# Patient Record
Sex: Female | Born: 1935 | Race: White | Hispanic: No | Marital: Married | Smoking: Never smoker
Health system: Southern US, Community
[De-identification: ages and names within clinical notes are randomized; demographics above are authoritative.]

## PROBLEM LIST (undated history)

## (undated) DIAGNOSIS — Z8679 Personal history of other diseases of the circulatory system: Secondary | ICD-10-CM

## (undated) DIAGNOSIS — M1710 Unilateral primary osteoarthritis, unspecified knee: Secondary | ICD-10-CM

## (undated) DIAGNOSIS — T451X5A Adverse effect of antineoplastic and immunosuppressive drugs, initial encounter: Secondary | ICD-10-CM

## (undated) DIAGNOSIS — Z86718 Personal history of other venous thrombosis and embolism: Secondary | ICD-10-CM

## (undated) DIAGNOSIS — M179 Osteoarthritis of knee, unspecified: Secondary | ICD-10-CM

## (undated) DIAGNOSIS — Z86010 Personal history of colonic polyps: Secondary | ICD-10-CM

## (undated) DIAGNOSIS — I10 Essential (primary) hypertension: Secondary | ICD-10-CM

## (undated) DIAGNOSIS — E785 Hyperlipidemia, unspecified: Secondary | ICD-10-CM

## (undated) DIAGNOSIS — I427 Cardiomyopathy due to drug and external agent: Secondary | ICD-10-CM

## (undated) DIAGNOSIS — I25710 Atherosclerosis of autologous vein coronary artery bypass graft(s) with unstable angina pectoris: Secondary | ICD-10-CM

## (undated) DIAGNOSIS — E039 Hypothyroidism, unspecified: Secondary | ICD-10-CM

## (undated) DIAGNOSIS — E1165 Type 2 diabetes mellitus with hyperglycemia: Secondary | ICD-10-CM

## (undated) DIAGNOSIS — Z853 Personal history of malignant neoplasm of breast: Secondary | ICD-10-CM

## (undated) DIAGNOSIS — I5042 Chronic combined systolic (congestive) and diastolic (congestive) heart failure: Secondary | ICD-10-CM

## (undated) DIAGNOSIS — Z8673 Personal history of transient ischemic attack (TIA), and cerebral infarction without residual deficits: Secondary | ICD-10-CM

## (undated) DIAGNOSIS — Z951 Presence of aortocoronary bypass graft: Secondary | ICD-10-CM

## (undated) DIAGNOSIS — I2510 Atherosclerotic heart disease of native coronary artery without angina pectoris: Secondary | ICD-10-CM

## (undated) DIAGNOSIS — Z9861 Coronary angioplasty status: Secondary | ICD-10-CM

## (undated) DIAGNOSIS — Z860101 Personal history of adenomatous and serrated colon polyps: Secondary | ICD-10-CM

## (undated) DIAGNOSIS — E118 Type 2 diabetes mellitus with unspecified complications: Secondary | ICD-10-CM

## (undated) DIAGNOSIS — F0390 Unspecified dementia without behavioral disturbance: Secondary | ICD-10-CM

## (undated) DIAGNOSIS — N182 Chronic kidney disease, stage 2 (mild): Secondary | ICD-10-CM

## (undated) DIAGNOSIS — IMO0002 Reserved for concepts with insufficient information to code with codable children: Secondary | ICD-10-CM

## (undated) HISTORY — DX: Cardiomyopathy due to drug and external agent: I42.7

## (undated) HISTORY — DX: Hypothyroidism, unspecified: E03.9

## (undated) HISTORY — DX: Presence of aortocoronary bypass graft: Z95.1

## (undated) HISTORY — DX: Osteoarthritis of knee, unspecified: M17.9

## (undated) HISTORY — PX: TOTAL KNEE ARTHROPLASTY: SHX125

## (undated) HISTORY — PX: LUMBAR DISC SURGERY: SHX700

## (undated) HISTORY — DX: Personal history of malignant neoplasm of breast: Z85.3

## (undated) HISTORY — DX: Personal history of other diseases of the circulatory system: Z86.79

## (undated) HISTORY — PX: TOTAL ABDOMINAL HYSTERECTOMY: SHX209

## (undated) HISTORY — DX: Hyperlipidemia, unspecified: E78.5

## (undated) HISTORY — DX: Personal history of colonic polyps: Z86.010

## (undated) HISTORY — DX: Personal history of adenomatous and serrated colon polyps: Z86.0101

## (undated) HISTORY — DX: Personal history of transient ischemic attack (TIA), and cerebral infarction without residual deficits: Z86.73

## (undated) HISTORY — DX: Chronic kidney disease, stage 2 (mild): N18.2

## (undated) HISTORY — DX: Unilateral primary osteoarthritis, unspecified knee: M17.10

## (undated) HISTORY — DX: Adverse effect of antineoplastic and immunosuppressive drugs, initial encounter: T45.1X5A

## (undated) HISTORY — DX: Personal history of other venous thrombosis and embolism: Z86.718

---

## 1998-07-08 ENCOUNTER — Ambulatory Visit (HOSPITAL_COMMUNITY): Admission: RE | Admit: 1998-07-08 | Discharge: 1998-07-08 | Payer: Self-pay | Admitting: Obstetrics and Gynecology

## 1998-07-10 ENCOUNTER — Other Ambulatory Visit: Admission: RE | Admit: 1998-07-10 | Discharge: 1998-07-10 | Payer: Self-pay | Admitting: Obstetrics and Gynecology

## 1998-11-21 HISTORY — PX: SIMPLE MASTECTOMY: SHX2409

## 1999-03-02 ENCOUNTER — Other Ambulatory Visit: Admission: RE | Admit: 1999-03-02 | Discharge: 1999-03-02 | Payer: Self-pay | Admitting: Obstetrics and Gynecology

## 1999-03-09 ENCOUNTER — Ambulatory Visit (HOSPITAL_COMMUNITY): Admission: RE | Admit: 1999-03-09 | Discharge: 1999-03-09 | Payer: Self-pay | Admitting: *Deleted

## 1999-04-02 ENCOUNTER — Ambulatory Visit (HOSPITAL_COMMUNITY): Admission: RE | Admit: 1999-04-02 | Discharge: 1999-04-02 | Payer: Self-pay | Admitting: Surgery

## 1999-04-14 ENCOUNTER — Encounter: Admission: RE | Admit: 1999-04-14 | Discharge: 1999-07-13 | Payer: Self-pay | Admitting: Radiation Oncology

## 1999-04-26 ENCOUNTER — Ambulatory Visit (HOSPITAL_BASED_OUTPATIENT_CLINIC_OR_DEPARTMENT_OTHER): Admission: RE | Admit: 1999-04-26 | Discharge: 1999-04-26 | Payer: Self-pay | Admitting: Surgery

## 1999-05-03 ENCOUNTER — Encounter: Payer: Self-pay | Admitting: Surgery

## 1999-05-03 ENCOUNTER — Ambulatory Visit (HOSPITAL_COMMUNITY): Admission: RE | Admit: 1999-05-03 | Discharge: 1999-05-03 | Payer: Self-pay | Admitting: Surgery

## 1999-11-01 ENCOUNTER — Encounter: Payer: Self-pay | Admitting: Surgery

## 1999-11-01 ENCOUNTER — Encounter: Admission: RE | Admit: 1999-11-01 | Discharge: 1999-11-01 | Payer: Self-pay | Admitting: Surgery

## 1999-11-13 ENCOUNTER — Encounter: Payer: Self-pay | Admitting: Surgery

## 1999-11-13 ENCOUNTER — Ambulatory Visit (HOSPITAL_COMMUNITY): Admission: RE | Admit: 1999-11-13 | Discharge: 1999-11-13 | Payer: Self-pay | Admitting: Surgery

## 2000-11-02 ENCOUNTER — Emergency Department (HOSPITAL_COMMUNITY): Admission: EM | Admit: 2000-11-02 | Discharge: 2000-11-03 | Payer: Self-pay | Admitting: Emergency Medicine

## 2000-11-03 ENCOUNTER — Encounter: Payer: Self-pay | Admitting: Emergency Medicine

## 2002-11-21 DIAGNOSIS — I2510 Atherosclerotic heart disease of native coronary artery without angina pectoris: Secondary | ICD-10-CM

## 2002-11-21 HISTORY — DX: Atherosclerotic heart disease of native coronary artery without angina pectoris: I25.10

## 2002-12-09 ENCOUNTER — Inpatient Hospital Stay (HOSPITAL_COMMUNITY): Admission: EM | Admit: 2002-12-09 | Discharge: 2002-12-17 | Payer: Self-pay | Admitting: Emergency Medicine

## 2002-12-09 ENCOUNTER — Encounter: Payer: Self-pay | Admitting: Emergency Medicine

## 2002-12-10 ENCOUNTER — Encounter: Payer: Self-pay | Admitting: Cardiology

## 2002-12-10 HISTORY — PX: CARDIAC CATHETERIZATION: SHX172

## 2002-12-14 ENCOUNTER — Encounter: Payer: Self-pay | Admitting: Internal Medicine

## 2002-12-22 ENCOUNTER — Encounter: Payer: Self-pay | Admitting: *Deleted

## 2002-12-22 ENCOUNTER — Inpatient Hospital Stay (HOSPITAL_COMMUNITY): Admission: EM | Admit: 2002-12-22 | Discharge: 2002-12-23 | Payer: Self-pay | Admitting: *Deleted

## 2003-01-14 ENCOUNTER — Ambulatory Visit (HOSPITAL_COMMUNITY): Admission: RE | Admit: 2003-01-14 | Discharge: 2003-01-16 | Payer: Self-pay | Admitting: Cardiology

## 2003-01-14 HISTORY — PX: CARDIAC CATHETERIZATION: SHX172

## 2003-06-22 DIAGNOSIS — Z951 Presence of aortocoronary bypass graft: Secondary | ICD-10-CM

## 2003-06-22 HISTORY — DX: Presence of aortocoronary bypass graft: Z95.1

## 2003-07-22 ENCOUNTER — Encounter: Payer: Self-pay | Admitting: Cardiothoracic Surgery

## 2003-07-22 HISTORY — PX: CARDIAC CATHETERIZATION: SHX172

## 2003-07-23 ENCOUNTER — Encounter: Payer: Self-pay | Admitting: Cardiothoracic Surgery

## 2003-07-23 ENCOUNTER — Inpatient Hospital Stay (HOSPITAL_COMMUNITY): Admission: AD | Admit: 2003-07-23 | Discharge: 2003-07-28 | Payer: Self-pay | Admitting: Cardiology

## 2003-07-23 DIAGNOSIS — Z86718 Personal history of other venous thrombosis and embolism: Secondary | ICD-10-CM

## 2003-07-23 HISTORY — PX: CORONARY ARTERY BYPASS GRAFT: SHX141

## 2003-07-23 HISTORY — DX: Personal history of other venous thrombosis and embolism: Z86.718

## 2003-07-24 ENCOUNTER — Encounter: Payer: Self-pay | Admitting: Cardiothoracic Surgery

## 2003-07-25 ENCOUNTER — Encounter: Payer: Self-pay | Admitting: Cardiothoracic Surgery

## 2003-07-26 ENCOUNTER — Encounter: Payer: Self-pay | Admitting: Cardiothoracic Surgery

## 2003-08-15 ENCOUNTER — Encounter: Admission: RE | Admit: 2003-08-15 | Discharge: 2003-08-15 | Payer: Self-pay | Admitting: Cardiothoracic Surgery

## 2003-08-15 ENCOUNTER — Encounter: Payer: Self-pay | Admitting: Cardiothoracic Surgery

## 2003-08-16 ENCOUNTER — Inpatient Hospital Stay (HOSPITAL_COMMUNITY): Admission: EM | Admit: 2003-08-16 | Discharge: 2003-08-17 | Payer: Self-pay | Admitting: Emergency Medicine

## 2004-04-12 ENCOUNTER — Ambulatory Visit (HOSPITAL_COMMUNITY): Admission: RE | Admit: 2004-04-12 | Discharge: 2004-04-12 | Payer: Self-pay | Admitting: Gastroenterology

## 2004-12-06 ENCOUNTER — Encounter: Admission: RE | Admit: 2004-12-06 | Discharge: 2004-12-06 | Payer: Self-pay | Admitting: Family Medicine

## 2004-12-29 ENCOUNTER — Inpatient Hospital Stay (HOSPITAL_COMMUNITY): Admission: RE | Admit: 2004-12-29 | Discharge: 2005-01-02 | Payer: Self-pay | Admitting: Orthopedic Surgery

## 2005-07-18 ENCOUNTER — Encounter: Admission: RE | Admit: 2005-07-18 | Discharge: 2005-07-18 | Payer: Self-pay | Admitting: Orthopedic Surgery

## 2005-11-23 ENCOUNTER — Inpatient Hospital Stay (HOSPITAL_COMMUNITY): Admission: RE | Admit: 2005-11-23 | Discharge: 2005-11-25 | Payer: Self-pay | Admitting: Orthopedic Surgery

## 2006-02-02 ENCOUNTER — Encounter: Admission: RE | Admit: 2006-02-02 | Discharge: 2006-02-02 | Payer: Self-pay | Admitting: Cardiology

## 2006-02-15 ENCOUNTER — Ambulatory Visit: Payer: Self-pay | Admitting: Emergency Medicine

## 2006-03-01 ENCOUNTER — Ambulatory Visit (HOSPITAL_COMMUNITY): Admission: RE | Admit: 2006-03-01 | Discharge: 2006-03-01 | Payer: Self-pay | Admitting: Cardiology

## 2006-03-01 HISTORY — PX: CARDIAC CATHETERIZATION: SHX172

## 2008-02-15 ENCOUNTER — Observation Stay (HOSPITAL_COMMUNITY): Admission: EM | Admit: 2008-02-15 | Discharge: 2008-02-16 | Payer: Self-pay | Admitting: Emergency Medicine

## 2008-02-19 HISTORY — PX: CARDIAC CATHETERIZATION: SHX172

## 2008-11-21 DIAGNOSIS — Z8679 Personal history of other diseases of the circulatory system: Secondary | ICD-10-CM

## 2008-11-21 HISTORY — DX: Personal history of other diseases of the circulatory system: Z86.79

## 2009-04-26 ENCOUNTER — Inpatient Hospital Stay (HOSPITAL_COMMUNITY): Admission: EM | Admit: 2009-04-26 | Discharge: 2009-04-27 | Payer: Self-pay | Admitting: Emergency Medicine

## 2009-04-27 ENCOUNTER — Encounter (INDEPENDENT_AMBULATORY_CARE_PROVIDER_SITE_OTHER): Payer: Self-pay | Admitting: Internal Medicine

## 2009-07-07 ENCOUNTER — Inpatient Hospital Stay (HOSPITAL_COMMUNITY): Admission: EM | Admit: 2009-07-07 | Discharge: 2009-07-08 | Payer: Self-pay | Admitting: Emergency Medicine

## 2009-07-22 DIAGNOSIS — I5042 Chronic combined systolic (congestive) and diastolic (congestive) heart failure: Secondary | ICD-10-CM

## 2009-07-22 DIAGNOSIS — T451X5A Adverse effect of antineoplastic and immunosuppressive drugs, initial encounter: Secondary | ICD-10-CM

## 2009-07-22 DIAGNOSIS — I427 Cardiomyopathy due to drug and external agent: Secondary | ICD-10-CM

## 2009-07-22 HISTORY — DX: Cardiomyopathy due to drug and external agent: I42.7

## 2009-07-22 HISTORY — DX: Chronic combined systolic (congestive) and diastolic (congestive) heart failure: I50.42

## 2009-07-22 HISTORY — DX: Adverse effect of antineoplastic and immunosuppressive drugs, initial encounter: T45.1X5A

## 2009-08-18 ENCOUNTER — Inpatient Hospital Stay (HOSPITAL_COMMUNITY): Admission: EM | Admit: 2009-08-18 | Discharge: 2009-08-20 | Payer: Self-pay | Admitting: Emergency Medicine

## 2009-08-19 HISTORY — PX: CARDIAC CATHETERIZATION: SHX172

## 2009-09-30 HISTORY — PX: CARDIOVASCULAR STRESS TEST: SHX262

## 2009-10-05 ENCOUNTER — Inpatient Hospital Stay (HOSPITAL_COMMUNITY): Admission: RE | Admit: 2009-10-05 | Discharge: 2009-10-06 | Payer: Self-pay | Admitting: Cardiovascular Disease

## 2009-10-05 HISTORY — PX: RENAL ARTERY ANGIOPLASTY: SHX2317

## 2009-12-11 ENCOUNTER — Inpatient Hospital Stay (HOSPITAL_COMMUNITY): Admission: EM | Admit: 2009-12-11 | Discharge: 2009-12-13 | Payer: Self-pay | Admitting: Emergency Medicine

## 2009-12-12 ENCOUNTER — Encounter (INDEPENDENT_AMBULATORY_CARE_PROVIDER_SITE_OTHER): Payer: Self-pay | Admitting: Internal Medicine

## 2010-03-03 ENCOUNTER — Inpatient Hospital Stay (HOSPITAL_COMMUNITY): Admission: RE | Admit: 2010-03-03 | Discharge: 2010-03-04 | Payer: Self-pay | Admitting: General Surgery

## 2010-03-03 ENCOUNTER — Encounter (INDEPENDENT_AMBULATORY_CARE_PROVIDER_SITE_OTHER): Payer: Self-pay | Admitting: General Surgery

## 2010-03-16 ENCOUNTER — Ambulatory Visit: Payer: Self-pay | Admitting: Oncology

## 2010-03-24 LAB — CBC WITH DIFFERENTIAL/PLATELET
Basophils Absolute: 0 10*3/uL (ref 0.0–0.1)
Eosinophils Absolute: 0.1 10*3/uL (ref 0.0–0.5)
HCT: 33.8 % — ABNORMAL LOW (ref 34.8–46.6)
HGB: 11.5 g/dL — ABNORMAL LOW (ref 11.6–15.9)
MCHC: 33.9 g/dL (ref 31.5–36.0)
MCV: 91.1 fL (ref 79.5–101.0)
MONO%: 9.1 % (ref 0.0–14.0)
NEUT%: 69.6 % (ref 38.4–76.8)
RBC: 3.72 10*6/uL (ref 3.70–5.45)
lymph#: 1.2 10*3/uL (ref 0.9–3.3)

## 2010-03-24 LAB — COMPREHENSIVE METABOLIC PANEL
ALT: 8 U/L (ref 0–35)
AST: 12 U/L (ref 0–37)
Alkaline Phosphatase: 83 U/L (ref 39–117)
Creatinine, Ser: 1.41 mg/dL — ABNORMAL HIGH (ref 0.40–1.20)
Sodium: 138 mEq/L (ref 135–145)
Total Bilirubin: 0.3 mg/dL (ref 0.3–1.2)
Total Protein: 7.1 g/dL (ref 6.0–8.3)

## 2010-03-30 ENCOUNTER — Ambulatory Visit (HOSPITAL_COMMUNITY): Admission: RE | Admit: 2010-03-30 | Discharge: 2010-03-30 | Payer: Self-pay | Admitting: Oncology

## 2010-04-07 ENCOUNTER — Ambulatory Visit (HOSPITAL_BASED_OUTPATIENT_CLINIC_OR_DEPARTMENT_OTHER): Admission: RE | Admit: 2010-04-07 | Discharge: 2010-04-07 | Payer: Self-pay | Admitting: General Surgery

## 2010-04-12 LAB — CBC WITH DIFFERENTIAL/PLATELET
BASO%: 0.7 % (ref 0.0–2.0)
HCT: 36.5 % (ref 34.8–46.6)
LYMPH%: 19.1 % (ref 14.0–49.7)
MCHC: 32.9 g/dL (ref 31.5–36.0)
MCV: 90.6 fL (ref 79.5–101.0)
MONO#: 0.6 10*3/uL (ref 0.1–0.9)
MONO%: 7.3 % (ref 0.0–14.0)
NEUT%: 71.1 % (ref 38.4–76.8)
Platelets: 188 10*3/uL (ref 145–400)
RBC: 4.03 10*6/uL (ref 3.70–5.45)
nRBC: 0 % (ref 0–0)

## 2010-04-12 LAB — COMPREHENSIVE METABOLIC PANEL
ALT: 8 U/L (ref 0–35)
AST: 13 U/L (ref 0–37)
BUN: 27 mg/dL — ABNORMAL HIGH (ref 6–23)
CO2: 18 mEq/L — ABNORMAL LOW (ref 19–32)
Calcium: 8.9 mg/dL (ref 8.4–10.5)
Chloride: 106 mEq/L (ref 96–112)
Creatinine, Ser: 1.25 mg/dL — ABNORMAL HIGH (ref 0.40–1.20)
Total Bilirubin: 0.3 mg/dL (ref 0.3–1.2)

## 2010-04-29 ENCOUNTER — Ambulatory Visit: Payer: Self-pay | Admitting: Oncology

## 2010-05-03 LAB — COMPREHENSIVE METABOLIC PANEL
AST: 11 U/L (ref 0–37)
Albumin: 3.9 g/dL (ref 3.5–5.2)
Alkaline Phosphatase: 95 U/L (ref 39–117)
Chloride: 109 mEq/L (ref 96–112)
Creatinine, Ser: 1.22 mg/dL — ABNORMAL HIGH (ref 0.40–1.20)
Glucose, Bld: 126 mg/dL — ABNORMAL HIGH (ref 70–99)
Sodium: 141 mEq/L (ref 135–145)
Total Bilirubin: 0.4 mg/dL (ref 0.3–1.2)
Total Protein: 6.7 g/dL (ref 6.0–8.3)

## 2010-05-03 LAB — CBC WITH DIFFERENTIAL/PLATELET
Basophils Absolute: 0.1 10*3/uL (ref 0.0–0.1)
HCT: 35.1 % (ref 34.8–46.6)
MCH: 29.9 pg (ref 25.1–34.0)
MCHC: 33.3 g/dL (ref 31.5–36.0)
MCV: 89.8 fL (ref 79.5–101.0)
NEUT#: 7.7 10*3/uL — ABNORMAL HIGH (ref 1.5–6.5)
NEUT%: 74.3 % (ref 38.4–76.8)
RBC: 3.91 10*6/uL (ref 3.70–5.45)
RDW: 15.3 % — ABNORMAL HIGH (ref 11.2–14.5)
lymph#: 1.5 10*3/uL (ref 0.9–3.3)
nRBC: 0 % (ref 0–0)

## 2010-05-03 LAB — MAGNESIUM: Magnesium: 1.6 mg/dL (ref 1.5–2.5)

## 2010-05-26 LAB — MAGNESIUM: Magnesium: 1.8 mg/dL (ref 1.5–2.5)

## 2010-05-26 LAB — CBC WITH DIFFERENTIAL/PLATELET
BASO%: 0.8 % (ref 0.0–2.0)
Basophils Absolute: 0.1 10*3/uL (ref 0.0–0.1)
HCT: 33.1 % — ABNORMAL LOW (ref 34.8–46.6)
HGB: 10.8 g/dL — ABNORMAL LOW (ref 11.6–15.9)
MCH: 29.9 pg (ref 25.1–34.0)
MCV: 91.7 fL (ref 79.5–101.0)
MONO#: 0.9 10*3/uL (ref 0.1–0.9)
Platelets: 283 10*3/uL (ref 145–400)
RDW: 16.7 % — ABNORMAL HIGH (ref 11.2–14.5)
WBC: 10.1 10*3/uL (ref 3.9–10.3)

## 2010-05-26 LAB — COMPREHENSIVE METABOLIC PANEL
ALT: 9 U/L (ref 0–35)
AST: 12 U/L (ref 0–37)
Albumin: 4 g/dL (ref 3.5–5.2)
Alkaline Phosphatase: 98 U/L (ref 39–117)
Calcium: 9 mg/dL (ref 8.4–10.5)
Potassium: 4.4 mEq/L (ref 3.5–5.3)
Total Bilirubin: 0.4 mg/dL (ref 0.3–1.2)
Total Protein: 6.6 g/dL (ref 6.0–8.3)

## 2010-06-14 ENCOUNTER — Ambulatory Visit: Payer: Self-pay | Admitting: Oncology

## 2010-06-16 LAB — CBC WITH DIFFERENTIAL/PLATELET
BASO%: 0.4 % (ref 0.0–2.0)
EOS%: 0.4 % (ref 0.0–7.0)
HCT: 31.1 % — ABNORMAL LOW (ref 34.8–46.6)
LYMPH%: 11 % — ABNORMAL LOW (ref 14.0–49.7)
MCH: 29.3 pg (ref 25.1–34.0)
MCHC: 31.8 g/dL (ref 31.5–36.0)
MCV: 92 fL (ref 79.5–101.0)
MONO%: 8.9 % (ref 0.0–14.0)
NEUT#: 7.5 10*3/uL — ABNORMAL HIGH (ref 1.5–6.5)
Platelets: 231 10*3/uL (ref 145–400)
RBC: 3.38 10*6/uL — ABNORMAL LOW (ref 3.70–5.45)

## 2010-06-16 LAB — COMPREHENSIVE METABOLIC PANEL
Alkaline Phosphatase: 96 U/L (ref 39–117)
BUN: 19 mg/dL (ref 6–23)
CO2: 19 mEq/L (ref 19–32)
Creatinine, Ser: 1.19 mg/dL (ref 0.40–1.20)
Potassium: 4.6 mEq/L (ref 3.5–5.3)
Sodium: 142 mEq/L (ref 135–145)
Total Bilirubin: 0.4 mg/dL (ref 0.3–1.2)

## 2010-07-07 LAB — CBC WITH DIFFERENTIAL/PLATELET
Basophils Absolute: 0.1 10*3/uL (ref 0.0–0.1)
EOS%: 0.5 % (ref 0.0–7.0)
HGB: 10.2 g/dL — ABNORMAL LOW (ref 11.6–15.9)
LYMPH%: 13.3 % — ABNORMAL LOW (ref 14.0–49.7)
MCH: 29.9 pg (ref 25.1–34.0)
MCV: 95.3 fL (ref 79.5–101.0)
MONO%: 11 % (ref 0.0–14.0)
NEUT#: 6 10*3/uL (ref 1.5–6.5)
NEUT%: 74.6 % (ref 38.4–76.8)
RBC: 3.41 10*6/uL — ABNORMAL LOW (ref 3.70–5.45)
RDW: 17.1 % — ABNORMAL HIGH (ref 11.2–14.5)
lymph#: 1.1 10*3/uL (ref 0.9–3.3)

## 2010-07-23 ENCOUNTER — Ambulatory Visit: Payer: Self-pay | Admitting: Oncology

## 2010-07-28 LAB — COMPREHENSIVE METABOLIC PANEL
Albumin: 4.1 g/dL (ref 3.5–5.2)
CO2: 22 mEq/L (ref 19–32)
Potassium: 4.6 mEq/L (ref 3.5–5.3)
Sodium: 141 mEq/L (ref 135–145)

## 2010-07-28 LAB — CBC WITH DIFFERENTIAL/PLATELET
BASO%: 0.3 % (ref 0.0–2.0)
Basophils Absolute: 0 10*3/uL (ref 0.0–0.1)
EOS%: 3 % (ref 0.0–7.0)
HCT: 33.7 % — ABNORMAL LOW (ref 34.8–46.6)
MCV: 93.4 fL (ref 79.5–101.0)
NEUT#: 5.4 10*3/uL (ref 1.5–6.5)
NEUT%: 74.5 % (ref 38.4–76.8)
WBC: 7.3 10*3/uL (ref 3.9–10.3)
lymph#: 1.1 10*3/uL (ref 0.9–3.3)
nRBC: 0 % (ref 0–0)

## 2010-08-18 LAB — CBC WITH DIFFERENTIAL/PLATELET
Eosinophils Absolute: 0.1 10*3/uL (ref 0.0–0.5)
HCT: 32.5 % — ABNORMAL LOW (ref 34.8–46.6)
HGB: 10.4 g/dL — ABNORMAL LOW (ref 11.6–15.9)
MCH: 29.8 pg (ref 25.1–34.0)
MCV: 93.1 fL (ref 79.5–101.0)
MONO#: 0.9 10*3/uL (ref 0.1–0.9)
NEUT%: 77.1 % — ABNORMAL HIGH (ref 38.4–76.8)
WBC: 9.4 10*3/uL (ref 3.9–10.3)
lymph#: 1.2 10*3/uL (ref 0.9–3.3)

## 2010-08-18 LAB — COMPREHENSIVE METABOLIC PANEL
ALT: 10 U/L (ref 0–35)
Alkaline Phosphatase: 91 U/L (ref 39–117)
BUN: 17 mg/dL (ref 6–23)
CO2: 20 mEq/L (ref 19–32)
Calcium: 8.9 mg/dL (ref 8.4–10.5)
Chloride: 113 mEq/L — ABNORMAL HIGH (ref 96–112)
Glucose, Bld: 99 mg/dL (ref 70–99)
Potassium: 4.9 mEq/L (ref 3.5–5.3)
Sodium: 142 mEq/L (ref 135–145)
Total Bilirubin: 0.3 mg/dL (ref 0.3–1.2)

## 2010-08-31 ENCOUNTER — Ambulatory Visit: Admission: RE | Admit: 2010-08-31 | Discharge: 2010-08-31 | Payer: Self-pay | Admitting: Oncology

## 2010-09-06 ENCOUNTER — Ambulatory Visit: Payer: Self-pay | Admitting: Oncology

## 2010-09-08 LAB — COMPREHENSIVE METABOLIC PANEL
ALT: 8 U/L (ref 0–35)
AST: 12 U/L (ref 0–37)
Albumin: 4 g/dL (ref 3.5–5.2)
Alkaline Phosphatase: 94 U/L (ref 39–117)
Calcium: 9.5 mg/dL (ref 8.4–10.5)
Creatinine, Ser: 1.17 mg/dL (ref 0.40–1.20)
Glucose, Bld: 124 mg/dL — ABNORMAL HIGH (ref 70–99)
Sodium: 141 mEq/L (ref 135–145)
Total Bilirubin: 0.4 mg/dL (ref 0.3–1.2)
Total Protein: 6.1 g/dL (ref 6.0–8.3)

## 2010-09-08 LAB — CBC WITH DIFFERENTIAL/PLATELET
HCT: 29.5 % — ABNORMAL LOW (ref 34.8–46.6)
MCH: 31.5 pg (ref 25.1–34.0)
MCV: 91.1 fL (ref 79.5–101.0)
MONO#: 0.8 10*3/uL (ref 0.1–0.9)
NEUT#: 6 10*3/uL (ref 1.5–6.5)
Platelets: 266 10*3/uL (ref 145–400)
RBC: 3.24 10*6/uL — ABNORMAL LOW (ref 3.70–5.45)
RDW: 16.3 % — ABNORMAL HIGH (ref 11.2–14.5)

## 2010-09-29 ENCOUNTER — Encounter (INDEPENDENT_AMBULATORY_CARE_PROVIDER_SITE_OTHER): Payer: Self-pay | Admitting: General Surgery

## 2010-09-29 ENCOUNTER — Ambulatory Visit (HOSPITAL_COMMUNITY): Admission: RE | Admit: 2010-09-29 | Discharge: 2010-09-30 | Payer: Self-pay | Admitting: General Surgery

## 2010-10-21 ENCOUNTER — Ambulatory Visit: Payer: Self-pay | Admitting: Oncology

## 2010-10-22 LAB — COMPREHENSIVE METABOLIC PANEL
ALT: 13 U/L (ref 0–35)
BUN: 22 mg/dL (ref 6–23)
CO2: 24 mEq/L (ref 19–32)
Calcium: 8.6 mg/dL (ref 8.4–10.5)
Chloride: 105 mEq/L (ref 96–112)
Creatinine, Ser: 1.16 mg/dL (ref 0.40–1.20)
Glucose, Bld: 190 mg/dL — ABNORMAL HIGH (ref 70–99)
Total Bilirubin: 0.4 mg/dL (ref 0.3–1.2)

## 2010-10-22 LAB — CBC WITH DIFFERENTIAL/PLATELET
Basophils Absolute: 0 10*3/uL (ref 0.0–0.1)
Eosinophils Absolute: 0.1 10*3/uL (ref 0.0–0.5)
HCT: 34.1 % — ABNORMAL LOW (ref 34.8–46.6)
HGB: 10.9 g/dL — ABNORMAL LOW (ref 11.6–15.9)
LYMPH%: 13.9 % — ABNORMAL LOW (ref 14.0–49.7)
MONO#: 0.5 10*3/uL (ref 0.1–0.9)
NEUT%: 76.4 % (ref 38.4–76.8)
Platelets: 209 10*3/uL (ref 145–400)
WBC: 6.1 10*3/uL (ref 3.9–10.3)
lymph#: 0.8 10*3/uL — ABNORMAL LOW (ref 0.9–3.3)

## 2010-10-22 LAB — MAGNESIUM: Magnesium: 1.6 mg/dL (ref 1.5–2.5)

## 2010-11-19 ENCOUNTER — Emergency Department (HOSPITAL_COMMUNITY)
Admission: EM | Admit: 2010-11-19 | Discharge: 2010-11-19 | Payer: Self-pay | Source: Home / Self Care | Admitting: Emergency Medicine

## 2010-11-25 ENCOUNTER — Ambulatory Visit (HOSPITAL_COMMUNITY)
Admission: RE | Admit: 2010-11-25 | Discharge: 2010-11-25 | Payer: Self-pay | Source: Home / Self Care | Attending: Cardiology | Admitting: Cardiology

## 2010-11-25 HISTORY — PX: CARDIAC CATHETERIZATION: SHX172

## 2010-11-25 LAB — GLUCOSE, CAPILLARY
Glucose-Capillary: 159 mg/dL — ABNORMAL HIGH (ref 70–99)
Glucose-Capillary: 126 mg/dL — ABNORMAL HIGH (ref 70–99)
Glucose-Capillary: 102 mg/dL — ABNORMAL HIGH (ref 70–99)
Glucose-Capillary: 246 mg/dL — ABNORMAL HIGH (ref 70–99)

## 2010-12-09 NOTE — Procedures (Signed)
[name], [name]                ACCOUNT NO.:  [id number]  MEDICAL RECORD NO.:  [id number]          PATIENT TYPE:  OIB  LOCATION:  2899                         FACILITY:  [facility]  PHYSICIAN:  [name], M.D. DATE OF BIRTH:  February 28, 1935  DATE OF PROCEDURE:  11/25/2010 DATE OF DISCHARGE:  11/25/2010                           CARDIAC CATHETERIZATION   INDICATIONS FOR TEST:  This 76 year old female has had bypass surgery and she has had a recent mastectomy and treatment with Adriamycin for breast cancer.  The treatment was terminated prematurely because of drop in the ejection fraction.  She had seen me on several occasions since mid November because of profound tiredness and fatigue and some atypical chest pain that sounded more surgical related but then presented to the emergency room 5 days ago with recurrent chest pain.  Her cardiac markers were negative and she was brought in today for outpatient cardiac catheterization with graft visualization.  After obtaining informed consent, the patient was prepped and draped in the usual sterile fashion exposing the right groin.  Following local anesthetic with 1% Xylocaine, the Seldinger technique was employed.  A 5- French introducer sheath was placed in the right femoral artery.  Left and right coronary arteriography graft visualization and ventriculography in the RAO projection was performed.  Her creatinine was 1.6.  She has a history of mild renal insufficiency and because of this minimal contrast was used.  I did, however, perform a ventriculogram to try to resolve the LV function issue.  EQUIPMENT:  A 5-French Judkins configuration catheters.  TOTAL CONTRAST USED:  100 mL.  COMPLICATIONS:  None.  RESULTS: 1. Central aortic pressure 137/61.  Left ventricular pressure 139/7     with no aortic valve gradient at the time of pullback. 2. Ventriculography.  Ventriculography in the RAO projection using 20     mL of contrast at  12 mL per second revealed the left ventricle to     be slightly dilated.  The anterior wall and the distal inferior     wall were hypokinetic.  The apex was underfilled but did have good     contractility.  Her left ventricular end-diastolic pressure was 10     and her ejection fraction was calculated at 40%. 3. Coronary arteriography: 4. Left main normal and bifurcated. 5. Circumflex.  This is a non grafted vessel that was small.  It had     mild proximal and mid irregularities and gave rise to a very small     OMs. 6. LAD.  There is a stent of proximal portion of the LAD.  There is     eccentric 90% narrowing of the proximal portion of the stent with     brisk distal flow and retrograde flow up the internal mammary     artery.  The distal LAD was small in diameter.  In 2010, her cath     showed this stent to be 100% occluded.  It has recanalized on its     own. 7. Right coronary artery.  The proximal third of the right coronary     artery was diffusely  diseased with 60, 70, and 80% areas of     narrowing.  The distal right, the PDA, and posterolateral vessels     were free of disease and there was retrograde filling into the     saphenous vein graft to the RCA.  GRAFTS: 1. Saphenous vein graft to the distal RCA had proximal and ostial     irregularities with a valve in the proximal portion of the graft.     The remainder of the graft was free of disease.  The distal right,     the PDA, and posterolateral vessels were all free of disease and     there was some frank right-to-left collaterals noted. 2. Saphenous vein graft to the diagonal.  The vein graft was patent.     The diagonal was small but free of disease. 3. Internal mammary artery to the LAD.  The LAD was widely patent.  It     inserted into the mid LAD just distal to the stent.  The mid and     distal LAD was small but no high-grade stenoses.  CONCLUSION: 1. 40% ejection fraction with anterior and distal inferior wall  motion     abnormalities.  In 2010, her ejection fraction was normal and I     assume this is Adriamycin induced. 2. Patent grafts. 3. Progression of diagonal and LAD disease, predominantly small vessel     disease consistent with her diabetes.  Despite the high-grade stenosis in the proximal portion of the LAD that is within a stent.  She has excellent flow down her internal mammary artery.  The stent had been closed previously and I do not think that attempts of opening this stent up would make any change in her symptoms and may in fact damage her internal mammary artery graft.  She will be discharged to home today.  I will follow her up in the office in about 10 days.          ______________________________ [name], M.D.     ABL/MEDQ  D:  11/25/2010  T:  11/26/2010  Job:  [id number]  cc:   Crystal Herbert, MD Cath Lab  Electronically Signed by [name] M.D. on 12/09/2010 02:32:08 PM

## 2011-01-25 ENCOUNTER — Encounter (HOSPITAL_BASED_OUTPATIENT_CLINIC_OR_DEPARTMENT_OTHER): Payer: Medicare Other | Admitting: Oncology

## 2011-01-25 ENCOUNTER — Other Ambulatory Visit: Payer: Self-pay | Admitting: Medical

## 2011-01-25 DIAGNOSIS — E039 Hypothyroidism, unspecified: Secondary | ICD-10-CM

## 2011-01-25 DIAGNOSIS — E119 Type 2 diabetes mellitus without complications: Secondary | ICD-10-CM

## 2011-01-25 DIAGNOSIS — C50419 Malignant neoplasm of upper-outer quadrant of unspecified female breast: Secondary | ICD-10-CM

## 2011-01-25 LAB — CBC WITH DIFFERENTIAL/PLATELET
Basophils Absolute: 0 10*3/uL (ref 0.0–0.1)
EOS%: 2.7 % (ref 0.0–7.0)
HGB: 10.9 g/dL — ABNORMAL LOW (ref 11.6–15.9)
MCH: 30.7 pg (ref 25.1–34.0)
MONO#: 0.6 10*3/uL (ref 0.1–0.9)
NEUT#: 3.8 10*3/uL (ref 1.5–6.5)
RDW: 15.3 % — ABNORMAL HIGH (ref 11.2–14.5)
WBC: 5.8 10*3/uL (ref 3.9–10.3)
lymph#: 1.2 10*3/uL (ref 0.9–3.3)

## 2011-01-25 LAB — COMPREHENSIVE METABOLIC PANEL
ALT: 10 U/L (ref 0–35)
AST: 12 U/L (ref 0–37)
Albumin: 4.2 g/dL (ref 3.5–5.2)
BUN: 22 mg/dL (ref 6–23)
CO2: 20 mEq/L (ref 19–32)
Calcium: 9.6 mg/dL (ref 8.4–10.5)
Chloride: 110 mEq/L (ref 96–112)
Potassium: 5.6 mEq/L — ABNORMAL HIGH (ref 3.5–5.3)

## 2011-01-31 LAB — URINALYSIS, ROUTINE W REFLEX MICROSCOPIC
Bilirubin Urine: NEGATIVE
Ketones, ur: NEGATIVE mg/dL
Nitrite: NEGATIVE
Specific Gravity, Urine: 1.011 (ref 1.005–1.030)
Urobilinogen, UA: 0.2 mg/dL (ref 0.0–1.0)

## 2011-01-31 LAB — COMPREHENSIVE METABOLIC PANEL
BUN: 33 mg/dL — ABNORMAL HIGH (ref 6–23)
CO2: 24 mEq/L (ref 19–32)
Calcium: 9.9 mg/dL (ref 8.4–10.5)
Creatinine, Ser: 1.63 mg/dL — ABNORMAL HIGH (ref 0.4–1.2)
GFR calc non Af Amer: 31 mL/min — ABNORMAL LOW (ref >60)
Glucose, Bld: 112 mg/dL — ABNORMAL HIGH (ref 70–99)
Total Protein: 7.1 g/dL (ref 6.0–8.3)

## 2011-01-31 LAB — DIFFERENTIAL
Eosinophils Absolute: 0.1 10*3/uL (ref 0.0–0.7)
Lymphocytes Relative: 16 % (ref 12–46)
Lymphs Abs: 1.1 10*3/uL (ref 0.7–4.0)
Monocytes Relative: 7 % (ref 3–12)
Neutro Abs: 5.4 10*3/uL (ref 1.7–7.7)
Neutrophils Relative %: 75 % (ref 43–77)

## 2011-01-31 LAB — APTT: aPTT: 26 seconds (ref 24–37)

## 2011-01-31 LAB — CBC
HCT: 36.1 % (ref 36.0–46.0)
Hemoglobin: 11.8 g/dL — ABNORMAL LOW (ref 12.0–15.0)
MCH: 30 pg (ref 26.0–34.0)
MCHC: 32.7 g/dL (ref 30.0–36.0)
RDW: 14.9 % (ref 11.5–15.5)

## 2011-01-31 LAB — PROTIME-INR
INR: 0.97 (ref 0.00–1.49)
Prothrombin Time: 13.1 seconds (ref 11.6–15.2)

## 2011-01-31 LAB — CK TOTAL AND CKMB (NOT AT ARMC)
Relative Index: INVALID (ref 0.0–2.5)
Total CK: 61 U/L (ref 7–177)
Total CK: 66 U/L (ref 7–177)

## 2011-01-31 LAB — BRAIN NATRIURETIC PEPTIDE: Pro B Natriuretic peptide (BNP): 78 pg/mL (ref 0.0–100.0)

## 2011-02-01 LAB — BASIC METABOLIC PANEL
CO2: 26 mEq/L (ref 19–32)
Calcium: 9.8 mg/dL (ref 8.4–10.5)
GFR calc Af Amer: 42 mL/min — ABNORMAL LOW (ref >60)
Potassium: 4.9 mEq/L (ref 3.5–5.1)
Sodium: 141 mEq/L (ref 135–145)

## 2011-02-01 LAB — CBC
HCT: 34.8 % — ABNORMAL LOW (ref 36.0–46.0)
Hemoglobin: 11.6 g/dL — ABNORMAL LOW (ref 12.0–15.0)
MCH: 30 pg (ref 26.0–34.0)
RBC: 3.85 MIL/uL — ABNORMAL LOW (ref 3.87–5.11)

## 2011-02-01 LAB — GLUCOSE, CAPILLARY
Glucose-Capillary: 102 mg/dL — ABNORMAL HIGH (ref 70–99)
Glucose-Capillary: 102 mg/dL — ABNORMAL HIGH (ref 70–99)
Glucose-Capillary: 116 mg/dL — ABNORMAL HIGH (ref 70–99)
Glucose-Capillary: 171 mg/dL — ABNORMAL HIGH (ref 70–99)
Glucose-Capillary: 58 mg/dL — ABNORMAL LOW (ref 70–99)
Glucose-Capillary: 82 mg/dL (ref 70–99)

## 2011-02-01 LAB — DIFFERENTIAL
Lymphs Abs: 1.1 10*3/uL (ref 0.7–4.0)
Monocytes Relative: 8 % (ref 3–12)
Neutro Abs: 5.5 10*3/uL (ref 1.7–7.7)
Neutrophils Relative %: 74 % (ref 43–77)

## 2011-02-01 LAB — SURGICAL PCR SCREEN: MRSA, PCR: NEGATIVE

## 2011-02-06 LAB — BASIC METABOLIC PANEL WITH GFR
Calcium: 9.5 mg/dL (ref 8.4–10.5)
Glucose, Bld: 231 mg/dL — ABNORMAL HIGH (ref 70–99)
Potassium: 4.3 meq/L (ref 3.5–5.1)
Sodium: 138 meq/L (ref 135–145)

## 2011-02-06 LAB — CBC
HCT: 37.5 % (ref 36.0–46.0)
Hemoglobin: 12.8 g/dL (ref 12.0–15.0)
Hemoglobin: 13 g/dL (ref 12.0–15.0)
MCHC: 34 g/dL (ref 30.0–36.0)
MCHC: 34.7 g/dL (ref 30.0–36.0)
MCV: 92.1 fL (ref 78.0–100.0)
Platelets: 200 K/uL (ref 150–400)
RBC: 4.07 MIL/uL (ref 3.87–5.11)
RBC: 4.08 MIL/uL (ref 3.87–5.11)
RDW: 12.9 % (ref 11.5–15.5)
WBC: 6.2 K/uL (ref 4.0–10.5)

## 2011-02-06 LAB — BASIC METABOLIC PANEL
BUN: 19 mg/dL (ref 6–23)
BUN: 30 mg/dL — ABNORMAL HIGH (ref 6–23)
CO2: 20 mEq/L (ref 19–32)
CO2: 22 mEq/L (ref 19–32)
Calcium: 8.5 mg/dL (ref 8.4–10.5)
Chloride: 106 mEq/L (ref 96–112)
Creatinine, Ser: 1.22 mg/dL — ABNORMAL HIGH (ref 0.4–1.2)
Creatinine, Ser: 1.4 mg/dL — ABNORMAL HIGH (ref 0.4–1.2)
GFR calc Af Amer: 52 mL/min — ABNORMAL LOW (ref >60)

## 2011-02-06 LAB — COMPREHENSIVE METABOLIC PANEL
ALT: 14 U/L (ref 0–35)
Alkaline Phosphatase: 60 U/L (ref 39–117)
CO2: 22 mEq/L (ref 19–32)
GFR calc non Af Amer: 45 mL/min — ABNORMAL LOW (ref >60)
Glucose, Bld: 128 mg/dL — ABNORMAL HIGH (ref 70–99)
Potassium: 4.4 mEq/L (ref 3.5–5.1)
Sodium: 144 mEq/L (ref 135–145)
Total Bilirubin: 0.7 mg/dL (ref 0.3–1.2)

## 2011-02-06 LAB — URINALYSIS, ROUTINE W REFLEX MICROSCOPIC
Bilirubin Urine: NEGATIVE
Glucose, UA: 250 mg/dL — AB
Hgb urine dipstick: NEGATIVE
Ketones, ur: NEGATIVE mg/dL
Nitrite: NEGATIVE
Protein, ur: NEGATIVE mg/dL
Specific Gravity, Urine: 1.013 (ref 1.005–1.030)
Urobilinogen, UA: 0.2 mg/dL (ref 0.0–1.0)
pH: 5 (ref 5.0–8.0)

## 2011-02-06 LAB — APTT
aPTT: 26 seconds (ref 24–37)
aPTT: 27 s (ref 24–37)

## 2011-02-06 LAB — CK TOTAL AND CKMB (NOT AT ARMC)
CK, MB: 5.5 ng/mL — ABNORMAL HIGH (ref 0.3–4.0)
Relative Index: INVALID (ref 0.0–2.5)
Total CK: 82 U/L (ref 7–177)

## 2011-02-06 LAB — DIFFERENTIAL
Basophils Absolute: 0.1 10*3/uL (ref 0.0–0.1)
Basophils Relative: 1 % (ref 0–1)
Eosinophils Absolute: 0.1 K/uL (ref 0.0–0.7)
Eosinophils Relative: 2 % (ref 0–5)
Lymphocytes Relative: 22 % (ref 12–46)
Lymphs Abs: 1.4 K/uL (ref 0.7–4.0)
Monocytes Absolute: 0.5 K/uL (ref 0.1–1.0)
Monocytes Relative: 8 % (ref 3–12)
Neutro Abs: 4.2 K/uL (ref 1.7–7.7)
Neutrophils Relative %: 67 % (ref 43–77)

## 2011-02-06 LAB — GLUCOSE, CAPILLARY
Glucose-Capillary: 121 mg/dL — ABNORMAL HIGH (ref 70–99)
Glucose-Capillary: 122 mg/dL — ABNORMAL HIGH (ref 70–99)
Glucose-Capillary: 122 mg/dL — ABNORMAL HIGH (ref 70–99)
Glucose-Capillary: 123 mg/dL — ABNORMAL HIGH (ref 70–99)
Glucose-Capillary: 123 mg/dL — ABNORMAL HIGH (ref 70–99)
Glucose-Capillary: 132 mg/dL — ABNORMAL HIGH (ref 70–99)
Glucose-Capillary: 145 mg/dL — ABNORMAL HIGH (ref 70–99)
Glucose-Capillary: 149 mg/dL — ABNORMAL HIGH (ref 70–99)
Glucose-Capillary: 202 mg/dL — ABNORMAL HIGH (ref 70–99)
Glucose-Capillary: 330 mg/dL — ABNORMAL HIGH (ref 70–99)

## 2011-02-06 LAB — HEMOGLOBIN A1C: Hgb A1c MFr Bld: 8.1 % — ABNORMAL HIGH (ref 4.6–6.1)

## 2011-02-06 LAB — PROTIME-INR
INR: 0.97 (ref 0.00–1.49)
Prothrombin Time: 12.4 seconds (ref 11.6–15.2)
Prothrombin Time: 12.8 seconds (ref 11.6–15.2)

## 2011-02-06 LAB — TROPONIN I: Troponin I: 0.03 ng/mL (ref 0.00–0.06)

## 2011-02-06 LAB — LIPID PANEL: VLDL: 26 mg/dL (ref 0–40)

## 2011-02-07 LAB — GLUCOSE, CAPILLARY: Glucose-Capillary: 119 mg/dL — ABNORMAL HIGH (ref 70–99)

## 2011-02-07 LAB — POCT HEMOGLOBIN-HEMACUE: Hemoglobin: 12.5 g/dL (ref 12.0–15.0)

## 2011-02-08 LAB — CBC
Hemoglobin: 11.1 g/dL — ABNORMAL LOW (ref 12.0–15.0)
MCV: 90.7 fL (ref 78.0–100.0)
RBC: 3.55 MIL/uL — ABNORMAL LOW (ref 3.87–5.11)
WBC: 5.9 10*3/uL (ref 4.0–10.5)

## 2011-02-08 LAB — DIFFERENTIAL
Eosinophils Absolute: 0.1 10*3/uL (ref 0.0–0.7)
Lymphs Abs: 1.2 10*3/uL (ref 0.7–4.0)
Monocytes Absolute: 0.5 10*3/uL (ref 0.1–1.0)
Monocytes Relative: 8 % (ref 3–12)
Neutrophils Relative %: 69 % (ref 43–77)

## 2011-02-08 LAB — BASIC METABOLIC PANEL
CO2: 24 mEq/L (ref 19–32)
Chloride: 108 mEq/L (ref 96–112)
Creatinine, Ser: 1.42 mg/dL — ABNORMAL HIGH (ref 0.4–1.2)
GFR calc Af Amer: 44 mL/min — ABNORMAL LOW (ref >60)
Potassium: 5.3 mEq/L — ABNORMAL HIGH (ref 3.5–5.1)
Sodium: 139 mEq/L (ref 135–145)

## 2011-02-08 LAB — PROTIME-INR: INR: 0.93 (ref 0.00–1.49)

## 2011-02-08 LAB — APTT: aPTT: 27 seconds (ref 24–37)

## 2011-02-09 LAB — DIFFERENTIAL
Basophils Absolute: 0.1 10*3/uL (ref 0.0–0.1)
Basophils Relative: 1 % (ref 0–1)
Eosinophils Absolute: 0.1 10*3/uL (ref 0.0–0.7)
Eosinophils Relative: 1 % (ref 0–5)
Monocytes Absolute: 0.5 10*3/uL (ref 0.1–1.0)

## 2011-02-09 LAB — COMPREHENSIVE METABOLIC PANEL
ALT: 15 U/L (ref 0–35)
AST: 18 U/L (ref 0–37)
Albumin: 3.6 g/dL (ref 3.5–5.2)
Alkaline Phosphatase: 72 U/L (ref 39–117)
Chloride: 105 mEq/L (ref 96–112)
GFR calc Af Amer: 48 mL/min — ABNORMAL LOW (ref >60)
Potassium: 4.3 mEq/L (ref 3.5–5.1)
Total Bilirubin: 0.6 mg/dL (ref 0.3–1.2)

## 2011-02-09 LAB — GLUCOSE, CAPILLARY
Glucose-Capillary: 187 mg/dL — ABNORMAL HIGH (ref 70–99)
Glucose-Capillary: 330 mg/dL — ABNORMAL HIGH (ref 70–99)

## 2011-02-09 LAB — CANCER ANTIGEN 27.29: CA 27.29: 18 U/mL (ref 0–39)

## 2011-02-09 LAB — CBC
Platelets: 189 10*3/uL (ref 150–400)
RBC: 3.84 MIL/uL — ABNORMAL LOW (ref 3.87–5.11)
WBC: 5.7 10*3/uL (ref 4.0–10.5)

## 2011-02-23 LAB — CBC
HCT: 30.5 % — ABNORMAL LOW (ref 36.0–46.0)
MCV: 94.5 fL (ref 78.0–100.0)
Platelets: 167 10*3/uL (ref 150–400)
WBC: 6 10*3/uL (ref 4.0–10.5)

## 2011-02-23 LAB — BASIC METABOLIC PANEL
BUN: 19 mg/dL (ref 6–23)
Chloride: 106 mEq/L (ref 96–112)
Potassium: 4 mEq/L (ref 3.5–5.1)

## 2011-02-23 LAB — GLUCOSE, CAPILLARY
Glucose-Capillary: 105 mg/dL — ABNORMAL HIGH (ref 70–99)
Glucose-Capillary: 232 mg/dL — ABNORMAL HIGH (ref 70–99)

## 2011-02-25 LAB — GLUCOSE, CAPILLARY
Glucose-Capillary: 153 mg/dL — ABNORMAL HIGH (ref 70–99)
Glucose-Capillary: 170 mg/dL — ABNORMAL HIGH (ref 70–99)
Glucose-Capillary: 211 mg/dL — ABNORMAL HIGH (ref 70–99)

## 2011-02-25 LAB — LIPID PANEL
LDL Cholesterol: 158 mg/dL — ABNORMAL HIGH (ref 0–99)
Total CHOL/HDL Ratio: 5 RATIO
Triglycerides: 123 mg/dL (ref <150)
VLDL: 25 mg/dL (ref 0–40)

## 2011-02-25 LAB — COMPREHENSIVE METABOLIC PANEL
ALT: 16 U/L (ref 0–35)
AST: 22 U/L (ref 0–37)
CO2: 23 mEq/L (ref 19–32)
Chloride: 109 mEq/L (ref 96–112)
GFR calc Af Amer: 45 mL/min — ABNORMAL LOW (ref >60)
GFR calc non Af Amer: 37 mL/min — ABNORMAL LOW (ref >60)
Potassium: 4.3 mEq/L (ref 3.5–5.1)
Sodium: 140 mEq/L (ref 135–145)
Total Bilirubin: 0.6 mg/dL (ref 0.3–1.2)

## 2011-02-25 LAB — CARDIAC PANEL(CRET KIN+CKTOT+MB+TROPI)
CK, MB: 10.9 ng/mL — ABNORMAL HIGH (ref 0.3–4.0)
CK, MB: 53.4 ng/mL — ABNORMAL HIGH (ref 0.3–4.0)
Relative Index: 11.9 — ABNORMAL HIGH (ref 0.0–2.5)
Total CK: 229 U/L — ABNORMAL HIGH (ref 7–177)
Troponin I: 11.81 ng/mL (ref 0.00–0.06)
Troponin I: 6.77 ng/mL (ref 0.00–0.06)

## 2011-02-25 LAB — BRAIN NATRIURETIC PEPTIDE: Pro B Natriuretic peptide (BNP): 75 pg/mL (ref 0.0–100.0)

## 2011-02-25 LAB — POCT CARDIAC MARKERS
CKMB, poc: 2 ng/mL (ref 1.0–8.0)
Myoglobin, poc: 292 ng/mL (ref 12–200)
Troponin i, poc: 0.09 ng/mL (ref 0.00–0.09)

## 2011-02-25 LAB — BASIC METABOLIC PANEL
BUN: 10 mg/dL (ref 6–23)
Chloride: 105 mEq/L (ref 96–112)
Potassium: 4.1 mEq/L (ref 3.5–5.1)
Sodium: 139 mEq/L (ref 135–145)

## 2011-02-25 LAB — CBC
HCT: 37.6 % (ref 36.0–46.0)
MCHC: 34.5 g/dL (ref 30.0–36.0)
MCV: 94.5 fL (ref 78.0–100.0)
Platelets: 192 10*3/uL (ref 150–400)
RBC: 3.68 MIL/uL — ABNORMAL LOW (ref 3.87–5.11)
RDW: 13.7 % (ref 11.5–15.5)
WBC: 6.9 10*3/uL (ref 4.0–10.5)

## 2011-02-25 LAB — DIFFERENTIAL
Eosinophils Relative: 1 % (ref 0–5)
Lymphocytes Relative: 16 % (ref 12–46)
Lymphs Abs: 1.1 10*3/uL (ref 0.7–4.0)
Monocytes Absolute: 0.5 10*3/uL (ref 0.1–1.0)

## 2011-02-26 LAB — URINALYSIS, ROUTINE W REFLEX MICROSCOPIC
Bilirubin Urine: NEGATIVE
Glucose, UA: NEGATIVE mg/dL
Hgb urine dipstick: NEGATIVE
Ketones, ur: NEGATIVE mg/dL
Protein, ur: NEGATIVE mg/dL
Urobilinogen, UA: 0.2 mg/dL (ref 0.0–1.0)

## 2011-02-26 LAB — DIFFERENTIAL
Basophils Absolute: 0 10*3/uL (ref 0.0–0.1)
Basophils Relative: 1 % (ref 0–1)
Eosinophils Relative: 2 % (ref 0–5)
Monocytes Absolute: 0.6 10*3/uL (ref 0.1–1.0)
Monocytes Relative: 11 % (ref 3–12)
Neutro Abs: 3.1 10*3/uL (ref 1.7–7.7)

## 2011-02-26 LAB — GLUCOSE, CAPILLARY
Glucose-Capillary: 135 mg/dL — ABNORMAL HIGH (ref 70–99)
Glucose-Capillary: 151 mg/dL — ABNORMAL HIGH (ref 70–99)
Glucose-Capillary: 160 mg/dL — ABNORMAL HIGH (ref 70–99)
Glucose-Capillary: 166 mg/dL — ABNORMAL HIGH (ref 70–99)

## 2011-02-26 LAB — SEDIMENTATION RATE: Sed Rate: 14 mm/hr (ref 0–22)

## 2011-02-26 LAB — BASIC METABOLIC PANEL
CO2: 26 mEq/L (ref 19–32)
Chloride: 110 mEq/L (ref 96–112)
GFR calc Af Amer: 55 mL/min — ABNORMAL LOW (ref >60)
Glucose, Bld: 129 mg/dL — ABNORMAL HIGH (ref 70–99)
Potassium: 4.2 mEq/L (ref 3.5–5.1)
Sodium: 141 mEq/L (ref 135–145)

## 2011-02-26 LAB — CBC
HCT: 36.7 % (ref 36.0–46.0)
Hemoglobin: 12.7 g/dL (ref 12.0–15.0)
MCHC: 34.6 g/dL (ref 30.0–36.0)
RBC: 3.91 MIL/uL (ref 3.87–5.11)
RDW: 13.1 % (ref 11.5–15.5)

## 2011-02-26 LAB — HEMOGLOBIN A1C: Mean Plasma Glucose: 171 mg/dL

## 2011-02-26 LAB — TSH: TSH: 4.332 u[IU]/mL (ref 0.350–4.500)

## 2011-02-26 LAB — APTT: aPTT: 24 seconds (ref 24–37)

## 2011-02-28 LAB — CARDIAC PANEL(CRET KIN+CKTOT+MB+TROPI)
Relative Index: INVALID (ref 0.0–2.5)
Total CK: 98 U/L (ref 7–177)

## 2011-02-28 LAB — URINALYSIS, ROUTINE W REFLEX MICROSCOPIC
Bilirubin Urine: NEGATIVE
Ketones, ur: 15 mg/dL — AB
Nitrite: NEGATIVE
pH: 6.5 (ref 5.0–8.0)

## 2011-02-28 LAB — COMPREHENSIVE METABOLIC PANEL
ALT: 16 U/L (ref 0–35)
AST: 22 U/L (ref 0–37)
Albumin: 3.6 g/dL (ref 3.5–5.2)
Alkaline Phosphatase: 77 U/L (ref 39–117)
Chloride: 104 mEq/L (ref 96–112)
GFR calc Af Amer: 57 mL/min — ABNORMAL LOW (ref >60)
Potassium: 4.3 mEq/L (ref 3.5–5.1)
Total Bilirubin: 0.7 mg/dL (ref 0.3–1.2)

## 2011-02-28 LAB — URINE MICROSCOPIC-ADD ON

## 2011-02-28 LAB — BASIC METABOLIC PANEL
BUN: 15 mg/dL (ref 6–23)
CO2: 26 mEq/L (ref 19–32)
Chloride: 111 mEq/L (ref 96–112)
Glucose, Bld: 146 mg/dL — ABNORMAL HIGH (ref 70–99)
Potassium: 4.1 mEq/L (ref 3.5–5.1)

## 2011-02-28 LAB — LIPID PANEL
Cholesterol: 175 mg/dL (ref 0–200)
HDL: 28 mg/dL — ABNORMAL LOW (ref >39)
Triglycerides: 118 mg/dL (ref <150)

## 2011-02-28 LAB — GLUCOSE, CAPILLARY: Glucose-Capillary: 161 mg/dL — ABNORMAL HIGH (ref 70–99)

## 2011-02-28 LAB — DIFFERENTIAL
Basophils Absolute: 0 10*3/uL (ref 0.0–0.1)
Basophils Relative: 0 % (ref 0–1)
Eosinophils Relative: 0 % (ref 0–5)
Monocytes Absolute: 0.3 10*3/uL (ref 0.1–1.0)

## 2011-02-28 LAB — CBC
MCV: 92 fL (ref 78.0–100.0)
Platelets: 165 10*3/uL (ref 150–400)
RBC: 3.66 MIL/uL — ABNORMAL LOW (ref 3.87–5.11)
WBC: 3.8 10*3/uL — ABNORMAL LOW (ref 4.0–10.5)
WBC: 5.8 10*3/uL (ref 4.0–10.5)

## 2011-02-28 LAB — TSH: TSH: 0.93 u[IU]/mL (ref 0.350–4.500)

## 2011-02-28 LAB — POCT CARDIAC MARKERS
CKMB, poc: <1 ng/mL — ABNORMAL LOW (ref 1.0–8.0)
Troponin i, poc: <0.05 ng/mL (ref 0.00–0.09)

## 2011-02-28 LAB — HEMOGLOBIN A1C: Mean Plasma Glucose: 174 mg/dL

## 2011-02-28 LAB — SEDIMENTATION RATE: Sed Rate: 30 mm/hr — ABNORMAL HIGH (ref 0–22)

## 2011-04-05 NOTE — H&P (Signed)
[name], [name]                 ACCOUNT NO.:  [id number]   MEDICAL RECORD NO.:  [id number]          PATIENT TYPE:  EMS   LOCATION:  MAJO                         FACILITY:  [facility]   PHYSICIAN:  Eduard Clos, MDDATE OF BIRTH:  06-05-1935   DATE OF ADMISSION:  04/26/2009  DATE OF DISCHARGE:                              HISTORY & PHYSICAL   PRIMARY CARE PHYSICIAN:  Dr. Anna Genre at [facility].   CHIEF COMPLAINT:  Difficulty talking.   HISTORY OF PRESENT ILLNESS:  A 76 year old female known history of CAD  status post CABG, status post stenting, hypertension, hyperlipidemia,  diabetes mellitus type 2, woke up today early in the morning at 4:00  with frontal headache and expressive aphasia.  The symptoms were  persistent.  They called EMS around 10 a.m. and was brought in to ER.  The patient did not have any focal deficits.  The headache was  persistent.  CT head was done, which did not show any acute findings.  At present, the patient has been admitted for further workup for  possible CVA.   The patient denies any loss of consciousness, loss of any function of  upper or lower extremities.  Denies any dizziness, chest pain, abdominal  pain, nausea or vomiting, or diarrhea.  History is also obtained with  the help of the patient's family and neighbor.   The patient's family states that she is getting more confused at times  recently, and last week she was very weak and taken to her doctor at  [facility], who felt that she may be dehydrated.  There was concern for her  renal function at that time.   PAST MEDICAL HISTORY:  Hypertension.  History of cancer breast status  post mastectomy 9 years ago on the left side.  CAD status post CABG.  Status post stenting.  Hypertension.  Hyperlipidemia.  Diabetes mellitus  type 2.   PAST SURGICAL HISTORY:  CABG, stenting, left-sided mastectomy for CA  breast.   MEDICATIONS ON ADMISSION:  1. Synthroid 88 mcg p.o. daily.  2. Metformin 1000 mg  p.o. b.i.d. and 500 mg at lunch.  3. Aspirin 81 mg p.o. b.i.d.  4. Prilosec 20 mg p.o. daily.  5. Isosorbide mononitrate 50 mg p.o. daily.  6. Nitroglycerin p.r.n. chest pain.   ALLERGIES:  No known drug allergies.   SOCIAL HISTORY:  The patient denies smoking cigarettes, drinking  alcohol, or using illegal drugs.   FAMILY HISTORY:  Nothing contributory.   REVIEW OF SYSTEMS:  As in the history of present illness.  Nothing else  significant.   PHYSICAL EXAMINATION:  CONSTITUTIONAL:  The patient examined at bedside,  not in acute distress.  VITAL SIGNS:  Blood pressure 128/106, pulse 77 per minute, temperature  98.2, respirations 18 per minute, O2 saturation 97%.  HEENT:  Anicteric, no pallor.  Tongue midline.  Face normal facial  asymmetry.  No neck rigidity.  CHEST:  Bilateral air entry present.  No rhonchi.  No crepitus.  HEART:  S1, S2 heard.  ABDOMEN:  Soft, nontender.  Bowel sounds heard.  CNS:  Awake.  Has difficulty expressing words.  Able to understand and  follows commands.  Moves upper and lower extremities 5/5.  Grossly  intact.  Facial features as in HEENT.  EXTREMITIES:  Peripheral pulses present.  No edema.   LABORATORY STUDIES:  Chest x-ray nothing acute.  CT of the head shows no  acute findings.  Small vessel ischemic changes.  CBC, WBC 5.8,  hemoglobin 13.7, hematocrit 39, platelets 165,000, neutrophils 86%.  Basic metabolic panel sodium 140, potassium 4.3, chloride 104, carbon  dioxide 27, glucose 207, BUN 15, creatinine 1.1, alkaline phosphatase  77, AST 22, ALT 16, total protein 7.2, albumin 3.6, calcium 9.3, CK-MB  less than 1, troponin I less than 0.05.  UA is negative for nitrites and  leukocytes, ketones 15, glucose 250.  Anion gap 9.   ASSESSMENT:  1. Expressive aphasia, possible cerebrovascular accident.  2. Headache, possibly from sinusitis.  3. Hypertension.  4. Diabetes mellitus type 2.  5. Hypothyroidism.  6. Coronary artery disease status  post CABG status post stenting.  7. History of cancer breast status post left-sided mastectomy 9 years      ago.   PLAN:  1. Admit the patient to telemetry.  2. Place the patient on neuro checks.  3. Aspirin after bedside swallow.  4. Will get an MRI of the brain, MRA brain, 2D echo, carotid and      transcranial Doppler, and will cycle cardiac markers, place the      patient on KVO.  5. Will get a neurology consult, check sed rate, and will also start      on Avelox for possible upper respiratory infection, and tramadol      for headache and further recommendations as the condition evolves.      Eduard Clos, MD  Electronically Signed     ANK/MEDQ  D:  04/26/2009  T:  04/26/2009  Job:  [id number]

## 2011-04-05 NOTE — H&P (Signed)
NAMETOCARA, MENNEN                 ACCOUNT NO.:  [id number]   MEDICAL RECORD NO.:  [id number]          PATIENT TYPE:  INP   LOCATION:  1826                         FACILITY:  [facility]   PHYSICIAN:  Cherene Altes, MD     DATE OF BIRTH:  January 01, 1935   DATE OF ADMISSION:  07/06/2009  DATE OF DISCHARGE:                              HISTORY & PHYSICAL   REASON FOR ADMISSION:  TIA versus complex migraine.   HISTORY OF PRESENT ILLNESS:  Ms. Schmelzer is a 76 year old white female  with a past medical history of migraine headache, coronary artery  disease, status post 3-vessel CABG in 2004, diabetes, and  hypothyroidism, who was brought to the emergency room by her family  earlier tonight for some confusion and difficulty with word-finding.  Patient states that she felt bad for the last 2 days; however,  described this mostly as a generalized malaise and not anything  specific.  This afternoon, she described some confusion and states that  she noted that she was having a hard time getting her words out.  Her  family is not present at this time but did state to the emergency  department physician that she was having some difficulty with word-  finding.  Patient denies any focal weakness, numbness, or tingling of  any extremities or loss of consciousness.  The patient states that she  had a stroke 8 to 9 weeks ago but is unclear on the details of that.   At present, the patient states she feels weak but very, very tired.  She  is otherwise without complaint.   REVIEW OF SYSTEMS:  Patient denies fever, nausea, vomiting, diarrhea, or  constipation.  No chest pain, shortness of breath, or urinary symptoms.  All other systems are reviewed and are negative except as described in  the HPI.   PAST MEDICAL HISTORY:  1. Coronary artery disease, status post 3-vessel CABG in 2004.  2. Diabetes.  3. Hypothyroidism.  4. History of migraine.  5. TIA in June, 2010.   SOCIAL HISTORY:  Denies tobacco,  alcohol, or drug use.   MEDICATIONS:  1. Metformin 500 mg daily.  2. Levothyroxine 88 mcg daily.  3. Aspirin 325 mg daily.  4. Vitamin B12, unknown dose.   ALLERGIES:  No known drug allergies.   PHYSICAL EXAMINATION:  VITAL SIGNS:  Blood pressure 149/59, respiratory  rate 20, O2 sat 100% on room air.  Pulse 90.  GENERAL:  A pleasant, well-appearing 76 year old white female asleep on  arrival; however, easily arousable and alert and oriented x3.  No acute  distress.  HEENT:  Oropharynx clear.  Mucous membranes moist.  NECK:  Normal JVP.  CARDIOVASCULAR:  Regular rate and rhythm.  No murmurs, rubs or gallops.  LUNGS:  Clear to auscultation bilaterally.  ABDOMEN:  Benign.  EXTREMITIES:  No edema.  Pulses 2+ DP pulses bilaterally.  NEURO:  Cranial nerves II-XII are intact.  Strength 5/5 in all 4  extremities.  Normal sensation globally.   LABS:  CBC with diff, UA, and coags within normal limits.  A BNP is  significant for a glucose of 129 and a creatinine of 1.17.  It is  otherwise normal.   CT of the head shows no intracranial hemorrhage.  No acute infarct.   EKG:  Normal sinus rhythm.  Nonspecific T wave changes in the  anterolateral and inferior leads.  Left anterior fascicular block.   IMPRESSION/PLAN:  1. Transient ischemic attack versus migraine headache:  Ms. Vanscyoc      complains of 2 days of general malaise.  She had some confusion and      difficulty with word-finding earlier this afternoon and states this      evening, she developed a left-sided occipital headache.  The      headache is consistent with her previous migraine and is relieved      after treatment with Zofran, morphine, and Compazine in the      emergency department.  The patient did have a transient ischemic      attack at the beginning of June but had a negative MRI at that time      for cerebrovascular accident.  We will admit the patient to the      hospital for observation, repeat an MRI/MRA of her  brain, and based      on the results, make a decision about further neurologic workup.  2. Hypothyroidism:  Will continue the patient's Synthroid 88 mcg      daily.  TSH is pending.  3. Diabetes:  The patient only states she is on 500 mg of Metformin a      day at present.  Review of her discharge on June 7th shows that she      was supposed to be on Metformin 1000 mg p.o. b.i.d.  We will obtain      a hemoglobin A1C and hold Metformin while in-house pending the need      for any contrast procedures.  We will treat the patient with a      sliding-scale insulin in the interim.  4. Coronary artery disease, status post 3-vessel coronary artery      bypass graft in 2004.  Patient has no signs or symptoms of active      ischemia at this time.  She is on aspirin 325 mg at home.  She      denies taking Imdur 15 mg daily or Zocor 20 mg nightly.  Her blood      pressure is mildly elevated in the emergency department.  She was      treated with 5 mg of Norvasc.  We will put the patient back on her      Imdur and Zocor and monitor her and make a decision about the      addition of further medications based on blood pressure and      cardiovascular symptoms.      Cherene Altes, MD  Electronically Signed     PS/MEDQ  D:  07/07/2009  T:  07/07/2009  Job:  [id number]

## 2011-04-05 NOTE — Discharge Summary (Signed)
[name], [name]                 ACCOUNT NO.:  [id number]   MEDICAL RECORD NO.:  [id number]          PATIENT TYPE:  INP   LOCATION:  3741                         FACILITY:  [facility]   PHYSICIAN:  Charlestine Massed, MDDATE OF BIRTH:  13-Aug-1935   DATE OF ADMISSION:  04/26/2009  DATE OF DISCHARGE:  04/27/2009                               DISCHARGE SUMMARY   PRIMARY CARE PHYSICIAN:  Dr. Anna Genre at [facility].   NEUROLOGY:  Pramod P. Pearlean Brownie, MD at [facility].   REASON FOR ADMISSION:  Expressive aphasia.   DISCHARGE DIAGNOSES:  1. Transient ischemic attack with expressive aphasia which is      completely resolved.  2. Prior history of coronary artery disease, currently stable.  3. Diabetes mellitus, currently sugar is controlled.  4. Hypertension, blood pressure stable.  5. Hyperthyroidism.  6. Acute bronchitis.  7. History of breast cancer status post mastectomy 9 years ago, no      further issues.  8. Dyslipidemia.   DISCHARGE MEDICATIONS:  1. Synthroid 88 mcg p.o. daily.  2. Metformin 1000 mg p.o. b.i.d. and 500 mg p.o. at lunch.  3. Aspirin 325 mg p.o. daily.  4. Prilosec 20 mg p.o. daily.  5. Imdur 15 mg p.o. daily.  6. Nitroglycerin 0.4 mg sublingual p.r.n. for chest pain up to 3 doses      in 20 minutes.  7. Zocor 20 mg p.o. nightly.  8. Z-Pak 1 Dosepak, to follow instructions on the pack.   HOSPITAL COURSE BY PROBLEM:  1. Expressive aphasia/transient ischemic attack:  The patient was      admitted yesterday on April 26, 2009, for complaints of sudden      development of frontal headache and expressive aphasia.  She did      not have any other focal deficits.  The headache was persistent.      She said that the headache woke her up from sleep.  The patient did      not lose any consciousness.  No definite neurological deficits      seen, otherwise, in the motor system, and there was no dizziness or      abdominal pain, no nausea, vomiting, diarrhea.  As per the  family,      she has had some episodes of confusion at times.  The patient did      not have any other symptoms today.   The patient was seen by Neurology, and total stroke evaluation was done.  MRI was done, which revealed no evidence of any stroke.  Carotid artery  Dopplers and arterial Dopplers showed no evidence of a significant  stenosis.  Echocardiogram revealed no evidence of any cardiac embolic  source, no aortic stenosis, and only mild grade 1 diastolic dysfunction.  Lab works revealed evidence of dyslipidemia, for which she has been  advised diet modification, as well as medications have been prescribed.  A Neurology followup was done and opined the patient can be discharged  as there is total resolution of symptoms at this time and continue MR  aspirin and follow up with Neurology as needed,  so the patient is being  discharged back home.  1. Coronary artery disease, currently stable:  Continue Imdur and      aspirin 325 mg.  As per the previous list available here, the      patient is not on any beta-blockers and the reason is still clearly      not known whether she had any significant bradycardia or not is      still not known, so I am not starting any beta-blockers at this      time as there is no indication for that currently and as her      coronary artery disease is currently stable.  2. Type 2 diabetes mellitus:  Continue metformin and at this time, she      will be followed up with a primary doctor as an outpatient.  3. Dyslipidemia:  To follow up with PMD in another 4 weeks for check      of liver function tests.  The patient cannot be started on a statin      because she is intolerant to STATIN, and she is taking garlic for      that, so we will start her on Zetia for that, and we will inform      the patient about that.  4. Acute bronchitis:  The patient has been having cough and minimal      sputum production for the first 4-5 days.  She was given Avelox       from 2 days.  We will continue her with Z-Pak as outpatient and no      further treatment needed.  Chest x-ray did not reveal evidence of      any patches at this time.   DISPOSITION:  Discharged back home.   FOLLOWUP:  Follow up with primary care doctor in another 3 or 4 weeks as  needed.  Follow up with Neurology in a month's time.   DISCHARGE INSTRUCTIONS:  1. Exercise, fall precautions.  2. Adhere to diet and medications.  3. A total of 40 minutes spent on this discharge.      Charlestine Massed, MD  Electronically Signed     UT/MEDQ  D:  04/27/2009  T:  04/28/2009  Job:  [id number]   cc:   Pramod P. Pearlean Brownie, MD

## 2011-04-05 NOTE — Consult Note (Signed)
[name], [name]                 ACCOUNT NO.:  [id number]   MEDICAL RECORD NO.:  [id number]          PATIENT TYPE:  INP   LOCATION:  3025                         FACILITY:  [facility]   PHYSICIAN:  Marlan Palau, M.D.  DATE OF BIRTH:  11-26-1934   DATE OF CONSULTATION:  07/07/2009  DATE OF DISCHARGE:                                 CONSULTATION   CHIEF COMPLAINT:  Questionable a aphasia, questionable TIA versus  migraine headache.   HISTORY OF PRESENT ILLNESS:  This is a 76 year old female with a history  of migraine headaches, questionable TIA in June 2010, CAD, hypertension,  and hyperlipidemia.  The patient was recently seen in 2010 by Dr.  Roseanne Reno for questionable TIAs.  At that time, stroke workup returned  negative.  The patient had a normal carotid ultrasound, normal 2-D echo  and MRI was negative.  On July 07, 2009, the patient was brought to  the emergency room secondary to daughter noting word difficulty and  confusion.  According to her daughter 2-3 days prior to admission, the  patient was feeling malaised and lethargic.  On the evening of July 07, 2009, the patient was noted having difficulty getting her words to  form sentences.  The primary care physician was contacted and the  patient's family was recommended to bring the patient to emergency room.  At the emergency room, the patient's blood glucose was 129, blood  pressure was 156/100, temperature was 98.6.  The patient was admitted to  the hospital and an MRI of brain was obtained.  MRI showed negative for  acute stroke.  The patient felt back to baseline the next morning and  was scheduled be discharged.  The patient, however, started to have a  left occipital headache that progressed.  Left occipital headache became  severe enough that neurology was consulted.  Daughter states that she  does have a history of migraine headaches and over the past few months,  most of her aphasic and confusional spells for which  she has been seen  in the hospital are usually accompanied by a headache.  The patient is  on no prophylactic treatment for her headache.  The patient's normal  migraine headache is bilateral frontal headaches and are accompanied by  photophobia.   At present time, the patient is lying supine in her bed with a towel  over her head.  The patient is complaining of severe headache in the  left occipital region along with photophobia.  The patient denies any  nuchal rigidity, any numbness or tingling, paresthesia or inability to  move upper or lower extremities.  The patient has been experiencing  nausea and vomiting, however, this is secondary to the morphine she was  given in the emergency room.  Upon further questioning, the patient  stated that in the past when she was a child, she had migraines quite  frequently, however, cannot recall a migraine headache in the past few  months.   PAST MEDICAL HISTORY:  Questionable TIA, significant for migraine  hypertension, hyperlipidemia, CAD with three-vessel CABG in 2004,  diabetes,  hypothyroidism, prior hospitalization in June 2010 with  bilateral frontal headache, aphasia, right-sided weakness and confusion  and breast cancer status mastectomy and radiation.  Stroke risk factors  include hypertension, diabetes, hyperlipidemia, CAD.   MEDICATIONS:  The patient takes metformin 500 mg daily, levothyroxine 88  mcg daily, aspirin 325 mg daily and B12.   ALLERGIES:  Negative.   FAMILY HISTORY:  Positive for hypertension and cancer.   SOCIAL HISTORY:  The patient lives with her husband, is very close to  her daughter does not smoke, drink or do illicit drugs.   REVIEW OF SYSTEMS:  Review of systems are negative with the exception of  above.   PHYSICAL EXAMINATION:  GENERAL:  Temperature is 98.0, blood pressure is  103/56 her 854, respirations 18, O2 sat 100%.  LUNGS:  Clear to auscultation bilaterally.  NECK:  Supple and negative for  bruits.  CARDIOVASCULAR:  Regular rate and rhythm.  No rubs, gallops or murmurs.  NEUROLOGICAL EXAM:  Face symmetrical.  Tongue is midline.  Pupils equal,  round, reactive, accommodating to light.  Extraocular muscles are  intact.  Neck is supple.  The patient moves all extremities.  Full range  of motion, 5/5 strength.  Finger-to-nose and heel-to-shin is both smooth  and within normal limits.  Rapid alternating movements and fine and  finger are within normal limits.  Sensation is intact to pinprick, light  touch, vibration, and proprioception.  The patient has bilateral  downgoing toes.  Speech is clear and well enunciated.  No drift is noted  bilaterally upper or lower extremities.  Gait was not tested at present  time.   LABORATORY DATA:  Sodium is 141, potassium 4.2, chloride 110, CO2 26,  BUN 18, creatinine 1.17, glucose 129 and white blood cell count 5.3,  platelets 194, hemoglobin/hematocrit 12.7 and 36.7.  PT is 12.3, INR is  0.9, PTT is 24.  HbA1c is pending.  UA is negative.  Cholesterol 175,  triglycerides 118, HDL 28, LDL is 123.  EKG shows normal sinus rhythm.   CT/MRI:  MRI of the brain shows chronic small vessel disease with no  acute process.  MRA of the brain shows no stenosis or occlusion within  the anterior circulation and shows bilateral posterior PCA stenosis.   ASSESSMENT/PLAN:  This is a 76 year old female with what most likely is  migraine variant with past medical history significant for migraine  headaches.  Will treat the patient with Depacon 1000 mg IV over 10  minutes now.  Check ESR.  Will follow tomorrow.   Thank you very much for this consultation.     ______________________________  Crystal Morn, PA-C      C. Lesia Sago, M.D.  Electronically Signed    DS/MEDQ  D:  07/07/2009  T:  07/07/2009  Job:  [id number]

## 2011-04-05 NOTE — Discharge Summary (Signed)
NAMEVENORA, [name]                 ACCOUNT NO.:  [id number]   MEDICAL RECORD NO.:  [id number]           PATIENT TYPE:   LOCATION:                                 FACILITY:   PHYSICIAN:  Monte Fantasia, MD  DATE OF BIRTH:  Nov 13, 1935   DATE OF ADMISSION:  07/07/2009  DATE OF DISCHARGE:                               DISCHARGE SUMMARY   PRIMARY CARE PHYSICIAN:  Dr. Lonie Peak, office number [phone].   DISCHARGE DIAGNOSES:  1. Transient ischemic attack.  2. Hypothyroidism.  3. Diabetes.  4. Coronary artery disease, status post coronary artery bypass graft      in 2004.  5. Hypertension.  6. Migraine headache.   MEDICATIONS UPON DISCHARGE:  1. Levothyroxine 88 mcg p.o. daily.  2. Aspirin 325 mg p.o. daily.  3. Metformin 500 mg p.o. daily.  4. Imdur 15 mg p.o. daily.  5. Zocor 20 mg p.o. daily.   COURSE DURING THE HOSPITAL STAY:  [name], a pleasant 76 year old  Caucasian lady patient was admitted on July 07, 2009, with complaints  of mild confusion and difficulty in word finding with generalized  malaise.  The patient was admitted to rule out transient ischemic attack  and was admitted and had CT of the head without contrast on admission,  which showed no acute intracranial findings.  MRI and MRA of the head  was negative for any acute or reversible findings or vessel occlusions.  The patient was also evaluated by Neurology and as per the assessment  was more likely secondary to migraine headaches and was given Depacon  1000 mg IV one dose times over 10 minutes.  The patient's headache was  relieved rapidly and at present, the patient is medically stable to be  discharged from Neurology standpoint and can be discharged home.  The  patient is recommended to follow up with her primary care physician as  an outpatient.   RADIOLOGICAL INVESTIGATIONS DONE DURING THE STAY IN THE HOSPITAL:  1. Chest x-ray done on July 06, 2009.  Impression:  Low-volume chest  without any cardiopulmonary disease.  2. CT head without contrast done on July 06, 2009.  Impression:  No      acute intracranial findings, chronic sinus disease of the left      sphenoid sinus.  3. MRI of the brain with and without contrast.  Impression:  No change      since the previous examination.  No acute or reversible findings,      no evidence of metastatic disease, chronic small vessel disease.  4. MRA of the head.  Impression:  No major vessel occlusions, stenotic      disease within the posterior cerebral arteries bilaterally.   LABORATORIES DONE DURING THE STAY IN THE HOSPITAL:  Total WBC 5.3,  hemoglobin 12.7, hematocrit 36.7, platelets of 194.  PT 12.3, INR of  0.9, and PTT is 24.  Sodium 141, potassium 4.2, chloride 110, bicarb 26,  glucose 129, BUN 18, creatinine 1.17, calcium of 9.4, hemoglobin A1c  7.6.  TSH is 4.332.  UA is negative.   DISPOSITION:  The patient is medically stable to be discharged home and  is recommended to follow up with her primary care physician as an  outpatient.  The patient has verbalized understanding of the same.   Total time for discharge of 40 minutes to include 15 minutes of  counseling.      Monte Fantasia, MD  Electronically Signed     MP/MEDQ  D:  07/08/2009  T:  07/08/2009  Job:  [id number]   cc:   Lonie Peak

## 2011-04-05 NOTE — Consult Note (Signed)
[name], [name]                 ACCOUNT NO.:  [id number]   MEDICAL RECORD NO.:  [id number]          PATIENT TYPE:  INP   LOCATION:  3741                         FACILITY:  [facility]   PHYSICIAN:  Noel Christmas, MD    DATE OF BIRTH:  1934/12/18   DATE OF CONSULTATION:  04/26/2009  DATE OF DISCHARGE:                                 CONSULTATION   REFERRING PHYSICIAN:  Eduard Clos, MD   REASON FOR CONSULTATION:  Acute onset of speech difficulty and right-  sided weakness.   HISTORY OF PRESENT ILLNESS:  This is a 76 year old lady who woke up with  difficulty with speech as well as right-sided weakness and confusion  early this morning.  The patient apparently became worse with weakness  later during the morning.  EMT is noted.  Right-sided weakness as well  as marked expressive aphasia at around 10 a.m.  The patient has previous  history of similar speech difficulty about 14-15 months ago, which  completely resolved.  CT scan today showed white matter small vessel  ischemic changes, but was otherwise unremarkable with no signs of acute  hemorrhage.  Speech and right-sided weakness have improved during her  emergency room stay.  The patient has been on aspirin 325 mg per day.   PAST MEDICAL HISTORY:  Remarkable for breast cancer, hypothyroidism,  diabetes mellitus, coronary artery disease, and TIA.   CURRENT MEDICATIONS:  1. Aspirin 325 mg per day.  2. Metformin 500 mg.  3. Synthroid 88 mcg per day.  4. Garlic tablets.  5. Fish oil.  6. Isosorbide 15 mg once a day.   PHYSICAL EXAMINATION:  GENERAL:  Appearance was that of elderly lady,  medium built who was alert and cooperative, in mild distress from  headache.  She was oriented to place and person.  She was disoriented to  time including incorrect month.  She follow commands well.  She had  problems with speech reception occasionally, however.  She also had mild  expressive aphasia including paraphasic errors at times  which were  frustrating.  At other times, the patient was unaware that she had  erroneous speech output.  She had mild difficulty in naming common  objects.  HEENT:  Pupils were equal, react normally to light.  Extraocular  movements were full and conjugate.  She had dense right visual field  defect.  There was mild right facial numbness as well as mild-to-  moderate right lower facial weakness.  Hearing was normal.  Speech was  not dysarthric.  Palatal movement was normal.  NEUROLOGIC:  Strength of upper extremities was slightly impaired  distally and the right upper extremity with reduced hand grip and arm  strength compared to the left.  Strength in lower extremities was  normal.  Deep tendon reflexes were 1+ and symmetrical.  Plantar  responses were mute.  Sensory examination was normal except for reduced  perception of touch over the right hand compared to the left.  Coordination of upper extremities was normal.  Carotid auscultation was  normal.   CLINICAL IMPRESSION:  1. Acute right  middle cerebral artery ischemic stroke with residual      mild expressive and receptive aphasia as well as mild right facial      and distal upper extremity weakness.  2. Transient ischemic attack in similar vascular distribution 15      months ago, resolved.   RECOMMENDATIONS:  1. Change of meds aspirin to Plavix 75 mg per day.  2. MRI and MRA of the brain without contrast.  3. Carotid Doppler study.  4. Echocardiogram.  5. Physical therapy, occupational therapy, and speech therapy      consults.   Thank you for asking me to evaluate [name].      Noel Christmas, MD  Electronically Signed     CS/MEDQ  D:  04/26/2009  T:  04/27/2009  Job:  [id number]

## 2011-04-08 NOTE — Op Note (Signed)
NAME:  [name], [name]                          ACCOUNT NO.:  [id number]   MEDICAL RECORD NO.:  [id number]                   PATIENT TYPE:  AMB   LOCATION:  ENDO                                 FACILITY:  [facility]   PHYSICIAN:  Anselmo Rod, M.D.               DATE OF BIRTH:  [birth year]   DATE OF PROCEDURE:  04/12/2004  DATE OF DISCHARGE:                                 OPERATIVE REPORT   PROCEDURE:  Screening colonoscopy.   ENDOSCOPIST:  Charna Elizabeth, M.D.   INSTRUMENT USED:  Olympus video colonoscope.   INDICATIONS FOR PROCEDURE:  76 year old white female with a personal history  of breast cancer and adenomatous polyps removed over five years ago  undergoing repeat colonoscopy to rule out colonic polyps.  The patient also  has a family history of colon cancer in her father.  She has experienced  some weight loss in the recent past.   PREPROCEDURE PREPARATION:  Informed consent was procured from the patient.  The patient was fasted for eight hours prior to the procedure and prepped  with a bottle of magnesium citrate and a gallon of GoLYTELY the night prior  to the procedure.   PREPROCEDURE PHYSICAL:  Patient with stable vital signs.  Neck supple.  Chest clear to auscultation.  S1 and S2 regular.  Abdomen soft with normal  bowel sounds.   DESCRIPTION OF PROCEDURE:  The patient was placed in the left lateral  decubitus position, sedated with 60 mg of Demerol and 6 mg Versed in slow  incremental doses.  Once the patient was adequately sedated, maintained on  low flow oxygen and continuous cardiac monitoring, the Olympus video  colonoscope was advanced into the rectum to the cecum.  The patient's  position was changed from the left lateral to the supine position with  gentle application of abdominal pressure to reach the cecal base.  The  appendiceal orifice and ileocecal valve were clearly visualized and  photographed.  No masses, polyps, erosions, ulcerations, or diverticula  were  seen.  Retroflexion in the rectum revealed no abnormalities.  The patient  tolerated the procedure well without complications.   IMPRESSION:  Normal colonoscopy to the cecum.   RECOMMENDATIONS:  1. Repeat CRC screening is recommended in the next five years unless the     patient develops any abnormal symptoms in the interim.  2. Continue a high fiber diet with liberal fluid intake.  3. Outpatient follow up in the next two weeks for further recommendations.                                              Anselmo Rod, M.D.   JNM/MEDQ  D:  04/12/2004  T:  04/12/2004  Job:  [id number]   cc:   Lonie Peak, M.D.

## 2011-04-08 NOTE — H&P (Signed)
NAME:  Crystal, [name]                          ACCOUNT NO.:  [id number]   MEDICAL RECORD NO.:  [id number]                   PATIENT TYPE:  OIB   LOCATION:  3714                                 FACILITY:  [facility]   PHYSICIAN:  Thereasa Solo. Little, M.D.              DATE OF BIRTH:  Oct 03, 1935   DATE OF ADMISSION:  07/22/2003  DATE OF DISCHARGE:                                HISTORY & PHYSICAL   ADMISSION DIAGNOSIS:  Unstable angina.   HISTORY:  [name] is a 76 year old female who originally had a  catheterization in January, 2004 and was recommended bypass surgery for LAD  disease that was poor quality for intervention.  She refused.  As a result  of this she had a stent placed in her LAD that was non drug eluting.  There  were two stents that were overlapping.  These stents actually covered the  ostium of the diagonal and it was initially angioplastied through the  stents.  She presented to my office about a month later with recurrent  angina.  As a result of this she underwent a repeat cardiac catheterization  that showed the stents to be widely patent but the ostium of the first  diagonal to be severely narrowed.  In additional she had some mild 40%  narrowing in the mid portion of the right coronary artery.  Attempts at  intervention to the ostium of the diagonal were unsuccessful, I could not  get the wire to cross through the stents.  In view of this she was seen by  CVTS and felt not to be a surgical candidate just for the diagonal disease.  She had been managed medically and had done reasonably well but about 48  hours prior to this dictation she began having a tightness in her chest and  yesterday the tightness became more severe, lasting intermittently for  hours, and took at least two nitroglycerin for relief.  Her cardiogram  however has shown no changes and she was brought in today for outpatient  catheterization.   Her repeat catheterization on July 22, 2003 shows the  stent to be widely  patent, the ostium of the first diagonal to be 99% obstructed.  In the  proximal portion of the LAD just past the ostium is a hazy area that is  about 70% narrowed.  This is new from her cath in February.  Her circumflex  is free of disease, and the mid portion of the RCA now has 60 and 70% area  of narrowing.  Her LV function is well preserved.   She is hospitalized, started on IV heparin and is to be seen by Dr.Van Trigt  for revascularization later this week.   PAST MEDICAL HISTORY:  1. History of breast cancer in 2000.  2. Adult onset diabetes in 2001.  3. A stroke involving the right eye in 1988.   OPERATIONS:  Breast  surgery in 2000.  Percutaneous intervention to her LAD in 2004.   FAMILY HISTORY:  Mother, father, sister, and two brothers all have heart  disease.  Her mother has diabetes.  Her father and brother have cancer.   SOCIAL HISTORY:  She is married with three children.  Tenth grade education.  No cigarettes, no alcohol.   REVIEW OF SYSTEMS:  Denies depression although has been relatively inactive  since her February cath and was placed on Lexapro by Dr. Delories Heinz who is  her primary care doctor and she became more active.  She on her own has  recently stopped her Lexapro.  She has had no awareness of palpitations,  paroxysmal nocturnal dyspnea or orthopnea.  She has had mild puffiness of  her ankles at bedtime.  She has occasional nocturia, no dysuria, no cough.   OUTPATIENT MEDICATIONS:  1. Zocor 40 mg 1/2 tablet daily - this was actually held because of     tiredness and fatigue which she thinks improved with stopping the Zocor.  2. Plavix 75 mg daily.  3. Aspirin once a day.  4. Synthroid 0.88 mg daily.  5. Avandia 8 mg daily.  6. Glucophage 500 mg b.i.d. (this has been on hold for 24 hours prior to the     cath).  7. Verapamil 240 mg 1/2 tablet b.i.d.  8. Imdur 60 mg 1/2 tablet b.i.d.  9. Protonix p.r.n.  10.      Sublingual  nitroglycerin p.r.n.   PHYSICAL EXAMINATION:  VITAL SIGNS: Heart rate was in the 70's and sinus,  blood pressure was 123/61.  LUNGS: Were completely clear.  I heard no wheezing, rales or rhonchi.  CARDIAC: Regular rhythm, no murmur.  ABDOMEN: Soft, nontender with no peripheral edema.  NECK: No carotid bruits, neck veins not distended.  HEENT: Posterior pharynx normal.  PULSES: Equal and symmetrical.  NEUROLOGIC: Grossly normal exam.   ASSESSMENT:  1. Severe two-vessel coronary disease with unstable angina on significant     medical therapy.  Needs revascularization.  1. Diabetes mellitus on oral agents.  2. Hypothyroidism on Synthroid.                                                Thereasa Solo. Little, M.D.    ABL/MEDQ  D:  07/22/2003  T:  07/22/2003  Job:  [id number]   cc:   Kizzie Furnish, M.D.  [address]  [place]  [place] [address]  Fax: [phone]   Mikey Bussing, M.D.  [address]  [place]  [place] [address]  Fax: [phone]

## 2011-04-08 NOTE — H&P (Signed)
[name], [name]                          ACCOUNT NO.:  [id number]   MEDICAL RECORD NO.:  [id number]                   PATIENT TYPE:  INP   LOCATION:  2030                                 FACILITY:  [facility]   PHYSICIAN:  Mikey Bussing, M.D.           DATE OF BIRTH:  Nov 20, 1935   DATE OF ADMISSION:  08/16/2003  DATE OF DISCHARGE:                                HISTORY & PHYSICAL   ADMITTING PHYSICIAN:  Kerin Perna, M.D.   CARDIOLOGIST:  Thereasa Solo. Little, M.D.   REASON FOR ADMISSION:  Deep venous thrombosis of right lower extremity.   CHIEF COMPLAINT:  Right thigh and knee pain, unable to walk.   HISTORY OF PRESENT ILLNESS:  The patient is a 76 year old white female who  recently underwent coronary artery bypass grafting X3 for class 4  progressive angina with severe two vessel coronary artery disease.  This was  performed on July 23, 2003 with left internal mammary artery graft to  the left anterior descending and vein grafts to the diagonal and right  coronary artery.  Her preoperative ejection fraction was 55%.  She had an  uncomplicated postoperative course and was discharged home on July 31, 2003.  She has done well following surgery with her only complaint being  progressive swelling of her right thigh and knee.  She denies any current  angina.  She states the surgical incisions have healed in her leg and  sternum without difficulty.  She denies any fever or problems controlling  her blood sugar.  She called today and stated she had severe pain in the  right leg which has been progressive since last night.  She is unable to  walk on the leg at this time.  She presents now to the emergency department  for further evaluation and therapy.   PAST MEDICAL HISTORY:  1. Type 2 diabetes mellitus.  2. Coronary artery disease, status post coronary artery bypass grafting X3     on July 23, 2003, (prior left anterior descending stenting in early     2004).  3. Cerebrovascular accident involving the right eye in 1988.  4. History of breast cancer, status post left partial mastectomy.   CURRENT HOME MEDICATIONS:  1. Glucophage 500 mg p.o. b.i.d.  2. Toprol XL 25 mg daily.  3. Aspirin one daily.  4. Avandia 8 mg daily.  5. Synthroid 0.88 mg per day.  6. Protonix 40 mg daily.  7. Zocor 20 mg daily.   ALLERGIES:  She states she is not allergic to any medications.   SOCIAL HISTORY:  The patient is not a smoker.  She is married with three  children.  She has a tenth grade education.   FAMILY HISTORY:  Positive for coronary artery disease, positive for  diabetes.   REVIEW OF SYMPTOMS:  No recent history of fever or change in weight.  She  denies any visual  changes recently or difficulty swallowing.  She has been  doing her post cardiac surgery rehabilitation walking program 15 minutes  twice a day.  She denies any recent gastrointestinal symptoms.  She denies  any recent urinary tract problems.  She does have a prior history of venous  disease with varicosities in both lower extremities.  She does have history  of superficial phlebitis as demonstrated by preoperative vein mapping as  well as demonstrated by findings at surgery where the greater saphenous vein  has evidence of thickening and chronic thrombosis below the knee on the  right side.  The vein above the knee was somewhat sclerotic but adequate for  use as a conduit.  Neurological review is negative other than the stroke  involving her right eye.  She denies any recent pulmonary problems or  productive cough.   PHYSICAL EXAMINATION:  VITAL SIGNS:  Her temperature is 98.1.  Heart rate 64  and regular.  Blood pressure 117/50.  Respirations 18.  Room air oxygen  saturation 98%.  GENERAL APPEARANCE:  Is that of a middle-aged white female who was limping  badly in the emergency room and in obvious discomfort.  Once she is on the  examination table her pain has significantly  improved.  HEENT:  Normocephalic, full extraocular muscles.  Pharynx clear.  NECK:  Without jugular venous distention or mass.  LUNGS:  Clear and the chest incision is healing well.  CARDIAC:  Examination reveals regular rhythm without S3 gallop or murmur.  ABDOMEN:  Examination is soft, nontender.  Rectal examination is deferred.  EXTREMITIES:  Peripheral pulses are 2+ in both lower extremities.  Examination of extremities reveals no pedal edema of either leg.  The right  leg has significant edema from the knee up to the groin with some warmth.  There is some mild fluctuance medially just above the right knee.  She has  no calf tenderness in either leg.  NEUROLOGICAL:  Her neurological examination includes oriented X3, alert and  responsive, she moves all extremities.  She is unable to bear weight on the  right lower extremity secondary to pain but there is no demonstrated  weakness once she is lying supine on the examination table.   LABORATORY DATA:  CBC, chemistries and coagulation profiles are pending.   IMPRESSION:  1. Deep venous thrombosis of right lower extremity following greater     saphenous vein harvest for coronary artery bypass grafting on July 23, 2003.  2. Diabetes.  3. Coronary artery disease status post coronary artery bypass grafting.  4. History of breast cancer.    PLAN:  The patient will be admitted for Lovenox protocol.  We will obtain a  duplex ultrasound of the lower extremity.  We will also obtain a regular  ultrasound to rule out a fluid collection, seroma, hematoma or potential  abscess.  We will place the patient on bedrest with leg elevation and  heating pad to the leg.  Will follow up with her studies as ordered.                                                Mikey Bussing, M.D.    PV/MEDQ  D:  08/16/2003  T:  08/16/2003  Job:  [id number]   cc:   Thereasa Solo. Little, M.D. [address]  [place] [address]  Fax: [phone]    [facility]

## 2011-04-08 NOTE — H&P (Signed)
NAMEKEHLANI, VANCAMP                          ACCOUNT NO.:  [id number]   MEDICAL RECORD NO.:  [id number]                   PATIENT TYPE:  INP   LOCATION:  1826                                 FACILITY:  [facility]   PHYSICIAN:  [place] Bing, M.D. [facility]           DATE OF BIRTH:  [birth year]   DATE OF ADMISSION:  12/22/2002  DATE OF DISCHARGE:                                HISTORY & PHYSICAL   PRIMARY CARDIOLOGIST:  Dr. Samule Ohm.   HISTORY OF PRESENT ILLNESS:  A 76 year old woman who recently underwent a  complex multistent procedure of the LAD/diagonal.  Recurring chest in  hospital prompted repeat angiography which revealed a 70% stenosis at the  stent site in the diagonal.  Technically difficult procedure, this area was  redilated to a final lesion of approximately  50%.   Since returning home, the patient has not done well.  She has had near  constant mid substernal chest heaviness.  She has exertional dyspnea and  substantial exercise intolerance.  She has had progressive weakness  prompting her to make a call to our answering service and subsequent re-  assessment in the emergency department.  Her current symptoms are described  as different than her presenting symptoms.   CURRENT MEDICATIONS:  1. Clopidogrel 75 mg daily.  2. Aspirin 325 mg daily.  3. Levothyroxine 0.88 mg daily.  4. Avandia 8 mg daily.  5. Metformin 1 g daily.  6. Simvastatin 40 mg daily.  7. Atenolol 50 mg b.i.d.  8. Isosorbide mononitrate 90 mg daily.  9. PPI and nitroglycerin p.r.n.   PAST MEDICAL HISTORY:  Notable for carcinoma of the left breast treated with  lumpectomy and radiation therapy in 2000.  She has had a total hysterectomy.  She has a herniated nucleus pulposis in her lumbar spine with chronic low  back pain.   SOCIAL HISTORY:  Married.  Retired from work at [employer].  No use of tobacco  products  nor alcohol.   FAMILY HISTORY:  Markedly positive for coronary disease.   REVIEW OF  SYSTEMS:  Notable for chronic dyspnea on exertion.  GERD symptoms.  All other systems negative.   PHYSICAL EXAMINATION:  GENERAL:  Depressed appearing washed out woman.  VITAL SIGNS:  Temperature 97.8, heart rate is 60 and regular.  Respirations  18 and blood pressure 108/68.  O2 saturation 97% on room air.  HEENT:  Anicteric sclerae.  NECK:  No jugular venous distention.  No bruits.  ENDOCRINE:  No thyromegaly.  HEMATOPOIETIC:  No adenopathy.  CARDIAC:  Normal first and second heart sounds.  Fourth heart sound present.  LUNGS:  Clear.  ABDOMEN:  Soft, nontender.  No organomegaly.  EXTREMITIES:  Distal pulses intact.  Ecchymosis of her femoral artery  puncture sites with a small hematoma on the left.  NEUROMUSCULAR:  Symmetric strength and tone.   CHEST X-RAY:  NAD.   EKG:  Not currently available  for review.  Described as sinus rhythm with  left axis and mild ST segment depression in leads I and L.  Poor R wave  progression.  This was thought to be similar to a prior tracing of eight  days ago.   LABORATORY DATA:  Unremarkable with hemoglobin 11.6, normal renal function,  glucose of 190, negative CK-MB and elevated troponin of 0.12 that is  improved from a prior value of 1.55 on 1/24.   IMPRESSION:  The patient is experiencing symptoms which could reflect  ongoing myocardial ischemia.  The absence of an exertional component is  somewhat atypical.  Overall, her symptoms are difficult to evaluate.  She  may be experiencing some adverse effects from her beta blocker which will be  discontinued.  She had a normal TSH during her last admission.  One approach  would be a pharmacologic stress Cardiolite study to document ischemia.  If  clearly present, a decision could be made regarding attempted repeat  intervention versus single vessel bypass surgery.                                                 [place] Bing, M.D. [facility]    RR/MEDQ  D:  12/22/2002  T:  12/22/2002  Job:   [id number]

## 2011-04-08 NOTE — H&P (Signed)
NAMEANNALISE, [name]                          ACCOUNT NO.:  [id number]   MEDICAL RECORD NO.:  [id number]                   PATIENT TYPE:  INP   LOCATION:  1826                                 FACILITY:  [facility]   PHYSICIAN:  Charlton Haws, M.D. [facility]              DATE OF BIRTH:  Apr 05, 1935   DATE OF ADMISSION:  12/09/2002  DATE OF DISCHARGE:                                HISTORY & PHYSICAL   HISTORY OF PRESENT ILLNESS:  The patient is a 76 year old patient of Andres Ege, M.D. in [place] and she has no known cardiac history, but has  multiple coronary risk factors including diabetes, hypercholesterolemia, and  second-hand tobacco smoke. She has a strong family history of coronary  artery disease. She presented with progressive chest pain and dyspnea. Her  chest pain is somewhat atypical in that it is not necessarily exertional. It  is a sharply fleeting pain on the left side of her chest and left upper  extremity.  She is limited on exertion more by fatigue and shortness of  breath.  Her shortness of breath has been increasing over the last month,  particularly the last three or four days.   She has never had a stress test and does not carry a history of chronic lung  disease.   PAST MEDICAL HISTORY:  Remarkable for left breast CA with a partial  mastectomy in 2000 and radiation therapy. She has had a total hysterectomy  and lumbar disk surgery.   ALLERGIES:  No known drug allergies.   MEDICATIONS:  1. Avandia 8 mg a day.  2. Glucophage two tablets daily.  3. Synthroid 88 mcg daily, this dose was recently decreased three to four     months ago.  4. Aspirin daily.   SOCIAL HISTORY:  She lives in [place] with her husband. She is retired from  [employer] and her family history is remarkable for positive coronary artery  disease, particularly in her mother and father.  She has a brother who just  left the hospital after having bypass surgery by Dr. Cornelius Moras.   PHYSICAL EXAMINATION:   GENERAL:  Remarkable for a middle-aged woman in no  distress.  She is overweight.  VITAL SIGNS: Blood pressure 140/80, pulse 70 and regular.  LUNGS:  Clear.  Carotids are normal.  HEART:  Normal heart sounds.  ABDOMEN:  Benign.  EXTREMITIES:  Normal pulses, no edema.   LABORATORY DATA:  Her initial blood work was remarkable for a creatinine of  0.5, her hematocrit 39.1, initial set of enzymes is negative.   Her EKG shows sinus rhythm with poor R wave progression and left axis  deviation.  Her chest x-ray is normal with no active disease and no  cardiomegaly.   IMPRESSION:  The patient's symptoms are a little bit worrisome in regards to  exertional and worsening dyspnea as well as chest pain. She is a diabetic  and may not get classic symptoms. Her EKG shows really poor R wave  progression which may represent an old anterolateral MI. Given her symptoms  and her diabetes, I think it would be worthwhile to proceed with  catheterization on her in the morning. I explained the risks to her  including stroke, need for emergency surgery, bleeding, and infection. She  is willing to proceed. In the meantime, we will treat her with nitrates and  aspirin as well as heparin.  She is currently pain-free. There is no obvious  reason for her dyspnea. In regards to her pulmonary status, she is currently  not smoking and has a normal chest x-ray.   I thinks this makes it even more incumbent on [place] to rule out occult coronary  artery disease as the cause of her dyspnea. Further recommendations will be  based on the results of her catheterization.                                               Charlton Haws, M.D. [facility]    PN/MEDQ  D:  12/09/2002  T:  12/09/2002  Job:  [id number]

## 2011-04-08 NOTE — H&P (Signed)
[name], [name]                 ACCOUNT NO.:  [id number]   MEDICAL RECORD NO.:  [id number]            PATIENT TYPE:   LOCATION:                               FACILITY:  [facility]   PHYSICIAN:  Mila Homer. Sherlean Foot, M.D. DATE OF BIRTH:  04-02-1935   DATE OF ADMISSION:  11/23/2005  DATE OF DISCHARGE:                                HISTORY & PHYSICAL   CHIEF COMPLAINT:  Left knee pain.   HISTORY OF PRESENT ILLNESS:  [name] is a pleasant 76 year old white  female with left knee pain for the past two to three years.  The pain is  becoming progressively worse especially over the past last two to three  months.  The patient did undergo a right total knee arthroplasty per Dr.  Luiz Blare in February 2006.  Right knee pain still persists after surgery,  however, the patient does have good range of motion.  Had a negative workup  for any infection.  The left knee pain is described as a constant pain, no  mechanical symptoms, no assistive devices.  She does have waking pain.  She  has failed conservative treatment which included cortisone injections.  X-  rays of the left knee showed bone-on-bone in the medial compartment.   ALLERGIES:  No known drug allergies.   MEDICATIONS:  1.  Metformin 1,000 mg b.i.d.  2.  Actos __________  mg one p.o. daily.  3.  Synthroid 88 mcg one p.o. daily.  4.  Gemfibrozil 600 mg one p.o. daily.  5.  Aspirin 325 mg one p.o., she takes this one to two times a week.  6.  __________  .   PAST MEDICAL HISTORY:  1.  History of three-vessel bypass.  2.  Diabetes mellitus, type 2.  3.  Hypothyroidism.  4.  History of breast cancer.  5.  Seasonal allergies.  6.  Degenerative changes in the lumbar spine.   PAST SURGICAL HISTORY:  1.  Heel surgery.  2.  Open heart surgery with three-vessel bypass.  3.  Breast cancer surgery, 2000.  4.  Knee replacement, right knee, February 2006, Dr. Luiz Blare.  The patient denies any complications with the above procedures in regards  to  the anesthesia.  She did have a blood transfusion, status post stent  placement in the past.   SOCIAL HISTORY:  The patient denies any tobacco or alcohol use.  She is  married and has three grown children.  She lives in a one story home with  two steps at the usual entrance.   PRIMARY CARE PHYSICIAN:  Pat Kocher, P.A.  Phone number is [phone].   FAMILY HISTORY:  The patient's mother deceased, age 74, had a history of  cardiac disease, Alzheimer's, and diabetes mellitus that was diet-  controlled.  Father deceased, age 13, history of colon cancer and heart  disease.  The patient has two living brothers, one age 54 history of  prostate cancer, the other 81 has heart disease.  She has two living  sisters, the oldest 20.  She is unsure of any medical issues.  The  73-year-  old sister has a history of breast cancer.   REVIEW OF SYSTEMS:  The patient does state she has had a cough and has had  one for years.  No fever, no flu-like symptoms.  She denies any chest pain.  She has shortness of breath with exertion that she feels is due to  deconditioning.  No PND, no orthopnea.  She has frequent constipation.  Otherwise review of systems is negative for noncontributory.  The patient  does have a Living Will and a power-of-attorney, [employer].   PHYSICAL EXAMINATION:  GENERAL:  The patient is a well-developed, well-  nourished, white female in no acute distress.  The patient walks without any  assistive devices.  The patient's mood and affect were appropriate.  She  talks easily with examiner.  VITAL SIGNS:  The patient's height is approximately 5 foot 1.5 inches,  weight is 142 pounds.  Temp 98.2 degrees Fahrenheit, blood pressure is  120/70, pulse 72, respiratory rate 16.  CARDIAC:  Regular rate and rhythm.  No murmurs, rubs, or gallops noted.  CHEST:  Lungs are clear to auscultation bilaterally.  No wheezing, rhonchi,  or rales noted.  ABDOMEN:  Soft, nontender.  No  hepatomegaly, splenomegaly.  Bowel sounds x4  quadrants.  HEENT:  Head is normocephalic and atraumatic without frontal or maxillary  sinus tenderness to palpation.  Conjunctivae is pink.  Sclerae is  nonicteric.  PERRLA.  EOMs are intact.  No visible external ear deformities.  TMs pearly and gray bilaterally.  The buccal mucosa is pink and moist.  The  patient has good dentition except for right incisor which is chipped and in  need of repair.  The pharynx is without erythema or exudate.  Tongue and  uvula midline.  NECK:  Trachea is midline.  Carotids are 2+ bilaterally and without bruits.  No lymphadenopathy.  The patient has no tenderness with palpation along the  cervical column.  She has full range of motion of the neck without pain.  BACK:  No tenderness with palpation over the lumbar or thoracic spine.  BREAST/GENITOURINARY/RECTAL:  All deferred at this time.  NEUROLOGIC:  The patient is alert and oriented x3.  Cranial nerves II-XII  grossly intact.  Ankle and knee jerks are 2+ bilaterally and equal and  symmetric.  Lower extremity strength testing reveals 5/5 strength  throughout.  MUSCULOSKELETAL:  Upper extremities are equal and symmetric in size and  shape.  The patient has full range of motion of both shoulders, elbows,  wrists, and hands.  Radial pulses are 2+ bilaterally.  Lower extremities,  the patient has full range of motion of both hips and flexes to 90 degrees  without pain.  Right knee 0-120 degrees of flexion.  Well healed surgical  incision.  No effusion, no edema.  Valgus varus stressing reveals no laxity.  Left knee with 0-115 degrees of flexion.  She has joint line tenderness both  medially and laterally.  No effusion, no edema.  Valgus and varus stressing  reveals no laxity.  Anterior drawers are negative.  Lower extremities are  non edematous bilaterally and dorsal pedal pulses 2+ bilaterally.  She has  good sensation in the toes throughout.   IMPRESSION: 1.   End stage left knee osteoarthritis with bone-on-bone medial compartment.  2.  History of three-vessel bypass.  3.  Diabetes mellitus, type 2.  4.  Hypothyroidism.  5.  History of breast cancer.  6.  Seasonal allergies.  7.  Lumbar degenerative changes.  8.  Right incisor chip in need of dental repair.   PLAN:  The patient is to undergo all preoperative labs and testing prior to  surgery.  Her primary care Abreanna Drawdy, Pat Kocher, P.A. was contacted about  surgical clearance.  I spoke with him directly and he stated that from his  standpoint, the patient was cleared for surgery, however, the patient would  need to be cleared by Dr. Clarene Duke, her cardiologist prior to surgery.  The  patient will obtain clearance from Dr. Clarene Duke prior to surgery.  The patient  also needs to have the right upper incisor repaired prior to surgery.      Richardean Canal, P.A.    ______________________________  Mila Homer. Sherlean Foot, M.D.    GC/MEDQ  D:  11/11/2005  T:  11/12/2005  Job:  [id number]   cc:   Mila Homer. Sherlean Foot, M.D.  Fax: [phone]

## 2011-04-08 NOTE — Discharge Summary (Signed)
NAMECARLYLE, ACHENBACH                          ACCOUNT NO.:  [id number]   MEDICAL RECORD NO.:  [id number]                   PATIENT TYPE:  INP   LOCATION:  2034                                 FACILITY:  [facility]   PHYSICIAN:  Kerin Perna, M.D.               DATE OF BIRTH:  [birth year]   DATE OF ADMISSION:  07/22/2003  DATE OF DISCHARGE:  07/28/2003                                 DISCHARGE SUMMARY   ADMISSION DIAGNOSES:  Coronary artery disease with unstable angina.   PAST MEDICAL HISTORY:  1. Coronary artery disease, status post stenting of the left anterior     descending in January 2004.  2. 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  3. Diabetes mellitus, type 2, diagnosed 2001, treated with oral     hypoglycemics.  4. History of left breast cancer, status post partial left mastectomy 2000.   ALLERGIES:  The patient has no known drug allergies.   DISCHARGE DIAGNOSES:  Severe two vessel coronary artery disease with class  IV unstable angina, status post coronary artery bypass graft.   HISTORY OF PRESENT ILLNESS:  [name] is a 76 year old Caucasian female.  She has had a stent placement to her LAD in January 2004, and has been  followed by Dr. Clarene Duke since that time.  She underwent repeat cardiac  catheterization for recurrent angina but with no further interventions.  She  had done reasonably well with medical management until July 20, 2003.  On  that day, she began to have tightness in her chest that progressed to severe  tightness.  Dr. Clarene Duke recommended repeated cardiac catheterization.   HOSPITAL COURSE:  On July 22, 2003, [name] was admitted to [facility] under the care of Dr. Julieanne Manson.  She underwent cardiac  catheterization which revealed a hazy stenosis of 60-70% proximal to the LAD  stents along with 99% stenosis of her large diagonal and 80% stenosis of the  proximal right coronary artery.  These were new findings  since her  catheterization in February.  The LV systolic function was well preserved.  Ejection fraction is estimated to be 55%.   She was referred to [facility] for consideration of coronary bypass surgery.  She  was evaluated later in the day by Dr. Kathlee Nations Trigt.  After examination of  the patient and review of the records, he felt that she was an appropriate  candidate for coronary bypass grafting.  The procedure, risks and benefits  were all discussed with Ms. Cabral, and she agreed to proceed with the  surgery.   On July 22, 2003, preoperative arterial evaluation included a carotid  Doppler study which revealed no significant carotid artery disease.  Her  upper extremity evaluation included Allen's test bilaterally.  Her right  Allen's test was normal.  The left was abnormal.  The lower extremity  evaluation revealed ABIs to be  greater than 1.0 bilaterally.   On July 23, 2003, she also underwent saphenous vein mapping.  Impression:  Vessels appear compressible to the knee and adequate on the  right and to the mid thigh on the left.  Vessels of the lower leg walls  appeared thickened with possible chronic thrombus noted.   On July 23, 2003, [name] underwent the following surgical procedure  with Dr. Kathlee Nations Trigt.  Coronary artery bypass grafting x 3.  Grafts  placed at the time of the procedure:  1.  Left internal mammary artery graft  to the left anterior descending artery.  2.  Saphenous vein graft to the  diagonal artery.  3. Saphenous vein graft to the right coronary artery.  A  vein was harvested from the right side using an open surgical technique.  She tolerated this procedure reasonably well and was transferred in stable  condition to the SICU.  She remained hemodynamically stable in the immediate  postoperative period and was extubated in the early morning hours of  postoperative day #1.  She awoke from anesthesia neurologically intact.  The  morning of  postoperative day #1, her hemoglobin was 7.0.  She was transfused  and by the morning of postoperative day #2, her hemoglobin had risen to 9.1,  and her hematocrit to 26.6.  She did have some orthostatic hypotension  postoperative day #2 that required low dose dopamine for support.  This was  weaned as her blood pressure improved and by the morning of postoperative  day #3, her blood pressure was 120/60.  She made very good progress while in  the ICU and was transferred to unit 2000 on postoperative day #3.  Her  diabetes was managed in the immediate perioperative period with the  recommended protocol and Lantus insulin.  Her Avandia was restarted  postoperative day #2.  Plan was to continue Lantus, Avandia and the sliding  scale insulin until discharge and upon discharge resume her Glucophage.   While on unit 2000, she made very good progress in recovering from her  surgery.  This morning, July 28, 2003, postoperative day #5, Ms. Doody  reports feeling very well, expressed a strong desire to go home.  Her vital  signs were stable with blood pressure 138/84.  She is afebrile.  Room air  saturation is 95%.  Her heart is in normal sinus rhythm at 78 beats per  minute.  Her lungs are clear.  She has no GI or GU complaints.  She is  eating well.  Her bowel and bladder functions are within normal limits for  her.  Her chest incision is clean and dry.  Her right thigh incision is  intact with mild serous drainage.  There is no erythema.  Her skin staples  are intact.  She is ambulating in the hallway with minimal assistance.  Her  pain is well controlled.  Ms. Sparlin is progressing very well and  anticipating discharge home this afternoon, July 28, 2003.   LABORATORY DATA:  July 26, 2003, CBC:  White blood 7.5, hemoglobin 9.8,  hematocrit 28.6, platelets 96,000.  On July 28, 2003, chemistries include sodium of 136, potassium 3.7, chloride 99, CO2 30, BUN 17,  creatinine 0.9,  glucose 137.   CONDITION ON DISCHARGE:  Improved.   DISCHARGE MEDICATIONS:  1. Toprol XL 25 mg p.o. every day.  2. Lasix 40 mg p.o. every day for 5 days.  3. Potassium chloride 20 mEq p.o. every day for 5 days.  She is instructed to resume the following home medications:  1. Coated aspirin 325 mg every day.  2. Avandia 8 mg every day.  3. Glucophage 500 mg b.i.d.  4. Synthroid 88 mcg every day.  5. Zocor 20 mg daily (she has been tolerating this dose with difficulty here     in the hospital).  6. Protonix 40 mg daily.  7. For pain management, she may have Ultram 50 mg one to two p.o. q.4-6h.     for moderate to severe pain or Tylenol 325 mg one to two p.o. q.4-6h. for     mild pain.   ACTIVITY:  She should refrain from any driving or heavy lifting, pushing or  pulling.  She is also instructed to continue her daily exercises and daily  walking.   DIET:  The diet should continue to be a diabetic diet.   WOUND CARE:  She is to shower daily with mild soap and water.  She has been  instructed to keep dry gauze dressing over her thigh incisions as needed.    FOLLOW UP:  1. She will have an appointment to see the registered nurse at the [facility]     office for a skin staple removal early next week.  The office will call     with the date and time for that appointment.  2. Dr. Clarene Duke would like to see her in two weeks.  She has been asked to     call to arrange that appointment and has been given his office phone     number.  3. Dr. Donata Clay would like to see her at the [facility] office in 3 weeks.  The     office will call with the day and time of that appointment.  She will be     asked to have a chest x-ray and CBC one hour prior to that appointment.      Toribio Harbour, N.P.                  Kerin Perna, M.D.    CTK/MEDQ  D:  07/28/2003  T:  07/28/2003  Job:  [id number]   cc:   Thereasa Solo. Little, M.D.  [address]  [place] [address]  Fax: [phone]   Mikey Bussing, M.D.  [address]  [place]  [place] [address]  Fax: [phone]   Kizzie Furnish, M.D.  [address]  [place]  [place] [address]  Fax: [phone]

## 2011-04-08 NOTE — Discharge Summary (Signed)
[name], [name]                 ACCOUNT NO.:  [id number]   MEDICAL RECORD NO.:  [id number]          PATIENT TYPE:  INP   LOCATION:  5006                         FACILITY:  [facility]   PHYSICIAN:  Mila Homer. Sherlean Foot, M.D. DATE OF BIRTH:  11/17/1935   DATE OF ADMISSION:  11/23/2005  DATE OF DISCHARGE:  11/25/2005                                 DISCHARGE SUMMARY   ADMISSION DIAGNOSIS:  Advanced degenerative joint disease of the left knee.   DISCHARGE DIAGNOSES:  1.  Advanced degenerative joint disease of the left knee.  2.  Chronic blood loss anemia.  3.  Diabetes mellitus, non-insulin-dependent.  4.  Hypothyroidism.  5.  History of breast malignancy.  6.  Status post coronary artery bypass graft.   PROCEDURE:  Left total knee replacement.   HISTORY:  [name] is a pleasant 76 year old white female with left knee  pain for the past 2-3 years.  The pain is becoming progressively worse,  especially over the last 2-3 months.  The patient underwent a right total  knee arthroplasty by Dr. Luiz Blare in February of 2006.  Her right knee pain  still persists after surgery; however, the patient does have good range of  motion, negative workup for any infection.  The left knee is described as a  constant pain without mechanical symptoms.  She does not use assistance  devices.  She does have pain with ambulation.  She has failed conservative  treatment which includes that of corticosteroid injections.  X-rays of the  left knee revealed bone-on-bone medial compartment DJD.  She is indicated at  this time for total knee replacement.   HOSPITAL COURSE:  [age]-year-old white female admitted November 23, 2005.  After appropriate laboratory studies were obtained as well as 1 g of  Ancef IV on call to the operating room, she was taken to the operating room  where she underwent a left total knee arthroplasty.  She tolerated the  procedure well.  Foley was placed intraoperatively.  She was  continued on  Ancef 1 g IV q.8 h. x3 doses.  A Dilaudid PCA pump in a reduced manner was  performed and provided for the patient.  Lovenox 30 mg subcu q.12 h. was  started on November 24, 2005 at 0800.  Consultations with PT and Care  Management were performed.  The patient was allowed to be weightbearing as  tolerated by Physical Therapy.  The glycemic control order set was also  begun.  Foot sticks were allowed for her labs.  On the 4th, she was weaned  from oral pain medicines.  She was also instructed in Lovenox teaching.  When she had improved and became ambulatory, it was felt that she was a  candidate for discharge.  She was discharged on the 5th of January after her  PCA was discontinued and her IV was also discontinued.  She was discharged  in an improved condition.  EKG revealed sinus bradycardia with left anterior  fascicular block, minimal voltage criteria for LVH, which may be a normal  variant, septal infarct, age undetermined, abnormal EKG.  LABORATORY STUDIES:  Admitted with a hemoglobin of 13.1, hematocrit 36.6%,  white count 6100, platelets 223,000.  Discharge hemoglobin 9.1, hematocrit  26.4, white count 7300, platelets 143,000.  Preop pro time was 12.8 seconds  with an INR of 0.9; PTT was 31.  Preop chemistries:  Sodium 141, potassium  4.6, chloride 109, CO2 27, glucose 184, BUN 21, creatinine 1.1, calcium 9.6,  total protein 6.2, albumin 3.9, AST 19, ALT 15, ALP 74, total bilirubin 0.8.  Discharge chemistries:  Sodium 138, potassium 3.6, chloride 107, CO2 24,  glucose 178, BUN 8, creatinine 0.9 and calcium 7.8.  Glycosylated hemoglobin  was 7.4.  Urinalysis was benign for a voided urine.  Blood type was A-  positive, antibody screen negative.  Urine culture showed no growth.   DISCHARGE INSTRUCTIONS:  Diet as at home.  No driving for 4-6 weeks.  No  lifting for 4-6 weeks.  She can use crutches or walker, weightbearing as  tolerated.  She may shower after the third day  if no drainage.  Follow the  blue instruction sheet.   DISCHARGE MEDICATIONS:  Prescriptions were given for:  1.  Percocet 5/325 mg one to two tabs every 4 hours as needed for pain.  2.  Lovenox 40 mg -- inject once daily as instructed.   FOLLOWUP:  She will have Rehab and Home Health referrals and she will follow  up with Dr. Sherlean Foot in 2 weeks from surgery.   CONDITION ON DISCHARGE:  She was discharged in improved condition.      Oris Drone Petrarca, P.A.-C.    ______________________________  Mila Homer. Sherlean Foot, M.D.    BDP/MEDQ  D:  01/23/2006  T:  01/24/2006  Job:  [id number]

## 2011-04-08 NOTE — Cardiovascular Report (Signed)
NAME:  [name], [name]                          ACCOUNT NO.:  [id number]   MEDICAL RECORD NO.:  [id number]                   PATIENT TYPE:  INP   LOCATION:  6599                                 FACILITY:  [facility]   PHYSICIAN:  Salvadore Farber, M.D. [facility]         DATE OF BIRTH:  Sep 21, 1935   DATE OF PROCEDURE:  12/10/2002  DATE OF DISCHARGE:                              CARDIAC CATHETERIZATION   PROCEDURES:  Left and right heart catheterization, left ventriculography,  coronary angiography, stent to proximal and mid left anterior descending,  Cutting Balloon angioplasty of first diagonal branch.   INDICATIONS:  The patient is a 76 year old lady with diabetes mellitus and  dyslipidemia who presents with crescendo angina over the past several  months.  This finally resulted in rest chest pain for which she was admitted  to the hospital.  She ruled out for myocardial infarction and was scheduled  to undergo diagnostic angiography.   DIAGNOSTIC TECHNIQUE:  Informed consent was obtained. Under 1% lidocaine  local anesthesia, a 6 French sheath was placed in the right femoral artery  and an 8 French sheath in the right femoral vein using the modified  Seldinger technique. Right heart catheterization was performed using balloon-  tipped catheter. Cardiac output was determined using the thermodilution  technique. A 6 French pigtail catheter was then advanced into the LV.  Ventriculography was performed in the RAO projection.  Coronary angiography  was then performed using JL4 and JR4 catheters.   DIAGNOSTIC FINDINGS:  HEMODYNAMICS:  RA 9, RV 28/8/12, PA 26/13/18, PCW  15/16/13, LV 127/9/17, cardiac output 4.0, cardiac index 2.3. There is no  mitral stenosis or aortic stenosis.   VENTRICULOGRAPHY:  EF 60% without regional wall motion abnormality.  No  mitral regurgitation.   CORONARY ANGIOGRAPHY:  1. Left main:  Angiographically normal.  2. LAD:  The LAD is a moderate sized, small diameter  vessel.  It gives rise     to a single large diagonal branch.  There is a 70% stenosis of the     proximal LAD encompassing the takeoff of the first diagonal branch.     There is a 70% stenosis of the mid vessel.  There is an 80% stenosis of     the ostium of diagonal #1 and a 90% stenosis of the proximal portion of     diagonal #1.  3. Circumflex:  The circumflex is a moderate sized vessel giving rise to a     single obtuse marginal branch. It is angiographically normal.  4. RCA:  The RCA is a large, dominant vessel.  There is a 40% stenosis of     the proximal vessel.   IMPRESSION/RECOMMENDATIONS:  There is a proximal left anterior descending  bifurcation disease and additional disease of the first diagonal branch in a  lady with diabetes.  I took the patient off the table and had a lengthy  discussion with both  her and her husband regarding the pros and cons of  coronary artery bypass grafting versus percutaneous intervention.  I quoted  a greater than 50% re-stenosis rate with percutaneous intervention.  When  asked, I recommended coronary bypass grafting over percutaneous  intervention.  However, the patient strongly preferred strategy of  percutaneous intervention with bypass grafting reserved for re-stenosis. She  clearly states pros and cons on each and states the risk associated with  PCI.  I will therefore proceed with PCI for her wishes as I feel this is not  an unreasonable decision.   PERCUTANEOUS CORONARY INTERVENTION TECHNIQUE:  The patient was brought to  the lab. The previous sheath was exchanged for an 8 French sheath in the  right femoral vein and a 7 French sheath in the right femoral artery.  A  total of 1 g of Ancef was administered.  Anticoagulation was initiated with  heparin and ReoPro.  I should note that 600 mg of Plavix was given one hour  prior to the procedure. A 7 [place] Voda left 3.5 guide was advanced over the  wire and engaged in the ostium of the left  main coronary. A BMW wire was  advanced to the distal LAD and a Hi-Torque Floppy wire advanced to the  distal first diagonal branch.  I began with the diagonal.  A 2.0 x 10 mm  Cutting Balloon was advanced to the lesion of the proximal vessel.  It was  inflated to 8 atmospheres for 60 seconds.  It was then pulled back to the  ostial lesion and inflated again to 8 atmospheres for 60 seconds.  It was a  type B dissection of the proximal stenosis.  Therefore, a 2.0 x 15 mm  Maverick was inflated for 120 seconds at 6 atmospheres.  Flow remained TIMI-  3.  There remained a type B dissection with approximately 30% residual  stenosis and TIMI-3 flow.  I then turned to the LAD.  The more proximal  lesion was treated initially using a 2.0 x 10 mm Cutting Balloon at 8  atmospheres. This too resulted in a focal dissection.  The Cutting Balloon  was then advanced to the lesion of the mid LAD.  The same Cutting Balloon  was inflated to 8 atmospheres.  After this, there was a clear spiral  dissection extending from the proximal to mid LAD.  I then took a 2.25 x 23  mm Pixel to the mid LAD being careful to cover the distal edge of the  dissection. It was inflated to 12 atmospheres.  A 2.5 x 23 mm Pixel was then  deployed in the proximal LAD so as to overlap the proximal portion of the  previously placed stent. This resulted in complete sealing of the  dissection.  A 2.25 x 18 mm PowerSail was then used to post-dilate the  distal stent at 14 atmospheres.  A 2.5 x 18 mm PowerSail was then used to  post-dilate the region of overlap at 18 mm and the proximal stent at 16  atmospheres.  Final angiograms demonstrated no residual stenosis in the LAD  and approximately 30% residual stenosis in diagonal #1.  There was a  residual type B dissection in the first diagonal branch and no residual  dissection in the LAD. There was TIMI-3 flow in each vessel.  IMPRESSION/PLAN:  Successful stenting of the left anterior  descending with  Cutting Balloon angioplasty of diagonal #1. There was a residual type B  dissection in  the first diagonal branch. ReoPro will be continued for 12  hours.  Plavix will be continued for a minimum of 30 days.  Aspirin should  be continued indefinitely.  Given the early data suggesting a decrease in re-  stenosis for bare metal stents with rosiglitazone, I would suggest that this  be continued for a minimum of six months.                                                        Salvadore Farber, M.D. [facility]    WED/MEDQ  D:  12/10/2002  T:  12/10/2002  Job:  [id number]   cc:   Kizzie Furnish, M.D.  [address]  [place]  [place] [address]  Fax: [phone]

## 2011-04-08 NOTE — Cardiovascular Report (Signed)
NAME:  [name], [name]                          ACCOUNT NO.:  [id number]   MEDICAL RECORD NO.:  [id number]                   PATIENT TYPE:  OIB   LOCATION:  6523                                 FACILITY:  [facility]   PHYSICIAN:  Thereasa Solo. Little, M.D.              DATE OF BIRTH:  08/27/1935   DATE OF PROCEDURE:  01/14/2003  DATE OF DISCHARGE:                              CARDIAC CATHETERIZATION   INDICATIONS FOR PROCEDURE:  The patient is a 76 year old female who had  significant single-vessel coronary disease but refused bypass surgery.  She  underwent percutaneous intervention on December 10, 2002, which required 2  overlapping non-drug-eluting stents in her proximal and mid LAD, which  jailed her diagonal.  The ostium of the diagonal was narrowed and dissected  during intervention.  It was angioplastied by Dr. Samule Ohm.  She was brought  back on December 12, 2002, for a repeat intervention on the ostium of the  diagonal.   Since her discharge from the hospital, she has continued to have exertional  angina and some angina at rest.  She describes it as her classic pain that  brought her to the hospital in January.  Because of this, she was brought in  for outpatient cardiac catheterization.   DESCRIPTION OF PROCEDURE:  The patient was prepped and draped in the usual  sterile fashion exposing the right groin.  Applying local anesthetic of 1%  Xylocaine, the Seldinger technique was employed, and a 5-French introducer  sheath was placed into the right femoral artery.  Left and right coronary  arteriography and ventriculography was performed.   RESULTS:  1. Hemodynamic monitoring     A. Central aortic pressure was 151/72.     B. Left ventricular pressure 151/12 with no aortic valve gradient noted        at the time of pullback.  2. Ventriculography:  Ventriculography in the RAO projection, using 20 cc of     contrast at 12 cc/second, revealed the distal anterior wall and apical  segments to be hypokinetic.  The ejection fraction was 45% to 50%.  The     end diastolic pressure was 19.  3. Coronary arteriography:  On fluoroscopy, stents were noted in the LAD.     A. Left main:  Normal.     B. LAD:  The LAD crossed the apex of the heart and was a relatively small        diameter vessel, about 2.75.  A long area of overlapping stents in the        proximal and mid LAD were noted, and they were widely patent.  The        first diagonal, which had been jailed by the stents, had ostial 80%        narrowing.  The vessel itself was about 1.75 to 2.2 mm in diameter at        its  largest area.  There was no dye hang up, and there was no evidence        of any dissection.  It appeared to be more ostial narrowing.     C. Circumflex:  The circumflex gave rise to 2 very small OM vessels.     D. Right coronary artery:  This was a dominant vessel that gave rise to        the PDA and a large posterolateral branch.  There were 40% areas of        narrowing that were eccentric in the mid portion of the RCA.   At this point, Dr. Dorris Fetch was asked to evaluate the patient's film.  He  did not feel, with just branch disease in the diagonal, that she was a  candidate for revascularization from a surgical standpoint.  Because of  this, plans for intervention to the ostium of the first diagonal were  undertaken.   A 6-French introducer sheath was placed into the right femoral vein, and the  5-French introducer sheath in the right femoral artery was exchanged out for  a 6-French sheath.  Initially, a JL3.5 guide catheter with sideholes was  used.  A short Luque wire was used.  The ostium was cannulated, and the wire  was finally passed down the LAD and well into the diagonal.  Once this was  obtained, a 2.5 x 15-mm CrossSail was made ready, but the CrossSail balloon  would not pass into the proximal portion of the stents, much less down to  the diagonal.  A 2.0 x 15-mm Maverick balloon  was then made ready and it,  too, would not cross into the proximal portion of the stent.  With this, the  3.5 system backed out.   A JL4 guide catheter was then used.  The short luge wire was placed back  down the diagonal, and again, the 2.0 x 15-mm Maverick balloon was attempted  to cross but would not enter the proximal portion of the stents.   At this time, the decision was made to go to a long over-the-wire system.  A  300-cm luge wire was made ready, and in fact, 2 wires were used.  It took 45  minutes to get the initial long luge wire down the diagonal.  Once in place,  a 2.0 x 10-mm OpenSail balloon was placed on the wire but would not enter  the proximal portion of the stents.  A 1.5 x 10-mm CrossSail balloon would  also not pass.   A Whisper wire was then used, placed down the diagonal branch, and the 1.5  by OpenSail again was tried but would not enter the proximal portion.   The final attempt was with a 1.5 x 20-mm Graft ACE.  The wire would not  engage the ostium of the diagonal.   The procedure was finally terminated and was unsuccessful with the inability  to pass the balloon into the stents and down to the lesion.   Total time, 2 hours and 15 minutes.  Total contrast, 225 cc.   The patient was given Angiomax with her last ACT being 377.  She was given 6  mg of IV Nubain, and 3 mg of IV Versed.   At the termination of the procedure, the LAD and diagonal appeared to be  unchanged from what they were at the beginning of the procedure.  Thereasa Solo. Little, M.D.    ABL/MEDQ  D:  01/14/2003  T:  01/14/2003  Job:  [id number]   cc:   Kizzie Furnish, M.D.  [address]  [place]  [place] [address]  Fax: [phone]   CV[facility]   Salvadore Farber, M.D. [facility]

## 2011-04-08 NOTE — Cardiovascular Report (Signed)
[name], [name]                          ACCOUNT NO.:  [id number]   MEDICAL RECORD NO.:  [id number]                   PATIENT TYPE:  INP   LOCATION:  6531                                 FACILITY:  [facility]   PHYSICIAN:  Salvadore Farber, M.D. [facility]         DATE OF BIRTH:  June 09, 1935   DATE OF PROCEDURE:  12/12/2002  DATE OF DISCHARGE:                              CARDIAC CATHETERIZATION   PROCEDURES:  1. Coronary angiography, balloon angioplasty diagonal-1, balloon angioplasty     of left anterior descending.  2. Intravascular ultrasound of left anterior descending.   CARDIOLOGIST:  Salvadore Farber, M.D.   INDICATIONS:  The patient is a 76 year old lady who presented for an  outpatient catheterization on December 10, 2001, with crescendo angina.  She  had 90% stenosis of the first diagonal and a 70% stenosis of the proximal  LAD crossing the first diagonal branch.  Cardiac balloon angioplasty was  performed to the diagonal, and the LAD was stented to spinal dissection.  There was a residual dissection in the diagonal at the time of the procedure  although flow was TIMI-3 and there was approximately a 50% residual  stenosis.  The patient did well for the first 36-48 hours postprocedure but  then began developing chest discomfort reminiscent of her presenting pain  early this morning.  She had several brief episodes which were unaccompanied  by EKG abnormality.  Due to the recurrent pain, she was brought back to the  lab for relook angiography.   DIAGNOSTIC TECHNIQUE:  Informed consent was obtained.  The patient arrived  to the lab on Integrelin and heparin.  Under 1% lidocaine local anesthesia,  a 6-French sheath was placed in the left femoral artery using modified  Seldinger technique.  Diagnostic angiography of the left system alone was  performed using the JL4 catheter.  This demonstrated widely patent LAD  stent.  There was a 70% stenosis of the proximal portion of the  first  diagonal branch with no clear residual flap.  Due to her recurrent pain and  the residual stenosis in the diagonal, I suspect that she has residual  dissection with intermittent sensation of flow.  Intervention was,  therefore, undertaken.   The sheath was upsized to 7-French.  A XB 4.0 guiding catheter was used.  A  BFW wire was advanced in a prolapsed fashion through the stents into the  distal LAD.  With difficulty, a Whisper wire was placed in the diagonal  branch.  A 2.0 x 9 mm Maverick balloon was then advanced over the wire into  the region of the lesion.  It passed through the stent with some difficulty.  Two inflations at 6 atmospheres were performed in the LAD taking care to  avoid the ostium as there was a question as to whether the wire was under a  strut or not.  Attempt was then made to mass a 2.0 x  18 mm Pixel stent.  This could not be passed beyond the proximal portion of the LAD stent  suggesting that, in fact, the wire was under a proximal strut.  Therefore,  stent and wire were removed.   Again, due to concern that the stent may be under deployed proximally, a 2.5  x 18 mm Quantum Maverick was used to postdilate the proximal portion of the  LAD stent at 16 atmospheres.  Another Whisper wire was then advanced into  the diagonal.  No inflations were made but the balloon was simply used to  test passage.  Attempt was then again made with the previously described  stent.  This could now be passed distal to the proximal edge of the stent  but could not be advanced into the diagonal despite aggressive guide  support.   Decision was, therefore, made for prolonged balloon inflation in an attempt  to obtain a satisfactory balloon angioplasty result in this very small  vessel.  Therefore, a 2.0 x 15 mm Maverick balloon was advanced over a wire  into the proximal diagonal.  Again, care was taken to avoid the ostium out  of concern for that the wire may be under a strut.   A three minute balloon  inflation at 6 atmospheres was performed.  This resulted in approximately  50% residual stenosis with TIMI-3 flow.  There was no evident dissection  flap.  This represents some improvement and hopefully, will stabilize the  dissection flap.  Because of the multiple manipulations in the proximal  portion of the LAD stent with wires and balloons, I performed IVUS to check  stent apposition.   The 40 MHz IVUS system was advanced into the mid LAD.  Automated pull-back  was performed.  This demonstrated that the proximal portion of the LAD stent  was poorly opposed and that the vessel at the proximal stent margin measured  3.9 x 3.2 mm.  Therefore, the proximal portion of the stent was again  dilated using a 3.5 x 18 mm POWERSAIL at 16 atmospheres.  Final angiograms  demonstrated no stenosis in the LAD with TIMI-3 flow to the distal vessels  and approximately 50% residual stenosis in D1 with TIMI-3 flow to the distal  vessel.   IMPRESSION/RECOMMENDATIONS:  Partially successful balloon angioplasty of the  first diagonal.  I am hopeful that this further balloon angioplasty will  stabilize the dissection in the small vessel.  The left anterior descending  stent is widely patent.  We will plan on continuing __________ for 18 hours.  Plavix and aspirin will be continued.  Nitrates will be begun to optimize  flow to this small territory of the diagonal branch.  The sheath will be  removed when the ACT is less than 150 seconds.                                               Salvadore Farber, M.D. [facility]    WED/MEDQ  D:  12/12/2002  T:  12/13/2002  Job:  [id number]   cc:   Charlton Haws, M.D. [facility]

## 2011-04-08 NOTE — Discharge Summary (Signed)
[name], [name]                          ACCOUNT NO.:  [id number]   MEDICAL RECORD NO.:  [id number]                   PATIENT TYPE:  INP   LOCATION:  2024                                 FACILITY:  [facility]   PHYSICIAN:  Salvadore Farber, M.D. [facility]         DATE OF BIRTH:  1935-04-29   DATE OF ADMISSION:  12/22/2002  DATE OF DISCHARGE:  12/23/2002                           DISCHARGE SUMMARY - REFERRING   HISTORY OF PRESENT ILLNESS:  This is a 76 year old female followed by  Salvadore Farber, M.D. [facility], who was recently hospitalized from December 09, 2000, to December 17, 2000, with an acute coronary syndrome. She had a  cutting balloon procedure with PTCA stent of the proximal and mid LAD. This  was complicated by a spiral dissection and the patient had a cutting balloon  PTCA of an 80 to 90% diagonal 1 with a type B dissection and looks like 30%  residual stenosis. There is a normal circumflex.  The RCA had a 40% lesion.   The patient was taken back to the lab for recurrent atypical chest pain and  was found to have widely patent stents of the LAD with a 70% ostial diagonal  which was not able to be stented.  The patient had a PTCA to approximately  50%. She had recurrent chest pain following the second intervention with ST  segment depression in V1 and V2 treated with IV nitroglycerin and heparin  and 2B3A inhibitors.  It was recommended that the patient not be studied  further unless she developed an ST elevation MI.   The patient presented to [facility] on December 22, 2002, with  weakness, fatigue, and intermittent dull chest pain.  Initial cardiac  enzymes were elevated and the patient was admitted for further evaluation.   PAST MEDICAL HISTORY:  Please see history as noted above.  The patient also  has history of elevated lipids, history of hypothyroidism, diabetes, and she  was noted to have bradycardia at the time of admission.   ALLERGIES:  No known drug  allergies.   HOSPITAL COURSE:  As noted this patient was admitted to [facility]  for further evaluation of chest pain.  She had mildly elevated troponin  enzymes, although, the CK-MB enzymes were negative.  She was seen by Dr.  Samule Ohm on the day of discharge. He felt that the patient could be discharged  home at this point. He recommended some changes in her medications. Her beta  blocker was changed to a calcium channel block as it was felt that the beta  blocker was causing fatigue. She was also placed on iron and a two-  dimensional echocardiogram was ordered to be performed in the [facility]  the day after discharge.  She will follow up with Dr. Samule Ohm in  approximately three weeks in the office.   LABORATORY DATA:  CBC on the day of admission revealed hemoglobin  of 11.2,  hematocrit 32.5, WBC 5.2 thousand, platelets 230,000. A chemistry profile on  the day of admission revealed a BUN of 15, creatinine 0.9, potassium 4.4,  glucose 183, and INR was 1.0, PTT was 35.  Cardiac enzymes initially  revealed a troponin of 0.10, CK 57, MB 1.0. Follow-up showed a troponin of  0.12, CK 58, MB 1.0. A lipid profile revealed cholesterol of 272,  triglycerides 142, HDL 46, LDL 198.  A TSH was 1.865.  A D-dimer was 0.84  which was elevated.  Hemoglobin A1C is 7.4.  A chest CT was negative for  acute cardiopulmonary process.   DISCHARGE MEDICATIONS:  1. Plavix 75 mg daily.  2. Enteric-coated aspirin 325 mg daily.  3. Synthroid 0.088 mg daily.  4. Avandia 8 mg daily.  5. Glucophage 1000 mg daily.  6. Verapamil SR 240 mg daily.  7. Zocor 40 mg at bedtime.  8. Protonix 40 mg daily.  9. Atenolol; the patient was instructed not to take this medication any     longer.  10.      IMDUR as previously taken.  11.      Niferex 150 mg daily.  12.      Nitroglycerin p.r.n. for chest pain.   ACTIVITY:  As tolerated.   DIET:  The patient was told to stay on a low salt, low fat, diabetic  diet.   FOLLOW UP:  She was to have an echocardiogram performed on February 3, at  the [facility]. She is to follow up with Dr. Samule Ohm on February 24, at  9:30 a.m., Dr. Fayrene Fearing as needed or as scheduled.   DISCHARGE DIAGNOSES:  1. Known coronary artery disease, with recent myocardial infarction as     described above, status post percutaneous coronary interventions.  2. Admission for chest pain with mildly elevated troponin enzymes, negative     CK-MBs.  3. Hypothyroidism.  4. Elevated lipids.  5. Diabetes mellitus.  6. Sinus bradycardia.  7. Mild anemia.     Delton See, P.A. [facility]                  Salvadore Farber, M.D. [facility]    DR/MEDQ  D:  12/23/2002  T:  12/23/2002  Job:  [id number]   cc:   Kizzie Furnish, M.D.  [address]  [place]  [place] [address]  Fax: [phone]

## 2011-04-08 NOTE — Discharge Summary (Signed)
NAMEPOLLIE, [name]                 ACCOUNT NO.:  [id number]   MEDICAL RECORD NO.:  [id number]          PATIENT TYPE:  INP   LOCATION:  0454                         FACILITY:  [facility]   PHYSICIAN:  Harvie Junior, M.D.   DATE OF BIRTH:  1935/03/22   DATE OF ADMISSION:  12/29/2004  DATE OF DISCHARGE:  01/02/2005                                 DISCHARGE SUMMARY   ADMISSION DIAGNOSES:  1.  End-stage degenerative joint disease, left knee.  2.  Type 2 diabetes mellitus.  3.  Hypothyroidism.  4.  Coronary atherosclerosis.  5.  Status post coronary bypass surgery in the remote past.   DISCHARGE DIAGNOSES:  1.  End-stage degenerative joint disease, left knee.  2.  Type 2 diabetes mellitus.  3.  Hypothyroidism.  4.  Coronary atherosclerosis.  5.  Status post coronary bypass surgery in the remote past.   PROCEDURES IN HOSPITAL:  Right total knee arthroplasty, computer assisted,  Jodi Geralds, MD.   BRIEF HISTORY:  [name] is a pleasant 76 year old female who has a long  history of right knee pain.  She has pain at night and pain with ambulation  and essentially pain with every step.  X-rays of the right knee show severe  end-stage, bone-on-bone arthritis.  She got only temporary relief with  injection therapy and a knee arthroscopy which showed grade 4 degenerative  changes.  Based upon her clinical and radiographic findings, she is felt to  be a candidate for a right total knee arthroscopy, and she is admitted for  this.   PERTINENT LABORATORY STUDIES:  Her hemoglobin on admission was 14.6,  hematocrit 42.9, WBC 5100.  On postop day #1, her hemoglobin was 11.2; #2,  10.9; #3, 10.8.  Pro time on admission was 12.4 seconds with an INR of 0.9  and a PTT of 27.0.  On the day of discharge, her pro time was 19.8 seconds  with an INR of 2.1 on Coumadin therapy.  CMET on admission was unremarkable.  She did have a decreased sodium on postop day #1 on December 19, 2004.  Elevated glucose of  209 on postop day #1.  This was followed during  hospitalization, and on January 02, 2005, she had a sodium of 142,  potassium of 3.5, glucose of 145.  Urinalysis on admission showed trace  leukocyte esterase and rare epithelials.   HOSPITAL COURSE:  The patient was admitted to [facility] on  December 29, 2004 where she underwent right total knee arthroplasty as well  described in Dr. Luiz Blare operative note on December 29, 2004.  Postoperatively, she was put on Ancef 1 gm IV q.8h. x 5 doses.  She was  started on a PCA morphine pump for pain control.  Physical therapy was  ordered for walker ambulation, weightbearing as tolerated on the right, and  a CPM machine was used for range of motion of her knee.  On postop day #1,  she had moderate knee pain.  She has taken fluids without difficulty.  Her  vital signs are stable, and her O2 sats were  100% on 2 L of oxygen.  Her  hemoglobin was 11.2 with a hematocrit of 32.3.  Her CMET was within normal  limits other than a low sodium of 129.  This was replaced with IV fluids.  She is to continue with physical therapy and OT out of bed to the chair.  She is also started on Coumadin and a thrombolytic therapy per pharmacy  protocol.  On postop day #2, the dressing was changed to her right knee, and  the Hemovac drain was pulled.  She had some soreness in her knee, but  overall was doing well.  She had a hemoglobin of 10.9.  INR of 1.3.  Her  Foley catheter that was placed at the time of surgery was discontinued, and  her IV was converted to a saline lock.  She has continued with physical  therapy.  On postop day #3, she was comfortable, and taking p.o. fluids.  She was voiding without difficulty.  Her hematocrit was 31.4.  Potassium of  3.5.  INR of 1.3.  She did make good progress with physical therapy, and was  discharged home on postop day #4, January 02, 2005.  At the time of  discharge, she was in improved condition.  She is on a regular  diabetic  diet.  Her activity status is to be weightbearing as tolerated on the right  with a walker.  At the time of discharge, her vital signs are stable.  She  is afebrile, and her right knee was benign.   DISCHARGE MEDICATIONS:  1.  Coumadin per pharmacy protocol x 1 month postop.  2.  Glucophage 500 mg 2 pills b.i.d.  3.  Synthroid 88 mcg 1 daily.  4.  Darvocet-N 100 1-2 q.4 h. p.r.n. pain.   DISCHARGE INSTRUCTIONS:  The patient will get home-health physical therapy  for range of motion and strengthening of the right knee, and will use a home  CPM machine, and do walker ambulation and weightbearing as tolerated.  She  will follow up with Dr. Luiz Blare in 10 days in the office.  Call sooner if any  problems occur.      JB/MEDQ  D:  02/11/2005  T:  02/11/2005  Job:  [id number]   cc:   Thereasa Solo. Little, M.D.  [address]  [address]  [place]  [place] [address]  Fax: [phone]   Harvie Junior, M.D.  [address]  [place]  [place] [address]  Fax: [phone]   Lonie Peak, [facility]  [place], [place]   Caprice Kluver, M.D.  [employer]

## 2011-04-08 NOTE — Op Note (Signed)
[name], [name]                          ACCOUNT NO.:  [id number]   MEDICAL RECORD NO.:  [id number]                   PATIENT TYPE:  INP   LOCATION:  2306                                 FACILITY:  [facility]   PHYSICIAN:  Kathlee Nations Trigt III, M.D.           DATE OF BIRTH:  01/05/1935   DATE OF PROCEDURE:  07/23/2003  DATE OF DISCHARGE:                                 OPERATIVE REPORT   OPERATION/PROCEDURE:  Coronary artery bypass grafting x3, (left internal  mammary artery to the LAD, saphenous vein graft to diagonal, saphenous vein  graft to right coronary artery).   PREOPERATIVE DIAGNOSES:  Class IV unstable angina with severe two vessel  coronary artery disease.   POSTOPERATIVE DIAGNOSES:  Class IV unstable angina with severe two vessel  coronary artery disease.   SURGEON:  Kerin Perna, M.D.   ASSISTANT:  Rowe Clack, P.A.-C.   ANESTHESIA:  General by Dr. Sampson Goon.   INDICATIONS FOR PROCEDURE:  The patient is a 76 year old obese female  diabetic with previous stent placement to her LAD.  She has had recurrent  chest pain and a cardiac catheterization 24 hours ago demonstrated a hazy  stenosis of 60-70% proximal to the LAD stents along with a 99% stenosis of  the large diagonal and 80% stenosis of the proximal right coronary.  Her LV  systolic function was well preserved.  She was felt to be a candidate for  surgical coronary revascularization based on her coronary anatomy and  symptoms of unstable angina.  Prior to surgery, I examined the patient in  her hospital room and reviewed the results of the cardiac catheterization  with the patient and family.  I discussed with them the indications and  expected benefits of surgical coronary revascularization for treatment of  her coronary artery disease.  I reviewed with them the alternatives to  surgical therapy as well.  We discussed the major aspects of the proposed  operation including the choice of conduit for  grafting to include the left  internal mammary artery and saphenous vein, the location of the surgical  incisions, the use of general anesthesia and cardiopulmonary bypass and the  expected postoperative hospital recovery.  I discussed with the patient the  risks to her of coronary bypass surgery including risks of MI, CVA,  bleeding, infection and death.  She understood these implications for the  surgery and agreed to proceed with the operation as planned under what I  felt was an informed consent.   DESCRIPTION OF PROCEDURE:  The patient was brought to the operating room and  placed supine on the operating room table where general anesthesia was  induced.  The sternal incision was made and the saphenous vein was harvested  from the right thigh.  The endoscopic technique was attempted but was not  successful.  The saphenous vein was harvested using an open technique from  the right thigh.  The vein was adequate to the knee, but below the knee, it  was thrombosed and not adequate.  The left internal mammary artery pedicle  was harvested as a pedicle graft from its origin at the subclavian vessels  and it was a good vessel with excellent flow.  Heparin was administered and  the ACT was documented as being therapeutic.  The sternal retractor was  placed.  The pericardium was opened and a pursestring was placed in the  ascending aorta and the right atrium.  The patient was cannulated and placed  on bypass.  The coronaries were identified for grafting and the mammary  artery and vein grafts were prepared for the distal anastomoses.  A  cardioplegia cannula was placed and the patient was cooled to 30 degrees.  As the aortic cross clamp was applied, 700 cc of cold blood cardioplegia was  delivered to the aortic root with good cardioplegic arrest and surface  temperature dropping to less than 12 degrees.  Topical ice saline and slush  were used to augment myocardial preservation and a  pericardial insulator pad  was used to protect the left phrenic nerve.   The distal coronary anastomoses was then performed.  The first distal  anastomosis was the distal right.  This was a 1.5 mm vessel with proximal  80% stenosis.  A reverse saphenous vein was sewn end-to-side with a running  7-0 Prolene with good flow through the graft.  The second distal anastomosis  was the first diagonal.  This had a proximal 99% stenosis.  A reverse  saphenous vein was sewn end-to-side with a running 7-0 Prolene.  There was  good flow through the graft.  Cardioplegia was redosed.  The third distal  anastomosis was the mid portion of the LAD.  This was a 1.5 mm vessel,  proximal 70% stenosis.  The left internal mammary artery pedicle was brought  through an opening created in the left lateral pericardium, was brought down  onto the LAD and sewn end-to-side with a running 8-0 Prolene.  There was  excellent flow through the anastomosis with immediate rise in supple  temperature after release of the pedicle clamp on the mammary artery.  The  mammary pedicle was secured to the epicardium and the aortic cross clamp was  removed.   The heart was cardioverted back to a regular rhythm.  Using a partial  occluding clamp, two proximal vein anastomoses were placed on the ascending  aorta using a 4.0 mm punch and a running 6-0 Prolene.  The partial clamp was  removed and the vein grafts were perfused.  Each had excellent flow and  hemostasis was documented of the proximal and distal anastomoses.  The  patient was rewarmed and reperfused.  Temporary pacing wires were applied.  The lungs were expanded, the ventilator was resumed.  When the patient  reached 37 degrees, she was weaned from bypass without inotropics.  Blood  pressure and cardiac output were stable.  Protamine was administered without  adverse reaction.  The cannulas were removed and the mediastinum was irrigated with warm antibiotic irrigation.   The leg incision was irrigated  and closed in a standard fashion.  The patient had coagulopathy related to  preoperative use of Plavix and a platelet transfusion was administered which  improved her coagulation function. The superior pericardial fat was closed  over the aorta and vein grafts.  Two mediastinal and a left portable chest  tube were placed and brought out through separate incisions.  The sternum  was then closed with interrupted steel wire.  The pectoralis fascia was  closed with a running #1 and the subcutaneous and skin were closed with a  running Vicryl.  Sterile dressings were applied.  Total bypass time was 85  minutes with cross clamp time of 40 minutes.                                               Mikey Bussing, M.D.    PV/MEDQ  D:  07/23/2003  T:  07/24/2003  Job:  [id number]   cc:   Thereasa Solo. Little, M.D.  [address] [place]  [place] [address]  Fax: [phone]   [facility] office

## 2011-04-08 NOTE — Cardiovascular Report (Signed)
NAME:  [name], [name]                          ACCOUNT NO.:  [id number]   MEDICAL RECORD NO.:  [id number]                   PATIENT TYPE:  OIB   LOCATION:  2858                                 FACILITY:  [facility]   PHYSICIAN:  Thereasa Solo. Little, M.D.              DATE OF BIRTH:  01/30/1935   DATE OF PROCEDURE:  07/22/2003  DATE OF DISCHARGE:                              CARDIAC CATHETERIZATION   INDICATIONS FOR TEST:  Ms. Tayag is a 76 year old female who had a stent  placed to her LAD in January of 2004.  At that time she had refused bypass  surgery.  The stent covered the ostium of the diagonal which was  significantly narrowed following stent placement.  Salvadore Farber, M.D.  was able to angioplasty this and got an adequate result.  The patient,  unfortunately, had restenosis and attempts at recrossing into the diagonal  have been unsuccessful.  She had been managed medically and was doing well  until two days prior to this when she began having increasing episodes of  angina improved with nitrates.  She is brought in for outpatient cardiac  catheterization today.   PROCEDURE:  The patient was prepped and draped in the usual sterile fashion  exposing the right groin.  Following local anesthetic with 1% Xylocaine the  Seldinger technique was employed and the 5-French introducer sheath was  placed into the right femoral artery.  Left and right coronary arteriography  and ventriculography in the RAO projection was performed.   RESULTS:  HEMODYNAMIC MONITORING:  Central aortic pressure was 123/61.  Left ventricular pressure was 123/9  with no significant valve gradient noted at time of pullback.   VENTRICULOGRAPHY:  Ventriculography in the RAO projection using 25 mL of contrast at 12  mL/second revealed normal left ventricular systolic function.  Ejection  fraction was approximately 55%.  The end-diastolic pressure was 14.   CORONARY ARTERIOGRAPHY:  On fluoroscopy there was a  stent noted in the LAD distribution.  1. Left main normal.  2. LAD extended down to the apex of the heart.  In the proximal portion of     the LAD was a hazy 60-70% area of narrowing that is new.  It was just     short of being ostial.  The stent which is located in the mid portion of     the LAD was widely patent.  The distal LAD was free of disease.  The     diagonal which was jailed by the stent had ostial 99% narrowing with a     moderate sized diagonal.  3. Circumflex gave rise to one large OM vessel that basically bifurcated.     It was free of disease.  4. Right coronary artery had a long area in its mid portion of     irregularities.  The maximum was about 60%.  There was brisk TIMI 3 flow.  The distal RCA and PDA were free of disease.   CONCLUSION:  1. Normal left ventricular systolic function.  2. New lesion in the proximal left anterior descending with known high grade     stenosis in the ostium of the diagonal which has been unsuccessfully     intervened upon in the past.  There is also moderate disease in the mid     right coronary artery.   Because of the worsening angina and the change in proximal LAD, I have  suggested bypass surgery.  I spoke with Kerin Perna, M.D. who plans to  see the patient later today.  Because of her angina I have admitted her to  the hospital and will start her on IV heparin.                                               Thereasa Solo. Little, M.D.    ABL/MEDQ  D:  07/22/2003  T:  07/22/2003  Job:  [id number]   cc:   Kizzie Furnish, M.D.  [address]  [place]  [place] [address]  Fax: [phone]   [facility]   Salvadore Farber, M.D.

## 2011-04-08 NOTE — Discharge Summary (Signed)
[name], [name]                          ACCOUNT NO.:  [id number]   MEDICAL RECORD NO.:  [id number]                   PATIENT TYPE:  INP   LOCATION:  4702                                 FACILITY:  [facility]   PHYSICIAN:  Salvadore Farber, M.D. [facility]         DATE OF BIRTH:  Mar 17, 1935   DATE OF ADMISSION:  12/09/2002  DATE OF DISCHARGE:  12/17/2002                           DISCHARGE SUMMARY - REFERRING   DISCHARGE DIAGNOSES:  1. Coronary artery disease status post cutting balloon angioplasty and     stenting of the proximal mid left anterior descending.  2. Diabetes mellitus, treated with oral agents.  3. Hypothyroidism on replacement therapy.  4. Hyperlipidemia, currently treated with Zocor.  5. Anemia, stable.  6. Hypokalemia, replaced.  7. History of left breast cancer with a partial mastectomy in 2000, with     followup radiation therapy.  8. Status post total hysterectomy.  9. History of lumbar disk surgery.   HOSPITAL COURSE:  [name] is a 76 year old female patient of Dr. Delories Heinz in [place], [place], who presented with no known cardiac  history.  She had been experiencing progressive chest pain and dyspnea.  She  was admitted to [facility] and laboratory work revealed maximum CK of 245  and MB fraction of 15.2, maximum troponin at 1.56, total cholesterol 272,  triglycerides 142, HDL 46, LDL 198.  TSH was 1.865, hemoglobin A1c 7.4.  Sodium 139, potassium 3.4, BUN 11, creatinine 0.8.  Hemoglobin at 11.0,  hematocrit 31.8, white count 7.1, and platelets 155.   Her EKG revealed normal sinus rhythm, rate 86 with anterolateral ST-T wave  changes.  Upon the patient's arrival to [facility], we withheld her  Glucophage to decrease the risk of lactic acidosis in the setting of IV  contrast.  She ultimately underwent cardiac catheterization on December 10, 2002, and was found to have the following.  Left main angiographically  normal, LAD 70% proximal, 70%  mid, there was an ostial 80% first diagonal,  and 90% stenosis of the proximal D1 vessel.  Circumflex angiographically  normal.  Right coronary artery 40% proximal stenosis.  At this point, Dr.  Samule Ohm had a lengthy discussion with the patient and her husband regarding  PCI versus CABG.  He recommended coronary artery bypass grafting over  percutaneous intervention, however, the patient clearly wished to proceed  with a percutaneous intervention.  He then performed successful stenting  (Pixel) at the LAD with cutting balloon angioplasty of the first diagonal.  At this point, the patient would need to continue with Plavix for a minimum  of six months.  Over the next 24-48 hours, the patient complained of  recurrent angina.  She was then taken back to the cardiac catheterization  laboratory on December 12, 2002, by Dr. Samule Ohm.  At this point, she underwent  coronary angiography as well as IVUS to the LAD.  He then performed a  balloon angioplasty of the first diagonal with a 50% residual stenosis.  After both procedures, due to his _____ and different groin approaches, he  did develop some ecchymosis.  A groin ultrasound revealed no evidence of  pseudoaneurysm or AV fistula bilaterally.  Ultimately, she did develop more  chest and back pain and underwent a CT of the abdomen without contrast.  There was no evidence of retroperitoneal hematoma or bleed.  Ultimately, she  stabilized and was prepared for discharge to home on December 17, 2002.   PHYSICAL EXAMINATION:  At discharge:  VITAL SIGNS:  Pulse 71, blood pressure 129/78, O2 saturations 98% on room  air.  NECK:  No JVD.  LUNGS:  Clear bilaterally.  HEART:  Regular rate and rhythm with a 2/6 systolic ejection murmur at the  right upper sternal border without radiation.  ABDOMEN:  Soft, nontender, nondistended, good bowel sounds.  Bilateral groin  stable.   DISCHARGE MEDICATIONS:  The patient was discharged home in stable condition  on  the following medications:  1. Plavix 75 mg a day indefinitely.  2. Enteric coated aspirin 325 mg a day.  3. Synthroid 88 mcg daily.  4. Avandia 8 mg a day.  5. Glucophage, resume as prior to admission.  6. Sublingual nitroglycerin p.r.n. chest pain.  7. Zocor 40 mg p.o. q.h.s.  8. Atenolol 50 mg 1 tablet twice a day.  9. Imdur 60 mg 1 p.o. q. daily.  10.      Tylenol as needed for pain.   DISCHARGE INSTRUCTIONS:  No lifting, driving, sexual activity, or heavy  exertion for two weeks.  Remain on low-salt, low-fat, low-cholesterol  diabetic diet.  Observe catheterization site for any complications such as  drainage, swelling, or discomfort.  If she feels any of these symptoms, she  needs to contact the office immediately.   FOLLOW UP:  She has a followup appointment with Dr. Samule Ohm on December 30, 2002, at 10 a.m.       Guy Franco, P.A. [facility]                      Salvadore Farber, M.D. [facility]    LB/MEDQ  D:  12/17/2002  T:  12/17/2002  Job:  [id number]   cc:   Kizzie Furnish, M.D.  [address]  [place]  [place] [address]  Fax: [phone]

## 2011-04-08 NOTE — Discharge Summary (Signed)
[name], [name]                 ACCOUNT NO.:  [id number]   MEDICAL RECORD NO.:  [id number]          PATIENT TYPE:  INP   LOCATION:  4703                         FACILITY:  [facility]   PHYSICIAN:  Antonieta Iba, MD   DATE OF BIRTH:  11-14-1935   DATE OF ADMISSION:  02/15/2008  DATE OF DISCHARGE:  02/16/2008                               DISCHARGE SUMMARY   [name] is a 76 year old female patient followed by Dr. Clarene Duke with  history of coronary artery bypass grafting in the year 2004.  She had a  LIMA to her LAD and SVG to her RCA and SVG to her diagonal.  Cath was  done after abnormal pre-op Cardiolite in April 2007.  She had patent  grafts, her EF was 50%.  She has complained about shortness of breath at  that time and since last office visit with approximately a year ago, at  which time she was also complained about progressive fatigue and dyspnea  on exertion.  Today, she comes in with her daughter to the emergency  room with the same complaints.  The daughter states that it is getting  even worse now, and she cannot even walk through her house, without  feeling debilitated, fatigue and dyspnea on exertion.  She has also had  some chest tightness recently and some dizziness and lightheadedness.  Her D-dimer was slightly elevated on admission, thus she went for a VQ  scan was negative for any pulmonary emboli.   LABORATORY:  Her labs showed that her CPK-MBs and troponins were  negative x3.  Her TSH was 4.057, hemoglobin A1c was 7.4, total  cholesterol is 177, triglycerides were 175, HDL was 28, LDL was 114.  His sodium was 139, potassium 3.9, BUN 28, creatinine 1.37, glucose was  238, albumin was 3.2, AST was 18, ALT was 15.  Her blood pressure on the  day of discharge was 109-124/62-79.  Her heart rates in the 60s to 70s.  She was walked in the hall.  She talked the whole time, and her O2 sats  did not drop down.  They were in the 90s, actually 96 to 99 on room air,  with  walking.  It was decided that she could be discharged home.  It was  felt that she should undergo cardiac catheterization.  This could be  done as an outpatient.  Thus, we will try to set that up on Monday  afternoon or Tuesday.   DISCHARGE MEDICATIONS:  Are:  1. Synthroid 88 mcg every day.  2. Metformin 1000 mg twice per day, morning, and night.  3. Metformin 500 mg at lunch.  4. Aspirin 81 mg, two per day.  5. Prilosec 20 mg twice per day.  6. Isosorbide mononitrate 15 mg a day.  7. Nitroglycerin 1/150, one under tongue every 5 minutes x3 for chest      pain.  8. She is not to take metformin on Monday morning.   DISCHARGE DIAGNOSES:  1. Dyspnea on exertion and fatigue, progressively worse to the point      of being dysfunctional.  2. Some mild episodes of chest tightness.  3. History of coronary artery disease, coronary artery bypass graft in      June 2004 with patent grafts in 2007.  4. Non-Insulin-dependent diabetes mellitus.  5. Hypothyroidism.  6. Remote history of breast cancer.  7. Hyperlipidemia.   ALLERGIES:  She is intolerant to Statin and does not take anything for  her cholesterol, except garlic.      Lezlie Octave, N.P.      Antonieta Iba, MD  Electronically Signed    BB/MEDQ  D:  02/17/2008  T:  02/17/2008  Job:  [id number]   cc:   Antonieta Iba, MD  [place], [place]

## 2011-04-08 NOTE — Op Note (Signed)
[name], [name]                 ACCOUNT NO.:  [id number]   MEDICAL RECORD NO.:  [id number]          PATIENT TYPE:  INP   LOCATION:  0002                         FACILITY:  [facility]   PHYSICIAN:  Harvie Junior, M.D.   DATE OF BIRTH:  1935/05/23   DATE OF PROCEDURE:  12/29/2004  DATE OF DISCHARGE:                                 OPERATIVE REPORT   PREOPERATIVE DIAGNOSIS:  End-stage degenerative joint disease, right knee.   POSTOPERATIVE DIAGNOSIS:  End-stage degenerative joint disease, right knee.   PROCEDURE:  1.  Right total knee replacement.  2.  Computer-assisted right total knee replacement.   SURGEON:  Harvie Junior, M.D.   ASSISTANT:  Marshia Ly, P.A.   ANESTHESIA:  General.   BRIEF HISTORY:  A 76 year old female with a long history of having had right  knee pain.  She ultimately had been treated with steroid injection,  _________ supplementation, arthroscopic surgery, activity modification.  None of this worked and because of continued complaints of right knee pain,  she was evaluated and felt to need total knee replacement.  She ultimately  came to [place] as a second opinion about the situation, and we concurred with  all of the treatment that she had, and ultimately, she asked [place] to do her  knee replacement.  She is brought to the operating room at this point for  her right total knee replacement.   PROCEDURE:  Patient is brought to the operating room.  Adequate anesthesia  was obtained ________.  Patient was placed on the operating room table.  Her  right leg was prepped and draped in a sterile fashion.  The patient had an  obvious insignificant varus rotational deformity, which had developed over  time.  The leg was prepped and draped in the usual sterile fashion at this  point.  The leg was exsanguinated with an esmarch bandage, and blood  pressure insufflated to 300 mmHg.  Following this, a midline incision was  made.  The subcutaneous tissue was dissected down  to the level of the  extensor mechanism.  A medial parapatellar arthrotomy was undertaken at this  point.  Medial release was undertaken, given the large varus deformity.  At  this point, medial and lateral meniscectomy was performed as well as  anterior and posterior cruciate excision as well as fat pad excision.  The  attention was turned towards placing pins for computer alignment.  I really  felt that we needed to go outside of the incision, given that we had made a  small incision on her, as we went outside the incision with the two distal  10's on the tibia and inside on the two proximal pins for the femur.  Once  these pins had been placed, attention was turned towards alignment of the  arrays.  At this point, the patient was still in about 7 degrees of varus  malalignment.  At this point, a larger varus release was undertaken to allow  for the knee to come back into neutral alignment.  We got her down to about  1 degree  of varus at this point.  The attention was then turned towards  using the jigs, and we cut the tibia perpendicular to the long axis under  computer assistance.  We then cut the distal femur perpendicular to its long  axis under computer assistance.  The tensioning gaps have been divided prior  to this, and all of the registration for the case was undertaken,  approximately 15 minutes of registration of the computer before beginning  the cuts.  Once the distal femoral cut and proximal tibial cut were made, I  put a spacer block in just to check, and it was perfect.  The attention was  then turned towards the rotational block for the anterior and posterior  cuts, and this was done under computer assistance, and the cuts were then  made.  Interestingly, there was a slight amount of sawing of the anterior  femoral cortex, and this was necessary with the external rotation of the  block, which it did, in fact, perfect the gaps.  We had translated the  component anteriorly  related to the plan to try to avoid this, but there  probably was 1 mm of a notch which was seen at this point.  Following this,  the box cut was made after the anterior and posterior chamfer cuts were  made, and attention was turned to the tibia, which was sized perfectly to a  2, and this was drilled and keeled to a 2.  The components were then put in  place as well as the 10 mm bridge-bearing, and excellent rotational  alignment had been achieved, easy full extension, in fact, a degree of  hyperextension.  Varus and valgus alignment was neutral.  At this point,  attention was turned to the patellar side, where the patella was sized to a  32 and cut down to 13 mm on the remaining bone, and lugs were drilled for  the patella.  At this point, the computer-assistance pins were pulled, and  the final components were then put in place and cemented into place as well  as the polyethylene  patella.  The size 2 femoral component, size 2 tibial  component, and the cement was allowed to harden.  A 10 mm rotating bearing  was then put in place, and the full range of motion was achieved easily with  excellent balance.  A medium Hemovac drain was placed.  Medial parapatellar  arthrotomy was closed with a #1 Vicryl running suture, and subcu was closed  with 0 and 2-0 Vicryl and the skin with a 3-0 Maxon subcuticular stitch.  Benzoin and Steri-Strips were applied.  The patient then had a compressive  dressing and knee immobilizer.  She was taken to the recovery room.  She was  noted to be in satisfactory condition.  Estimated blood loss for the  procedure was _________.      JLG/MEDQ  D:  12/29/2004  T:  12/29/2004  Job:  [id number]

## 2011-04-08 NOTE — Op Note (Signed)
NAMEARYANNAH, MOHON                 ACCOUNT NO.:  [id number]   MEDICAL RECORD NO.:  [id number]            PATIENT TYPE:   LOCATION:                                 FACILITY:   PHYSICIAN:  Mila Homer. Sherlean Foot, M.D.      DATE OF BIRTH:   DATE OF PROCEDURE:  11/23/2005  DATE OF DISCHARGE:                                 OPERATIVE REPORT   SURGEON:  Mila Homer. Sherlean Foot, M.D.   ASSISTANT:  Legrand Pitts. Duffy, P.A.   ANESTHESIA:  General.   PREOPERATIVE DIAGNOSIS:  Left knee osteoarthritis.   POSTOPERATIVE DIAGNOSIS:  Left knee osteoarthritis.   PROCEDURE:  Left total knee arthroplasty.   INDICATIONS FOR PROCEDURE:  The patient is a 76 year old white female with  osteoarthritis of the left knee. Informed consent was obtained.   DESCRIPTION OF PROCEDURE:  The patient was laid supine under general  anesthesia.  A Foley catheter was placed and the left leg was then prepped  and draped in the usual sterile fashion.  The extremity was exsanguinated  with the esmarch and the tourniquet was inflated to 250 mmHg.  Then I made a  midline incision with a #10 blade and had a new blade to make a median  parapatellar arthrotomy and performed a synovectomy.  I then elevated the  deep inseal off of the medial crest of the tibia around to the  semimembranous tendon.  I held this in place with a Z-retractor.  I then  inverted the patella measured at 22 mm thick.  I used a 32-mm reamer and  reamed down to 13 mm and then used a 32-mm patellar template to drill three  large holes, and with the patella component in place it measured 22-mm  again.  I then removed the prostatic component and went into flexion with a  Z-retractor medially.   I then used an extramedullary alignment guide to make a perpendicular cut to  the axis of the tibia with the sagittal saw.  I removed approximately 3 mm  of bone off the tibial articular surface.  Then I made an intramedullary  drill hole in the femur.  I placed the  intramedullary guide set on 63 valgus  cut.  This was a varus knee.  I then placed a distal femoral cutting block  into place, pinned it in place and made a distal femoral cut with a saggital  saw.  I then dropped an epicondylar access, __________ and compared that to  my tibial cut.  I then measured the posterior condylar angle to 3 degrees  and pinned the sizing block through the 3-degree external rotational holes.  Because of this, it measured a size C.  I then placed a 4-in-1 cutter in  place; and made the anterior, posterior, and champer cuts with the saggital  saw.  I removed the bony pieces.  I then put a spreader in the knee and  removed the medial and lateral meniscus.  The ACL and PCL and posterior  chondral osteophytes.   I then placed the 12-mm, special block in the  knee.  I had excellent flexion  and extension gap balance.  I then finished the femur with a size C  finishing block, drill and saw cutting the box and drilling the lugs.  I  then finished the tibia with a size 2 tibial tray, drill and keel and then  sized with a size 2 tibia, size C femur, size 12 poly insert, and 32-mm  patella.  The flexor tension gap balance was excellent.  The drop in angle  was back to beyond 125-degrees and the patella tracked beautifully.  At this  point, I then removed the trial components and copiously irrigated the knee.  I then cemented in the tibia first, the femur second, and patella third.  I  removed all excess cement and snapped in the __________ with the left knee  in extension as the cement hardened.  The Hemovac coming out superolaterally  and deep to the arthrotomy.  I closed the arthrotomy with interrupted #1  figure-of-eight Vicryl sutures.  This was done after the tourniquet was let  down and cauterized after hemostasis had been obtained.  I then closed the  soft tissues with interrupted #0 Vicryl, subcuticular 2-0 Vicryl in the  subcuticular layer and skin staples.    COMPLICATIONS:  None.   DRAINS:  One Hemovac.   ESTIMATED BLOOD LOSS:  500 mL.   DRESSINGS:  Xeroform dressing, sponge and ABDs, and a TED stocking.           ______________________________  Mila Homer. Sherlean Foot, M.D.     SDL/MEDQ  D:  11/23/2005  T:  11/23/2005  Job:  [id number]

## 2011-04-08 NOTE — Cardiovascular Report (Signed)
NAMEJANIENE, AARONS                 ACCOUNT NO.:  [id number]   MEDICAL RECORD NO.:  [id number]          PATIENT TYPE:  OIB   LOCATION:  2899                         FACILITY:  [facility]   PHYSICIAN:  Thereasa Solo. Little, M.D. DATE OF BIRTH:  Jan 25, 1935   DATE OF PROCEDURE:  03/01/2006  DATE OF DISCHARGE:                              CARDIAC CATHETERIZATION   This 76 year old female had had bypass surgery in 2004, had a Cardiolite  January 2006 as part of preoperative clearance for knee replacement that was  negative and showed a normal ejection fraction.  She came in February 02, 2006,  complaining of severe shortness of breath and her oxygen saturations dropped  to 85% with walking.  She was sent for a CT scan of her chest to rule out  pulmonary emboli and this was unremarkable.  She continues to have shortness  of breath.  Because of this she is brought in for a cardiac catheterization  right and left and graft visualization.   After obtaining informed consent, the patient was prepped and draped in the  usual sterile fashion exposing her right groin.  After local anesthetic 1%  Xylocaine the Seldinger technique was employed and the use of a SMART needle  allowed me to use the Seldinger technique to place a 7-French sheath into  the right femoral vein and a 5-French sheath in the right femoral artery.   A Swan-Ganz catheter was advanced through its normal route into the  pulmonary artery with hemodynamic monitoring undertaken throughout each  station.  Cardiac output by thermal dilution was then performed.   A pigtail catheter was then advanced into the left ventricular cavity and  hemodynamic monitoring of both the left ventricle and the wedge position was  obtained.  Ventriculography in the RAO projection, left coronary  arteriography, right coronary arteriography, and graft visualization was  performed.   COMPLICATIONS:  None.   The patient complained of back and leg pain prior to and  during the  procedure.  She received IV Versed and fentanyl for this.   EQUIPMENT:  A 7-French Swan catheter, a 5-French diagnostic catheter, and  internal mammary artery was selectively engaged as were the saphenous vein  grafts with a JR-4 catheter.   TOTAL CONTRAST:  105 mL   RESULTS:  Hemodynamic monitoring:  Right atrial pressure 5, right  ventricular pressure 24/0, pulmonary artery pressure 23/10, wedge was 6.   Left ventricular pressure was 163/4, central aortic pressure was 163/66, and  there was no aortic valve gradient noted at the time of pullback.   Oxygen saturations on room air were 94% and in the PA 95%.  Fick cardiac  output 3.3.  Cardiac index 2.0.  Thermodilution cardiac output 6.5.  Cardiac  index 3.4.   Ventriculography:  Ventriculography in the RAO projection x2 was performed.  The first ventriculogram was associated with marked ventricular ectopy.   The second ventriculogram showed the inferior wall to be markedly  hypokinetic.  The apex and anterior wall appeared to be mildly hypokinetic  but despite this ejection fraction was 50%.  PVCs were  noted during  ventriculogram.  The end-diastolic pressure was 12.   Coronary arteriography:  On fluoroscopy, stents seen in the distribution of  the LAD.  1.  Left main:  It bifurcated.  2.  Circumflex:  The circumflex gave rise to single OM vessel that was free      of disease.  3.  LAD:  There was a stent in the proximal portion of the LAD.  The stents      were widely patent.  There was bidirectional flow in the distal portion      of the LAD.  Proximal to the stent, however, was a hyperlucent area that      was about 40-50% narrowed.  It should be pointed out that the diagonal      was not visualized by the native circulation.  4.  Right coronary artery:  The right coronary had a long proximal 60% area      of narrowing with total occlusion of the right coronary artery at acute      margin of the heart.    Grafts:  1.  Saphenous vein graft to the diagonal:  The graft was widely patent with      valves in the proximal segment.  The diagonal was small.  Free of      disease.  2.  Saphenous vein graft to the distal RCA:  The graft had several veins      proximally and some minimal irregularities.  The distal RCA, PDA, and      posterolateral vessels were well visualized and free of disease.  3.  Internal mammary artery to the LAD:  Internal mammary artery was widely      patent.  It inserted into the mid LAD.  The LAD itself was small with      some bidirectional flow noted.  There was no significant obstruction.   CONCLUSION:  1.  Low normal left ventricular systolic function.  2.  Normal pulmonary artery pressures.  3.  Patent grafts.   I cannot explain her dyspnea based on these hemodynamic or cardiac  catheterization findings.  It should be pointed out that her pulmonary  vascular resistance is 218.   Will refer to pulmonary for opinion.           ______________________________  Thereasa Solo Little, M.D.     ABL/MEDQ  D:  03/01/2006  T:  03/01/2006  Job:  [id number]   cc:   Lonie Peak   Cath Lab   [facility]

## 2011-04-25 ENCOUNTER — Emergency Department (HOSPITAL_COMMUNITY): Payer: Medicare Other

## 2011-04-25 ENCOUNTER — Inpatient Hospital Stay (HOSPITAL_COMMUNITY)
Admission: EM | Admit: 2011-04-25 | Discharge: 2011-04-27 | DRG: 683 | Disposition: A | Discharge status: Home / Self Care | Payer: Medicare Other | Attending: Internal Medicine | Admitting: Internal Medicine

## 2011-04-25 DIAGNOSIS — E875 Hyperkalemia: Secondary | ICD-10-CM | POA: Diagnosis present

## 2011-04-25 DIAGNOSIS — Z8673 Personal history of transient ischemic attack (TIA), and cerebral infarction without residual deficits: Secondary | ICD-10-CM

## 2011-04-25 DIAGNOSIS — N179 Acute kidney failure, unspecified: Principal | ICD-10-CM | POA: Diagnosis present

## 2011-04-25 DIAGNOSIS — N182 Chronic kidney disease, stage 2 (mild): Secondary | ICD-10-CM | POA: Diagnosis present

## 2011-04-25 DIAGNOSIS — Z951 Presence of aortocoronary bypass graft: Secondary | ICD-10-CM

## 2011-04-25 DIAGNOSIS — I2510 Atherosclerotic heart disease of native coronary artery without angina pectoris: Secondary | ICD-10-CM | POA: Diagnosis present

## 2011-04-25 DIAGNOSIS — E039 Hypothyroidism, unspecified: Secondary | ICD-10-CM | POA: Diagnosis present

## 2011-04-25 DIAGNOSIS — E119 Type 2 diabetes mellitus without complications: Secondary | ICD-10-CM | POA: Diagnosis present

## 2011-04-25 DIAGNOSIS — I129 Hypertensive chronic kidney disease with stage 1 through stage 4 chronic kidney disease, or unspecified chronic kidney disease: Secondary | ICD-10-CM | POA: Diagnosis present

## 2011-04-25 DIAGNOSIS — I5022 Chronic systolic (congestive) heart failure: Secondary | ICD-10-CM | POA: Diagnosis present

## 2011-04-25 DIAGNOSIS — I509 Heart failure, unspecified: Secondary | ICD-10-CM | POA: Diagnosis present

## 2011-04-25 DIAGNOSIS — Z853 Personal history of malignant neoplasm of breast: Secondary | ICD-10-CM

## 2011-04-25 LAB — URINALYSIS, ROUTINE W REFLEX MICROSCOPIC
Bilirubin Urine: NEGATIVE
Hgb urine dipstick: NEGATIVE
Ketones, ur: NEGATIVE mg/dL
Nitrite: NEGATIVE
pH: 5 (ref 5.0–8.0)

## 2011-04-25 LAB — BASIC METABOLIC PANEL
CO2: 15 mEq/L — ABNORMAL LOW (ref 19–32)
Calcium: 8.8 mg/dL (ref 8.4–10.5)
Chloride: 108 mEq/L (ref 96–112)
Glucose, Bld: 145 mg/dL — ABNORMAL HIGH (ref 70–99)
Sodium: 139 mEq/L (ref 135–145)

## 2011-04-25 LAB — PHOSPHORUS: Phosphorus: 3.8 mg/dL (ref 2.3–4.6)

## 2011-04-25 LAB — DIFFERENTIAL
Lymphocytes Relative: 26 % (ref 12–46)
Lymphs Abs: 1.7 10*3/uL (ref 0.7–4.0)
Neutrophils Relative %: 65 % (ref 43–77)

## 2011-04-25 LAB — COMPREHENSIVE METABOLIC PANEL
Albumin: 3.9 g/dL (ref 3.5–5.2)
Alkaline Phosphatase: 91 U/L (ref 39–117)
BUN: 58 mg/dL — ABNORMAL HIGH (ref 6–23)
Chloride: 103 mEq/L (ref 96–112)
Glucose, Bld: 124 mg/dL — ABNORMAL HIGH (ref 70–99)
Potassium: 6 mEq/L — ABNORMAL HIGH (ref 3.5–5.1)
Total Bilirubin: 0.2 mg/dL — ABNORMAL LOW (ref 0.3–1.2)

## 2011-04-25 LAB — CBC
HCT: 39.8 % (ref 36.0–46.0)
MCV: 92.8 fL (ref 78.0–100.0)
RBC: 4.29 MIL/uL (ref 3.87–5.11)
WBC: 6.7 10*3/uL (ref 4.0–10.5)

## 2011-04-25 LAB — POTASSIUM: Potassium: 6.8 mEq/L (ref 3.5–5.1)

## 2011-04-25 LAB — LIPASE, BLOOD: Lipase: 133 U/L — ABNORMAL HIGH (ref 11–59)

## 2011-04-26 DIAGNOSIS — N289 Disorder of kidney and ureter, unspecified: Secondary | ICD-10-CM

## 2011-04-26 LAB — RENAL FUNCTION PANEL
Albumin: 3 g/dL — ABNORMAL LOW (ref 3.5–5.2)
BUN: 42 mg/dL — ABNORMAL HIGH (ref 6–23)
Calcium: 8.7 mg/dL (ref 8.4–10.5)
Chloride: 110 mEq/L (ref 96–112)
Creatinine, Ser: 1.71 mg/dL — ABNORMAL HIGH (ref 0.4–1.2)
GFR calc Af Amer: 35 mL/min — ABNORMAL LOW (ref >60)
GFR calc non Af Amer: 29 mL/min — ABNORMAL LOW (ref >60)
Phosphorus: 2.9 mg/dL (ref 2.3–4.6)

## 2011-04-26 LAB — GLUCOSE, CAPILLARY: Glucose-Capillary: 148 mg/dL — ABNORMAL HIGH (ref 70–99)

## 2011-04-26 LAB — TSH: TSH: 1.637 u[IU]/mL (ref 0.350–4.500)

## 2011-04-26 LAB — HEMOGLOBIN A1C
Hgb A1c MFr Bld: 6.9 % — ABNORMAL HIGH (ref <5.7)
Mean Plasma Glucose: 151 mg/dL — ABNORMAL HIGH (ref <117)

## 2011-04-27 LAB — CBC
HCT: 31.6 % — ABNORMAL LOW (ref 36.0–46.0)
MCV: 91.6 fL (ref 78.0–100.0)
Platelets: 185 10*3/uL (ref 150–400)
RBC: 3.45 MIL/uL — ABNORMAL LOW (ref 3.87–5.11)
RDW: 13.7 % (ref 11.5–15.5)
WBC: 4.2 10*3/uL (ref 4.0–10.5)

## 2011-04-27 LAB — RENAL FUNCTION PANEL
Albumin: 3.3 g/dL — ABNORMAL LOW (ref 3.5–5.2)
BUN: 21 mg/dL (ref 6–23)
Chloride: 109 mEq/L (ref 96–112)
Phosphorus: 3.6 mg/dL (ref 2.3–4.6)
Potassium: 4.4 mEq/L (ref 3.5–5.1)
Sodium: 144 mEq/L (ref 135–145)

## 2011-04-27 LAB — GLUCOSE, CAPILLARY: Glucose-Capillary: 135 mg/dL — ABNORMAL HIGH (ref 70–99)

## 2011-05-07 NOTE — Consult Note (Signed)
[name], [name]                ACCOUNT NO.:  [id number]  MEDICAL RECORD NO.:  [id number]  LOCATION:  1416                         FACILITY:  [facility]  PHYSICIAN:  Cecille Aver, M.D.DATE OF BIRTH:  1935-04-19  DATE OF CONSULTATION:  04/25/2011 DATE OF DISCHARGE:                                CONSULTATION   REFERRED BY:  [facility] hospitalist.  REASON FOR CONSULTATION:  Acute-on-chronic renal failure and hyperkalemia.  HISTORY OF PRESENT ILLNESS:  [name] is a 76 year old white female with past medical history significant for type 2 diabetes mellitus, coronary artery disease status post CABG in 2004, and also breast cancer status post mastectomy with chemotherapy including Adriamycin, which finished in August 2011.  The patient also seems to have CKD with last creatinine in December 2011 at 1.6.  She presents to the emergency department with weakness and poor p.o. intake for 4-5 days.  As stated above, the patient "finished chemo" in August 2011, was very worried that "her cancer is back."  Apparently 2 weeks ago, was mowing the lawn and doing housework, has had progressive symptoms of dizziness, weakness in the legs and poor p.o. intake as well as nausea and vomiting over the last week.  Overall, the patient has lost weight since last year.  She notes decreased urine output as well. Of note, she was on Lasix and lisinopril prior to admission.  Labs in the emergency department show BUN and creatinine 58 and 3.01 with previous one being 33 and 1.63 as well as a potassium of 6.0 with a recheck of 6.8, which has been treated with Kayexalate and insulin. Urinalysis looks completely bland.  Family is at bedside.  They are sending mixed signals as she is too weak to walk one room to another, but then expected this to be an outpatient treatment situation.  PAST MEDICAL HISTORY: 1. Coronary artery disease status post CABG in 2004, still present     coronary artery  disease. 2. Type 2 diabetes mellitus, she is on metformin. 3. Breast cancer, status post mastectomy and chemotherapy. 4. Hypothyroidism. 5. Previous anemia.  CURRENT MEDICATIONS: 1. Lisinopril 10 mg daily. 2. Imdur 30 mg daily. 3. Metoprolol 225 mg b.i.d. 4. Aspirin, full dose. 5. Lasix 20 mg daily. 6. Metformin 500 mg 5 tablets daily. 7. Synthroid 88 mcg daily. 8. The patient denies any nonsteroidal anti-inflammatory drugs.  ALLERGIES:  No known drug allergies.  SOCIAL HISTORY:  The patient lives with her husband who is in reasonable health.  She was previously doing yard work.  She denies tobacco, alcohol, or any drug use.  FAMILY HISTORY:  Family history is positive for diabetes, but negative for kidney disease.  REVIEW OF SYSTEMS:  Positive for weight loss, dizziness, poor p.o. intake, nausea, vomiting, leg weakness and decreased urine output. Negative for edema, fever, chills.  Otherwise review of systems is negative.  PHYSICAL EXAMINATION:  VITAL SIGNS:  The patient is afebrile, heart rate 57.  Heart rate does not reveal any peak T-waves, blood pressure 160/90, respirations normal, oxygen saturation is greater than 93% on room air. GENERAL:  The patient looks slightly weak, but reasonably healthy. HEENT:  Pupils are equal, round and  reactive to light, and mucous membranes are dry. NECK:  There is no jugular venous distention. LUNGS:  Clear to auscultation bilaterally. CARDIOVASCULAR:  Bradycardic. ABDOMEN:  Positive bowel sounds, soft, nontender, nondistended. EXTREMITIES:  Reveal no edema.  LABORATORY DATA:  Urinalysis is bland and is negative for blood or protein, potassium rechecked with 6.8, lipase 133, BUN and creatinine 58 and 3.01, bicarb 15, albumin 3.9, calcium 9.5.  White blood count 6.7, hemoglobin 13.1.  Chest x-ray is completely negative.  ASSESSMENT:  A 76 year old white female with acute-on-chronic renal failure and hyperkalemia in the setting of  weight loss, dizziness, volume depletion with lisinopril and Lasix treatment. 1. Acute-on-chronic renal failure and hyperkalemia.  I suspect     secondary to lisinopril treatment in the setting of volume     depletion.  Bland urinalysis argues against some type of primary     renal problem.  I agree with holding lisinopril and Lasix, and     hydrating and watching closely.  Regarding hyperkalemia, was given     Kayexalate and insulin.  We will change IV fluids to bicarb and     give Kayexalate  p.r.n. for continued hyperkalemia. 2. We would hold metformin as it contraindicated with creatinine of 3. 3. Acidosis.  We will give bicarb drip.  Thank you for the consultation.  We will follow with you.          ______________________________ Cecille Aver, M.D.     KAG/MEDQ  D:  04/25/2011  T:  04/26/2011  Job:  [id number]  Electronically Signed by Annie Sable M.D. on 05/07/2011 12:01:14 PM

## 2011-05-13 ENCOUNTER — Inpatient Hospital Stay (HOSPITAL_COMMUNITY)
Admission: EM | Admit: 2011-05-13 | Discharge: 2011-05-16 | DRG: 065 | Disposition: A | Discharge status: Home Health | Payer: Medicare Other | Attending: Internal Medicine | Admitting: Internal Medicine

## 2011-05-13 ENCOUNTER — Encounter: Payer: Self-pay | Admitting: Internal Medicine

## 2011-05-13 ENCOUNTER — Emergency Department (HOSPITAL_COMMUNITY): Payer: Medicare Other

## 2011-05-13 DIAGNOSIS — I509 Heart failure, unspecified: Secondary | ICD-10-CM | POA: Diagnosis present

## 2011-05-13 DIAGNOSIS — I6350 Cerebral infarction due to unspecified occlusion or stenosis of unspecified cerebral artery: Secondary | ICD-10-CM

## 2011-05-13 DIAGNOSIS — E039 Hypothyroidism, unspecified: Secondary | ICD-10-CM | POA: Diagnosis present

## 2011-05-13 DIAGNOSIS — I2510 Atherosclerotic heart disease of native coronary artery without angina pectoris: Secondary | ICD-10-CM | POA: Diagnosis present

## 2011-05-13 DIAGNOSIS — Z79899 Other long term (current) drug therapy: Secondary | ICD-10-CM

## 2011-05-13 DIAGNOSIS — Z853 Personal history of malignant neoplasm of breast: Secondary | ICD-10-CM

## 2011-05-13 DIAGNOSIS — I6330 Cerebral infarction due to thrombosis of unspecified cerebral artery: Principal | ICD-10-CM | POA: Diagnosis present

## 2011-05-13 DIAGNOSIS — E785 Hyperlipidemia, unspecified: Secondary | ICD-10-CM | POA: Diagnosis present

## 2011-05-13 DIAGNOSIS — Z8673 Personal history of transient ischemic attack (TIA), and cerebral infarction without residual deficits: Secondary | ICD-10-CM

## 2011-05-13 DIAGNOSIS — Z86718 Personal history of other venous thrombosis and embolism: Secondary | ICD-10-CM

## 2011-05-13 DIAGNOSIS — G459 Transient cerebral ischemic attack, unspecified: Secondary | ICD-10-CM

## 2011-05-13 DIAGNOSIS — I129 Hypertensive chronic kidney disease with stage 1 through stage 4 chronic kidney disease, or unspecified chronic kidney disease: Secondary | ICD-10-CM | POA: Diagnosis present

## 2011-05-13 DIAGNOSIS — Z860100 Personal history of colon polyps, unspecified: Secondary | ICD-10-CM

## 2011-05-13 DIAGNOSIS — Z96659 Presence of unspecified artificial knee joint: Secondary | ICD-10-CM

## 2011-05-13 DIAGNOSIS — G8190 Hemiplegia, unspecified affecting unspecified side: Secondary | ICD-10-CM | POA: Diagnosis present

## 2011-05-13 DIAGNOSIS — I252 Old myocardial infarction: Secondary | ICD-10-CM

## 2011-05-13 DIAGNOSIS — M1710 Unilateral primary osteoarthritis, unspecified knee: Secondary | ICD-10-CM | POA: Diagnosis present

## 2011-05-13 DIAGNOSIS — I5032 Chronic diastolic (congestive) heart failure: Secondary | ICD-10-CM | POA: Diagnosis present

## 2011-05-13 DIAGNOSIS — Z794 Long term (current) use of insulin: Secondary | ICD-10-CM

## 2011-05-13 DIAGNOSIS — Z9221 Personal history of antineoplastic chemotherapy: Secondary | ICD-10-CM

## 2011-05-13 DIAGNOSIS — Z9861 Coronary angioplasty status: Secondary | ICD-10-CM

## 2011-05-13 DIAGNOSIS — Z951 Presence of aortocoronary bypass graft: Secondary | ICD-10-CM

## 2011-05-13 DIAGNOSIS — E119 Type 2 diabetes mellitus without complications: Secondary | ICD-10-CM | POA: Diagnosis present

## 2011-05-13 DIAGNOSIS — Z923 Personal history of irradiation: Secondary | ICD-10-CM

## 2011-05-13 DIAGNOSIS — R4789 Other speech disturbances: Secondary | ICD-10-CM | POA: Diagnosis present

## 2011-05-13 DIAGNOSIS — Z7902 Long term (current) use of antithrombotics/antiplatelets: Secondary | ICD-10-CM

## 2011-05-13 DIAGNOSIS — N182 Chronic kidney disease, stage 2 (mild): Secondary | ICD-10-CM | POA: Diagnosis present

## 2011-05-13 DIAGNOSIS — Z9010 Acquired absence of unspecified breast and nipple: Secondary | ICD-10-CM

## 2011-05-13 DIAGNOSIS — I369 Nonrheumatic tricuspid valve disorder, unspecified: Secondary | ICD-10-CM

## 2011-05-13 LAB — CK TOTAL AND CKMB (NOT AT ARMC)
CK, MB: 3.1 ng/mL (ref 0.3–4.0)
Relative Index: 2.7 — ABNORMAL HIGH (ref 0.0–2.5)
Total CK: 112 U/L (ref 7–177)

## 2011-05-13 LAB — POCT I-STAT, CHEM 8
Creatinine, Ser: 1.2 mg/dL — ABNORMAL HIGH (ref 0.50–1.10)
HCT: 31 % — ABNORMAL LOW (ref 36.0–46.0)
Hemoglobin: 10.5 g/dL — ABNORMAL LOW (ref 12.0–15.0)
Potassium: 4 mEq/L (ref 3.5–5.1)
Sodium: 144 mEq/L (ref 135–145)
TCO2: 22 mmol/L (ref 0–100)

## 2011-05-13 LAB — URINALYSIS, ROUTINE W REFLEX MICROSCOPIC
Ketones, ur: NEGATIVE mg/dL
Leukocytes, UA: NEGATIVE
Nitrite: NEGATIVE
Protein, ur: NEGATIVE mg/dL
pH: 6 (ref 5.0–8.0)

## 2011-05-13 LAB — COMPREHENSIVE METABOLIC PANEL
Albumin: 3.3 g/dL — ABNORMAL LOW (ref 3.5–5.2)
Alkaline Phosphatase: 92 U/L (ref 39–117)
BUN: 23 mg/dL (ref 6–23)
Creatinine, Ser: 1.12 mg/dL — ABNORMAL HIGH (ref 0.50–1.10)
GFR calc Af Amer: 58 mL/min — ABNORMAL LOW (ref >60)
Glucose, Bld: 122 mg/dL — ABNORMAL HIGH (ref 70–99)
Potassium: 4.1 mEq/L (ref 3.5–5.1)
Total Bilirubin: 0.4 mg/dL (ref 0.3–1.2)
Total Protein: 6.7 g/dL (ref 6.0–8.3)

## 2011-05-13 LAB — CARDIAC PANEL(CRET KIN+CKTOT+MB+TROPI)
Relative Index: 2.4 (ref 0.0–2.5)
Total CK: 109 U/L (ref 7–177)
Troponin I: <0.3 ng/mL (ref <0.30)

## 2011-05-13 LAB — DIFFERENTIAL
Basophils Absolute: 0 10*3/uL (ref 0.0–0.1)
Basophils Relative: 1 % (ref 0–1)
Lymphocytes Relative: 28 % (ref 12–46)
Neutro Abs: 2.3 10*3/uL (ref 1.7–7.7)
Neutrophils Relative %: 58 % (ref 43–77)

## 2011-05-13 LAB — CBC
HCT: 31.3 % — ABNORMAL LOW (ref 36.0–46.0)
Hemoglobin: 10.7 g/dL — ABNORMAL LOW (ref 12.0–15.0)
MCHC: 34.2 g/dL (ref 30.0–36.0)
RBC: 3.43 MIL/uL — ABNORMAL LOW (ref 3.87–5.11)
WBC: 4 10*3/uL (ref 4.0–10.5)

## 2011-05-13 LAB — APTT: aPTT: 27 seconds (ref 24–37)

## 2011-05-13 LAB — PROTIME-INR
INR: 0.99 (ref 0.00–1.49)
Prothrombin Time: 13.3 seconds (ref 11.6–15.2)

## 2011-05-13 LAB — HEMOGLOBIN A1C: Mean Plasma Glucose: 171 mg/dL — ABNORMAL HIGH (ref <117)

## 2011-05-13 LAB — GLUCOSE, CAPILLARY: Glucose-Capillary: 238 mg/dL — ABNORMAL HIGH (ref 70–99)

## 2011-05-13 NOTE — Progress Notes (Unsigned)
Hospital Admission Note Date: 05/13/2011  Patient name: [name] Medical record number: [id number] Date of birth: Nov 25, 1934 Age: 76 y.o. Gender: female PCP: No primary provider on file.  Medical Service:  Attending physician:   Dr. Margarito Liner Resident [phone]):   Dr. Scot Dock [phone] Resident (R1):   Dr. Saralyn Pilar [phone]  Chief Complaint: right sided weakness and numbness  History of Present Illness: Patient is 75 year old female with history of coronary artery disease, status post CABG, congestive heart failure, grade 2 diastolic dysfunction who presents to [facility] ED this morning with main concern of sudden onset right-sided weakness, right upper extremity greater than right lower extremity, slurred speech, and generalized weakness. Per patient's husband the symptoms were noted approximately 5 AM morning of admission. Patient arrived to [facility] ED at approximately 7:15 AM. Upon arrival patient's symptoms have improved and patient reports improved speech, less weakness on the right side, but persistent right-sided numbness including the face, arm, and leg. Patient has history of strokes however tells me that she cannot remember her symptoms at that time. At baseline she reports being functional and independent, able to carry out activities of daily living without assistance. Patient reports being recently admitted beginning of June for dehydration and kidney failure but her symptoms resolved. She denies chest pain, shortness of breath, orthopnea, no abdominal or urinary concerns, no blood in urine or stool, no visual changes, no recent changes in weight.[name]  DISCHARGE MEDICATIONS:    1. Enteric-coated aspirin 325 mg daily.   2. Levemir 10 units subcu at bedtime.   3. Imdur 30 mg daily.   4. Lasix 20 mg q.a.m. p.r.n.   5. Levothyroxine 88 mcg daily.   6. Metoprolol tartrate 25 mg p.o. b.i.d.   Allergies: NKDA  PAST MEDICAL HISTORY:  1. Chronic systolic congestive heart  failure, 2D ECHO Oct 2011 - EF 35% and Grade II diastolic dysfunction  2. History of coronary artery disease, status CABG  3. History of renal artery stenosis, status post left renal artery stenting.   4. History of cerebrovascular accident.   5. Hypertension.   6. Type 2 diabetes.   7. History of breast cancer, status post mastectomy and chemotherapy       completed in August 2011.   8. Chronic kidney disease 2.   9. Hypothyroidism.   10. S/P cardiac cath 11-2010: 40% ejection fraction with anterior and distal inferior wall motion abnormalities, likely secondary to Adriamycin induced.         Progression of diagonal and LAD disease, predominantly small vessel disease consistent with her diabetes.   History   Social History  . Marital Status: Married   Social History Main Topics  . Smoking status: Denies  . Alcohol Use: Denies  . Drug Use: Denies   Review of Systems: Constitutional: Denies fever, chills, diaphoresis, appetite change and fatigue.  HEENT: Denies photophobia, eye pain, redness, hearing loss, ear pain, congestion, sore throat, rhinorrhea, sneezing, mouth sores, trouble swallowing, neck pain, neck stiffness and tinnitus.  Respiratory: Denies SOB, DOE, cough, chest tightness, and wheezing.  Cardiovascular: Denies chest pain, palpitations and leg swelling.  Gastrointestinal: Denies nausea, vomiting, abdominal pain, diarrhea, constipation, blood in stool and abdominal distention.  Genitourinary: Denies dysuria, urgency, frequency, hematuria, flank pain and difficulty urinating.  Musculoskeletal: Denies myalgias, back pain, joint swelling, arthralgias and gait problem.  Skin: Denies pallor, rash and wound.  Neurological: Positive for numbness on the right side, face, arm, and leg, generalized weakness, denies dizziness, seizures, syncope,  light-headedness. Hematological: Denies adenopathy. Easy bruising, personal or family bleeding history  Psychiatric/Behavioral: Denies  suicidal ideation, mood changes, confusion, nervousness, sleep disturbance and agitation  Physical Exam: Temperature 98.1 Fahrenheit Blood pressure 165/67 Heart rate 57 Respiration 17 Oxygen saturation = 100% on room air  Constitutional: Vital signs reviewed.  Patient is a well-developed and well-nourished in no acute distress and cooperative with exam.  Head: Normocephalic and atraumatic Ear: TM normal bilaterally Mouth: no erythema or exudates, dry mucous membranes Eyes: PERRL, EOMI, conjunctivae normal, No scleral icterus.  Neck: Supple, Trachea midline normal ROM, No JVD, mass, thyromegaly, or carotid bruit present.  Cardiovascular: regular rate, bradycardic, S1 normal, S2 normal, no MRG, pulses symmetric and intact bilaterally Pulmonary/Chest: CTAB, no wheezes, rales, or rhonchi Abdominal: Soft. Non-tender, non-distended, bowel sounds are normal, no masses, organomegaly, or guarding present.  GU: no CVA tenderness Musculoskeletal: No joint deformities, erythema, or stiffness, ROM full and no nontender Hematology: no cervical, inginal, or axillary adenopathy.  Neurological: Mental status: Patient is alert and oriented x3, speech is slightly slow but clear, no dysarthria or aphasia noted Cranial nerves: II - visual fields full, discs flat bilaterally, III/IV/VI - EOMI, V/VII - sensation impaired on the right side V1-V3, left facial droop noted, face is otherwise symmetric, VIII - hearing intact, IX/X/XI/XII - gag present, shoulder shrug equal bilaterally, tongue is midline Motor: Strength is 5 out of 5 in bilateral upper extremity on flexion/extension/abduction/adduction, hand grip 5 out of 5 bilateral Sensory: Intact sensation on the right side, intact sensation on the left Cerebellar: Intact finger-to-nose and heel-to-shin bilaterally, no ataxia DTR: +2 throughout, plantars mute bilaterally  Lab results:   WBC                                      4.0               4.0-10.5          K/uL  RBC                                      3.43       l      3.87-5.11        MIL/uL  Hemoglobin (HGB)                         10.7       l      12.0-15.0        g/dL  Hematocrit (HCT)                         31.3       l      36.0-46.0        %  MCV                                      91.3              78.0-100.0       fL  Platelet Count (PLT)                     193  150-400          K/uL    Protime (Prothrombin Time)               13.3              11.6-15.2        seconds  INR                                      0.99              0.00-1.49  PTT(a-Partial Thromboplastn Time)        27                24-37            seconds    Sodium (NA)                              143               135-145          mEq/L  Potassium (K)                            4.1               3.5-5.1          mEq/L  Chloride                                 112               96-112           mEq/L  CO2                                      23                19-32            mEq/L  Glucose                                  122        h      70-99            mg/dL  BUN                                      23                6-23             mg/dL  Creatinine                               1.12       h      0.50-1.10        mg/dL  GFR, Est Non African American            48  l      >60              mL/min    Bilirubin, Total                         0.4               0.3-1.2          mg/dL  Alkaline Phosphatase                     92                39-117           U/L  SGOT (AST)                               15                0-37             U/L  SGPT (ALT)                               16                0-35             U/L  Total  Protein                           6.7               6.0-8.3          g/dL  Albumin-Blood                            3.3        l      3.5-5.2          g/dL  Calcium                                  8.8               8.4-10.5         mg/dL    Creatine Kinase, Total                    116               7-177            U/L  CK, MB                                   3.1               0.3-4.0          ng/mL  Relative Index                           2.7        h      0.0-2.5  Troponin I                               <  0.30             <0.30            Ng/mL  Imaging results:   MRI of the brain:   Acute infarct left lateral thalamus and internal capsule.   Atrophy and chronic ischemia in the frontal lobes and white matter   Bilaterally.  CT of the head:   1.  No acute finding.   2.  Remote left frontal infarct and chronic microvascular ischemic   change.  Assessment & Plan by Problem:  1) Right sided weakness - physical exam findings as well as MRI confirms left thalamic infarction. Patient numerous risk factors for stroke including history of previous strokes, coronary artery disease status post CABG, hypertension, hyperlipidemia, diabetes. She has been on aspirin at home, full dose. She was also on Plavix in the past but that was discontinued, per chart review in 2011. TPA was not given because patient's symptoms were resolving quickly. Plan: - Admit patient to neurology floor, telemetry and monitor vitals - Neuro checks every 4 hours - Stroke workup which includes following: Bedside swallow evaluation, PT/OT, 2-D echo, carotid Dopplers, fasting lipid panel, A1C - Followup on admission labs - Ensure bedrest for now with fall precaution - Aspirin 300 mg when necessary and change to by mouth on passes bedside swallow evaluation  2) HTN - hold home BP medications for now and resume once able to take PO 3) Hypothyroidism - follow on TSH and resume home Synthroid once able to tolerate PO 4) DVT prophylaxis - Lovenox 30 mg sub Q daily  [facility]

## 2011-05-14 ENCOUNTER — Encounter: Payer: Self-pay | Admitting: Internal Medicine

## 2011-05-14 ENCOUNTER — Inpatient Hospital Stay (HOSPITAL_COMMUNITY): Payer: Medicare Other

## 2011-05-14 LAB — BASIC METABOLIC PANEL
Calcium: 9 mg/dL (ref 8.4–10.5)
GFR calc Af Amer: 58 mL/min — ABNORMAL LOW (ref >60)
GFR calc non Af Amer: 48 mL/min — ABNORMAL LOW (ref >60)
Potassium: 4.1 mEq/L (ref 3.5–5.1)
Sodium: 141 mEq/L (ref 135–145)

## 2011-05-14 LAB — GLUCOSE, CAPILLARY: Glucose-Capillary: 211 mg/dL — ABNORMAL HIGH (ref 70–99)

## 2011-05-14 LAB — CBC
HCT: 34.1 % — ABNORMAL LOW (ref 36.0–46.0)
Hemoglobin: 11.4 g/dL — ABNORMAL LOW (ref 12.0–15.0)
RBC: 3.74 MIL/uL — ABNORMAL LOW (ref 3.87–5.11)

## 2011-05-14 LAB — CARDIAC PANEL(CRET KIN+CKTOT+MB+TROPI)
CK, MB: 2.3 ng/mL (ref 0.3–4.0)
Relative Index: INVALID (ref 0.0–2.5)
Total CK: 90 U/L (ref 7–177)

## 2011-05-14 LAB — LIPID PANEL
Cholesterol: 249 mg/dL — ABNORMAL HIGH (ref 0–200)
Triglycerides: 126 mg/dL (ref <150)

## 2011-05-14 NOTE — Progress Notes (Signed)
Addendum to Hospital Admission Note (05/13/2011)   Patient name:  [name]  Medical record number:  [id number] Date of birth:  09-02-1935   Age: 76 y.o. Gender:  female PCP:    HAMRICK,MAURA L, MD    Current Outpatient Medications: Medication Sig  . aspirin 325 MG EC tablet Take 325 mg by mouth daily.    . furosemide (LASIX) 20 MG tablet Take 20 mg by mouth daily as needed.    . insulin detemir (LEVEMIR) 100 UNIT/ML injection Inject into the skin at bedtime. Inject 14-16 units subcutaneously qHS.   [name] levothyroxine (SYNTHROID, LEVOTHROID) 88 MCG tablet Take 88 mcg by mouth daily.    . metoprolol tartrate (LOPRESSOR) 25 MG tablet Take 25 mg by mouth 2 (two) times daily.      Allergies: No Known Allergies  Past Medical History: Diagnosis Date  . Chronic congestive heart failure     Mixed. 2D-echocardiogram (08/2010) - 35% to 40%. Diffuse hypokinesis. Grade 2 diastolic dysfunction.  . Coronary artery disease     s/p 3-vessel CABG (07/2003), s/p NSTEMI (06/2009), LHC (11/2010) - 40% EF with anterior and distal inferior wall motion abnormalities, likely secondary to Adriamycin. LAD stent in proximal region 90% occluded, better from prior 100% occlusion in 2008. RCA diffusely diseased with areas of 60, 70, 80% narrowing - by Dr. Julieanne Manson.  [name] History of renal artery stenosis     Left 95% stenosis s/p left renal artery stenting (09/2009), right renal artery with 50% stenosis.  . Hypertension   . History of CVA (cerebrovascular accident)     left frontal lobe stroke (11/2009), TIA (06/2009, 04/2009) with multiple prior TIAs manifested by aphasia. H/o CVA in 1988 involving right eye.  . DM (diabetes mellitus), type 2, uncontrolled DX: 2001    Stopped of Metformin in 04/2011 in setting of AKI (Cr 3), now on insulin.  [name] History of breast cancer     Primary oncologist Dr. Idelle Leech. // First diagnosed 2000, recurrence in 06/2010. S/P left mastectomy with radiation therapy (2000),  following recurrence, started chemothearpy, completed course (06/2010.)  . CKD (chronic kidney disease) stage 2, GFR 60-89 ml/min     BL Cr 1.6  . Hypothyroidism   . Hyperlipidemia   . DJD (degenerative joint disease) of knee     left  . Hx of adenomatous colonic polyps     remote history. Last colonoscopy (03/2004) - Normal colonoscopy to the cecum, rec 5 year follow-up colonoscopy.  [name] History of DVT of lower extremity 07/2003    in setting of saphenous vein grafting for CABG    Past Surgical History: Procedure Date  . Coronary artery bypass graft 07/2003  . Total knee arthroplasty 12/2004, 12/2005    BL, right (2006) by Dr. Luiz Blare. Left (2007) by Dr. Sherlean Foot.  . Simple mastectomy 2000    left, in setting of breast cancer  . Total abdominal hysterectomy   . Lumbar disc surgery     Family History: Problem Relation Age of Onset  . Heart disease Mother   . Diabetes Mother   . Heart disease Father   . Cancer Father     colon cancer  . Heart disease Sister   . Heart disease Brother   . Cancer Brother     prostate cancer  . Heart disease Brother   . Cancer Sister     breast cancer    Social History: History   Social History  . Marital Status: Married  Spouse Name: N/A    Number of Children: 3  . Years of Education: 10 grade   Occupational History  . Retired     previously worked as a Futures trader, intermittently worked at [employer]   Social History Main Topics  . Smoking status: Never Smoker   . Smokeless tobacco: Not on file  . Alcohol Use: No  . Drug Use: No    Social History Narrative   Patient lives at home with her husband.  She has 3 children, 3 grandchildren, and 1 great-grand child. Insurance: Medicare.          Johnette Abraham, D.O.

## 2011-05-15 LAB — BASIC METABOLIC PANEL
BUN: 21 mg/dL (ref 6–23)
GFR calc Af Amer: 53 mL/min — ABNORMAL LOW (ref >60)
GFR calc non Af Amer: 44 mL/min — ABNORMAL LOW (ref >60)
Potassium: 3.9 mEq/L (ref 3.5–5.1)
Sodium: 141 mEq/L (ref 135–145)

## 2011-05-15 LAB — GLUCOSE, CAPILLARY: Glucose-Capillary: 176 mg/dL — ABNORMAL HIGH (ref 70–99)

## 2011-05-15 LAB — CBC
HCT: 35.4 % — ABNORMAL LOW (ref 36.0–46.0)
MCHC: 35 g/dL (ref 30.0–36.0)
Platelets: 205 10*3/uL (ref 150–400)
RDW: 13.9 % (ref 11.5–15.5)

## 2011-05-16 LAB — BASIC METABOLIC PANEL
GFR calc Af Amer: 53 mL/min — ABNORMAL LOW (ref >60)
GFR calc non Af Amer: 44 mL/min — ABNORMAL LOW (ref >60)
Glucose, Bld: 163 mg/dL — ABNORMAL HIGH (ref 70–99)
Potassium: 4.3 mEq/L (ref 3.5–5.1)
Sodium: 140 mEq/L (ref 135–145)

## 2011-05-17 LAB — GLUCOSE, CAPILLARY: Glucose-Capillary: 209 mg/dL — ABNORMAL HIGH (ref 70–99)

## 2011-05-25 NOTE — Consult Note (Signed)
NAMEERINNE, GILLENTINE NO.:  [id number]  MEDICAL RECORD NO.:  [id number]  LOCATION:  3031                         FACILITY:  [facility]  PHYSICIAN:  Thana Farr, MD    DATE OF BIRTH:  08-31-1935  DATE OF CONSULTATION:  05/13/2011 DATE OF DISCHARGE:                                CONSULTATION   CONSULT CALLED BY:  ED.  HISTORY:  Ms. Brabant is a 76 year old female who awakened this morning and was able to speak with her husband at approximately 5 a.m. without any deficits.  He went to make some coffee and approximately 5 minutes later returned to find his wife on the floor.  She was unable to speak clearly.  She had right-sided weakness at that time as well.  The daughter was reached and EMS was called and the patient was brought in as a code stroke.  Initial NIH stroke scale was 10.  PAST MEDICAL HISTORY: 1. Diabetes. 2. Hypertension. 3. Congestive heart failure. 4. Coronary artery disease, status post MI and CABG. 5. Renal artery stenosis. 6. Stroke. 7. Breast CA, status post mastectomy and chemotherapy. 8. Chronic kidney disease, stage II. 9. Hypothyroidism.  MEDICATIONS:  Insulin, aspirin, Lasix, levothyroxine, and metoprolol.  ALLERGIES:  No known drug allergies.  SOCIAL HISTORY:  The patient is married.  There is no history of alcohol, tobacco, or illicit drug abuse.  PHYSICAL EXAMINATION:  VITAL SIGNS:  Blood pressure 167/66, heart rate 59, respiratory rate 14, temperature 98.1, and O2 sat 100% on room air. NEUROLOGIC:  On mental status testing, the patient is alert and oriented.  Her speech is slurred.  She can follow commands.  On cranial nerve testing, II, visual fields grossly intact.  III, IV, and VI extraocular movements intact.  V and VII, decrease in the right nasolabial fold.  Smile is symmetric though.  There is decreased pinprick and light touch on the right face.  VIII, grossly intact.  IX and VII positive gag.  XI, bilateral  shoulder shrug.  XII, midline tongue extension.  On motor exam, the patient has 5-/5 with both the right upper extremity and the right lower extremity.  She is 5/5 on the left.  Sensory exam shows decreased pinprick and light touch in both the right upper and right lower extremity.  Deep tendon reflexes are 2+ in the bilateral upper extremities, absent in the lower extremities. Plantars are downgoing bilaterally.  On cerebellar testing, finger-to- nose and heel-to-shin intact.  LABORATORY DATA:  Sodium of 144, potassium 4.0, chloride 113, bicarb 22, BUN 24, creatinine 1.20, and glucose 119.  White blood cell, 4.0, platelet count 193, hemoglobin 10.7, and hematocrit 31.3.  PT 13.3, INR 0.99, and PTT 27.  CT shows old frontal infarcts.  No acute changes were noted.  Diffusion-weighted MRI of the brain shows an acute infarct in the left lateral thalamus and posterior limb of the internal capsule.  ASSESSMENT:  Ms. Kniskern is a 76 year old female who presented with difficulty with speech and right hemiparesis.  Imaging shows a left lateral thalamic infarct.  Initial NIH stroke scale was 10.  Symptoms improved during evaluation to an NIH stroke scale of 2.  Due  to significant improvement during evaluation, tPA was not given.  Further stroke workup is indicated though.  PLAN: 1. The patient will have neuro checks q.2 h. 2. Echo and carotids. 3. Plavix 75 mg daily. 4. Aspirin to be discontinued. 5. PT/OT consult. 6. Fasting lipid panel with hemoglobin A1c.          ______________________________ Thana Farr, MD     LR/MEDQ  D:  05/13/2011  T:  05/14/2011  Job:  [id number]  Electronically Signed by Thana Farr MD on 05/25/2011 12:25:19 PM

## 2011-05-28 NOTE — Discharge Summary (Signed)
[name], PIDCOCK NO.:  [id number]  MEDICAL RECORD NO.:  [id number]  LOCATION:  1416                         FACILITY:  [facility]  PHYSICIAN:  Zannie Cove, MD     DATE OF BIRTH:  [birth year]  DATE OF ADMISSION:  04/25/2011 DATE OF DISCHARGE:  04/27/2011                              DISCHARGE SUMMARY   PRIMARY CARE PHYSICIAN:  Burnell Blanks, MD and Lonie Peak with [facility].  ONCOLOGIST:  Dr. Idelle Leech.  CARDIOLOGIST:  Thereasa Solo. Little, MD  DISCHARGE DIAGNOSES: 1. Acute renal failure, resolved. 2. Hyperkalemia, resolved. 3. Chronic systolic congestive heart failure. 4. History of coronary artery disease, status post coronary artery     bypass graft. 5. History of renal artery stenosis, status post left renal artery     stenting. 6. History of cerebrovascular accident. 7. Hypertension. 8. Type 2 diabetes. 9. History of breast cancer, status post mastectomy and chemotherapy     completed in August 2011. 10.Chronic kidney disease 2. 11.Hypothyroidism.  DISCHARGE MEDICATIONS:  Are as follows; 1. Enteric-coated aspirin 325 mg daily. 2. Levemir 10 units subcu at bedtime. 3. Imdur 30 mg daily. 4. Lasix 20 mg q.a.m. p.r.n. 5. Levothyroxine 88 mcg daily. 6. Metoprolol tartrate 25 mg p.o. b.i.d.  CONSULTANTS:  Cecille Aver, MD, [facility].  HOSPITAL COURSE:  [name] is a 76 year old female with multiple medical problems with significant for CAD, diabetes, chest pain, status post mastectomy and chemo presented to the hospital with progressive weakness, poor p.o. intake, dizziness, etc.  On further evaluation, she was found to have acute on chronic renal failure with hyperkalemia. 1. Acute on chronic renal failure felt to be secondary to volume     depletion compounded by ACE inhibitors.  Her lisinopril and Lasix     were discontinued initially, she was rehydrated with IV fluids, and     her creatinine  improved at the time of discharge to 1.2 from 3.1. 2. Hyperkalemia secondary to acute renal failure as well as ACE     inhibitor.  Her ACE inhibitor has been discontinued for now and her     potassium is improved with Kayexalate as well as insulin and     dextro, etc. 3. For diabetes.  Her metformin was discontinued due to acute renal     failure.  It can possibly be restarted if her creatinine remains     stable in this range.  We will need to follow up BMET in a week. 4. History of breast cancer, status post left mastectomy and completed     chemotherapy in August 2011.  Was seen by Dr. Gaylyn Rong in the hospital     and felt like there is no sign of recurrence at this point.  He     recommended followup with the cancer center in 3 months.  DISCHARGE CONDITION:  Stable.  PHYSICAL EXAMINATION:  VITAL SIGNS:  Temp is 97.7, pulse 51, blood pressure 150/72, respirations 20, and satting 97% on room air.  DISCHARGE LABS:  White count 4.2, hemoglobin 10.6, platelets 185. Chemistry, sodium 144, potassium 4.4, chloride 111, bicarb 27, BUN 21, creatinine 1.2, and glucose 142.  DISCHARGE FOLLOWUP: 1. Mr. Harrold Donath Conroy/Dr. Maura Hamrick in 7-10 days. 2. Dr. Jethro Bolus with [facility] in 3 months.     Zannie Cove, MD     PJ/MEDQ  D:  04/27/2011  T:  04/28/2011  Job:  [id number]  cc:   Lonie Peak, PA Fax: [phone]  Burnell Blanks, MD Fax: [phone]  Exie Parody, M.D.  Electronically Signed by Zannie Cove  on 05/28/2011 12:47:52 PM

## 2011-06-01 NOTE — Discharge Summary (Signed)
[name], [name] NO.:  [id number]  MEDICAL RECORD NO.:  [id number]  LOCATION:  3031                         FACILITY:  [facility]  PHYSICIAN:  Ileana Roup, M.D.  DATE OF BIRTH:  Jan 31, 1935  DATE OF ADMISSION:  05/13/2011 DATE OF DISCHARGE:  05/16/2011                              DISCHARGE SUMMARY   PRIMARY CARE PHYSICIAN:  Burnell Blanks, [place] in [place]. The patient also sees Lonie Peak, physician assistant with Dr. Nathanial Rancher in [place].  PRIMARY NEUROLOGIST:  [facility].  DISCHARGE DIAGNOSES: 1. Acute left thalamic stroke, on aspirin.  The patient is discharged     on Plavix now. 2. Diabetes mellitus with an HbA1c of 7.6.  The patient is on insulin. 3. Hyperlipidemia. 4. Hypertension. 5. Chronic congestive heart failure with a 2-D echocardiogram in     October 2011 showing an ejection fraction of 35-40% with diffuse     hypokinesis and grade 2 diastolic dysfunction. 6. Coronary artery disease status post three-vessel coronary artery     bypass graft in September 2004, non-ST-elevation myocardial     infarction in August 2010, left heart catheterization in January     2012 showing high-grade stenosis in the proximal portion of left     anterior descending within the previously placed stent.  Good flow     down her internal mammary artery graft.  The patient is on medical     management.  Dr. Gaspar Garbe little performed a catheterization in     January 2012. 7. History of cerebrovascular accident:  She had a left frontal lobe     stroke in January 2011, transient ischemic attack in August 2010     and June 2010 with multiple prior transient ischemic attacks     manifested by aphasia.  History of cerebrovascular accident in 1988     involving right eye. 8. History of breast cancer diagnosed in 2000 status post left     mastectomy with radiation therapy and chemotherapy with Adriamycin.     Course completed in August  2011. 9. Chronic kidney disease with a baseline creatinine of about 1.2-1.6. 10.Hypothyroidism. 11.Degenerative joint disease of the left knee. 12.History of adenomatous colonic polyps.  Last colonoscopy on record     is in May 2005. 13.History of deep venous thrombosis of lower extremity.  PAST SURGICAL HISTORY: 1. Coronary artery bypass graft in 2004. 2. Total knee arthroplasty in 2006 and 2007. 3. Left-sided mastectomy in 2000. 4. Total abdominal hysterectomy. 5. Lumbar disk surgery.  DISCHARGE MEDICATIONS: 1. Zocor 40 mg 1 tablet by mouth daily. 2. Plavix 75 mg 1 tablet by mouth daily. 3. Tylenol 325 one-two tablet by mouth every 4 hours as needed for     pain. 4. Synthroid 88 mcg 1 tablet by mouth daily. 5. Lasix 20 mg 1 tablet by mouth every morning as needed for leg edema     and shortness of breath. 6. Metoprolol 25 mg 1 tablet by mouth twice a day. 7. Levemir 14-16 units subcutaneous at bedtime based on blood sugars     as instructed by primary care physician. 8. Imdur 30 mg 1 tablet by mouth daily.  DISPOSITION AND FOLLOWUP:  The patient will be followed up by her primary care physician on May 23, 2011 at 3:00 p.m.  At this time, following things needs to be checked: 1. Stroke symptoms.  The patient has resolution of her stroke symptoms     at the time of discharge.  Please follow if she has continued to do     well after her left thalamic stroke this admission.  She is being     discharged with home health physical therapy. 2. Antiplatelet.  The patient was started on Plavix and asked to stop     aspirin at the time of discharge.  She has been on Plavix in the     past, but had some intolerance.  At that time, she wants to retry     Plavix.  At this time, see if the patient is able to tolerate     Plavix.  If not, Aggrenox or aspirin twice a day might be tried for     stroke prevention in the future. 3. Diabetes.  The patient's HbA1c is 7.6 with insulin.  Continue  to     follow her diabetes as before.  ADMISSION HISTORY AND PHYSICAL:  A 76 year old woman with past medical history dictated above, presents to [facility] Emergency Department in the morning of admission with main concern of sudden onset of right- sided weakness, right upper extremity weakness greater than the lower, some slurred speech and generalized weakness.  As per the husband, the symptoms were noted approximately 5:00 a.m. in the morning of admission. The patient arrived to the [facility] ED at about 7:15 a.m.  Upon arrival, the patient reports her symptoms had improved considerably with minimal weakness on the right side and greatly improved speech.  PHYSICAL EXAMINATION:  VITAL SIGNS:  At admission temperature 98.1, blood pressure 165/67, heart rate 57, respirations 17, oxygen saturation 100% on room air. GENERAL:  No acute distress. HEART:  Regular rate and rhythm. CARDIOVASCULAR:  Clear to auscultation bilaterally. ABDOMEN:  Soft, nontender, nondistended. NECK:  Supple.  No JVD. NEUROLOGIC:  Mental status oriented x3.  Speech slightly slow, but clear.  No dysarthria or aphasia.  Cranial nerve exam shows reduced sensation on the right side, left facial droop, otherwise no abnormal findings.  Motor strength 5/5 in bilateral upper extremities and lower extremities.  Sensation intact on the right and left.  Cerebellar signs negative.  CONSULTATIONS:  Neurology provided by Dr. Thana Farr who suggested starting Plavix instead of aspirin for secondary prevention.  The patient will be discharged on statin.  PROCEDURES PERFORMED: 1. Noncontrast CT scan of the head, which showed remote left frontal     infarct and chronic microvascular ischemic changes. 2. MRI of the brain which showed acute infarction of the left lateral     thalamus and internal capsule.  An atrophy and chronic ischemia in     the frontal lobes and white matter bilaterally. 3. The patient had a  followup CT scan on day 2 of admission for     worsening symptoms, which showed evolving left thalamic infarct.     No other acute findings.  HOSPITAL COURSE: 1. Acute left thalamic stroke.  The patient was started on stroke core     measures for management of acute stroke.  She was not a candidate     for t-PA at presentation given resolution of her symptoms.  Further     stroke workup in the hospital included a 2-D  echocardiogram of the     heart which showed normal systolic function with ejection fraction     of 55-60% and grade 2 diastolic dysfunction.  No significant     valvular abnormalities.  No LV thrombus.  She also had carotid     Dopplers which did not show any significant ICA stenosis.  The     patient was switched from aspirin to Plavix at presentation given     history of being on aspirin and having a stroke.  The patient was     also started on statin at admission.  Her lipid profile showed an     LDL of 183 and the patient was not on any statin at the time of     presentation.  She started receiving physical therapy, occupational     therapy and speech therapy on the day of admission and she     continued to do extremely well throughout the hospital course.  The     physical therapist recommended home health physical therapy for the     patient and this has been arranged prior to discharge.  The     patient's blood pressure and diabetes were fairly well controlled     in the hospital on her current home medications and this will be     continued as outpatient.  Of note, the patient has a history of intolerance to Plavix in the past and this might be an issue in the future.  The patient wanted to try Plavix again for few weeks for further stroke prevention, and so she has been given a prescription of Plavix.  If the patient is not able to tolerate Plavix in the future as per the neurologist's recommendation during this hospitalization, either Aggrenox or aspirin 325 mg  twice a day can be done.  The patient has financial difficulties and Aggrenox might be an expensive option.  We will let the primary care physician to decide about the best antiplatelet agent for her in the future in case she does not tolerate Plavix.  The patient has been given Plavix for 3 days in the hospital and she did not have any problems tolerating it. 1. Hypertension.  Blood pressure was maintained on metoprolol at home     dose. 2. Hyperlipidemia.  The patient had a repeat profile in the hospital,     which showed a total cholesterol of 249, triglyceride of 126, HDL     of 41, LDL of 183.  She was started on Zocor at 40 mg daily. 3. Hypothyroidism.  TSH checked in the hospital was 0.659.  The     patient was continued on her home dose of Synthroid. 4. Diabetes.  The patient's HbA1c in the hospital was 7.6.  She is     being continued on her home dose of Levemir at this time with     outpatient followup. 5. History of coronary artery disease.  We checked serial EKGs and     cardiac enzymes in the hospital and they were negative for any     evidence of acute coronary syndrome.  DISCHARGE VITALS:  Temperature 98.0, pulse 61, respirations 16, blood pressure 132/67, oxygen saturation 100% on room air.  DISCHARGE LABS:  Sodium 140, potassium 4.3, chloride 105, bicarb 25, BUN 27, creatinine 1.21, glucose 163, calcium 9.3.  White count 5.6, hemoglobin 12.4, platelet count 205.     Bethel Born, MD   ______________________________ Ileana Roup, M.D.  MD/MEDQ  D:  05/16/2011  T:  05/17/2011  Job:  [id number]  cc:   Pramod P. Pearlean Brownie, MD Burnell Blanks, MD  Electronically Signed by Bethel Born  on 05/24/2011 09:32:17 AM Electronically Signed by Margarito Liner M.D. on 06/01/2011 07:54:44 PM

## 2011-06-06 NOTE — H&P (Signed)
NAMEJETAUN, [name] NO.:  [id number]  MEDICAL RECORD NO.:  [id number]  LOCATION:  WLED                         FACILITY:  [facility]  PHYSICIAN:  Altha Harm, MDDATE OF BIRTH:  1935-06-26  DATE OF ADMISSION:  04/25/2011 DATE OF DISCHARGE:                             HISTORY & PHYSICAL   PRIMARY CARE PHYSICIAN:  [facility].  ONCOLOGIST:  Jethro Bolus, MD  CARDIOLOGIST:  Julieanne Manson, MD  BREAST SURGEON:  Emelia Loron, MD  CHIEF COMPLAINT:  Progressive weakness for 5 to 6 months which has escalated in the last week.  HISTORY OF PRESENT ILLNESS:  Ms. Loge is a very pleasant 76 year old lady who has an extensive medical history including breast cancer, status post mastectomy; chronic congestive heart failure with an EF of 37%; coronary artery disease, status post stenting; renal artery stenosis, status post left stenting, who presents to the emergency room with complaints of weakness.  The patient states that she had had three different types of chemotherapy for her breast cancer but that chemo had to be discontinued secondary to the effect on the heart.  She states that she started feeling better after that but then in the last 5 to 6 months has been feeling weak and has been getting progressively weaker. The patient states that in the last maybe week, she just has been feeling profoundly weak like a "wet rag" unable to get up.  She had one episode of emesis and has had a very poor appetite.  The patient has not any pruritus.  She not had any syncope but she has had some dizziness associated intermittently with standing.  She states that her urine output has been diminished over the past week also.  The patient has had no fever or chills.  She has had no diarrhea, as a matter of fact the patient is constipated.  She has had no abdominal pain.  She has had no cough.  She has had no shortness of breath.  PAST MEDICAL HISTORY:   Significant for: 1. Breast cancer, status post left mastectomy. 2. Chronic systolic heart failure with an ejection fraction of 37% on     last echo. 3. Diabetes type 2. 4. Renal artery stenosis, status post stenting of left renal artery. 5. Coronary artery disease. 6. Status post MI. 7. Status post CABG. 8. Status post PTCA. 9. Hypertension. 10.Status post CVA.  FAMILY HISTORY:  Significant for breast cancer in her sister, prostate cancer in her brother, coronary artery disease in her mother and siblings.  SOCIAL HISTORY:  The patient lives with her husband.  There is no tobacco, alcohol or drug use.  CURRENT MEDICATIONS:  Currently unavailable.  Will review them after pharmacy has obtained the medications.  ALLERGIES:  No known drug allergies.  REVIEW OF SYSTEMS:  All other systems negative.  LABORATORY AND RADIOLOGIC DATA:  Studies in the emergency room:  Chest x- ray is negative for any acute cardiopulmonary findings.  Hemogram shows a white blood cell count of 6.7, hemoglobin of 13.1, hematocrit of 39.8, platelet count of 254.  Sodium 135, potassium 6.8, chloride 103, bicarb 15, BUN 58, creatinine is 3.01.  Labs shows that  the patient had a baseline BUN of 33 in December 2011 and a baseline creatinine of 1.63 in December 2011.  Lipase is 133.  Review of echo shows an EF of 35% to 40% and a grade 2 diastolic dysfunction.  PHYSICAL EXAMINATION:  GENERAL:  The patient is a frail-appearing lady lying in no acute distress. VITAL SIGNS:  Temperature is 97.7, heart rate 55, blood pressure 107/37, respiratory rate 13, O2 saturation 97% on room air.  HEENT: Normocephalic, atraumatic.  Pupils are equally round and reactive to light.  Oropharynx is tacky.  No exudates, erythema or lesions are noted. NECK:  Trachea is midline.  No masses, no thyromegaly, no JVD, no carotid bruit. CARDIOVASCULAR:  Normal S1 and S2.  No murmurs, rubs or gallops are noted.  PMI is nondisplaced.  No  heaves or thrills on palpation.  On telemetry, the patient has bradycardia with what appears to be a right bundle branch block.  12-lead EKG shows right bundle branch block and what appears to be maybe the beginning of some sine-wave. ABDOMEN:  Obese, soft, nontender, nondistended.  No masses, no hepatosplenomegaly. LYMPH NODES SURVEY:  No cervical, axillary or inguinal lymphadenopathy. MUSCULOSKELETAL:  No warmth, swelling or erythema around the joints. NEUROLOGIC:  No focal neurological deficits.  Cranial nerves II-XII are grossly intact. PSYCHIATRIC:  Alert, oriented x3, good insight and cognition, good recent and remote recall.  ASSESSMENT AND PLAN:  This is a patient who presents with: Hyperkalemia:  The patient's hyperkalemia is likely multifactorial.  I have just reviewed her medications.  The patient is on metformin as well as an ACE inhibitor as well as Lasix p.r.n.  All these could contribute to her condition and certainly she could have a worsening of renal function as a result of her prerenal state.  It is unclear as to whether or not the patient's chemotherapy may have contributed to her condition or whether or not she may be having worsening of renal artery stenosis in the setting of an ACE inhibitor.  Nevertheless, the patient has hyperkalemia in the setting of acute renal failure and what appears to be beginning of some sine-wave.  I have asked the nurse practitioner in the emergency room who saw the patient initially to please give her some calcium gluconate to stabilize the membranes in addition to all the temporizing measures she has already done.  The patient has received Kayexalate and we will review the potassium after the patient has had a bowel movement.  The patient will be gently hydrated and caution needs to be taken with this patient who does have chronic systolic heart failure that she does not go into full-blown pulmonary edema in the setting of acute  renal failure.  In terms of her diabetes, all long-acting medications to be held at this time and the patient will be managed with a sliding scale.  And we will monitor the patient's blood pressure and treat with a beta-blocker if necessary.  Dr. Kathrene Bongo from  nephrology has been consulted to see the patient and on her evaluation will make decisions as to whether or not the patient should remain here at [facility] or be transferred to [facility].     Altha Harm, MD     MAM/MEDQ  D:  04/25/2011  T:  04/25/2011  Job:  [id number]  cc:   Exie Parody, M.D.  Thereasa Solo. Little, M.D. Fax: [phone]  Juanetta Gosling, MD [address] [place] [address]  [facility]  Electronically Signed by Marthann Schiller MD on 06/06/2011 06:00:20 PM

## 2011-08-15 LAB — BASIC METABOLIC PANEL
BUN: 24 — ABNORMAL HIGH
Calcium: 8.7
Creatinine, Ser: 1.29 — ABNORMAL HIGH
GFR calc non Af Amer: 41 — ABNORMAL LOW
Potassium: 4.2

## 2011-08-15 LAB — CBC
MCHC: 35.4
Platelets: 224
RBC: 4.04
RDW: 12.7

## 2011-08-15 LAB — HEMOGLOBIN A1C: Hgb A1c MFr Bld: 7.4 — ABNORMAL HIGH

## 2011-08-15 LAB — LIPID PANEL
HDL: 28 — ABNORMAL LOW
LDL Cholesterol: 114 — ABNORMAL HIGH
Total CHOL/HDL Ratio: 6.3
VLDL: 35

## 2011-08-15 LAB — CARDIAC PANEL(CRET KIN+CKTOT+MB+TROPI)
CK, MB: 2.2
Relative Index: INVALID
Total CK: 71
Total CK: 71

## 2011-08-15 LAB — URINALYSIS, ROUTINE W REFLEX MICROSCOPIC
Ketones, ur: NEGATIVE
Nitrite: NEGATIVE
Protein, ur: NEGATIVE
Urobilinogen, UA: 0.2
pH: 5

## 2011-08-15 LAB — B-NATRIURETIC PEPTIDE (CONVERTED LAB): Pro B Natriuretic peptide (BNP): <30

## 2011-08-15 LAB — COMPREHENSIVE METABOLIC PANEL
ALT: 15
Albumin: 3.2 — ABNORMAL LOW
Alkaline Phosphatase: 75
Calcium: 9
Potassium: 3.9
Sodium: 139
Total Protein: 6

## 2011-08-15 LAB — TROPONIN I: Troponin I: <0.01

## 2011-08-15 LAB — DIFFERENTIAL
Basophils Absolute: 0.1
Basophils Relative: 1
Eosinophils Relative: 2
Monocytes Absolute: 0.5

## 2011-08-15 LAB — CK TOTAL AND CKMB (NOT AT ARMC)
Relative Index: INVALID
Total CK: 82

## 2011-08-15 LAB — TSH: TSH: 4.057

## 2011-08-15 LAB — D-DIMER, QUANTITATIVE: D-Dimer, Quant: 0.73 — ABNORMAL HIGH

## 2011-10-04 ENCOUNTER — Other Ambulatory Visit: Payer: Self-pay | Admitting: Oncology

## 2011-10-04 DIAGNOSIS — Z853 Personal history of malignant neoplasm of breast: Secondary | ICD-10-CM

## 2011-10-23 HISTORY — PX: OTHER SURGICAL HISTORY: SHX169

## 2011-12-05 ENCOUNTER — Other Ambulatory Visit: Payer: Medicare Other

## 2011-12-05 ENCOUNTER — Ambulatory Visit: Payer: Medicare Other | Admitting: Oncology

## 2012-03-12 ENCOUNTER — Emergency Department (HOSPITAL_COMMUNITY): Payer: Medicare Other

## 2012-03-12 ENCOUNTER — Inpatient Hospital Stay (HOSPITAL_COMMUNITY)
Admission: EM | Admit: 2012-03-12 | Discharge: 2012-03-14 | DRG: 309 | Disposition: A | Discharge status: Home / Self Care | Payer: Medicare Other | Attending: Internal Medicine | Admitting: Internal Medicine

## 2012-03-12 ENCOUNTER — Encounter (HOSPITAL_COMMUNITY): Payer: Self-pay | Admitting: Emergency Medicine

## 2012-03-12 DIAGNOSIS — R112 Nausea with vomiting, unspecified: Secondary | ICD-10-CM | POA: Diagnosis present

## 2012-03-12 DIAGNOSIS — E860 Dehydration: Secondary | ICD-10-CM | POA: Diagnosis present

## 2012-03-12 DIAGNOSIS — I25810 Atherosclerosis of coronary artery bypass graft(s) without angina pectoris: Secondary | ICD-10-CM

## 2012-03-12 DIAGNOSIS — E039 Hypothyroidism, unspecified: Secondary | ICD-10-CM | POA: Diagnosis present

## 2012-03-12 DIAGNOSIS — I5022 Chronic systolic (congestive) heart failure: Secondary | ICD-10-CM

## 2012-03-12 DIAGNOSIS — Z8673 Personal history of transient ischemic attack (TIA), and cerebral infarction without residual deficits: Secondary | ICD-10-CM

## 2012-03-12 DIAGNOSIS — Z853 Personal history of malignant neoplasm of breast: Secondary | ICD-10-CM

## 2012-03-12 DIAGNOSIS — Z794 Long term (current) use of insulin: Secondary | ICD-10-CM

## 2012-03-12 DIAGNOSIS — Z96659 Presence of unspecified artificial knee joint: Secondary | ICD-10-CM

## 2012-03-12 DIAGNOSIS — I10 Essential (primary) hypertension: Secondary | ICD-10-CM | POA: Insufficient documentation

## 2012-03-12 DIAGNOSIS — Z86010 Personal history of colonic polyps: Secondary | ICD-10-CM | POA: Insufficient documentation

## 2012-03-12 DIAGNOSIS — I129 Hypertensive chronic kidney disease with stage 1 through stage 4 chronic kidney disease, or unspecified chronic kidney disease: Secondary | ICD-10-CM | POA: Diagnosis present

## 2012-03-12 DIAGNOSIS — N058 Unspecified nephritic syndrome with other morphologic changes: Secondary | ICD-10-CM | POA: Diagnosis present

## 2012-03-12 DIAGNOSIS — I2510 Atherosclerotic heart disease of native coronary artery without angina pectoris: Secondary | ICD-10-CM | POA: Diagnosis present

## 2012-03-12 DIAGNOSIS — I639 Cerebral infarction, unspecified: Secondary | ICD-10-CM | POA: Insufficient documentation

## 2012-03-12 DIAGNOSIS — Z79899 Other long term (current) drug therapy: Secondary | ICD-10-CM

## 2012-03-12 DIAGNOSIS — M1710 Unilateral primary osteoarthritis, unspecified knee: Secondary | ICD-10-CM | POA: Diagnosis present

## 2012-03-12 DIAGNOSIS — E1129 Type 2 diabetes mellitus with other diabetic kidney complication: Secondary | ICD-10-CM | POA: Diagnosis present

## 2012-03-12 DIAGNOSIS — M179 Osteoarthritis of knee, unspecified: Secondary | ICD-10-CM | POA: Insufficient documentation

## 2012-03-12 DIAGNOSIS — I25710 Atherosclerosis of autologous vein coronary artery bypass graft(s) with unstable angina pectoris: Secondary | ICD-10-CM | POA: Diagnosis present

## 2012-03-12 DIAGNOSIS — E118 Type 2 diabetes mellitus with unspecified complications: Secondary | ICD-10-CM | POA: Diagnosis present

## 2012-03-12 DIAGNOSIS — E1165 Type 2 diabetes mellitus with hyperglycemia: Secondary | ICD-10-CM | POA: Diagnosis present

## 2012-03-12 DIAGNOSIS — IMO0002 Reserved for concepts with insufficient information to code with codable children: Secondary | ICD-10-CM | POA: Diagnosis present

## 2012-03-12 DIAGNOSIS — I498 Other specified cardiac arrhythmias: Principal | ICD-10-CM | POA: Diagnosis present

## 2012-03-12 DIAGNOSIS — E875 Hyperkalemia: Secondary | ICD-10-CM | POA: Diagnosis present

## 2012-03-12 DIAGNOSIS — Z86718 Personal history of other venous thrombosis and embolism: Secondary | ICD-10-CM

## 2012-03-12 DIAGNOSIS — T380X5A Adverse effect of glucocorticoids and synthetic analogues, initial encounter: Secondary | ICD-10-CM | POA: Diagnosis present

## 2012-03-12 DIAGNOSIS — E785 Hyperlipidemia, unspecified: Secondary | ICD-10-CM | POA: Insufficient documentation

## 2012-03-12 DIAGNOSIS — M109 Gout, unspecified: Secondary | ICD-10-CM | POA: Diagnosis present

## 2012-03-12 DIAGNOSIS — D72829 Elevated white blood cell count, unspecified: Secondary | ICD-10-CM | POA: Diagnosis present

## 2012-03-12 DIAGNOSIS — R55 Syncope and collapse: Secondary | ICD-10-CM | POA: Diagnosis present

## 2012-03-12 DIAGNOSIS — I509 Heart failure, unspecified: Secondary | ICD-10-CM | POA: Diagnosis present

## 2012-03-12 DIAGNOSIS — R001 Bradycardia, unspecified: Secondary | ICD-10-CM

## 2012-03-12 DIAGNOSIS — Z7982 Long term (current) use of aspirin: Secondary | ICD-10-CM

## 2012-03-12 DIAGNOSIS — N182 Chronic kidney disease, stage 2 (mild): Secondary | ICD-10-CM | POA: Diagnosis present

## 2012-03-12 DIAGNOSIS — Z860101 Personal history of adenomatous and serrated colon polyps: Secondary | ICD-10-CM | POA: Insufficient documentation

## 2012-03-12 DIAGNOSIS — I701 Atherosclerosis of renal artery: Secondary | ICD-10-CM | POA: Diagnosis present

## 2012-03-12 DIAGNOSIS — Z951 Presence of aortocoronary bypass graft: Secondary | ICD-10-CM

## 2012-03-12 DIAGNOSIS — I5042 Chronic combined systolic (congestive) and diastolic (congestive) heart failure: Secondary | ICD-10-CM | POA: Diagnosis present

## 2012-03-12 HISTORY — DX: Essential (primary) hypertension: I10

## 2012-03-12 LAB — URINALYSIS, ROUTINE W REFLEX MICROSCOPIC
Leukocytes, UA: NEGATIVE
Nitrite: NEGATIVE
Protein, ur: NEGATIVE mg/dL
Specific Gravity, Urine: 1.028 (ref 1.005–1.030)
Urobilinogen, UA: 0.2 mg/dL (ref 0.0–1.0)

## 2012-03-12 LAB — CBC
Hemoglobin: 16 g/dL — ABNORMAL HIGH (ref 12.0–15.0)
MCH: 32.7 pg (ref 26.0–34.0)
MCHC: 34.9 g/dL (ref 30.0–36.0)
MCV: 93.9 fL (ref 78.0–100.0)

## 2012-03-12 LAB — COMPREHENSIVE METABOLIC PANEL
Albumin: 3.9 g/dL (ref 3.5–5.2)
Alkaline Phosphatase: 99 U/L (ref 39–117)
BUN: 43 mg/dL — ABNORMAL HIGH (ref 6–23)
Calcium: 10 mg/dL (ref 8.4–10.5)
Creatinine, Ser: 1.67 mg/dL — ABNORMAL HIGH (ref 0.50–1.10)
GFR calc Af Amer: 34 mL/min — ABNORMAL LOW (ref >90)
Glucose, Bld: 305 mg/dL — ABNORMAL HIGH (ref 70–99)
Potassium: 5.2 mEq/L — ABNORMAL HIGH (ref 3.5–5.1)
Total Protein: 8.3 g/dL (ref 6.0–8.3)

## 2012-03-12 LAB — GLUCOSE, CAPILLARY: Glucose-Capillary: 318 mg/dL — ABNORMAL HIGH (ref 70–99)

## 2012-03-12 LAB — DIFFERENTIAL
Basophils Relative: 0 % (ref 0–1)
Eosinophils Absolute: 0 10*3/uL (ref 0.0–0.7)
Eosinophils Relative: 0 % (ref 0–5)
Lymphs Abs: 2.1 10*3/uL (ref 0.7–4.0)
Monocytes Relative: 7 % (ref 3–12)
Neutrophils Relative %: 74 % (ref 43–77)

## 2012-03-12 LAB — CARDIAC PANEL(CRET KIN+CKTOT+MB+TROPI)
CK, MB: 2.8 ng/mL (ref 0.3–4.0)
Relative Index: INVALID (ref 0.0–2.5)
Total CK: 40 U/L (ref 7–177)
Troponin I: <0.3 ng/mL (ref <0.30)

## 2012-03-12 LAB — ABO/RH: ABO/RH(D): A POS

## 2012-03-12 LAB — TYPE AND SCREEN
ABO/RH(D): A POS
Antibody Screen: NEGATIVE

## 2012-03-12 LAB — POCT I-STAT TROPONIN I

## 2012-03-12 LAB — URINE MICROSCOPIC-ADD ON

## 2012-03-12 LAB — PROTIME-INR
INR: 0.92 (ref 0.00–1.49)
Prothrombin Time: 12.6 seconds (ref 11.6–15.2)

## 2012-03-12 MED ORDER — NITROGLYCERIN 0.4 MG SL SUBL
SUBLINGUAL_TABLET | SUBLINGUAL | Status: AC
  Administered 2012-03-12: 21:00:00
  Filled 2012-03-12: qty 25

## 2012-03-12 MED ORDER — SODIUM CHLORIDE 0.9 % IV SOLN
INTRAVENOUS | Status: DC
  Administered 2012-03-12: 18:00:00 via INTRAVENOUS

## 2012-03-12 MED ORDER — ONDANSETRON HCL 4 MG/2ML IJ SOLN: 4.0000 mg | Freq: Four times a day (QID) | INTRAMUSCULAR | Status: DC | PRN

## 2012-03-12 MED ORDER — NITROGLYCERIN 0.4 MG SL SUBL: 0.4000 mg | SUBLINGUAL_TABLET | SUBLINGUAL | Status: DC | PRN

## 2012-03-12 MED ORDER — INSULIN ASPART 100 UNIT/ML ~~LOC~~ SOLN
0.0000 [IU] | Freq: Three times a day (TID) | SUBCUTANEOUS | Status: DC
  Administered 2012-03-13: 5 [IU] via SUBCUTANEOUS
  Administered 2012-03-13 – 2012-03-14 (×3): 3 [IU] via SUBCUTANEOUS

## 2012-03-12 MED ORDER — ASPIRIN EC 325 MG PO TBEC
325.0000 mg | DELAYED_RELEASE_TABLET | Freq: Two times a day (BID) | ORAL | Status: DC
  Administered 2012-03-12 – 2012-03-14 (×4): 325 mg via ORAL
  Filled 2012-03-12 (×5): qty 1

## 2012-03-12 MED ORDER — INSULIN ASPART 100 UNIT/ML ~~LOC~~ SOLN
0.0000 [IU] | Freq: Once | SUBCUTANEOUS | Status: AC
  Administered 2012-03-12: 7 [IU] via SUBCUTANEOUS

## 2012-03-12 MED ORDER — ACETAMINOPHEN 650 MG RE SUPP: 650.0000 mg | Freq: Four times a day (QID) | RECTAL | Status: DC | PRN

## 2012-03-12 MED ORDER — ACETAMINOPHEN 325 MG PO TABS: 650.0000 mg | ORAL_TABLET | Freq: Four times a day (QID) | ORAL | Status: DC | PRN

## 2012-03-12 MED ORDER — ENOXAPARIN SODIUM 40 MG/0.4ML ~~LOC~~ SOLN
40.0000 mg | SUBCUTANEOUS | Status: DC
  Administered 2012-03-12 – 2012-03-13 (×2): 40 mg via SUBCUTANEOUS
  Filled 2012-03-12 (×3): qty 0.4

## 2012-03-12 MED ORDER — LEVOTHYROXINE SODIUM 88 MCG PO TABS
88.0000 ug | ORAL_TABLET | Freq: Every day | ORAL | Status: DC
  Administered 2012-03-12 – 2012-03-14 (×3): 88 ug via ORAL
  Filled 2012-03-12 (×3): qty 1

## 2012-03-12 MED ORDER — ISOSORBIDE MONONITRATE ER 30 MG PO TB24
30.0000 mg | ORAL_TABLET | Freq: Every day | ORAL | Status: DC
  Administered 2012-03-12 – 2012-03-14 (×3): 30 mg via ORAL
  Filled 2012-03-12 (×3): qty 1

## 2012-03-12 MED ORDER — PAROXETINE HCL 10 MG PO TABS
5.0000 mg | ORAL_TABLET | Freq: Every day | ORAL | Status: DC
  Administered 2012-03-12 – 2012-03-14 (×3): 5 mg via ORAL
  Filled 2012-03-12 (×3): qty 0.5

## 2012-03-12 MED ORDER — LORAZEPAM 0.5 MG PO TABS: 0.5000 mg | ORAL_TABLET | Freq: Every evening | ORAL | Status: DC | PRN

## 2012-03-12 MED ORDER — COLCHICINE 0.6 MG PO TABS
0.6000 mg | ORAL_TABLET | Freq: Every day | ORAL | Status: DC
  Administered 2012-03-13: 0.6 mg via ORAL
  Filled 2012-03-12: qty 1

## 2012-03-12 MED ORDER — INSULIN ASPART 100 UNIT/ML ~~LOC~~ SOLN
0.0000 [IU] | Freq: Every day | SUBCUTANEOUS | Status: DC
  Administered 2012-03-12: 3 [IU] via SUBCUTANEOUS

## 2012-03-12 MED ORDER — ONDANSETRON HCL 4 MG PO TABS: 4.0000 mg | ORAL_TABLET | Freq: Four times a day (QID) | ORAL | Status: DC | PRN

## 2012-03-12 MED ORDER — INSULIN GLARGINE 100 UNIT/ML ~~LOC~~ SOLN: 20.0000 [IU] | Freq: Every day | SUBCUTANEOUS | Status: DC

## 2012-03-12 MED ORDER — INSULIN GLARGINE 100 UNIT/ML ~~LOC~~ SOLN
30.0000 [IU] | Freq: Every day | SUBCUTANEOUS | Status: DC
  Administered 2012-03-12 – 2012-03-13 (×2): 30 [IU] via SUBCUTANEOUS

## 2012-03-12 NOTE — ED Notes (Signed)
Per Dr. Lynelle Doctor, pt may eat and drink.

## 2012-03-12 NOTE — ED Provider Notes (Signed)
History     CSN: [id number]  Arrival date & time 03/12/12  7829   First MD Initiated Contact with Patient 03/12/12 1023      CC: Syncope  HPI Pt was getting up this am.  She had gotten out of the shower and was walking out of the bathroom. She felt weak as if she would pass out.  Pt leaned over and the next thing she noticed was that EMS was there.  Husband noticed that her eyes had rolled back in her head. No seizure activity.  Starting Friday she has been feeling weak and fatigued.  Decreased energy. She had been complaining of indegestion over the weekend.  No fevers, no cough.  Pt had been put on prednisone for gout on Wednesday. The patient denies any chest pain, abdominal pain, numbness at this time. She does feel generalized weakness. No fevers or coughing. Her symptoms are worse whenever she tries to exert herself. She denies any previous history of syncope. Past Medical History  Diagnosis Date  . Chronic congestive heart failure     Mixed. 2D-echocardiogram (08/2010) - 35% to 40%. Diffuse hypokinesis. Grade 2 diastolic dysfunction.  . Coronary artery disease     s/p 3-vessel CABG (07/2003), s/p NSTEMI (06/2009), LHC (11/2010) - 40% EF with anterior and distal inferior wall motion abnormalities, likely secondary to Adriamycin. LAD stent in proximal region 90% occluded, better from prior 100% occlusion in 2008. RCA diffusely diseased with areas of 60, 70, 80% narrowing - by Dr. Julieanne Manson.  [name] History of renal artery stenosis     Left 95% stenosis s/p left renal artery stenting (09/2009), right renal artery with 50% stenosis.  . Hypertension   . History of CVA (cerebrovascular accident)     left frontal lobe stroke (11/2009), TIA (06/2009, 04/2009) with multiple prior TIAs manifested by aphasia. H/o CVA in 1988 involving right eye.  . DM (diabetes mellitus), type 2, uncontrolled DX: 2001    Stopped of Metformin in 04/2011 in setting of AKI (Cr 3), now on insulin.  [name] History of  breast cancer     Primary oncologist Dr. Idelle Leech. // First diagnosed 2000, recurrence in 06/2010. S/P left mastectomy with radiation therapy (2000), following recurrence, started chemothearpy, completed course (06/2010.)  . CKD (chronic kidney disease) stage 2, GFR 60-89 ml/min     BL Cr 1.6  . Hypothyroidism   . Hyperlipidemia   . DJD (degenerative joint disease) of knee     left  . Hx of adenomatous colonic polyps     remote history. Last colonoscopy (03/2004) - Normal colonoscopy to the cecum, rec 5 year follow-up colonoscopy.  [name] History of DVT of lower extremity 07/2003    in setting of saphenous vein grafting for CABG    Past Surgical History  Procedure Date  . Coronary artery bypass graft 07/2003  . Total knee arthroplasty 12/2004, 12/2005    BL, right (2006) by Dr. Luiz Blare. Left (2007) by Dr. Sherlean Foot.  . Simple mastectomy 2000    left, in setting of breast cancer  . Total abdominal hysterectomy   . Lumbar disc surgery     Family History  Problem Relation Age of Onset  . Heart disease Mother   . Diabetes Mother   . Heart disease Father   . Cancer Father     colon cancer  . Heart disease Sister   . Heart disease Brother   . Cancer Brother     prostate cancer  . Heart disease  Brother   . Cancer Sister     breast cancer    History  Substance Use Topics  . Smoking status: Never Smoker   . Smokeless tobacco: Not on file  . Alcohol Use: No    OB History    Grav Para Term Preterm Abortions TAB SAB Ect Mult Living                  Review of Systems  All other systems reviewed and are negative.    Allergies  Review of patient's allergies indicates no known allergies.  Home Medications   Current Outpatient Rx  Name Route Sig Dispense Refill  . ASPIRIN 325 MG PO TBEC Oral Take 325 mg by mouth daily.      . FUROSEMIDE 20 MG PO TABS Oral Take 20 mg by mouth daily as needed.      . INSULIN DETEMIR 100 UNIT/ML [place] SOLN Subcutaneous Inject into the skin at  bedtime. Inject 14-16 units subcutaneously qHS.     [name] LEVOTHYROXINE SODIUM 88 MCG PO TABS Oral Take 88 mcg by mouth daily.      [name] METOPROLOL TARTRATE 25 MG PO TABS Oral Take 25 mg by mouth 2 (two) times daily.        BP 112/62  Pulse 49  Temp(Src) 97.4 F (36.3 C) (Oral)  Resp 16  Ht 5\' 3"  (1.6 m)  Wt 146 lb (66.225 kg)  BMI 25.86 kg/m2  SpO2 98%  Physical Exam  Nursing note and vitals reviewed. Constitutional: She is oriented to person, place, and time. She appears well-developed and well-nourished. No distress.  HENT:  Head: Normocephalic and atraumatic.  Right Ear: External ear normal.  Left Ear: External ear normal.  Mouth/Throat: Oropharynx is clear and moist.  Eyes: Conjunctivae are normal. Right eye exhibits no discharge. Left eye exhibits no discharge. No scleral icterus.  Neck: Neck supple. No tracheal deviation present.  Cardiovascular: Regular rhythm and intact distal pulses.  Bradycardia present.   Pulmonary/Chest: Effort normal and breath sounds normal. No stridor. No respiratory distress. She has no wheezes. She has no rales.  Abdominal: Soft. Bowel sounds are normal. She exhibits no distension. There is no tenderness. There is no rebound and no guarding.  Musculoskeletal: She exhibits no edema and no tenderness.       Cervical back: Normal.  Neurological: She is alert and oriented to person, place, and time. She has normal strength. No cranial nerve deficit ( no gross defecits noted) or sensory deficit. She exhibits normal muscle tone. She displays no seizure activity. Coordination normal.       No pronator drift bilateral upper extrem, able to hold both legs off bed for 5 seconds, sensation intact in all extremities, no visual field cuts, no left or right sided neglect  Skin: Skin is warm and dry. No rash noted.  Psychiatric: She has a normal mood and affect.    ED Course  Procedures (including critical care time)  Rate: 50  Rhythm: normal sinus rhythm  QRS  Axis: normal  Intervals: normal  ST/T Wave abnormalities: ST depression and T wave changes consistent with repolarization abnormality  Conduction Disutrbances:none  Narrative Interpretation: Left ventricular hypertrophy with intraventricular conduction delay and repolarization abnormality  Old EKG Reviewed: ST and T wave changes more prominent since prior EKG Labs Reviewed  CBC - Abnormal; Notable for the following:    WBC 10.8 (*)    Hemoglobin 16.0 (*)    All other components within normal limits  DIFFERENTIAL - Abnormal; Notable for the following:    Neutro Abs 8.0 (*)    All other components within normal limits  COMPREHENSIVE METABOLIC PANEL - Abnormal; Notable for the following:    Sodium 132 (*)    Potassium 5.2 (*) MODERATE HEMOLYSIS   Glucose, Bld 305 (*)    BUN 43 (*)    Creatinine, Ser 1.67 (*)    GFR calc non Af Amer 29 (*)    GFR calc Af Amer 34 (*)    All other components within normal limits  URINALYSIS, ROUTINE W REFLEX MICROSCOPIC - Abnormal; Notable for the following:    Glucose, UA >1000 (*)    All other components within normal limits  URINE MICROSCOPIC-ADD ON - Abnormal; Notable for the following:    Bacteria, UA FEW (*)    All other components within normal limits  PROTIME-INR  TYPE AND SCREEN  POCT I-STAT TROPONIN I  ABO/RH  POCT CBG (FASTING - GLUCOSE)-MANUAL ENTRY   Dg Chest 2 View  03/12/2012  *RADIOLOGY REPORT*  Clinical Data: Weakness and CHF.  CHEST - 2 VIEW  Comparison: 04/25/2011  Findings: The lungs are clear without focal infiltrate, edema, pneumothorax or pleural effusion. The cardiopericardial silhouette is within normal limits for size.  The patient is status post CABG. Imaged bony structures of the thorax are intact.  IMPRESSION: Stable.  No acute findings.  Original Report Authenticated By: ERIC A. MANSELL, M.D.   Ct Head Wo Contrast  03/12/2012  *RADIOLOGY REPORT*  Clinical Data: Generalized weakness.  Nausea.  CT HEAD WITHOUT CONTRAST   Technique:  Contiguous axial images were obtained from the base of the skull through the vertex without contrast.  Comparison: 05/14/2011  Findings: There is no acute hemorrhage, infarction, or mass lesion. There are old small bilateral frontal infarcts which are unchanged with focal slight encephalomalacia.  Brain parenchyma is otherwise normal.  Osseous structures are normal except for chronic partial opacification of the left side of the sphenoid sinus.  IMPRESSION: No acute intracranial abnormality.  Old small infarcts in both frontal lobes.  Original Report Authenticated By: Gwynn Burly, M.D.      MDM  Patient presents with an episode of near syncope/syncope. Her workup in the emergency room reveals a sinus bradycardia associated with hyperglycemia, renal insufficiency and probable dehydration. At this time there does not appear to be evidence of an acute infection or stroke. Patient has been given IV fluids. I will make her to the hospital for further evaluation. At this time I do not feel is any indication for acute emergency external pacing. Her blood pressure has remained normal.  Her syncope may be related solely to the dehydration and hyperglycemia but it is also possible that the bradycardia could be related. She is on metoprolol and would recommend that we hold this medication while she is hospitalized        Celene Kras, MD 03/12/12 1311

## 2012-03-12 NOTE — ED Notes (Signed)
Patient transported to X-ray 

## 2012-03-12 NOTE — H&P (Signed)
Patient seen and examined. Chart reviewed. Agree with Cordelia Pen, NP.  77 year old Caucasian female with history of coronary disease status post CABG, type 2 diabetes, hypertension, chronic systolic congestive heart failure with ejection fraction of 35-40% based on a 2-D echocardiogram in October of 2011, chronic kidney disease stage II/III secondary to renal artery stenosis and diabetes, hypertension, history of CVA, history of breast cancer managed by Dr. Gaylyn Rong she has not seen for a long time, hypothyroidism, hyperlipidemia who presented today with syncope.  Patient was recently started on steroids for gout attack.  She had gone to the beach over the weekend and has had frequent indigestion and feeling weak.  This morning after coming out off to shower she felt weak and she passed out.  She was brought to the emergency department for further evaluation.  In the emergency department patient was found to be bradycardic with heart rate in the 40s.  The hospitalist service was asked to admit the patient for further care and management.  Syncope, etiology unclear.  Patient is bradycardic, hold beta blocker and reassess.  Trend troponins.  Head CT negative.  2-D echocardiogram to evaluate ejection fraction.  Monitor on telemetry.  Suspect patient may have a mild dehydration from hyperglycemia given recent steroid use gently hydrate the patient on IV fluids.  Nausea and vomiting.  Suspect is likely due to viral gastroenteritis supportive care continue to monitor.  Type 2 diabetes uncontrolled due to recent steroid use.  Continue Lantus and sliding scale.  Leukocytosis likely due to recent steroid use.  Acute renal failure chronic kidney disease stage III/II. Gently hydrate the patient IV fluids.  Chronic systolic congestive heart failure.  Patient currently compensated.  Continue to monitor.  Bradycardia.  Etiology unclear.  Hold beta blocker.  The heart rate improved consider restarting the patient  on low dose metoprolol.  CODE STATUS full code   Draydon Clairmont A, MD 03/12/2012, 6:21 PM

## 2012-03-12 NOTE — H&P (Signed)
Patient ID: [name] MRN: [id number] DOB/AGE: 11-22-1934 77 y.o.   PCP: [facility], Dr. Angelina Ok  DATE OF ADMISSION: 03/12/2012  CHIEF COMPLAINT: Chief Complaint  Patient presents with  . Weakness    HPI: [name] is an 77 y.o. with pmh of CAD s/p CABG, DM2, htn, CHF and breast cancer presents to ED after syncopal episode today. According to pt, she has not been feeling well since Friday with overall feeling of malaise and frequent "indigestion". She describes "fullness" feeling in chest intermittent since Friday that has since resolved without any intervention. She also reports 2 episodes of vomiting this am. She was in the bathroom this am and husband went to check on her. Pt stated she was not feeling well with some nausea followed by syncopal event prompting husband to call EMS. Husband denies any seizure-like activity.      Of note, pt was seen by PCP last week and rx'd prednisone for acute gout flare in ankle. Her daughter states she had some swelling bilaterally to lower extremities that has since resolved. She denies any recent headache, dizziness, chest pain, abdominal pain, melena, hematochezia, or hematemesis.   PMH: Past Medical History  Diagnosis Date  . Chronic congestive heart failure     Mixed. 2D-echocardiogram (08/2010) - 35% to 40%. Diffuse hypokinesis. Grade 2 diastolic dysfunction.  . Coronary artery disease     s/p 3-vessel CABG (07/2003), s/p NSTEMI (06/2009), LHC (11/2010) - 40% EF with anterior and distal inferior wall motion abnormalities, likely secondary to Adriamycin. LAD stent in proximal region 90% occluded, better from prior 100% occlusion in 2008. RCA diffusely diseased with areas of 60, 70, 80% narrowing - by Dr. Julieanne Manson.  [name] History of renal artery stenosis     Left 95% stenosis s/p left renal artery stenting (09/2009), right renal artery with 50% stenosis.  . Hypertension   . History of CVA (cerebrovascular accident)       left frontal lobe stroke (11/2009), TIA (06/2009, 04/2009) with multiple prior TIAs manifested by aphasia. H/o CVA in 1988 involving right eye.  . DM (diabetes mellitus), type 2, uncontrolled DX: 2001    Stopped of Metformin in 04/2011 in setting of AKI (Cr 3), now on insulin.  [name] History of breast cancer     Primary oncologist Dr. Idelle Leech. // First diagnosed 2000, recurrence in 06/2010. S/P left mastectomy with radiation therapy (2000), following recurrence, started chemothearpy, completed course (06/2010.)  . CKD (chronic kidney disease) stage 2, GFR 60-89 ml/min     BL Cr 1.6  . Hypothyroidism   . Hyperlipidemia   . DJD (degenerative joint disease) of knee     left  . Hx of adenomatous colonic polyps     remote history. Last colonoscopy (03/2004) - Normal colonoscopy to the cecum, rec 5 year follow-up colonoscopy.  [name] History of DVT of lower extremity 07/2003    in setting of saphenous vein grafting for CABG    Past Surgical History  Procedure Date  . Coronary artery bypass graft 07/2003  . Total knee arthroplasty 12/2004, 12/2005    BL, right (2006) by Dr. Luiz Blare. Left (2007) by Dr. Sherlean Foot.  . Simple mastectomy 2000    left, in setting of breast cancer  . Total abdominal hysterectomy   . Lumbar disc surgery     HOME MEDS: Medication List  As of 03/12/2012  4:46 PM   ASK your doctor about these medications  aspirin 325 MG EC tablet   Take 325 mg by mouth 2 (two) times daily.      furosemide 20 MG tablet   Commonly known as: LASIX   Take 20 mg by mouth daily as needed. Swelling.      insulin glargine 100 UNIT/ML injection   Commonly known as: LANTUS   Inject into the skin See admin instructions. Inject 30 to 40 units every night at bedtime.  Taper up as directed by MD.      isosorbide mononitrate 30 MG 24 hr tablet   Commonly known as: IMDUR   Take 30 mg by mouth daily.      levothyroxine 88 MCG tablet   Commonly known as: SYNTHROID, LEVOTHROID   Take 88  mcg by mouth daily.      LORazepam 0.5 MG tablet   Commonly known as: ATIVAN   Take 0.5 mg by mouth at bedtime as needed.      metoprolol tartrate 25 MG tablet   Commonly known as: LOPRESSOR   Take 25 mg by mouth 2 (two) times daily.      PARoxetine 10 MG tablet   Commonly known as: PAXIL   Take 5 mg by mouth daily.             ALLERGIES: No Known Allergies  SOCIAL HX: Married, No hx of tobacco abuse but husband smokes in home. No etoh. Independent of ADLs  FAMILY HX: Reviewed and Edmunds to admit.   ROS: As stated in hpi, otherwise negative    PHYSICAL EXAM:  Vital Signs: Temp:  [97.3 F (36.3 C)-98.4 F (36.9 C)] 98.4 F (36.9 C) (04/22 1200) Pulse Rate:  [44-61] 61  (04/22 1442) Resp:  [13-18] 18  (04/22 1200) BP: (112-162)/(52-70) 154/66 mmHg (04/22 1442) SpO2:  [98 %-100 %] 100 % (04/22 1300) Weight:  [66.225 kg (146 lb)] 66.225 kg (146 lb) (04/22 1010)   General: awake, alert sitting upright in stretcher in NAD Head: normocephalic, atraumtic EENT: mucous membranes slightly dry, no oropharyngeal lesions Neck: supple without TM or lymphadenopathy, no JVD or carotid bruits Chest: symmetrical movement, NT to palpation CV: S1S2 RRR, no m/r/g Resp: CTAB, no w/r/c, no increased wob GI: abdomen soft, NT/ND, BS+, no appreciated masses or hsm. MSK: no obvious deformities Skin: no suspicious rashes or lesions Neuro: MAE x 4 without focal m/s deficit on exam Psych: AOx4, pleasant mood and affect  LABS/STUDIES: Lab Results  Component Value Date   WBC 10.8* 03/12/2012   HGB 16.0* 03/12/2012   HCT 45.9 03/12/2012   MCV 93.9 03/12/2012   PLT 223 03/12/2012     BMET  Lab 03/12/12 1050  NA 132*  K 5.2*  CL 96  CO2 21  GLUCOSE 305*  BUN 43*  CREATININE 1.67*  CALCIUM 10.0  MG --  PHOS --  Results for [name], [name] (MRN [id number]) as of 03/12/2012 16:42  Ref. Range 03/12/2012 10:58  Troponin i, poc Latest Range: 0.00-0.08 ng/mL 0.02      Urinalysis     Component Value Date/Time   COLORURINE YELLOW 03/12/2012 1209   APPEARANCEUR CLEAR 03/12/2012 1209   LABSPEC 1.028 03/12/2012 1209   PHURINE 5.5 03/12/2012 1209   GLUCOSEU >1000* 03/12/2012 1209   HGBUR NEGATIVE 03/12/2012 1209   BILIRUBINUR NEGATIVE 03/12/2012 1209   KETONESUR NEGATIVE 03/12/2012 1209   PROTEINUR NEGATIVE 03/12/2012 1209   UROBILINOGEN 0.2 03/12/2012 1209   NITRITE NEGATIVE 03/12/2012 1209   LEUKOCYTESUR NEGATIVE 03/12/2012 1209  Results for  [name], [name] (MRN [id number]) as of 03/12/2012 16:42  Ref. Range 03/12/2012 12:09  Urine-Other No range found MUCOUS PRESENT  Squamous Epithelial / LPF Latest Range: RARE  RARE  Bacteria, UA Latest Range: RARE  FEW (A)   CT HEAD WITHOUT CONTRAST  Technique: Contiguous axial images were obtained from the base of  the skull through the vertex without contrast.  Comparison: 05/14/2011  Findings: There is no acute hemorrhage, infarction, or mass lesion.  There are old small bilateral frontal infarcts which are unchanged  with focal slight encephalomalacia.  Brain parenchyma is otherwise normal. Osseous structures are  normal except for chronic partial opacification of the left side of  the sphenoid sinus.  IMPRESSION:  No acute intracranial abnormality. Old small infarcts in both  frontal lobes.  Original Report Authenticated By: Gwynn Burly, M.D.  CHEST - 2 VIEW  Comparison: 04/25/2011  Findings: The lungs are clear without focal infiltrate, edema,  pneumothorax or pleural effusion. The cardiopericardial silhouette  is within normal limits for size. The patient is status post CABG.  Imaged bony structures of the thorax are intact.  IMPRESSION:  Stable. No acute findings.  Original Report Authenticated By: ERIC A. MANSELL, M.D.  EKG: sinus bradycardia with no evidence of ischemia  ASSESSMENT & PLAN Patient Active Problem List  Diagnoses  . CHF (congestive heart failure)  . CAD (coronary artery disease) of artery bypass  graft  . Renal artery stenosis  . Hypertension  . CVA (cerebral vascular accident)  . Diabetes mellitus  . Chronic kidney disease (CKD), stage II (mild)  . Hypothyroidism  . Hyperlipidemia  . Bradycardia  . Syncope  . History of DVT (deep vein thrombosis)  . DJD (degenerative joint disease) of knee  . Hx of adenomatous colonic polyps   1. Syncope:  Unclear etiology.  Could be related to bradycardia. Will stop BB and monitor. Cycle cardiac enzymes r/o ACS as pt has been complaining of "indigestion". No acute neurological event on CT head. She does appear to be mildly dehydrated with slightly elevated BUN, will hydrate. Check orthostatics. Check 2Decho to evaluate EF. Monitor on telemetry.   2. Nausea/Vomiting: Now resolved. ? Viral etiology vs side effect with prednisone. Abdominal exam unremarkable. Supportive care, monitor  3. Hyperkalemia, mild: No ekg changes. Recheck in am to determine need for tx  4. DM2: Hyperglycemia likely secondary to prednisone. Continue Lantus, add SSI while inpt.  5. Leukocytosis, mild: ? Reactive with syncopal event. No evidence of infx etiology with normal CXR, afebrile and NT appearing. Will check urine c&s. Hold empiric tx for now.  6. CKD, stage II:  At or near baseline. Monitor.   7. Hypothyroidism: Check TFTs. Continue synthroid  8. Chronic systolic/diastolic HF: Compensated on exam. Repeat echo with syncopal event. Monitor with gentle IVFs.  9. Bradycardia: Hold BB. Monitor.   10. Prophylaxis: SQ lovenox, PPI  11. Code status: Full code  12. Dispo: D/c home when medically stable. Will ask PT to eval.  Cordelia Pen, NP-C [facility]  pgr [phone]

## 2012-03-12 NOTE — ED Notes (Signed)
Per EMS, pt c/o weakness x 3 days, went to PCP 2 days ago and was diagnosed withgout, started on prednisone. Pt states no improvement in weakness and has "noenergy todoanything". Pt states became dizzy this am and called EMS. Pt states HR usually slow in 50's.

## 2012-03-12 NOTE — ED Notes (Signed)
Pt husband states he thinks pt passed out and didn't just get dizzy. Pt states she doesn't think she passed out.

## 2012-03-12 NOTE — ED Notes (Signed)
Bed:WA08<BR> Expected date:<BR> Expected time:<BR> Means of arrival:<BR> Comments:<BR> EMS

## 2012-03-13 DIAGNOSIS — M109 Gout, unspecified: Secondary | ICD-10-CM | POA: Diagnosis present

## 2012-03-13 DIAGNOSIS — Z853 Personal history of malignant neoplasm of breast: Secondary | ICD-10-CM | POA: Diagnosis present

## 2012-03-13 HISTORY — PX: TRANSTHORACIC ECHOCARDIOGRAM: SHX275

## 2012-03-13 LAB — TSH: TSH: 3.972 u[IU]/mL (ref 0.350–4.500)

## 2012-03-13 LAB — URINE CULTURE
Colony Count: NO GROWTH
Culture  Setup Time: 201304230151

## 2012-03-13 LAB — BASIC METABOLIC PANEL
BUN: 32 mg/dL — ABNORMAL HIGH (ref 6–23)
Chloride: 107 mEq/L (ref 96–112)
Creatinine, Ser: 1.59 mg/dL — ABNORMAL HIGH (ref 0.50–1.10)
GFR calc Af Amer: 36 mL/min — ABNORMAL LOW (ref >90)
GFR calc non Af Amer: 31 mL/min — ABNORMAL LOW (ref >90)
Potassium: 4.4 mEq/L (ref 3.5–5.1)

## 2012-03-13 LAB — CARDIAC PANEL(CRET KIN+CKTOT+MB+TROPI)
Total CK: 42 U/L (ref 7–177)
Troponin I: <0.3 ng/mL (ref <0.30)
Troponin I: <0.3 ng/mL (ref <0.30)

## 2012-03-13 LAB — GLUCOSE, CAPILLARY: Glucose-Capillary: 171 mg/dL — ABNORMAL HIGH (ref 70–99)

## 2012-03-13 LAB — CBC
HCT: 39.5 % (ref 36.0–46.0)
MCHC: 33.2 g/dL (ref 30.0–36.0)
Platelets: 172 10*3/uL (ref 150–400)
RDW: 13.4 % (ref 11.5–15.5)
WBC: 6.5 10*3/uL (ref 4.0–10.5)

## 2012-03-13 MED ORDER — COLCHICINE 0.6 MG PO TABS
0.6000 mg | ORAL_TABLET | Freq: Two times a day (BID) | ORAL | Status: DC
  Administered 2012-03-13 – 2012-03-14 (×2): 0.6 mg via ORAL
  Filled 2012-03-13 (×3): qty 1

## 2012-03-13 MED ORDER — METOPROLOL TARTRATE 12.5 MG HALF TABLET
12.5000 mg | ORAL_TABLET | Freq: Two times a day (BID) | ORAL | Status: DC
  Administered 2012-03-13 – 2012-03-14 (×2): 12.5 mg via ORAL
  Filled 2012-03-13 (×3): qty 1

## 2012-03-13 MED ORDER — ALLOPURINOL 100 MG PO TABS
100.0000 mg | ORAL_TABLET | Freq: Two times a day (BID) | ORAL | Status: DC
  Administered 2012-03-13 – 2012-03-14 (×3): 100 mg via ORAL
  Filled 2012-03-13 (×4): qty 1

## 2012-03-13 NOTE — Progress Notes (Signed)
  Echocardiogram 2D Echocardiogram has been performed.  [name] A 03/13/2012, 1:27 PM

## 2012-03-13 NOTE — Progress Notes (Signed)
Pt is refusing IV restart until MD rounds. Will follow up. K. Vear Clock RN

## 2012-03-13 NOTE — Progress Notes (Signed)
   CARE MANAGEMENT NOTE 03/13/2012  Patient:  [name], [name]   Account Number:  [id number]  Date Initiated:  03/13/2012  Documentation initiated by:  Jiles Crocker  Subjective/Objective Assessment:   ADMITTED WITH SYNCOPY     Action/Plan:   PCP IS DR Angelina Ok; LIVES AT HOME WITH SPOUSE   Anticipated DC Date:  03/20/2012   Anticipated DC Plan:  HOME/SELF CARE      Status of service:  In process, will continue to follow Medicare Important Message given?  NA - LOS <3 / Initial given by admissions (If response is "NO", the following Medicare IM given date fields will be blank)  Per UR Regulation:  Reviewed for med. necessity/level of care/duration of stay  Comments:  03/13/2012- B Katilyn Miltenberger RN, BSN, MHA

## 2012-03-13 NOTE — Progress Notes (Signed)
Inpatient Diabetes Program Recommendations  AACE/ADA: New Consensus Statement on Inpatient Glycemic Control (2009)  Target Ranges:  Prepandial:   less than 140 mg/dL      Peak postprandial:   less than 180 mg/dL (1-2 hours)      Critically ill patients:  140 - 180 mg/dL   Reason for Visit: Hyperglycemia  Inpatient Diabetes Program Recommendations Insulin - Basal: Currently receiving Lantus 30 units at HS Correction (SSI): Currently receiving sensitive correction scale Insulin - Meal Coverage: Request MD consider ordering 3 units meal coverage tid with meal and titrate upward according to CBG's HgbA1C: Request MD order Hgb A1C  Note:  Results for [name], [name] (MRN [id number]) as of 03/13/2012 16:43  Ref. Range 03/12/2012 17:06 03/12/2012 22:23 03/13/2012 08:23 03/13/2012 12:14 03/13/2012 15:42  Glucose-Capillary Latest Range: 70-99 mg/dL 914 (H) 782 (H) 956 (H) 243 (H) 280 (H)   Thank you. Braxton Weisbecker S. Elsie Lincoln, RN, CNS, CDE [phone])

## 2012-03-13 NOTE — Evaluation (Signed)
Physical Therapy Evaluation Patient Details Name: [name] MRN: [id number] DOB: Apr 10, 1935 Today's Date: 03/13/2012 Time: 4098-1191 PT Time Calculation (min): 15 min  PT Assessment / Plan / Recommendation Clinical Impression  Pt admitted for syncope.  Pt would benefit from acute PT services in order to improve independence with gait and stairs.  Pt reported no dizziness with ambulation.  Will likely see 1-2 more visits as pt is close to baseline.    PT Assessment  Patient needs continued PT services    Follow Up Recommendations  No PT follow up    Equipment Recommendations  None recommended by PT    Frequency Other (Comment) (1-2 more visits)    Precautions / Restrictions     Pertinent Vitals/Pain       Mobility  Bed Mobility Bed Mobility: Not assessed Transfers Transfers: Sit to Stand;Stand to Sit Sit to Stand: 4: Min guard;From chair/3-in-1 Stand to Sit: 4: Min guard;To chair/3-in-1 Details for Transfer Assistance: pt uses armrests safely Ambulation/Gait Ambulation/Gait Assistance: 5: Supervision Ambulation Distance (Feet): 140 Feet Assistive device: Rolling walker;1 person hand held assist Ambulation/Gait Assistance Details: pt became with 1 HHA and reports R heel pain so given RW to assist with pain control during ambulation Gait Pattern: Antalgic;Decreased stride length;Step-through pattern Gait velocity: decreased    Exercises     PT Goals Acute Rehab PT Goals PT Goal Formulation: With patient Time For Goal Achievement: 03/16/12 Potential to Achieve Goals: Good Pt will Ambulate: >150 feet;with modified independence;with least restrictive assistive device PT Goal: Ambulate - Progress: Goal set today Pt will Go Up / Down Stairs: 1-2 stairs;with supervision;with rail(s) PT Goal: Up/Down Stairs - Progress: Goal set today  Visit Information  Last PT Received On: 03/13/12 Assistance Needed: +1    Subjective Data  Subjective: "I hope I get to go home  today."   Prior Functioning  Home Living Lives With: Spouse Type of Home: House Home Access: Stairs to enter Secretary/administrator of Steps: 2 Entrance Stairs-Rails: Right Home Layout: Other (Comment) (pt reports one step in various parts of house without rails) Home Adaptive Equipment: Walker - rolling Prior Function Level of Independence: Independent Communication Communication: No difficulties    Cognition  Overall Cognitive Status: Appears within functional limits for tasks assessed/performed    Extremity/Trunk Assessment Right Lower Extremity Assessment RLE ROM/Strength/Tone: Within functional levels Left Lower Extremity Assessment LLE ROM/Strength/Tone: Within functional levels   Balance    End of Session PT - End of Session Activity Tolerance: Patient tolerated treatment well Patient left: in chair;with call bell/phone within reach   [facility] E 03/13/2012, 1:39 PM Pager: [phone]

## 2012-03-13 NOTE — Progress Notes (Signed)
Subjective: Patient feeling better today wondering when she can go home.  Complained of right ankle pain from her gout.  No other specific complaints.  Objective: Vital signs in last 24 hours: Filed Vitals:   03/13/12 0530 03/13/12 0532 03/13/12 0536 03/13/12 1424  BP: 115/65 119/72 104/68 128/70  Pulse: 52 62 68 69  Temp: 98.2 F (36.8 C)   97.8 F (36.6 C)  TempSrc: Oral   Oral  Resp: 18   20  Height:      Weight:      SpO2: 100%   99%   Weight change:   Intake/Output Summary (Last 24 hours) at 03/13/12 1545 Last data filed at 03/13/12 1300  Gross per 24 hour  Intake    720 ml  Output      0 ml  Net    720 ml    Physical Exam: General: Awake, Oriented, No acute distress. HEENT: EOMI. Neck: Supple CV: S1 and S2 Lungs: Clear to ascultation bilaterally Abdomen: Soft, Nontender, Nondistended, +bowel sounds. Ext: Good pulses. Trace edema.  Patient able to dorsiflex and plantar flex right ankle with minimal pain.  No obvious swelling noted.  Lab Results:  Basename 03/13/12 0454 03/12/12 1050  NA 138 132*  K 4.4 5.2*  CL 107 96  CO2 24 21  GLUCOSE 174* 305*  BUN 32* 43*  CREATININE 1.59* 1.67*  CALCIUM 8.4 10.0  MG -- --  PHOS -- --    Basename 03/12/12 1050  AST 33  ALT 25  ALKPHOS 99  BILITOT 0.5  PROT 8.3  ALBUMIN 3.9   No results found for this basename: LIPASE:2,AMYLASE:2 in the last 72 hours  Basename 03/13/12 0454 03/12/12 1050  WBC 6.5 10.8*  NEUTROABS -- 8.0*  HGB 12.9 16.0*  HCT 39.5 45.9  MCV 96.1 93.9  PLT 172 223    Basename 03/13/12 0940 03/13/12 0454 03/12/12 2011  CKTOTAL 42 35 40  CKMB 3.0 2.7 2.8  CKMBINDEX -- -- --  TROPONINI <0.30 <0.30 <0.30   No components found with this basename: POCBNP:3 No results found for this basename: DDIMER:2 in the last 72 hours No results found for this basename: HGBA1C:2 in the last 72 hours No results found for this basename: CHOL:2,HDL:2,LDLCALC:2,TRIG:2,CHOLHDL:2,LDLDIRECT:2 in the last 72  hours  Basename 03/12/12 2012  TSH 3.972  T4TOTAL --  T3FREE --  THYROIDAB --   No results found for this basename: VITAMINB12:2,FOLATE:2,FERRITIN:2,TIBC:2,IRON:2,RETICCTPCT:2 in the last 72 hours  Micro Results: No results found for this or any previous visit (from the past 240 hour(s)).  Studies/Results: Dg Chest 2 View  03/12/2012  *RADIOLOGY REPORT*  Clinical Data: Weakness and CHF.  CHEST - 2 VIEW  Comparison: 04/25/2011  Findings: The lungs are clear without focal infiltrate, edema, pneumothorax or pleural effusion. The cardiopericardial silhouette is within normal limits for size.  The patient is status post CABG. Imaged bony structures of the thorax are intact.  IMPRESSION: Stable.  No acute findings.  Original Report Authenticated By: ERIC A. MANSELL, M.D.   Ct Head Wo Contrast  03/12/2012  *RADIOLOGY REPORT*  Clinical Data: Generalized weakness.  Nausea.  CT HEAD WITHOUT CONTRAST  Technique:  Contiguous axial images were obtained from the base of the skull through the vertex without contrast.  Comparison: 05/14/2011  Findings: There is no acute hemorrhage, infarction, or mass lesion. There are old small bilateral frontal infarcts which are unchanged with focal slight encephalomalacia.  Brain parenchyma is otherwise normal.  Osseous structures are normal  except for chronic partial opacification of the left side of the sphenoid sinus.  IMPRESSION: No acute intracranial abnormality.  Old small infarcts in both frontal lobes.  Original Report Authenticated By: Gwynn Burly, M.D.    Medications: I have reviewed the patient's current medications. Scheduled Meds:   . allopurinol  100 mg Oral BID  . aspirin  325 mg Oral BID  . colchicine  0.6 mg Oral BID  . enoxaparin  40 mg Subcutaneous Q24H  . insulin aspart  0-5 Units Subcutaneous QHS  . insulin aspart  0-9 Units Subcutaneous TID WC  . insulin aspart  0-9 Units Subcutaneous Once  . insulin glargine  30 Units Subcutaneous QHS    . isosorbide mononitrate  30 mg Oral Daily  . levothyroxine  88 mcg Oral Daily  . metoprolol tartrate  12.5 mg Oral BID  . nitroGLYCERIN      . PARoxetine  5 mg Oral Daily  . DISCONTD: colchicine  0.6 mg Oral Daily  . DISCONTD: insulin glargine  20 Units Subcutaneous QHS   Continuous Infusions:   . DISCONTD: sodium chloride 50 mL/hr at 03/12/12 1810   PRN Meds:.acetaminophen, acetaminophen, LORazepam, nitroGLYCERIN, ondansetron (ZOFRAN) IV, ondansetron  Assessment/Plan: Syncope:  Unclear etiology. Could be related to bradycardia.  2-D echocardiogram pending.  Patient ruled out for acute coronary syndrome with negative troponins.  Restart beta blocker but at lower dose, metoprolol 12.5 mg twice daily with hold parameters.if patient bradycardic on metoprolol discontinue metoprolol.  Patient had orthostatic vitals checked this morning and was not orthostatic.  Head CT performed on 03/12/2012 showed no abnormalities.  No PT needs after being evaluated by physical therapy.  Nausea/Vomiting:  Now resolved. Viral etiology vs side effect with prednisone. Abdominal exam unremarkable. Supportive care, monitor.  Hyperkalemia, mild:  Resolved.    DM2, uncontrolled with complications  Hyperglycemia likely secondary to prednisone. Continue Lantus, add SSI while inpt.   Leukocytosis, mild:  ? Reactive with syncopal event. No evidence of infx etiology with normal CXR, afebrile and NT appearing. Will check urine c&s pending. Hold empiric tx for now.   CKD, stage II/history of renal artery stenosis:  At or near baseline. Monitor.   Hypothyroidism:  TSH normal at 3.972. Continue synthroid.  Chronic systolic/diastolic HF:  From chemotherapy.  Compensated on exam. Repeat echo with syncopal event, results pending.  Was initially given gentle hydration, saline locked.   Bradycardia:  Resolved with holding beta blocker.  Resume low-dose metoprolol as indicated above.  Gout Discontinued  prednisone.  Start the patient on colchicine which likely can be discontinued after 3 more day course.start the patient on allopurinol.  Further management as outpatient.  History of breast cancer  Has not followed up with Dr. Gaylyn Rong for a while.  Encourage the patient to follow up with Dr. Gaylyn Rong as outpatient for further care and monitoring.  Prophylaxis:  SQ lovenox, PPI   Code status:  Full code   Dispo:  D/c home in 24 hours once results of 2-D echocardiogram available and if patient is not bradycardic on lower than home dose metoprolol.     LOS: 1 day  Chung Chagoya A, MD 03/13/2012, 3:45 PM

## 2012-03-14 LAB — BASIC METABOLIC PANEL
BUN: 29 mg/dL — ABNORMAL HIGH (ref 6–23)
CO2: 21 mEq/L (ref 19–32)
Calcium: 8.9 mg/dL (ref 8.4–10.5)
Creatinine, Ser: 1.53 mg/dL — ABNORMAL HIGH (ref 0.50–1.10)
GFR calc non Af Amer: 32 mL/min — ABNORMAL LOW (ref >90)
Glucose, Bld: 233 mg/dL — ABNORMAL HIGH (ref 70–99)
Sodium: 138 mEq/L (ref 135–145)

## 2012-03-14 LAB — CBC
Hemoglobin: 13.4 g/dL (ref 12.0–15.0)
MCH: 31.9 pg (ref 26.0–34.0)
MCHC: 34 g/dL (ref 30.0–36.0)
MCV: 93.8 fL (ref 78.0–100.0)
RBC: 4.2 MIL/uL (ref 3.87–5.11)

## 2012-03-14 MED ORDER — COLCHICINE 0.6 MG PO TABS: 0.6000 mg | ORAL_TABLET | Freq: Two times a day (BID) | ORAL | Status: DC

## 2012-03-14 MED ORDER — ALLOPURINOL 100 MG PO TABS: 100.0000 mg | ORAL_TABLET | Freq: Two times a day (BID) | ORAL | Status: DC

## 2012-03-14 MED ORDER — ACETAMINOPHEN 325 MG PO TABS: 650.0000 mg | ORAL_TABLET | Freq: Four times a day (QID) | ORAL | Status: AC | PRN

## 2012-03-14 MED ORDER — METOPROLOL TARTRATE 25 MG PO TABS: 12.5000 mg | ORAL_TABLET | Freq: Two times a day (BID) | ORAL | Status: DC

## 2012-03-14 MED ORDER — INSULIN GLARGINE 100 UNIT/ML ~~LOC~~ SOLN: 35.0000 [IU] | Freq: Every day | SUBCUTANEOUS | Status: DC

## 2012-03-29 NOTE — Discharge Summary (Signed)
Physician Discharge Summary  Patient ID: [name] MRN: [id number] DOB/AGE: 05/28/1935 77 y.o.  Admit date: 03/12/2012 Discharge date: 03/29/2012  Primary Care Physician:  Ailene Ravel, MD, MD   Discharge Diagnoses:   1. Syncope: vasovagal vs secondary to bradycardia  2. Chronic systolic congestive heart failure  3. CAD (coronary artery disease) of artery bypass graft  4. DM (diabetes mellitus), type 2, uncontrolled with complications  5. Chronic kidney disease (CKD), stage II (mild)  6. Bradycardia  7. Gout flare of R ankle  8. History of breast cancer    Medication List  As of 03/29/2012  8:45 PM   TAKE these medications         acetaminophen 325 MG tablet   Commonly known as: TYLENOL   Take 2 tablets (650 mg total) by mouth every 6 (six) hours as needed (or Fever >/= 101).      allopurinol 100 MG tablet   Commonly known as: ZYLOPRIM   Take 1 tablet (100 mg total) by mouth 2 (two) times daily.      aspirin 325 MG EC tablet   Take 325 mg by mouth 2 (two) times daily.      colchicine 0.6 MG tablet   Take 1 tablet (0.6 mg total) by mouth 2 (two) times daily.      furosemide 20 MG tablet   Commonly known as: LASIX   Take 20 mg by mouth daily as needed. Swelling.      insulin glargine 100 UNIT/ML injection   Commonly known as: LANTUS   Inject 35 Units into the skin at bedtime. Inject 30 to 40 units every night at bedtime.  Taper up as directed by MD.      isosorbide mononitrate 30 MG 24 hr tablet   Commonly known as: IMDUR   Take 30 mg by mouth daily.      levothyroxine 88 MCG tablet   Commonly known as: SYNTHROID, LEVOTHROID   Take 88 mcg by mouth daily.      LORazepam 0.5 MG tablet   Commonly known as: ATIVAN   Take 0.5 mg by mouth at bedtime as needed.      metoprolol tartrate 25 MG tablet   Commonly known as: LOPRESSOR   Take 0.5 tablets (12.5 mg total) by mouth 2 (two) times daily.      PARoxetine 10 MG tablet   Commonly known as: PAXIL   Take 5  mg by mouth daily.             Disposition and Follow-up:  PCP in 1 week  Significant Diagnostic Studies:  CT head: IMPRESSION:  No acute intracranial abnormality. Old small infarcts in both  frontal lobes.   CHEST - 2 VIEW  IMPRESSION:  Stable. No acute findings.    EKG: sinus bradycardia with no evidence of ischemia   2D ECHO 4/23 Study Conclusions: - Left ventricle: The cavity size was normal. There was mild focal basal hypertrophy of the septum. Systolic function was normal. The estimated ejection fraction was in the range of 50% to 55%. There may be mild basal to mid septal hypokinesis to dyskinesis. The basal septum appears bright, but not thinned. Doppler parameters are consistent with abnormal left ventricular relaxation (grade 1 diastolic dysfunction). The E/e' ratio is >10, suggesting elevated LV filling pressure. - Aortic valve: Sclerosis without stenosis. - Mitral valve: Trivial regurgitation. - Left atrium: The atrium was normal in size. - Inferior vena cava: The vessel was normal in size; the  respirophasic diameter changes were in the normal range (= 50%); findings are consistent with normal central venous pressure.    Brief H and P: [name] is an 77 y.o. with pmh of CAD s/p CABG, DM2, htn, CHF and breast cancer presents to ED after syncopal episode today. According to pt, she has not been feeling well since Friday with overall feeling of malaise and frequent "indigestion". She describes "fullness" feeling in chest intermittent since Friday that has since resolved without any intervention. She also reports 2 episodes of vomiting this am. She was in the bathroom this am and husband went to check on her. Pt stated she was not feeling well with some nausea followed by syncopal event prompting husband to call EMS. Husband denies any seizure-like activity.  Of note, pt was seen by PCP last week and rx'd prednisone for acute gout flare in ankle. Her  daughter states she had some swelling bilaterally to lower extremities that has since resolved. She denies any recent headache, dizziness, chest pain, abdominal pain, melena, hematochezia, or hematemesis.    Hospital Course:  Syncope:  Felt to be vasovagal, given accompanying indigestion and vomiting: and also Could be related to bradycardia.  EKG without acute ST T wave changes Ruled out for MI based on cardiac enzymes 2-D echocardiogram as noted above  Restarted beta blocker but at lower dose, metoprolol 12.5 mg twice daily, then did not have further episodes of bradycardia Head CT performed on 03/12/2012 showed no abnormalities.  No PT needs after being evaluated by physical therapy.   Nausea/Vomiting:  Now resolved. Viral etiology vs side effect with prednisone. Abdominal exam unremarkable, resolved  Hyperkalemia, mild:  Resolved.   CKD, stage II/history of renal artery stenosis:  At or near baseline. Monitor.   Hypothyroidism:  TSH normal at 3.972. Continue synthroid.   Chronic systolic/diastolic HF:  From chemotherapy. Compensated on exam. Repeat echo as noted above.  Gout  Discontinued prednisone. Started the patient on colchicine which likely can be discontinued after 3 more day course.start the patient on allopurinol. Further management as outpatient.   History of breast cancer  Has not followed up with Dr. Gaylyn Rong for a while. Encourage the patient to follow up with Dr. Gaylyn Rong as outpatient for further care and monitoring.      Time spent on Discharge:  Signed: Merriel Zinger [facility]  03/29/2012, 8:45 PM

## 2014-06-04 ENCOUNTER — Emergency Department (HOSPITAL_COMMUNITY): Payer: Medicare Other

## 2014-06-04 ENCOUNTER — Encounter (HOSPITAL_COMMUNITY): Payer: Self-pay | Admitting: Emergency Medicine

## 2014-06-04 ENCOUNTER — Inpatient Hospital Stay (HOSPITAL_COMMUNITY)
Admission: EM | Admit: 2014-06-04 | Discharge: 2014-06-08 | DRG: 247 | Disposition: A | Discharge status: Home / Self Care | Payer: Medicare Other | Attending: Cardiology | Admitting: Cardiology

## 2014-06-04 DIAGNOSIS — E876 Hypokalemia: Secondary | ICD-10-CM | POA: Diagnosis not present

## 2014-06-04 DIAGNOSIS — N182 Chronic kidney disease, stage 2 (mild): Secondary | ICD-10-CM | POA: Diagnosis present

## 2014-06-04 DIAGNOSIS — I70209 Unspecified atherosclerosis of native arteries of extremities, unspecified extremity: Secondary | ICD-10-CM | POA: Diagnosis present

## 2014-06-04 DIAGNOSIS — I2582 Chronic total occlusion of coronary artery: Secondary | ICD-10-CM | POA: Diagnosis present

## 2014-06-04 DIAGNOSIS — I701 Atherosclerosis of renal artery: Secondary | ICD-10-CM | POA: Diagnosis present

## 2014-06-04 DIAGNOSIS — Z96659 Presence of unspecified artificial knee joint: Secondary | ICD-10-CM

## 2014-06-04 DIAGNOSIS — I214 Non-ST elevation (NSTEMI) myocardial infarction: Principal | ICD-10-CM

## 2014-06-04 DIAGNOSIS — I509 Heart failure, unspecified: Secondary | ICD-10-CM | POA: Diagnosis present

## 2014-06-04 DIAGNOSIS — D649 Anemia, unspecified: Secondary | ICD-10-CM | POA: Diagnosis present

## 2014-06-04 DIAGNOSIS — Z853 Personal history of malignant neoplasm of breast: Secondary | ICD-10-CM

## 2014-06-04 DIAGNOSIS — Z9010 Acquired absence of unspecified breast and nipple: Secondary | ICD-10-CM

## 2014-06-04 DIAGNOSIS — F0390 Unspecified dementia without behavioral disturbance: Secondary | ICD-10-CM | POA: Diagnosis present

## 2014-06-04 DIAGNOSIS — IMO0002 Reserved for concepts with insufficient information to code with codable children: Secondary | ICD-10-CM | POA: Diagnosis present

## 2014-06-04 DIAGNOSIS — I69920 Aphasia following unspecified cerebrovascular disease: Secondary | ICD-10-CM

## 2014-06-04 DIAGNOSIS — E039 Hypothyroidism, unspecified: Secondary | ICD-10-CM | POA: Diagnosis present

## 2014-06-04 DIAGNOSIS — Z86718 Personal history of other venous thrombosis and embolism: Secondary | ICD-10-CM

## 2014-06-04 DIAGNOSIS — I129 Hypertensive chronic kidney disease with stage 1 through stage 4 chronic kidney disease, or unspecified chronic kidney disease: Secondary | ICD-10-CM | POA: Diagnosis present

## 2014-06-04 DIAGNOSIS — E785 Hyperlipidemia, unspecified: Secondary | ICD-10-CM | POA: Diagnosis present

## 2014-06-04 DIAGNOSIS — I25700 Atherosclerosis of coronary artery bypass graft(s), unspecified, with unstable angina pectoris: Secondary | ICD-10-CM

## 2014-06-04 DIAGNOSIS — I5042 Chronic combined systolic (congestive) and diastolic (congestive) heart failure: Secondary | ICD-10-CM | POA: Diagnosis present

## 2014-06-04 DIAGNOSIS — I25810 Atherosclerosis of coronary artery bypass graft(s) without angina pectoris: Secondary | ICD-10-CM | POA: Diagnosis present

## 2014-06-04 DIAGNOSIS — I252 Old myocardial infarction: Secondary | ICD-10-CM

## 2014-06-04 DIAGNOSIS — I10 Essential (primary) hypertension: Secondary | ICD-10-CM

## 2014-06-04 DIAGNOSIS — E118 Type 2 diabetes mellitus with unspecified complications: Secondary | ICD-10-CM

## 2014-06-04 DIAGNOSIS — E1165 Type 2 diabetes mellitus with hyperglycemia: Secondary | ICD-10-CM | POA: Diagnosis present

## 2014-06-04 DIAGNOSIS — Z9861 Coronary angioplasty status: Secondary | ICD-10-CM | POA: Diagnosis not present

## 2014-06-04 DIAGNOSIS — E1169 Type 2 diabetes mellitus with other specified complication: Secondary | ICD-10-CM

## 2014-06-04 DIAGNOSIS — I5022 Chronic systolic (congestive) heart failure: Secondary | ICD-10-CM

## 2014-06-04 DIAGNOSIS — I447 Left bundle-branch block, unspecified: Secondary | ICD-10-CM | POA: Diagnosis present

## 2014-06-04 DIAGNOSIS — I2510 Atherosclerotic heart disease of native coronary artery without angina pectoris: Secondary | ICD-10-CM | POA: Diagnosis present

## 2014-06-04 DIAGNOSIS — I639 Cerebral infarction, unspecified: Secondary | ICD-10-CM

## 2014-06-04 HISTORY — DX: Type 2 diabetes mellitus with unspecified complications: E11.8

## 2014-06-04 HISTORY — DX: Type 2 diabetes mellitus with hyperglycemia: E11.65

## 2014-06-04 HISTORY — DX: Coronary angioplasty status: Z98.61

## 2014-06-04 HISTORY — DX: Unspecified dementia, unspecified severity, without behavioral disturbance, psychotic disturbance, mood disturbance, and anxiety: F03.90

## 2014-06-04 HISTORY — DX: Reserved for concepts with insufficient information to code with codable children: IMO0002

## 2014-06-04 HISTORY — DX: Atherosclerotic heart disease of native coronary artery without angina pectoris: I25.10

## 2014-06-04 HISTORY — DX: Atherosclerosis of autologous vein coronary artery bypass graft(s) with unstable angina pectoris: I25.710

## 2014-06-04 HISTORY — DX: Chronic combined systolic (congestive) and diastolic (congestive) heart failure: I50.42

## 2014-06-04 HISTORY — DX: Essential (primary) hypertension: I10

## 2014-06-04 LAB — CBC WITH DIFFERENTIAL/PLATELET
BASOS ABS: 0 10*3/uL (ref 0.0–0.1)
Basophils Relative: 0 % (ref 0–1)
EOS ABS: 0.1 10*3/uL (ref 0.0–0.7)
EOS PCT: 1 % (ref 0–5)
HCT: 37.6 % (ref 36.0–46.0)
Hemoglobin: 13 g/dL (ref 12.0–15.0)
Lymphocytes Relative: 17 % (ref 12–46)
Lymphs Abs: 1.4 10*3/uL (ref 0.7–4.0)
MCH: 31.2 pg (ref 26.0–34.0)
MCHC: 34.6 g/dL (ref 30.0–36.0)
MCV: 90.2 fL (ref 78.0–100.0)
Monocytes Absolute: 0.5 10*3/uL (ref 0.1–1.0)
Monocytes Relative: 6 % (ref 3–12)
Neutro Abs: 6.5 10*3/uL (ref 1.7–7.7)
Neutrophils Relative %: 76 % (ref 43–77)
PLATELETS: 239 10*3/uL (ref 150–400)
RBC: 4.17 MIL/uL (ref 3.87–5.11)
RDW: 12.6 % (ref 11.5–15.5)
WBC: 8.5 10*3/uL (ref 4.0–10.5)

## 2014-06-04 LAB — COMPREHENSIVE METABOLIC PANEL
ALT: 20 U/L (ref 0–35)
AST: 43 U/L — AB (ref 0–37)
Albumin: 3.1 g/dL — ABNORMAL LOW (ref 3.5–5.2)
Alkaline Phosphatase: 100 U/L (ref 39–117)
Anion gap: 15 (ref 5–15)
BUN: 20 mg/dL (ref 6–23)
CALCIUM: 9.1 mg/dL (ref 8.4–10.5)
CO2: 23 mEq/L (ref 19–32)
Chloride: 101 mEq/L (ref 96–112)
Creatinine, Ser: 1.47 mg/dL — ABNORMAL HIGH (ref 0.50–1.10)
GFR calc non Af Amer: 33 mL/min — ABNORMAL LOW (ref >90)
GFR, EST AFRICAN AMERICAN: 39 mL/min — AB (ref >90)
GLUCOSE: 362 mg/dL — AB (ref 70–99)
Potassium: 4.5 mEq/L (ref 3.7–5.3)
SODIUM: 139 meq/L (ref 137–147)
TOTAL PROTEIN: 6.8 g/dL (ref 6.0–8.3)
Total Bilirubin: 0.3 mg/dL (ref 0.3–1.2)

## 2014-06-04 LAB — PRO B NATRIURETIC PEPTIDE: PRO B NATRI PEPTIDE: 597.2 pg/mL — AB (ref 0–450)

## 2014-06-04 LAB — TROPONIN I: Troponin I: >20 ng/mL (ref <0.30)

## 2014-06-04 LAB — PROTIME-INR
INR: 1.06 (ref 0.00–1.49)
Prothrombin Time: 13.8 seconds (ref 11.6–15.2)

## 2014-06-04 LAB — I-STAT TROPONIN, ED: TROPONIN I, POC: 5.22 ng/mL — AB (ref 0.00–0.08)

## 2014-06-04 LAB — APTT: APTT: 28 s (ref 24–37)

## 2014-06-04 LAB — GLUCOSE, CAPILLARY: Glucose-Capillary: 361 mg/dL — ABNORMAL HIGH (ref 70–99)

## 2014-06-04 LAB — MRSA PCR SCREENING: MRSA BY PCR: NEGATIVE

## 2014-06-04 MED ORDER — MORPHINE SULFATE 2 MG/ML IJ SOLN
2.0000 mg | Freq: Once | INTRAMUSCULAR | Status: AC
  Administered 2014-06-04: 2 mg via INTRAVENOUS
  Filled 2014-06-04: qty 1

## 2014-06-04 MED ORDER — ASPIRIN EC 81 MG PO TBEC
81.0000 mg | DELAYED_RELEASE_TABLET | Freq: Every day | ORAL | Status: DC
  Administered 2014-06-06 – 2014-06-08 (×3): 81 mg via ORAL
  Filled 2014-06-04 (×3): qty 1

## 2014-06-04 MED ORDER — ACETAMINOPHEN 325 MG PO TABS: 650.0000 mg | ORAL_TABLET | ORAL | Status: DC | PRN

## 2014-06-04 MED ORDER — ATORVASTATIN CALCIUM 80 MG PO TABS
80.0000 mg | ORAL_TABLET | Freq: Every day | ORAL | Status: DC
  Administered 2014-06-04 – 2014-06-07 (×3): 80 mg via ORAL
  Filled 2014-06-04 (×5): qty 1

## 2014-06-04 MED ORDER — NITROGLYCERIN 0.4 MG SL SUBL: 0.4000 mg | SUBLINGUAL_TABLET | SUBLINGUAL | Status: DC | PRN

## 2014-06-04 MED ORDER — SODIUM CHLORIDE 0.9 % IV SOLN: 1.0000 mL/kg/h | INTRAVENOUS | Status: DC

## 2014-06-04 MED ORDER — METOPROLOL TARTRATE 12.5 MG HALF TABLET
12.5000 mg | ORAL_TABLET | Freq: Two times a day (BID) | ORAL | Status: DC
  Administered 2014-06-05 – 2014-06-06 (×2): 12.5 mg via ORAL
  Filled 2014-06-04 (×5): qty 1

## 2014-06-04 MED ORDER — SODIUM CHLORIDE 0.9 % IJ SOLN: 3.0000 mL | INTRAMUSCULAR | Status: DC | PRN

## 2014-06-04 MED ORDER — INSULIN ASPART 100 UNIT/ML ~~LOC~~ SOLN
0.0000 [IU] | Freq: Every day | SUBCUTANEOUS | Status: DC
  Administered 2014-06-04: 21:00:00 5 [IU] via SUBCUTANEOUS

## 2014-06-04 MED ORDER — SODIUM CHLORIDE 0.9 % IV SOLN: 250.0000 mL | INTRAVENOUS | Status: DC | PRN

## 2014-06-04 MED ORDER — ASPIRIN EC 81 MG PO TBEC: 81.0000 mg | DELAYED_RELEASE_TABLET | Freq: Every day | ORAL | Status: DC

## 2014-06-04 MED ORDER — INSULIN ASPART 100 UNIT/ML ~~LOC~~ SOLN
0.0000 [IU] | Freq: Three times a day (TID) | SUBCUTANEOUS | Status: DC
  Administered 2014-06-05: 5 [IU] via SUBCUTANEOUS
  Administered 2014-06-05 (×2): 2 [IU] via SUBCUTANEOUS
  Administered 2014-06-06: 14:00:00 3 [IU] via SUBCUTANEOUS
  Administered 2014-06-06 (×2): 2 [IU] via SUBCUTANEOUS
  Administered 2014-06-07: 5 [IU] via SUBCUTANEOUS
  Administered 2014-06-07: 3 [IU] via SUBCUTANEOUS
  Administered 2014-06-07: 09:00:00 1 [IU] via SUBCUTANEOUS
  Administered 2014-06-08: 3 [IU] via SUBCUTANEOUS
  Administered 2014-06-08: 1 [IU] via SUBCUTANEOUS

## 2014-06-04 MED ORDER — HEPARIN BOLUS VIA INFUSION
3000.0000 [IU] | Freq: Once | INTRAVENOUS | Status: AC
Start: 2014-06-04 — End: 2014-06-04
  Administered 2014-06-04: 3000 [IU] via INTRAVENOUS
  Filled 2014-06-04: qty 3000

## 2014-06-04 MED ORDER — INSULIN GLARGINE 100 UNIT/ML ~~LOC~~ SOLN
40.0000 [IU] | Freq: Every day | SUBCUTANEOUS | Status: DC
  Administered 2014-06-04 – 2014-06-07 (×4): 40 [IU] via SUBCUTANEOUS
  Filled 2014-06-04 (×5): qty 0.4

## 2014-06-04 MED ORDER — MORPHINE SULFATE 2 MG/ML IJ SOLN
2.0000 mg | INTRAMUSCULAR | Status: DC | PRN
  Administered 2014-06-04: 2 mg via INTRAVENOUS
  Filled 2014-06-04: qty 1

## 2014-06-04 MED ORDER — SODIUM CHLORIDE 0.9 % IV SOLN
INTRAVENOUS | Status: AC
  Administered 2014-06-04: 20:00:00 via INTRAVENOUS

## 2014-06-04 MED ORDER — SODIUM CHLORIDE 0.9 % IJ SOLN: 3.0000 mL | Freq: Two times a day (BID) | INTRAMUSCULAR | Status: DC

## 2014-06-04 MED ORDER — HEPARIN (PORCINE) IN NACL 100-0.45 UNIT/ML-% IJ SOLN
800.0000 [IU]/h | INTRAMUSCULAR | Status: DC
  Administered 2014-06-04: 800 [IU]/h via INTRAVENOUS
  Filled 2014-06-04: qty 250

## 2014-06-04 MED ORDER — ONDANSETRON HCL 4 MG/2ML IJ SOLN
4.0000 mg | Freq: Four times a day (QID) | INTRAMUSCULAR | Status: DC | PRN
  Administered 2014-06-05: 4 mg via INTRAVENOUS
  Filled 2014-06-04 (×2): qty 2

## 2014-06-04 MED ORDER — ASPIRIN 81 MG PO CHEW
81.0000 mg | CHEWABLE_TABLET | ORAL | Status: AC
  Administered 2014-06-05: 06:00:00 81 mg via ORAL
  Filled 2014-06-04: qty 1

## 2014-06-04 MED ORDER — NITROGLYCERIN IN D5W 200-5 MCG/ML-% IV SOLN
2.0000 ug/min | Freq: Once | INTRAVENOUS | Status: AC
  Administered 2014-06-04: 10 ug/min via INTRAVENOUS
  Filled 2014-06-04 (×2): qty 250

## 2014-06-04 NOTE — ED Notes (Signed)
Abnormal labs given to Dr. Tomi Bamberger

## 2014-06-04 NOTE — Consult Note (Signed)
ANTICOAGULATION CONSULT NOTE - Initial Consult  Pharmacy Consult for Heparin Indication: chest pain/ACS  No Known Allergies  Patient Measurements: Height: 5\' 2"  (157.5 cm) Weight: 160 lb (72.576 kg) IBW/kg (Calculated) : 50.1 Heparin Dosing Weight: 65.5kg  Vital Signs: Temp: 97.8 F (36.6 C) (07/15 1721) Temp src: Oral (07/15 1721) BP: 127/53 mmHg (07/15 1823) Pulse Rate: 57 (07/15 1800)  Labs:  Recent Labs  06/04/14 1731  HGB 13.0  HCT 37.6  PLT 239  APTT 28  LABPROT 13.8  INR 1.06    Estimated Creatinine Clearance: 29.2 ml/min (by C-G formula based on Cr of 1.53).   Medical History: Past Medical History  Diagnosis Date  . Chronic congestive heart failure     Mixed. 2D-echocardiogram (08/2010) - 35% to 40%. Diffuse hypokinesis. Grade 2 diastolic dysfunction.  . Coronary artery disease     s/p 3-vessel CABG (07/2003), s/p NSTEMI (06/2009), LHC (11/2010) - 40% EF with anterior and distal inferior wall motion abnormalities, likely secondary to Adriamycin. LAD stent in proximal region 90% occluded, better from prior 100% occlusion in 2008. RCA diffusely diseased with areas of 60, 70, 80% narrowing - by Dr. Chase Picket.  [name] History of renal artery stenosis     Left 95% stenosis s/p left renal artery stenting (09/2009), right renal artery with 50% stenosis.  . Hypertension   . History of CVA (cerebrovascular accident)     left frontal lobe stroke (11/2009), TIA (06/2009, 04/2009) with multiple prior TIAs manifested by aphasia. H/o CVA in 1988 involving right eye.  . DM (diabetes mellitus), type 2, uncontrolled DX: 2001    Stopped of Metformin in 04/2011 in setting of AKI (Cr 3), now on insulin.  [name] History of breast cancer     Primary oncologist Dr. Brigitte Pulse. // First diagnosed 2000, recurrence in 06/2010. S/P left mastectomy with radiation therapy (2000), following recurrence, started chemothearpy, completed course (06/2010.)  . CKD (chronic kidney disease) stage 2,  GFR 60-89 ml/min     BL Cr 1.6  . Hypothyroidism   . Hyperlipidemia   . DJD (degenerative joint disease) of knee     left  . Hx of adenomatous colonic polyps     remote history. Last colonoscopy (03/2004) - Normal colonoscopy to the cecum, rec 5 year follow-up colonoscopy.  [name] History of DVT of lower extremity 07/2003    in setting of saphenous vein grafting for CABG  . Dementia     Medications:  No anticoagulants pta  Assessment: 78yof with hx CABG presents to the ED with chest pain. First troponin is positive at 5. She will begin IV heparin. CBC wnl. CMP pending - sCr was 1.5 back in April 2013.   Goal of Therapy:  Heparin level 0.3-0.7 units/ml Monitor platelets by anticoagulation protocol: Yes   Plan:  1) Heparin bolus 3000 units x 1 2) Heparin drip at 800 units/hr 3) 8 hour heparin level 4) Daily heparin level and CBC  Deboraha Sprang 06/04/2014,6:39 PM

## 2014-06-04 NOTE — ED Notes (Addendum)
Chest pain onset three hours. 324 asa, 3 nitro received prior to arrival. Left bundle branch on monitor. Chest pain to L side, no nausea at present. Did vomit when pain first started. Denies SOB. Pain increases with deep breathing.  History of heart cath and triple bypass. Pt bp dropped into 90's with nitro.

## 2014-06-04 NOTE — ED Notes (Signed)
Phlebotomy at bedside.

## 2014-06-04 NOTE — ED Provider Notes (Signed)
CSN: [id number]     Arrival date & time 06/04/14  1701 History   First MD Initiated Contact with Patient 06/04/14 1708     Chief complaint chest pain   (Consider location/radiation/quality/duration/timing/severity/associated sxs/prior Treatment) HPI Patient reports before lunch she started getting upper central chest pain that now is in her left upper chest. She describes pain as achy. She states the pain has been constant. She had nausea and vomited once. She denies diaphoresis, shortness of breath, or radiation of the pain. She states she has had this pain before. She states about a month ago she had the pain however she did not seek medical treatment. She states nothing she does makes the pain worse, nothing she does makes the pain better. She does not remember what she was doing when the pain started. Patient states her pain was worse at home before coming to the ED. She is unable to quantify it. Her current pain appears to be about level 6/10. EMS gave her 325 mg aspirin and 3 nitroglycerin. However her blood pressure dropped to the 90s.  PCP PA Tobie Lords with Dr [facility] Cardiologist Dr Rockne Menghini,  ? Dr Sallyanne Kuster  Past Medical History  Diagnosis Date  . Chronic congestive heart failure     Mixed. 2D-echocardiogram (08/2010) - 35% to 40%. Diffuse hypokinesis. Grade 2 diastolic dysfunction.  . Coronary artery disease     s/p 3-vessel CABG (07/2003), s/p NSTEMI (06/2009), LHC (11/2010) - 40% EF with anterior and distal inferior wall motion abnormalities, likely secondary to Adriamycin. LAD stent in proximal region 90% occluded, better from prior 100% occlusion in 2008. RCA diffusely diseased with areas of 60, 70, 80% narrowing - by Dr. Chase Picket.  [name] History of renal artery stenosis     Left 95% stenosis s/p left renal artery stenting (09/2009), right renal artery with 50% stenosis.  . Hypertension   . History of CVA (cerebrovascular accident)     left frontal lobe stroke (11/2009), TIA  (06/2009, 04/2009) with multiple prior TIAs manifested by aphasia. H/o CVA in 1988 involving right eye.  . DM (diabetes mellitus), type 2, uncontrolled DX: 2001    Stopped of Metformin in 04/2011 in setting of AKI (Cr 3), now on insulin.  [name] History of breast cancer     Primary oncologist Dr. Brigitte Pulse. // First diagnosed 2000, recurrence in 06/2010. S/P left mastectomy with radiation therapy (2000), following recurrence, started chemothearpy, completed course (06/2010.)  . CKD (chronic kidney disease) stage 2, GFR 60-89 ml/min     BL Cr 1.6  . Hypothyroidism   . Hyperlipidemia   . DJD (degenerative joint disease) of knee     left  . Hx of adenomatous colonic polyps     remote history. Last colonoscopy (03/2004) - Normal colonoscopy to the cecum, rec 5 year follow-up colonoscopy.  [name] History of DVT of lower extremity 07/2003    in setting of saphenous vein grafting for CABG  . Dementia    Past Surgical History  Procedure Laterality Date  . Coronary artery bypass graft  07/2003  . Total knee arthroplasty  12/2004, 12/2005    BL, right (2006) by Dr. Berenice Primas. Left (2007) by Dr. Ronnie Derby.  . Simple mastectomy  2000    left, in setting of breast cancer  . Total abdominal hysterectomy    . Lumbar disc surgery     Family History  Problem Relation Age of Onset  . Heart disease Mother   . Diabetes Mother   . Heart  disease Father   . Cancer Father     colon cancer  . Heart disease Sister   . Heart disease Brother   . Cancer Brother     prostate cancer  . Heart disease Brother   . Cancer Sister     breast cancer   History  Substance Use Topics  . Smoking status: Never Smoker   . Smokeless tobacco: Never Used  . Alcohol Use: No  Used to smoke, quit 40 years ago Lives at home Lives with spouse  OB History   Grav Para Term Preterm Abortions TAB SAB Ect Mult Living                 Review of Systems  All other systems reviewed and are negative.     Allergies  Review of  patient's allergies indicates no known allergies.  Home Medications   Prior to Admission medications   Medication Sig Start Date End Date Taking? Authorizing Provider  allopurinol (ZYLOPRIM) 100 MG tablet Take 100 mg by mouth daily as needed (for gout).   Yes Historical Provider, MD  furosemide (LASIX) 20 MG tablet Take 20 mg by mouth daily as needed. Swelling.   Yes Historical Provider, MD  insulin glargine (LANTUS) 100 UNIT/ML injection Inject 80 Units into the skin at bedtime.   Yes Historical Provider, MD  levothyroxine (SYNTHROID, LEVOTHROID) 150 MCG tablet Take 150 mcg by mouth daily before breakfast.   Yes Historical Provider, MD  LORazepam (ATIVAN) 0.5 MG tablet Take 0.5 mg by mouth at bedtime as needed for sleep.    Yes Historical Provider, MD  metoprolol tartrate (LOPRESSOR) 25 MG tablet Take 25 mg by mouth 2 (two) times daily.   Yes Historical Provider, MD   BP 126/59  Pulse 53  Temp(Src) 97.8 F (36.6 C) (Oral)  Resp 12  Ht 5\' 2"  (1.575 m)  Wt 160 lb (72.576 kg)  BMI 29.26 kg/m2  SpO2 98%  Vital signs normal except bradycardia  Physical Exam  Nursing note and vitals reviewed. Constitutional: She is oriented to person, place, and time. She appears well-developed and well-nourished.  Non-toxic appearance. She does not appear ill. No distress.  HENT:  Head: Normocephalic and atraumatic.  Right Ear: External ear normal.  Left Ear: External ear normal.  Nose: Nose normal. No mucosal edema or rhinorrhea.  Mouth/Throat: Oropharynx is clear and moist and mucous membranes are normal. No dental abscesses or uvula swelling.  Eyes: Conjunctivae and EOM are normal. Pupils are equal, round, and reactive to light.  Neck: Normal range of motion and full passive range of motion without pain. Neck supple.  Cardiovascular: Normal rate, regular rhythm and normal heart sounds.  Exam reveals no gallop and no friction rub.   No murmur heard. Pulmonary/Chest: Effort normal and breath sounds  normal. No respiratory distress. She has no wheezes. She has no rhonchi. She has no rales. She exhibits no tenderness and no crepitus.    Area of pain noted  Abdominal: Soft. Normal appearance and bowel sounds are normal. She exhibits no distension. There is no tenderness. There is no rebound and no guarding.  Musculoskeletal: Normal range of motion. She exhibits no edema and no tenderness.  Moves all extremities well.   Neurological: She is alert and oriented to person, place, and time. She has normal strength. No cranial nerve deficit.  Skin: Skin is warm, dry and intact. No rash noted. No erythema. No pallor.  Psychiatric: She has a normal mood and affect. Her  speech is normal and behavior is normal. Her mood appears not anxious.    ED Course  Procedures (including critical care time) Medications  nitroGLYCERIN 0.2 mg/mL in dextrose 5 % infusion (0 mcg/min Intravenous Hold 06/04/14 1826)  heparin ADULT infusion 100 units/mL (25000 units/250 mL) (800 Units/hr Intravenous New Bag/Given 06/04/14 1902)  morphine 2 MG/ML injection 2 mg (not administered)  morphine 2 MG/ML injection 2 mg (2 mg Intravenous Given 06/04/14 1834)  heparin bolus via infusion 3,000 Units (0 Units Intravenous Stopped 06/04/14 1908)    Pt started on IV heparin and NTG as I saw her. The nurse informed me later that EMS reported her BP dropped with the NTG she was given by them. She was then given IV morphine for pain.   17:52 PA Dorene Ar will consult on patient.   18:26 PA [place] informed of + troponin  Pt was admitted by cardiology.     Labs Review Results for orders placed during the hospital encounter of 06/04/14  CBC WITH DIFFERENTIAL      Result Value Ref Range   WBC 8.5  4.0 - 10.5 K/uL   RBC 4.17  3.87 - 5.11 MIL/uL   Hemoglobin 13.0  12.0 - 15.0 g/dL   HCT 37.6  36.0 - 46.0 %   MCV 90.2  78.0 - 100.0 fL   MCH 31.2  26.0 - 34.0 pg   MCHC 34.6  30.0 - 36.0 g/dL   RDW 12.6  11.5 - 15.5 %   Platelets  239  150 - 400 K/uL   Neutrophils Relative % 76  43 - 77 %   Neutro Abs 6.5  1.7 - 7.7 K/uL   Lymphocytes Relative 17  12 - 46 %   Lymphs Abs 1.4  0.7 - 4.0 K/uL   Monocytes Relative 6  3 - 12 %   Monocytes Absolute 0.5  0.1 - 1.0 K/uL   Eosinophils Relative 1  0 - 5 %   Eosinophils Absolute 0.1  0.0 - 0.7 K/uL   Basophils Relative 0  0 - 1 %   Basophils Absolute 0.0  0.0 - 0.1 K/uL  COMPREHENSIVE METABOLIC PANEL      Result Value Ref Range   Sodium 139  137 - 147 mEq/L   Potassium 4.5  3.7 - 5.3 mEq/L   Chloride 101  96 - 112 mEq/L   CO2 23  19 - 32 mEq/L   Glucose, Bld 362 (*) 70 - 99 mg/dL   BUN 20  6 - 23 mg/dL   Creatinine, Ser 1.47 (*) 0.50 - 1.10 mg/dL   Calcium 9.1  8.4 - 10.5 mg/dL   Total Protein 6.8  6.0 - 8.3 g/dL   Albumin 3.1 (*) 3.5 - 5.2 g/dL   AST 43 (*) 0 - 37 U/L   ALT 20  0 - 35 U/L   Alkaline Phosphatase 100  39 - 117 U/L   Total Bilirubin 0.3  0.3 - 1.2 mg/dL   GFR calc non Af Amer 33 (*) >90 mL/min   GFR calc Af Amer 39 (*) >90 mL/min   Anion gap 15  5 - 15  PRO B NATRIURETIC PEPTIDE      Result Value Ref Range   Pro B Natriuretic peptide (BNP) 597.2 (*) 0 - 450 pg/mL  APTT      Result Value Ref Range   aPTT 28  24 - 37 seconds  PROTIME-INR      Result Value Ref Range  Prothrombin Time 13.8  11.6 - 15.2 seconds   INR 1.06  0.00 - 1.49  I-STAT TROPOININ, ED      Result Value Ref Range   Troponin i, poc 5.22 (*) 0.00 - 0.08 ng/mL   Comment NOTIFIED PHYSICIAN     Comment 3            Laboratory interpretation all normal except + troponin   Imaging Review Dg Chest Portable 1 View  06/04/2014   CLINICAL DATA:  Chest pain. Coronary artery disease. Breast carcinoma.  EXAM: PORTABLE CHEST - 1 VIEW  COMPARISON:  03/12/2012  FINDINGS: The heart size and mediastinal contours are within normal limits. Both lungs are clear. No evidence of pleural effusion. No mass or lymphadenopathy identified. Prior CABG again noted. Prior left mastectomy noted. The  visualized skeletal structures are unremarkable.  IMPRESSION: No active disease.   Electronically Signed   By: Earle Gell M.D.   On: 06/04/2014 18:17     EKG Interpretation   Date/Time:  Wednesday June 04 2014 17:20:01 EDT Ventricular Rate:  55 PR Interval:  175 QRS Duration: 120 QT Interval:  466 QTC Calculation: 446 R Axis:   -48 Text Interpretation:  Sinus rhythm LVH with IVCD, LAD and secondary repol  abnrm Since last tracing 12 Mar 2012 ST depression V1-V3, suggest  recording posterior leads Confirmed by Vaanya Shambaugh  MD-I, Karima Carrell ([id number]) on  06/04/2014 5:28:26 PM      MDM   Final diagnoses:  NSTEMI (non-ST elevated myocardial infarction)   Plan admission   Rolland Porter, MD, FACEP   CRITICAL CARE Performed by: Rolland Porter L Total critical care time: 31 min Critical care time was exclusive of separately billable procedures and treating other patients. Critical care was necessary to treat or prevent imminent or life-threatening deterioration. Critical care was time spent personally by me on the following activities: development of treatment plan with patient and/or surrogate as well as nursing, discussions with consultants, evaluation of patient's response to treatment, examination of patient, obtaining history from patient or surrogate, ordering and performing treatments and interventions, ordering and review of laboratory studies, ordering and review of radiographic studies, pulse oximetry and re-evaluation of patient's condition.    Janice Norrie, MD 06/04/14 1945

## 2014-06-04 NOTE — H&P (Addendum)
Clinical Summary Ms. Rhem is a 79 y.o.female hx of CAD with 3 vessel CABG in 07/2003, LVEF 50-55% by echo, renal artery stenosis with prior stenting, HTN, hx of CVA, DM, breast CA s/p left mastectomy/radiation/chemo, CKD, hyperlipidemia.   Admitted with chest pain. Started this morning at rest, 6/10 sharp pain in mid and left chest, + nausea and vomiting. No diaphoresis, no palpitations. No SOB. Reports over the last several weeks had had some generalized fatigue, DOE. She is unsure if this pain is similar to prior episodes. Pain lasted for several hours, better with NG. After 3 NG in ER bp dropped to 90s but has since normalized.   Last cath in our system Jan 2012:LVEF 40%, LM normal, LCX small patent vessel, LAD prox stent with 90% ISR, RCA prox 80%.  SVG- RCA patent, SVG-diag patent, LIMA-LAD patent.    ER vitals 126/59 p 53 Sat 98% INR 1.06, pro-BNP 597, trop 5.22, Cr pending, K pending, Hgb 13, Plt 239 CXR no acute process EKG SR, anteroseptal Q-waves, LAD with probable LAFB  with ST depressions inferior limb and lateral precordial leads fairly similar to prior EKG 2012-02-27 but more pronounced.     No Known Allergies  Medications Scheduled Medications: . nitroGLYCERIN  2-200 mcg/min Intravenous Once     Infusions:     PRN Medications:     Past Medical History  Diagnosis Date  . Chronic congestive heart failure     Mixed. 2D-echocardiogram (08/2010) - 35% to 40%. Diffuse hypokinesis. Grade 2 diastolic dysfunction.  . Coronary artery disease     s/p 3-vessel CABG (07/2003), s/p NSTEMI (06/2009), LHC (11/2010) - 40% EF with anterior and distal inferior wall motion abnormalities, likely secondary to Adriamycin. LAD stent in proximal region 90% occluded, better from prior 100% occlusion in February 27, 2007. RCA diffusely diseased with areas of 60, 70, 80% narrowing - by Dr. Chase Picket.  [name] History of renal artery stenosis     Left 95% stenosis s/p left renal artery stenting  (09/2009), right renal artery with 50% stenosis.  . Hypertension   . History of CVA (cerebrovascular accident)     left frontal lobe stroke (11/2009), TIA (06/2009, 04/2009) with multiple prior TIAs manifested by aphasia. H/o CVA in 02/27/87 involving right eye.  . DM (diabetes mellitus), type 2, uncontrolled DX: Feb 27, 2000    Stopped of Metformin in 04/2011 in setting of AKI (Cr 3), now on insulin.  [name] History of breast cancer     Primary oncologist Dr. Brigitte Pulse. // First diagnosed 02/27/99, recurrence in 06/2010. S/P left mastectomy with radiation therapy 1999-02-27), following recurrence, started chemothearpy, completed course (06/2010.)  . CKD (chronic kidney disease) stage 2, GFR 60-89 ml/min     BL Cr 1.6  . Hypothyroidism   . Hyperlipidemia   . DJD (degenerative joint disease) of knee     left  . Hx of adenomatous colonic polyps     remote history. Last colonoscopy (03/2004) - Normal colonoscopy to the cecum, rec 5 year follow-up colonoscopy.  [name] History of DVT of lower extremity 07/2003    in setting of saphenous vein grafting for CABG  . Dementia     Past Surgical History  Procedure Laterality Date  . Coronary artery bypass graft  07/2003  . Total knee arthroplasty  12/2004, 12/2005    BL, right 26-Feb-2005) by Dr. Berenice Primas. Left 2006-02-26) by Dr. Ronnie Derby.  . Simple mastectomy  1999-02-27    left, in setting of breast cancer  .  Total abdominal hysterectomy    . Lumbar disc surgery      Family History  Problem Relation Age of Onset  . Heart disease Mother   . Diabetes Mother   . Heart disease Father   . Cancer Father     colon cancer  . Heart disease Sister   . Heart disease Brother   . Cancer Brother     prostate cancer  . Heart disease Brother   . Cancer Sister     breast cancer    Social History Ms. Spadaccini reports that she has never smoked. She has never used smokeless tobacco. Ms. Couse reports that she does not drink alcohol.  Review of Systems CONSTITUTIONAL: No weight loss, fever,  chills, weakness or fatigue.  HEENT: Eyes: No visual loss, blurred vision, double vision or yellow sclerae. No hearing loss, sneezing, congestion, runny nose or sore throat.  SKIN: No rash or itching.  CARDIOVASCULAR: per HPI  RESPIRATORY: No shortness of breath, cough or sputum.  GASTROINTESTINAL: No anorexia, nausea, vomiting or diarrhea. No abdominal pain or blood.  GENITOURINARY: no polyuria, no dysuria NEUROLOGICAL: No headache, dizziness, syncope, paralysis, ataxia, numbness or tingling in the extremities. No change in bowel or bladder control.  MUSCULOSKELETAL: No muscle, back pain, joint pain or stiffness.  HEMATOLOGIC: No anemia, bleeding or bruising.  LYMPHATICS: No enlarged nodes. No history of splenectomy.  PSYCHIATRIC: No history of depression or anxiety.      Physical Examination Blood pressure 127/53, pulse 57, temperature 97.8 F (36.6 C), temperature source Oral, resp. rate 12, height 5\' 2"  (1.575 m), weight 160 lb (72.576 kg), SpO2 98.00%. No intake or output data in the 24 hours ending 06/04/14 1838  HEENT: sclera clear  Cardiovascular: RRR, 2/6 systolic murmur at apex, no JVD  Respiratory: CTAB  GI: abdomen soft, NT, ND  MSK: no LE edema  Neuro: no focal deficits  Psych: appropriate affect   Lab Results  Basic Metabolic Panel: No results found for this basename: NA, K, CL, CO2, GLUCOSE, BUN, CREATININE, CALCIUM, MG, PHOS,  in the last 168 hours  Liver Function Tests: No results found for this basename: AST, ALT, ALKPHOS, BILITOT, PROT, ALBUMIN,  in the last 168 hours  CBC:  Recent Labs Lab 06/04/14 1731  WBC 8.5  NEUTROABS 6.5  HGB 13.0  HCT 37.6  MCV 90.2  PLT 239    Cardiac Enzymes: No results found for this basename: CKTOTAL, CKMB, CKMBINDEX, TROPONINI,  in the last 168 hours  BNP: No components found with this basename: POCBNP,     Imaging Cath Jan 2012 RESULTS:  1. Central aortic pressure 137/61. Left ventricular pressure  139/7  with no aortic valve gradient at the time of pullback.  2. Ventriculography. Ventriculography in the RAO projection using 20  mL of contrast at 12 mL per second revealed the left ventricle to  be slightly dilated. The anterior wall and the distal inferior  wall were hypokinetic. The apex was underfilled but did have good  contractility. Her left ventricular end-diastolic pressure was 10  and her ejection fraction was calculated at 40%.  3. Coronary arteriography:  4. Left main normal and bifurcated.  5. Circumflex. This is a non grafted vessel that was small. It had  mild proximal and mid irregularities and gave rise to a very small  OMs.  6. LAD. There is a stent of proximal portion of the LAD. There is  eccentric 90% narrowing of the proximal portion of the stent with  brisk distal flow and retrograde flow up the internal mammary  artery. The distal LAD was small in diameter. In 2010, her cath  showed this stent to be 100% occluded. It has recanalized on its  own.  7. Right coronary artery. The proximal third of the right coronary  artery was diffusely diseased with 60, 70, and 80% areas of  narrowing. The distal right, the PDA, and posterolateral vessels  were free of disease and there was retrograde filling into the  saphenous vein graft to the RCA.  GRAFTS:  1. Saphenous vein graft to the distal RCA had proximal and ostial  irregularities with a valve in the proximal portion of the graft.  The remainder of the graft was free of disease. The distal right,  the PDA, and posterolateral vessels were all free of disease and  there was some frank right-to-left collaterals noted.  2. Saphenous vein graft to the diagonal. The vein graft was patent.  The diagonal was small but free of disease.  3. Internal mammary artery to the LAD. The LAD was widely patent. It  inserted into the mid LAD just distal to the stent. The mid and  distal LAD was small but no high-grade stenoses.    CONCLUSION:  1. 40% ejection fraction with anterior and distal inferior wall motion  abnormalities. In 2010, her ejection fraction was normal and I  assume this is Adriamycin induced.  2. Patent grafts.  3. Progression of diagonal and LAD disease, predominantly small vessel  disease consistent with her diabetes.  Despite the high-grade stenosis in the proximal portion of the LAD that  is within a stent. She has excellent flow down her internal mammary  artery. The stent had been closed previously and I do not think that  attempts of opening this stent up would make any change in her symptoms  and may in fact damage her internal mammary artery graft.  She will be discharged to home today. I will follow her up in the  office in about 10 days.  02/2012 Echo Study Conclusions  - Left ventricle: The cavity size was normal. There was mild focal basal hypertrophy of the septum. Systolic function was normal. The estimated ejection fraction was in the range of 50% to 55%. There may be mild basal to mid septal hypokinesis to dyskinesis. The basal septum appears bright, but not thinned. Doppler parameters are consistent with abnormal left ventricular relaxation (grade 1 diastolic dysfunction). The E/e' ratio is >10, suggesting elevated LV filling pressure. - Aortic valve: Sclerosis without stenosis. - Mitral valve: Trivial regurgitation. - Left atrium: The atrium was normal in size. - Inferior vena cava: The vessel was normal in size; the respirophasic diameter changes were in the normal range (= 50%); findings are consistent with normal central venous pressure.   Impression/Recommendations 1. NSTEMI - patient currently pain free, will admit to stepdown given her high risk, positive troponins and potential need for NG gtt if pain recurs.  - hep gtt, beta blocker with hold parameters given borderline low HR, ASA, high dose statin. Hold ACE-I given borderline renal function.  - will give  gentle IVF overnight in anticipation for cath.  - will cut her night time lantus in half tonight since she is NPO.   Carlyle Dolly, M.D., F.A.C.C.

## 2014-06-05 ENCOUNTER — Encounter (HOSPITAL_COMMUNITY)
Admission: EM | Disposition: A | Discharge status: Home / Self Care | Payer: Medicare Other | Source: Home / Self Care | Attending: Cardiology

## 2014-06-05 ENCOUNTER — Encounter (HOSPITAL_COMMUNITY): Payer: Self-pay | Admitting: Cardiology

## 2014-06-05 DIAGNOSIS — I6350 Cerebral infarction due to unspecified occlusion or stenosis of unspecified cerebral artery: Secondary | ICD-10-CM

## 2014-06-05 DIAGNOSIS — I2510 Atherosclerotic heart disease of native coronary artery without angina pectoris: Secondary | ICD-10-CM

## 2014-06-05 DIAGNOSIS — I214 Non-ST elevation (NSTEMI) myocardial infarction: Principal | ICD-10-CM

## 2014-06-05 DIAGNOSIS — E118 Type 2 diabetes mellitus with unspecified complications: Secondary | ICD-10-CM

## 2014-06-05 DIAGNOSIS — I25810 Atherosclerosis of coronary artery bypass graft(s) without angina pectoris: Secondary | ICD-10-CM

## 2014-06-05 DIAGNOSIS — Z9861 Coronary angioplasty status: Secondary | ICD-10-CM

## 2014-06-05 DIAGNOSIS — E785 Hyperlipidemia, unspecified: Secondary | ICD-10-CM

## 2014-06-05 DIAGNOSIS — IMO0002 Reserved for concepts with insufficient information to code with codable children: Secondary | ICD-10-CM

## 2014-06-05 DIAGNOSIS — E1165 Type 2 diabetes mellitus with hyperglycemia: Secondary | ICD-10-CM

## 2014-06-05 HISTORY — PX: LEFT HEART CATHETERIZATION WITH CORONARY/GRAFT ANGIOGRAM: SHX5450

## 2014-06-05 HISTORY — DX: Atherosclerotic heart disease of native coronary artery without angina pectoris: I25.10

## 2014-06-05 LAB — BASIC METABOLIC PANEL
ANION GAP: 15 (ref 5–15)
BUN: 17 mg/dL (ref 6–23)
CHLORIDE: 106 meq/L (ref 96–112)
CO2: 19 mEq/L (ref 19–32)
Calcium: 6.9 mg/dL — ABNORMAL LOW (ref 8.4–10.5)
Creatinine, Ser: 1.11 mg/dL — ABNORMAL HIGH (ref 0.50–1.10)
GFR calc Af Amer: 54 mL/min — ABNORMAL LOW (ref >90)
GFR, EST NON AFRICAN AMERICAN: 47 mL/min — AB (ref >90)
Glucose, Bld: 272 mg/dL — ABNORMAL HIGH (ref 70–99)
Potassium: 3.3 mEq/L — ABNORMAL LOW (ref 3.7–5.3)
Sodium: 140 mEq/L (ref 137–147)

## 2014-06-05 LAB — PROTIME-INR
INR: 2.02 — ABNORMAL HIGH (ref 0.00–1.49)
INR: 1.06 (ref 0.00–1.49)
Prothrombin Time: 22.9 seconds — ABNORMAL HIGH (ref 11.6–15.2)
Prothrombin Time: 13.8 seconds (ref 11.6–15.2)

## 2014-06-05 LAB — CBC
HCT: 32.5 % — ABNORMAL LOW (ref 36.0–46.0)
HEMOGLOBIN: 11 g/dL — AB (ref 12.0–15.0)
MCH: 30.6 pg (ref 26.0–34.0)
MCHC: 33.8 g/dL (ref 30.0–36.0)
MCV: 90.5 fL (ref 78.0–100.0)
Platelets: 196 10*3/uL (ref 150–400)
RBC: 3.59 MIL/uL — AB (ref 3.87–5.11)
RDW: 12.7 % (ref 11.5–15.5)
WBC: 9.3 10*3/uL (ref 4.0–10.5)

## 2014-06-05 LAB — POCT ACTIVATED CLOTTING TIME: Activated Clotting Time: 360 seconds

## 2014-06-05 LAB — TROPONIN I

## 2014-06-05 LAB — GLUCOSE, CAPILLARY
GLUCOSE-CAPILLARY: 298 mg/dL — AB (ref 70–99)
GLUCOSE-CAPILLARY: 160 mg/dL — AB (ref 70–99)
Glucose-Capillary: 157 mg/dL — ABNORMAL HIGH (ref 70–99)
Glucose-Capillary: 136 mg/dL — ABNORMAL HIGH (ref 70–99)

## 2014-06-05 LAB — HEPARIN LEVEL (UNFRACTIONATED): Heparin Unfractionated: 0.5 IU/mL (ref 0.30–0.70)

## 2014-06-05 LAB — CALCIUM: Calcium: 8.2 mg/dL — ABNORMAL LOW (ref 8.4–10.5)

## 2014-06-05 LAB — PHOSPHORUS: Phosphorus: 3.2 mg/dL (ref 2.3–4.6)

## 2014-06-05 SURGERY — LEFT HEART CATHETERIZATION WITH CORONARY/GRAFT ANGIOGRAM
Anesthesia: LOCAL

## 2014-06-05 MED ORDER — TICAGRELOR 90 MG PO TABS
ORAL_TABLET | ORAL | Status: AC
  Filled 2014-06-05: qty 2

## 2014-06-05 MED ORDER — BIVALIRUDIN 250 MG IV SOLR
INTRAVENOUS | Status: AC
  Filled 2014-06-05: qty 250

## 2014-06-05 MED ORDER — MIDAZOLAM HCL 2 MG/2ML IJ SOLN
INTRAMUSCULAR | Status: AC
  Filled 2014-06-05: qty 2

## 2014-06-05 MED ORDER — TICAGRELOR 90 MG PO TABS
90.0000 mg | ORAL_TABLET | Freq: Two times a day (BID) | ORAL | Status: DC
  Administered 2014-06-06 – 2014-06-08 (×6): 90 mg via ORAL
  Filled 2014-06-05 (×8): qty 1

## 2014-06-05 MED ORDER — HEPARIN (PORCINE) IN NACL 2-0.9 UNIT/ML-% IJ SOLN
INTRAMUSCULAR | Status: AC
  Filled 2014-06-05: qty 1000

## 2014-06-05 MED ORDER — NITROGLYCERIN IN D5W 200-5 MCG/ML-% IV SOLN
2.0000 ug/min | INTRAVENOUS | Status: DC
  Administered 2014-06-05: 33.333 ug/min via INTRAVENOUS
  Administered 2014-06-05: 07:00:00 15 ug/min via INTRAVENOUS

## 2014-06-05 MED ORDER — FENTANYL CITRATE 0.05 MG/ML IJ SOLN
INTRAMUSCULAR | Status: AC
  Filled 2014-06-05: qty 2

## 2014-06-05 MED ORDER — POTASSIUM CHLORIDE CRYS ER 20 MEQ PO TBCR
40.0000 meq | EXTENDED_RELEASE_TABLET | Freq: Once | ORAL | Status: AC
  Administered 2014-06-05: 08:00:00 40 meq via ORAL
  Filled 2014-06-05: qty 2

## 2014-06-05 MED ORDER — VERAPAMIL HCL 2.5 MG/ML IV SOLN
INTRAVENOUS | Status: AC
  Filled 2014-06-05: qty 2

## 2014-06-05 MED ORDER — PROMETHAZINE HCL 25 MG/ML IJ SOLN
12.5000 mg | Freq: Three times a day (TID) | INTRAMUSCULAR | Status: DC | PRN
  Administered 2014-06-05: 20:00:00 12.5 mg via INTRAVENOUS
  Filled 2014-06-05: qty 1

## 2014-06-05 MED ORDER — NALOXONE HCL 0.4 MG/ML IJ SOLN
INTRAMUSCULAR | Status: AC
  Filled 2014-06-05: qty 1

## 2014-06-05 MED ORDER — NITROGLYCERIN 1 MG/10 ML FOR IR/CATH LAB
INTRA_ARTERIAL | Status: AC
  Filled 2014-06-05: qty 10

## 2014-06-05 MED ORDER — MAGNESIUM SULFATE 50 % IJ SOLN
INTRAMUSCULAR | Status: AC
  Filled 2014-06-05: qty 2

## 2014-06-05 MED ORDER — SODIUM CHLORIDE 0.9 % IV SOLN
INTRAVENOUS | Status: DC
  Administered 2014-06-05: 08:00:00 via INTRAVENOUS

## 2014-06-05 MED ORDER — LIDOCAINE HCL (PF) 1 % IJ SOLN
INTRAMUSCULAR | Status: AC
  Filled 2014-06-05: qty 30

## 2014-06-05 MED ORDER — HEPARIN SODIUM (PORCINE) 1000 UNIT/ML IJ SOLN
INTRAMUSCULAR | Status: AC
  Filled 2014-06-05: qty 1

## 2014-06-05 MED ORDER — CYCLOBENZAPRINE HCL 5 MG PO TABS
5.0000 mg | ORAL_TABLET | Freq: Three times a day (TID) | ORAL | Status: DC | PRN
  Filled 2014-06-05: qty 1

## 2014-06-05 MED ORDER — LORAZEPAM 0.5 MG PO TABS: 0.5000 mg | ORAL_TABLET | Freq: Every evening | ORAL | Status: DC | PRN

## 2014-06-05 NOTE — CV Procedure (Signed)
CARDIAC CATHETERIZATION AND PERCUTANEOUS CORONARY INTERVENTION REPORT  NAME:  [name]   MRN: [id number] DOB:  [birth year]   ADMIT DATE: 06/04/2014 Procedure Date: 06/05/2014  INTERVENTIONAL CARDIOLOGIST: Leonie Man, M.D., MS PRIMARY CARE PROVIDER: Leonides Sake, MD PRIMARY CARDIOLOGIST: Previously Dr. Chase Picket (Family has requested Dr. Ellyn Hack)  PATIENT:  [name] is a 79 y.o. female hx of CAD with 3 vessel CABG in 07/2003, LVEF 50-55% by echo, renal artery stenosis with prior stenting, HTN, hx of CVA, DM, breast CA s/p left mastectomy/radiation/chemo, CKD, hyperlipidemia.  Admitted with chest pain. Started this morning at rest, 6/10 sharp pain in mid and left chest, + nausea and vomiting. No diaphoresis, no palpitations. No SOB. Reports over the last several weeks had had some generalized fatigue, DOE. She is unsure if this pain is similar to prior episodes. Pain lasted for several hours, better with NG. After 3 NG in ER bp dropped to 90s but has since normalized.  Last cath in our system Jan 2012:LVEF 40%, LM normal, LCX small patent vessel, LAD prox stent with 90% ISR, RCA prox 80%. SVG- RCA patent, SVG-diag patent, LIMA-LAD patent.  She ruled in for NSTEMI with Troponin of > 20.  She has had intermittent chest pain today.  Referred for Cardiac Cath +/- PCI.   PRE-OPERATIVE DIAGNOSIS:    NSTEMI  CAD - prior CABG (LIMA-LAD, SVG-D1, SVG-dRCA) after LAD & D1 stent stenosed   PROCEDURES PERFORMED:    Left Heart Catheterization with Native Coroanry and Graft Angiography via Right Common Femoral Artery access  Percutaneous Coronary Intervention of Long ~25 mm ~70-90% lesion in prox Circumflex - Xience Alpine DES 2.25 mm x 28 mm (2.55 mm)   Complex 2 site Percutaneous Coronary Intervention of SVG-D1 - ~ostial SVG 80% eccentric lesion & anastomotic ~90% stenosis.  PROCEDURE: The patient was brought to the 2nd [facility] in the fasting  state and prepped and draped in the usual sterile fashion for Left Radial & Right Common Femoral artery access. A modified Allen's test was performed on the Left wrist demonstrating excellent collateral flow for radial access.   Sterile technique was used including antiseptics, cap, gloves, gown, hand hygiene, mask and sheet. Skin prep: Chlorhexidine.   Consent: Risks of procedure as well as the alternatives and risks of each were explained to the (patient/caregiver). Consent for procedure obtained.   Time Out: Verified patient identification, verified procedure, site/side was marked, verified correct patient position, special equipment/implants available, medications/allergies/relevent history reviewed, required imaging and test results available. Performed.  Access:   Left Radial Artery: 6 Fr Sheath -  Seldinger Technique (Angiocath Micropuncture Kit)  Radial Cocktail - 10 mL  Unable to pass JR4 catheter over Versicore wire beyond elbow -- Aborted Procedure   Right Common Femoral Artery: 5 Fr Sheath - fluoroscopically guided modified Seldinger Technique.  Left Heart Catheterization: 5Fr Catheters advanced or exchanged over a standard J-wire; JL4 catheter advanced first.  Left Coronary Artery Cineangiography: JL4 Catheter  Right Coronary Artery, SVG-RCA & SVG-OM1-2 Cineangiography: JR4 Catheter  LIMA-LAD Cineangiography: JR4 Catheter redirected into Left Subclavian Artery & exchanged over long-exchange wire for IMA catheter.  LV Hemodynamics (LV Gram): Angled Pigtail  Femoral Sheath remove2d in the [place] Post-Procedure Unit with manual pressure for hemostasis.  TR Band: 1530  Hours; 15 mL air -- was monitored during the procedure; decreased to 12 ml air prior to completion.  FINDINGS:  Hemodynamics:   Central Aortic Pressure / Mean: 147/69/100  mmHg  Left Ventricular Pressure / LVEDP: 148/6/60 mmHg  Left Ventriculography:  EF: 40-45 %  Wall Motion: Global hypokinesis with apparent  lateral/inferolateral hypokinesis  Coronary Anatomy:  Dominance: Right  Left Main: Normal caliber vessel that bifurcates into the LAD and Circumflex. Mildly calcified. Distal 40% stenosis. LAD: Normal caliber vessel that is essentially occluded in the proximal portion of a long-stented segment. The stent extends into the first diagonal branch was also occluded. The only branch of the LAD there is perfused antegrade is a proximal septal perforator.  LIMA-LAD: Widely patent graft the mid LAD just be on the stent. Brisk flow antegrade with minimal irregularities downstream. There is not significant antegrade flow up the LAD into the occluded stent.  SVG to D1: Large caliber stent to a smaller moderate caliber diagonal branch. There is proximal 80% eccentric stenosis and a 90% anastomotic lesion involving the proximal portion of the rapid diagonal branch. This appears to be the culprit lesion.  Left Circumflex: Moderate caliber, non-grafted native vessel that essentially courses as a bifurcating obtuse marginal branch. There is a long segment of 70-90% stenosis (focally in 2 spots 90%) followed by relatively normal vessel as it bifurcates.   RCA: Large caliber vessel that is proximally occluded.    SVG-distal RCA: Large caliber, widely patent graft to the distal RCA. Antegrade flow perfuses the PDA and posterolateral system. There is a 90% ostial lesion of the small caliber (roughly 1.5 mm) PDA, but the PL system is free of disease.  After reviewing the initial angiography, the culprit lesion was thought to be tandem lesions in the SVG-diagonal, however the lesion in the native circumflex was also thought to be significant..  Preparation were made to proceed with PCI on these lesions with the Circumflex being done first.  Percutaneous Coronary Intervention:  Sheath exchanged for 6 Fr  Lesion #1: Proximal circumflex 70-90% long lesion reduced to 0%. TIMI-3 flow pre-and post Guide: 6 Fr   XB  3.5 Guidewire: Pro-water Predilation Balloon: Emerge 2.0 mm x 20 mm;   8 Atm x 30 Sec, 1 Stent: Xience Alpine 2.25 mm x 28 mm;   Max inflation: 18 Atm x 35 Sec  Post-dilation Balloon: [place] Euphora 2.5 mm x 20 mm;   18 Atm x 30 Sec x 2  Final Diameter: ~2.6 mm  Post deployment angiography in multiple views, with and without guidewire in place revealed excellent stent deployment and lesion coverage.  There was no evidence of dissection or perforation.  Lesion #2: Anastomotic SVG-D1 90% involving the branch; reduced to roughly 20% with TIMI-3 flow pre-and post Guide: 6 Fr   AL-1 Guidewire: Pro-water, followed by luge Predilation Balloon: Emerge 2.0 mm x 20 mm;   6 Atm x 30 Sec  X 3 inflations  At this point attention was turned to lesion #3, following stenting of lesion #3 the decision was made to use a scoring balloon to fully treat this lesion Scoring balloon: [employer]etics Flextome 2.5 mm x 15 mm;   Max inflation: 10 Atm x 45 Sec -- notable improvement of apparent fibrous ring inflation reducing 90% stenosis to less than 20%  Lesion #3: Ostial/Proximal SVG-D1 80% eccentric used a 0%. TIMI-3 flow pre-and post  Stent # 1: Xience Alpine 4.0 mm x 15 mm;   Max inflation: 16 Atm x 35 Sec   Following stent placement, a focal distal edge dissection was noted, so a second stent was placed. Stent # 2: Xience Alpine 4.0 mm x 12 mm;  Max inflation: 16 Atm x 35 Sec; inflated to 18 Atm x 30 Sec @ overlap site  Post deployment angiography in multiple views, with and without guidewire in place revealed excellent stent deployment and lesion coverage.  There was no evidence of dissection or perforation.  The flow in the target vessel was brisk.  MEDICATIONS:  Anesthesia:  Local Lidocaine 2 mL - L wrist, 14 mL R Groin  Sedation:  4 mg IV Versed, 100 mcg IV fentanyl ;   Omnipaque Contrast: 360 ml  Anticoagulation:  Angiomax Bolus & drip  Anti-Platelet Agent:  Brilinta 80 mg  PATIENT  DISPOSITION:    The patient was transferred to the PACU holding area in a hemodynamicaly stable, chest pain free condition.  The patient tolerated the procedure well, and there were no complications.  EBL:   < 20 ml  The patient was stable before, during, and after the procedure.  POST-OPERATIVE DIAGNOSIS:    Severe disease of the native LAD and RCA both are 100% occluded,  long proximal circumflex 70-90% stenosis, and ostial RPDA 90%.  Severe lesions in the SVG-D1 with 80% proximal and 90% anastomotic lesions.  Successful DES PCI of the proximal circumflex and proximal SVG-D1 with PTCA of the anastomotic lesion of SVG-D1  Widely patent LIMA-LAD and SVG-dRCA  Mild to moderately reduced EF with elevated LVEDP.  PLAN OF CARE:  The patient will be transferred to the [facility] post procedure unit for TR band and femoral sheath removal  Dual antiplatelet therapy for a minimum of one year.  With moderately reduced LVEF and significant for an elevation, would not discharge post procedure day 1.  Continue aggressive risk factor management.   Leonie Man, M.D., M.S. [facility] [address] [place], [place]  [address]  [phone]  06/05/2014 5:56 PM

## 2014-06-05 NOTE — Consult Note (Signed)
ANTICOAGULATION CONSULT NOTE - Initial Consult  Pharmacy Consult for Heparin Indication: chest pain/ACS  No Known Allergies  Patient Measurements: Height: 5\' 2"  (157.5 cm) Weight: 149 lb 0.5 oz (67.6 kg) IBW/kg (Calculated) : 50.1 Heparin Dosing Weight: 65.5kg  Vital Signs: Temp: 98.1 F (36.7 C) (07/16 0430) Temp src: Oral (07/16 0430) BP: 135/67 mmHg (07/16 0430) Pulse Rate: 81 (07/16 0430)  Labs:  Recent Labs  06/04/14 1731 06/04/14 2018 06/05/14 0200 06/05/14 0406  HGB 13.0  --  11.0*  --   HCT 37.6  --  32.5*  --   PLT 239  --  196  --   APTT 28  --   --   --   LABPROT 13.8  --  22.9*  --   INR 1.06  --  2.02*  --   HEPARINUNFRC  --   --  5.76* 0.50  CREATININE 1.47*  --  1.11*  --   TROPONINI  --  >20.00* >20.00*  --     Estimated Creatinine Clearance: 38.9 ml/min (by C-G formula based on Cr of 1.11).   Medical History: Past Medical History  Diagnosis Date  . Chronic congestive heart failure     Mixed. 2D-echocardiogram (08/2010) - 35% to 40%. Diffuse hypokinesis. Grade 2 diastolic dysfunction.  . Coronary artery disease     s/p 3-vessel CABG (07/2003), s/p NSTEMI (06/2009), LHC (11/2010) - 40% EF with anterior and distal inferior wall motion abnormalities, likely secondary to Adriamycin. LAD stent in proximal region 90% occluded, better from prior 100% occlusion in 2008. RCA diffusely diseased with areas of 60, 70, 80% narrowing - by Dr. Chase Picket.  [name] History of renal artery stenosis     Left 95% stenosis s/p left renal artery stenting (09/2009), right renal artery with 50% stenosis.  . Hypertension   . History of CVA (cerebrovascular accident)     left frontal lobe stroke (11/2009), TIA (06/2009, 04/2009) with multiple prior TIAs manifested by aphasia. H/o CVA in 1988 involving right eye.  . DM (diabetes mellitus), type 2, uncontrolled DX: 2001    Stopped of Metformin in 04/2011 in setting of AKI (Cr 3), now on insulin.  [name] History of breast cancer      Primary oncologist Dr. Brigitte Pulse. // First diagnosed 2000, recurrence in 06/2010. S/P left mastectomy with radiation therapy (2000), following recurrence, started chemothearpy, completed course (06/2010.)  . CKD (chronic kidney disease) stage 2, GFR 60-89 ml/min     BL Cr 1.6  . Hypothyroidism   . Hyperlipidemia   . DJD (degenerative joint disease) of knee     left  . Hx of adenomatous colonic polyps     remote history. Last colonoscopy (03/2004) - Normal colonoscopy to the cecum, rec 5 year follow-up colonoscopy.  [name] History of DVT of lower extremity 07/2003    in setting of saphenous vein grafting for CABG  . Dementia     Medications:  No anticoagulants pta  Assessment: 78yof with hx CABG presents to the ED with chest pain. First troponin is positive at 5. Heparin level at 0334 resulted as 5.76 IU. Call to lab and nurse revealed it was drawn incorrectly from the arm where heparin was actively infusing. INR/PT is also innaccurate. RN and Lab stated policy of not drawing from Arm adjacent to mastectomy citing increase chance of lymphedema. However a search of literature suggests this is anecdotal at best. The mastectomy was remote --15 years ago. Increase chance of lyphedema at this stage is  no more than baseline. However a foot stick order was obtained and the repeat HL is 0.50. With no bleeding or further complications.  Goal of Therapy:  Heparin level 0.3-0.7 units/ml Monitor platelets by anticoagulation protocol: Yes   Plan:  Continue heparin at 800 units/hr and f/u am of 7/17 to limit foot sticks.   06/05/2014,6:40 AM

## 2014-06-05 NOTE — H&P (View-Only) (Signed)
Subjective: Mild chest discomfort/tightness.  Slept well.   Objective: Vital signs in last 24 hours: Temp:  [97.8 F (36.6 C)-98.1 F (36.7 C)] 98.1 F (36.7 C) (07/16 0430) Pulse Rate:  [53-81] 81 (07/16 0430) Resp:  [12-20] 20 (07/16 0430) BP: (94-161)/(49-95) 135/67 mmHg (07/16 0430) SpO2:  [95 %-100 %] 99 % (07/16 0430) Weight:  [149 lb 0.5 oz (67.6 kg)-160 lb (72.576 kg)] 149 lb 0.5 oz (67.6 kg) (07/16 0026)    Intake/Output from previous day: 07/15 0701 - 07/16 0700 In: -  Out: 900 [Urine:900] Intake/Output this shift:    Medications Current Facility-Administered Medications  Medication Dose Route Frequency Provider Last Rate Last Dose  . 0.9 %  sodium chloride infusion   Intravenous Continuous Arnoldo Lenis, MD 50 mL/hr at 06/04/14 1956    . 0.9 %  sodium chloride infusion  250 mL Intravenous PRN Cecilie Kicks, NP      . 0.9 %  sodium chloride infusion  1 mL/kg/hr Intravenous Continuous Cecilie Kicks, NP 67.6 mL/hr at 06/05/14 0436 1 mL/kg/hr at 06/05/14 0436  . acetaminophen (TYLENOL) tablet 650 mg  650 mg Oral Q4H PRN Arnoldo Lenis, MD      . Derrill Memo ON 06/06/2014] aspirin EC tablet 81 mg  81 mg Oral Daily Arnoldo Lenis, MD      . atorvastatin (LIPITOR) tablet 80 mg  80 mg Oral q1800 Arnoldo Lenis, MD   80 mg at 06/04/14 2149  . heparin ADULT infusion 100 units/mL (25000 units/250 mL)  800 Units/hr Intravenous Continuous Deboraha Sprang, RPH 8 mL/hr at 06/05/14 0500 800 Units/hr at 06/05/14 0500  . insulin aspart (novoLOG) injection 0-5 Units  0-5 Units Subcutaneous QHS Cecilie Kicks, NP   5 Units at 06/04/14 2113  . insulin aspart (novoLOG) injection 0-9 Units  0-9 Units Subcutaneous TID WC Cecilie Kicks, NP      . insulin glargine (LANTUS) injection 40 Units  40 Units Subcutaneous QHS Arnoldo Lenis, MD   40 Units at 06/04/14 2148  . metoprolol tartrate (LOPRESSOR) tablet 12.5 mg  12.5 mg Oral BID Arnoldo Lenis, MD      . morphine 2 MG/ML  injection 2 mg  2 mg Intravenous Q4H PRN Arnoldo Lenis, MD   2 mg at 06/04/14 2234  . nitroGLYCERIN (NITROSTAT) SL tablet 0.4 mg  0.4 mg Sublingual Q5 Min x 3 PRN Arnoldo Lenis, MD      . ondansetron [facility]) injection 4 mg  4 mg Intravenous Q6H PRN Arnoldo Lenis, MD      . sodium chloride 0.9 % injection 3 mL  3 mL Intravenous Q12H Cecilie Kicks, NP      . sodium chloride 0.9 % injection 3 mL  3 mL Intravenous PRN Cecilie Kicks, NP        PE: General appearance: alert, cooperative and no distress Lungs: clear to auscultation bilaterally Heart: regular rate and rhythm, S1, S2 normal, no murmur, click, rub or gallop Extremities: No LEE Pulses: 2+ and symmetric Skin: Warm and dry Neurologic: Grossly normal  Lab Results:   Recent Labs  06/04/14 1731 06/05/14 0200  WBC 8.5 9.3  HGB 13.0 11.0*  HCT 37.6 32.5*  PLT 239 196   BMET  Recent Labs  06/04/14 1731 06/05/14 0200  NA 139 140  K 4.5 3.3*  CL 101 106  CO2 23 19  GLUCOSE 362* 272*  BUN 20 17  CREATININE 1.47* 1.11*  CALCIUM 9.1  6.9*   PT/INR  Recent Labs  06/04/14 1731 06/05/14 0200  LABPROT 13.8 22.9*  INR 1.06 2.02*   Cardiac Panel (last 3 results)  Recent Labs  06/04/14 2018 06/05/14 0200  TROPONINI >20.00* >20.00*      Assessment/Plan [name] is a 79 y.o.female hx of CAD with 3 vessel CABG in 07/2003, LVEF 50-55% by echo, renal artery stenosis with prior stenting, HTN, hx of CVA, DM, breast CA s/p left mastectomy/radiation/chemo, CKD, hyperlipidemia.  Admitted with chest pain. Started this morning at rest, 6/10 sharp pain in mid and left chest, + nausea and vomiting. No diaphoresis, no palpitations. No SOB. Reports over the last several weeks had had some generalized fatigue, DOE. She is unsure if this pain is similar to prior episodes. Pain lasted for several hours, better with NG. After 3 NG in ER bp dropped to 90s but has since normalized.   Last cath in our system Jan 2012:LVEF 40%,  LM normal, LCX small patent vessel, LAD prox stent with 90% ISR, RCA prox 80%. SVG- RCA patent, SVG-diag patent, LIMA-LAD patent.   Principal Problem:   NSTEMI (non-ST elevated myocardial infarction) Active Problems:   Chronic systolic congestive heart failure   CAD (coronary artery disease) of artery bypass graft   Hypertension   DM (diabetes mellitus), type 2, uncontrolled with complications   Chronic kidney disease (CKD), stage II (mild)   Hypothyroidism   Hyperlipidemia   History of DVT (deep vein thrombosis)   History of breast cancer   Hypokalemia  Plan:   Troponin > 20.00.  On IV heparin and NTG.  SCr improved to 1.11.  Left heart cath today at 1330hrs, however, her INR is 2.02 this morning but it was drawn from heparin line.  Redraw this morning.  Replace K+.  BP and HR stable this morning.  ASA, lipitor, lopressor 12.5.   DM:  Lantus(only half given last night), SS insulin.    LOS: 1 day    HAGER, BRYAN PA-C 06/05/2014 7:08 AM   Patient seen and examined. Agree with assessment and plan.79 yo WF who is 13 yrs s/p CABG. She has a h/o multiple TIA's and subsequent CVA 201. She presented yesterday with chest pain. ECG yesterday showed inferolateral ST changes. Pt has had recurrent chest tightness. Trop + >20 this am. BP ~ 100. Will increase fluids, IV NTG titration as BP allows, IV heparin; f/u ECG and plan cath this am. Discussed in detail with patient who understands. Her memory is poor and she cannot recall specifics of her past history. K replete with K 3.3. Re check Ca with Phos and INR (not on coumadin with ?2.02 on lab drawn earlier). F/U ECG just done shows ILBBB with progressive STT changes c/w NSTEMI.   Troy Sine, MD, [facility] 06/05/2014 8:15 AM

## 2014-06-05 NOTE — Progress Notes (Signed)
CRITICAL VALUE ALERT  Critical value received:  Troponin > 20  Date of notification:  06/04/2014  Time of notification:  21:15 pm  Critical value read back:Yes.    Nurse who received alert:  Maxwell Marion RN  MD notified (1st page):  Dr. Fransico Him  Time of first page:  21:20  MD notified (2nd page):  Time of second page:  Responding MD: Dr. Radford Pax  Time MD responded 21:30

## 2014-06-05 NOTE — Care Management Note (Addendum)
    Page 1 of 1   06/06/2014     10:49:02 AM CARE MANAGEMENT NOTE 06/06/2014  Patient:  SKYLEN, DANIELSEN   Account Number:  [id number]  Date Initiated:  06/05/2014  Documentation initiated by:  [facility]  Subjective/Objective Assessment:   79 y.o.female hx of CAD with 3 vessel CABG , LVEF 50-55%, renal artery stenosis with prior stenting, HTN, hx of CVA, DM, breast CA s/p left mastectomy/radiation/chemo, CKD, hyperlipidemia. Admitted with chest pain.//Home with spouse.     Action/Plan:   admit to stepdown; NG gtt; hep gtt; beta blocker; gentle IVF; cath.// Access for [facility] services and benefits check for Brilinta   Anticipated DC Date:  06/06/2014   Anticipated DC Plan:  [place]  CM consult  Medication Assistance      Choice offered to / List presented to:             Status of service:  Completed, signed off Medicare Important Message given?  NA - LOS <3 / Initial given by admissions (If response is "NO", the following Medicare IM given date fields will be blank) Date Medicare IM given:   Medicare IM given by:   Date Additional Medicare IM given:   Additional Medicare IM given by:    Discharge Disposition:    Per UR Regulation:  Reviewed for med. necessity/level of care/duration of stay  If discussed at [place] of Stay Meetings, dates discussed:    Comments:  06/06/14 [place], RN, BSN, [employer] [phone] Spoke with pt at bedside regarding benefits check for Brilinta.  Pt has brochure with 30 day free card and refill assistance card intact.  Pt utilizes [facility] in [place] for prescription needs.  NCM called pharmacy to confirm availability of medication.  Information relayed to pt.  Pt verbalizes importance of filling medication upon discharge.

## 2014-06-05 NOTE — Brief Op Note (Signed)
   Brief Cardiac Catheterization Note:  NAME:  [name]   MRN: [id number] DOB:  [birth year]   ADMIT DATE: 06/04/2014  Indication: 1. NSTEMI 2. CAD - prior CABG (LIMA-LAD, SVG-D1, SVG-dRCA  Procedures: 1. Left Heart Catheterization with Native Coroanry and Graft Angiography via Right Common Femoral Artery access (unable to advance catheter beyond elbow from Left Radial  TR Band on L wrist ~1530  RFA 5 Sheath -- JL4 (LCA), JR4 (RCA, SVG-RCA & SVG-D1), Angled Pigail (LV), IMA (LIMA) 2. Percutaneous Coronary Intervention of Long ~25 mm ~70-90% lesion in prox Circumflex - Xience Alpine DES 2.25 mm x 28 mm (2.55 mm)   Exchange for 6 Fr sheath  XB 3.5 Guide - prowater - Euphora 2.0 mm x 20 mm; Xience Alpine DES 3. Complex 2 site Percutaneous Coronary Intervention of SVG-D1 - ~ostial SVG 80% eccentric lesion & anastomotic ~90% stenosis.  AL1 Guide - Luge wire - Anastomosis:    Euphora 2.0 mm x 20 mm; [employer] AngioSculpt 2.5 mm x 15 mm - reduced to ~10% Ostial SVG:   2 overlapping Xience Alpine DES 4.0 mm x 15 mm & 4.0 mm x 12 mm (for distal stent edge dissection) -- 4.3 mm  Medications:  4 mg Versed IV; 150 mcg Fentanyl IV  Angiomax Bolus & gtt  Brilinta 180 mg  IC Verapamil 400 mcg  Omnipaque Contrast: 360 mm  Impression:  Culprit lesion: Ostial ~80% and anastomotic 90% SVG-D1  Severe Native CAD - LAD 100% after SP1 with In-stent occlusion with patent LIMA-mLAD; 100% RCA with patent SVG-dRCA but ostial rPDA (1.5-2 mm vessel) ~90%; long proximal Circumflex 70-90%.  PCI of pCx with Xience Alpine DES 2.25 mm x 28 mm  PCI of Ostial SVG-D1 - 2 Xience Alpine DES 4.0 mm x 66mm & 12 mm  PTCA only of Anastomotic SVG-D1  LVEF ~40-45%, LVEDP ~20 mmHg  Hemodynamics:   Central AoP: 147/69/100 mmHg  LVP/EDP: 147/6/20 mmHg  Recommendations:  To 6C for post PCI care - TR Band removal.  RFA Sheath removal with manual compression.  DAPT x minimum of 1 year  With  moderately reduced LVEF & significant Troponin elevation, would not d/c in AM -- perhaps Saturday.  Full note to follow  Leonie Man, M.D., M.S. Interventional Cardiologist   Pager # [phone]  06/05/2014 5:56 PM

## 2014-06-05 NOTE — Progress Notes (Signed)
Site area: right groin  Site Prior to Removal:  Level 0  Pressure Applied For 25 MINUTES    Minutes Beginning at 2030  Manual:   Yes.    Patient Status During Pull:  stable  Post Pull Groin Site:  Level 1  Post Pull Instructions Given:  Yes.    Post Pull Pulses Present:  Yes.    Dressing Applied:  Yes.    Comments:  Gauze dressing applied, bruising noted above site. No hematoma, area soft.

## 2014-06-05 NOTE — Progress Notes (Addendum)
Subjective: Mild chest discomfort/tightness.  Slept well.   Objective: Vital signs in last 24 hours: Temp:  [97.8 F (36.6 C)-98.1 F (36.7 C)] 98.1 F (36.7 C) (07/16 0430) Pulse Rate:  [53-81] 81 (07/16 0430) Resp:  [12-20] 20 (07/16 0430) BP: (94-161)/(49-95) 135/67 mmHg (07/16 0430) SpO2:  [95 %-100 %] 99 % (07/16 0430) Weight:  [149 lb 0.5 oz (67.6 kg)-160 lb (72.576 kg)] 149 lb 0.5 oz (67.6 kg) (07/16 0026)    Intake/Output from previous day: 07/15 0701 - 07/16 0700 In: -  Out: 900 [Urine:900] Intake/Output this shift:    Medications Current Facility-Administered Medications  Medication Dose Route Frequency Provider Last Rate Last Dose  . 0.9 %  sodium chloride infusion   Intravenous Continuous Arnoldo Lenis, MD 50 mL/hr at 06/04/14 1956    . 0.9 %  sodium chloride infusion  250 mL Intravenous PRN Cecilie Kicks, NP      . 0.9 %  sodium chloride infusion  1 mL/kg/hr Intravenous Continuous Cecilie Kicks, NP 67.6 mL/hr at 06/05/14 0436 1 mL/kg/hr at 06/05/14 0436  . acetaminophen (TYLENOL) tablet 650 mg  650 mg Oral Q4H PRN Arnoldo Lenis, MD      . Derrill Memo ON 06/06/2014] aspirin EC tablet 81 mg  81 mg Oral Daily Arnoldo Lenis, MD      . atorvastatin (LIPITOR) tablet 80 mg  80 mg Oral q1800 Arnoldo Lenis, MD   80 mg at 06/04/14 2149  . heparin ADULT infusion 100 units/mL (25000 units/250 mL)  800 Units/hr Intravenous Continuous Deboraha Sprang, RPH 8 mL/hr at 06/05/14 0500 800 Units/hr at 06/05/14 0500  . insulin aspart (novoLOG) injection 0-5 Units  0-5 Units Subcutaneous QHS Cecilie Kicks, NP   5 Units at 06/04/14 2113  . insulin aspart (novoLOG) injection 0-9 Units  0-9 Units Subcutaneous TID WC Cecilie Kicks, NP      . insulin glargine (LANTUS) injection 40 Units  40 Units Subcutaneous QHS Arnoldo Lenis, MD   40 Units at 06/04/14 2148  . metoprolol tartrate (LOPRESSOR) tablet 12.5 mg  12.5 mg Oral BID Arnoldo Lenis, MD      . morphine 2 MG/ML  injection 2 mg  2 mg Intravenous Q4H PRN Arnoldo Lenis, MD   2 mg at 06/04/14 2234  . nitroGLYCERIN (NITROSTAT) SL tablet 0.4 mg  0.4 mg Sublingual Q5 Min x 3 PRN Arnoldo Lenis, MD      . ondansetron [facility]) injection 4 mg  4 mg Intravenous Q6H PRN Arnoldo Lenis, MD      . sodium chloride 0.9 % injection 3 mL  3 mL Intravenous Q12H Cecilie Kicks, NP      . sodium chloride 0.9 % injection 3 mL  3 mL Intravenous PRN Cecilie Kicks, NP        PE: General appearance: alert, cooperative and no distress Lungs: clear to auscultation bilaterally Heart: regular rate and rhythm, S1, S2 normal, no murmur, click, rub or gallop Extremities: No LEE Pulses: 2+ and symmetric Skin: Warm and dry Neurologic: Grossly normal  Lab Results:   Recent Labs  06/04/14 1731 06/05/14 0200  WBC 8.5 9.3  HGB 13.0 11.0*  HCT 37.6 32.5*  PLT 239 196   BMET  Recent Labs  06/04/14 1731 06/05/14 0200  NA 139 140  K 4.5 3.3*  CL 101 106  CO2 23 19  GLUCOSE 362* 272*  BUN 20 17  CREATININE 1.47* 1.11*  CALCIUM 9.1  6.9*   PT/INR  Recent Labs  06/04/14 1731 06/05/14 0200  LABPROT 13.8 22.9*  INR 1.06 2.02*   Cardiac Panel (last 3 results)  Recent Labs  06/04/14 2018 06/05/14 0200  TROPONINI >20.00* >20.00*      Assessment/Plan Ms. Steinbach is a 79 y.o.female hx of CAD with 3 vessel CABG in 07/2003, LVEF 50-55% by echo, renal artery stenosis with prior stenting, HTN, hx of CVA, DM, breast CA s/p left mastectomy/radiation/chemo, CKD, hyperlipidemia.  Admitted with chest pain. Started this morning at rest, 6/10 sharp pain in mid and left chest, + nausea and vomiting. No diaphoresis, no palpitations. No SOB. Reports over the last several weeks had had some generalized fatigue, DOE. She is unsure if this pain is similar to prior episodes. Pain lasted for several hours, better with NG. After 3 NG in ER bp dropped to 90s but has since normalized.   Last cath in our system Jan 2012:LVEF 40%,  LM normal, LCX small patent vessel, LAD prox stent with 90% ISR, RCA prox 80%. SVG- RCA patent, SVG-diag patent, LIMA-LAD patent.   Principal Problem:   NSTEMI (non-ST elevated myocardial infarction) Active Problems:   Chronic systolic congestive heart failure   CAD (coronary artery disease) of artery bypass graft   Hypertension   DM (diabetes mellitus), type 2, uncontrolled with complications   Chronic kidney disease (CKD), stage II (mild)   Hypothyroidism   Hyperlipidemia   History of DVT (deep vein thrombosis)   History of breast cancer   Hypokalemia  Plan:   Troponin > 20.00.  On IV heparin and NTG.  SCr improved to 1.11.  Left heart cath today at 1330hrs, however, her INR is 2.02 this morning but it was drawn from heparin line.  Redraw this morning.  Replace K+.  BP and HR stable this morning.  ASA, lipitor, lopressor 12.5.   DM:  Lantus(only half given last night), SS insulin.    LOS: 1 day    HAGER, BRYAN PA-C 06/05/2014 7:08 AM   Patient seen and examined. Agree with assessment and plan.79 yo WF who is 11 yrs s/p CABG. She has a h/o multiple TIA's and subsequent CVA 201. She presented yesterday with chest pain. ECG yesterday showed inferolateral ST changes. Pt has had recurrent chest tightness. Trop + >20 this am. BP ~ 100. Will increase fluids, IV NTG titration as BP allows, IV heparin; f/u ECG and plan cath this am. Discussed in detail with patient who understands. Her memory is poor and she cannot recall specifics of her past history. K replete with K 3.3. Re check Ca with Phos and INR (not on coumadin with ?2.02 on lab drawn earlier). F/U ECG just done shows ILBBB with progressive STT changes c/w NSTEMI.   Troy Sine, MD, [facility] 06/05/2014 8:15 AM

## 2014-06-05 NOTE — Interval H&P Note (Signed)
History and Physical Interval Note:  06/05/2014 2:30 PM  JENI DULING  has presented today for surgery, with the diagnosis of NSTEMI.  The various methods of treatment have been discussed with the patient and family. After consideration of risks, benefits and other options for treatment, the patient has consented to  Procedure(s): LEFT HEART CATHETERIZATION WITH CORONARY/GRAFT ANGIOGRAM (N/A) as a surgical intervention .  The patient's history has been reviewed, patient examined, no change in status, stable for surgery.  I have reviewed the patient's chart and labs.  Questions were answered to the patient's satisfaction.     HARDING,DAVID W   Cath Lab Visit (complete for each Cath Lab visit)  Clinical Evaluation Leading to the Procedure:   ACS: Yes.    Non-ACS:    Anginal Classification: CCS IV  Anti-ischemic medical therapy: Minimal Therapy (1 class of medications)  Non-Invasive Test Results: No non-invasive testing performed  Prior CABG: Previous CABG

## 2014-06-06 ENCOUNTER — Encounter (HOSPITAL_COMMUNITY): Payer: Self-pay | Admitting: Cardiology

## 2014-06-06 LAB — CBC
HCT: 26 % — ABNORMAL LOW (ref 36.0–46.0)
HCT: 35.7 % — ABNORMAL LOW (ref 36.0–46.0)
Hemoglobin: 8.9 g/dL — ABNORMAL LOW (ref 12.0–15.0)
Hemoglobin: 12.2 g/dL (ref 12.0–15.0)
MCH: 31.2 pg (ref 26.0–34.0)
MCH: 31.4 pg (ref 26.0–34.0)
MCHC: 34.2 g/dL (ref 30.0–36.0)
MCHC: 34.2 g/dL (ref 30.0–36.0)
MCV: 91.2 fL (ref 78.0–100.0)
MCV: 92 fL (ref 78.0–100.0)
PLATELETS: 153 10*3/uL (ref 150–400)
PLATELETS: 211 10*3/uL (ref 150–400)
RBC: 2.85 MIL/uL — ABNORMAL LOW (ref 3.87–5.11)
RBC: 3.88 MIL/uL (ref 3.87–5.11)
RDW: 12.9 % (ref 11.5–15.5)
RDW: 12.8 % (ref 11.5–15.5)
WBC: 8.5 10*3/uL (ref 4.0–10.5)
WBC: 12.2 10*3/uL — AB (ref 4.0–10.5)

## 2014-06-06 LAB — BASIC METABOLIC PANEL
ANION GAP: 16 — AB (ref 5–15)
Anion gap: 16 — ABNORMAL HIGH (ref 5–15)
BUN: 9 mg/dL (ref 6–23)
BUN: 10 mg/dL (ref 6–23)
CALCIUM: 6.2 mg/dL — AB (ref 8.4–10.5)
CALCIUM: 8.2 mg/dL — AB (ref 8.4–10.5)
CHLORIDE: 112 meq/L (ref 96–112)
CO2: 16 meq/L — AB (ref 19–32)
CO2: 17 mEq/L — ABNORMAL LOW (ref 19–32)
CREATININE: 1.05 mg/dL (ref 0.50–1.10)
Chloride: 105 mEq/L (ref 96–112)
Creatinine, Ser: 0.78 mg/dL (ref 0.50–1.10)
GFR calc Af Amer: >90 mL/min (ref >90)
GFR calc non Af Amer: 79 mL/min — ABNORMAL LOW (ref >90)
GFR calc non Af Amer: 50 mL/min — ABNORMAL LOW (ref >90)
GFR, EST AFRICAN AMERICAN: 58 mL/min — AB (ref >90)
Glucose, Bld: 129 mg/dL — ABNORMAL HIGH (ref 70–99)
Glucose, Bld: 219 mg/dL — ABNORMAL HIGH (ref 70–99)
Potassium: 3 mEq/L — ABNORMAL LOW (ref 3.7–5.3)
Potassium: 5 mEq/L (ref 3.7–5.3)
Sodium: 144 mEq/L (ref 137–147)
Sodium: 138 mEq/L (ref 137–147)

## 2014-06-06 LAB — GLUCOSE, CAPILLARY
GLUCOSE-CAPILLARY: 128 mg/dL — AB (ref 70–99)
GLUCOSE-CAPILLARY: 159 mg/dL — AB (ref 70–99)
GLUCOSE-CAPILLARY: 210 mg/dL — AB (ref 70–99)
GLUCOSE-CAPILLARY: 171 mg/dL — AB (ref 70–99)
Glucose-Capillary: 194 mg/dL — ABNORMAL HIGH (ref 70–99)
Glucose-Capillary: 197 mg/dL — ABNORMAL HIGH (ref 70–99)

## 2014-06-06 LAB — VITAMIN D 25 HYDROXY (VIT D DEFICIENCY, FRACTURES): VIT D 25 HYDROXY: 36 ng/mL (ref 30–89)

## 2014-06-06 LAB — HEPARIN LEVEL (UNFRACTIONATED)

## 2014-06-06 LAB — POTASSIUM: POTASSIUM: 4.5 meq/L (ref 3.7–5.3)

## 2014-06-06 LAB — TROPONIN I
Troponin I: >20 ng/mL (ref <0.30)
Troponin I: 18.49 ng/mL (ref <0.30)
Troponin I: >20 ng/mL (ref <0.30)

## 2014-06-06 MED ORDER — SODIUM CHLORIDE 0.9 % IV SOLN
INTRAVENOUS | Status: DC
  Administered 2014-06-06 – 2014-06-07 (×2): via INTRAVENOUS

## 2014-06-06 MED ORDER — POTASSIUM CHLORIDE 10 MEQ/100ML IV SOLN
10.0000 meq | INTRAVENOUS | Status: AC
  Administered 2014-06-06 (×4): 10 meq via INTRAVENOUS
  Filled 2014-06-06 (×4): qty 100

## 2014-06-06 MED ORDER — POTASSIUM CHLORIDE CRYS ER 20 MEQ PO TBCR
40.0000 meq | EXTENDED_RELEASE_TABLET | Freq: Once | ORAL | Status: AC
  Administered 2014-06-06: 07:00:00 40 meq via ORAL
  Filled 2014-06-06: qty 2

## 2014-06-06 MED ORDER — METOPROLOL TARTRATE 25 MG PO TABS
25.0000 mg | ORAL_TABLET | Freq: Two times a day (BID) | ORAL | Status: DC
  Administered 2014-06-06 – 2014-06-08 (×4): 25 mg via ORAL
  Filled 2014-06-06 (×4): qty 1

## 2014-06-06 MED ORDER — SODIUM CHLORIDE 0.9 % IV SOLN
1.0000 g | Freq: Once | INTRAVENOUS | Status: AC
  Administered 2014-06-06: 07:00:00 1 g via INTRAVENOUS
  Filled 2014-06-06: qty 10

## 2014-06-06 MED ORDER — LISINOPRIL 5 MG PO TABS
5.0000 mg | ORAL_TABLET | Freq: Every day | ORAL | Status: DC
  Administered 2014-06-07 – 2014-06-08 (×2): 5 mg via ORAL
  Filled 2014-06-06 (×2): qty 1

## 2014-06-06 MED FILL — Sodium Chloride IV Soln 0.9%: INTRAVENOUS | Qty: 50 | Status: AC

## 2014-06-06 NOTE — Progress Notes (Signed)
Patient seen, examined. Available data reviewed. Agree with findings, assessment, and plan as outlined by Almyra Deforest, PA-C. The patient is now stable without recurrent chest pain. However, her troponin remains greater than 20. I agree with another 24 hours in the hospital. She should be able to be discharged tomorrow. On exam, her heart is regular rate and rhythm, lungs clear, groin site is clear without hematoma. Medications reviewed. Will stop IV nitroglycerin. Continue other cardiac medicines.  Sherren Mocha, M.D. 06/06/2014 6:30 PM

## 2014-06-06 NOTE — Progress Notes (Signed)
CRITICAL VALUE ALERT    Critical value received:  Troponin > 20.00  Date of notification:  06/06/2014  Time of notification 21:39  Critical value read back:Yes.    Nurse who received alert:  Maxwell Marion RN  MD notified (1st page):  Dr. Cletus Gash  Time of first page:  21:39  MD notified (2nd page):  Time of second page:  Responding MD: Dr. Cletus Gash  Time MD responded: 21:39

## 2014-06-06 NOTE — Progress Notes (Signed)
Patient Name: [name] Date of Encounter: 06/06/2014     Principal Problem:   NSTEMI (non-ST elevated myocardial infarction) Active Problems:   Chronic systolic congestive heart failure   CAD (coronary artery disease) of artery bypass graft   Essential hypertension   DM (diabetes mellitus), type 2, uncontrolled with complications   Chronic kidney disease (CKD), stage II (mild)   Hypothyroidism   Hyperlipidemia   History of DVT (deep vein thrombosis)   History of breast cancer    SUBJECTIVE  Denies any chest pain or SOB last night. Has a little back pain, however appears to be chronic. Per nursing staff, no significant event, slept yesterday after phenergan   CURRENT MEDS . aspirin EC  81 mg Oral Daily  . atorvastatin  80 mg Oral q1800  . calcium gluconate  1 g Intravenous Once  . insulin aspart  0-5 Units Subcutaneous QHS  . insulin aspart  0-9 Units Subcutaneous TID WC  . insulin glargine  40 Units Subcutaneous QHS  . metoprolol tartrate  12.5 mg Oral BID  . potassium chloride  40 mEq Oral Once  . ticagrelor  90 mg Oral BID    OBJECTIVE  Filed Vitals:   06/06/14 0400 06/06/14 0440 06/06/14 0500 06/06/14 0600  BP: 141/76 125/74 135/76 140/83  Pulse:  80 84 81  Temp:  98.7 F (37.1 C)    TempSrc:  Oral    Resp:  18    Height:      Weight:      SpO2:  94% 95% 99%    Intake/Output Summary (Last 24 hours) at 06/06/14 0624 Last data filed at 06/06/14 0600  Gross per 24 hour  Intake 1661.81 ml  Output   2100 ml  Net -438.19 ml   Filed Weights   06/04/14 2210 06/05/14 0026 06/06/14 0003  Weight: 149 lb 0.5 oz (67.6 kg) 149 lb 0.5 oz (67.6 kg) 155 lb 13.8 oz (70.7 kg)    PHYSICAL EXAM  General: Pleasant, NAD. Neuro: Alert and oriented X 3. Moves all extremities spontaneously. Psych: Normal affect. HEENT:  Normal  Neck: Supple without bruits or JVD. Lungs:  Resp regular and unlabored, CTA. Heart: RRR no s3, s4 1/6 systolic murmur Abdomen: Soft,  non-tender, non-distended, BS + x 4. R groin cath site clean, dry, intact without significant hematoma or bleeding. No pulsatile mass or bruit appreciated. L radial cath site stable appearance Extremities: No clubbing, cyanosis or edema. DP/PT/Radials 2+ and equal bilaterally.  Accessory Clinical Findings  CBC  Recent Labs  06/04/14 1731 06/05/14 0200 06/06/14 0407  WBC 8.5 9.3 8.5  NEUTROABS 6.5  --   --   HGB 13.0 11.0* 8.9*  HCT 37.6 32.5* 26.0*  MCV 90.2 90.5 91.2  PLT 239 196 127   Basic Metabolic Panel  Recent Labs  06/05/14 0200 06/05/14 0815 06/06/14 0407  NA 140  --  144  K 3.3*  --  3.0*  CL 106  --  112  CO2 19  --  16*  GLUCOSE 272*  --  129*  BUN 17  --  9  CREATININE 1.11*  --  0.78  CALCIUM 6.9* 8.2* 6.2*  PHOS  --  3.2  --    Liver Function Tests  Recent Labs  06/04/14 1731  AST 43*  ALT 20  ALKPHOS 100  BILITOT 0.3  PROT 6.8  ALBUMIN 3.1*   Cardiac Enzymes  Recent Labs  06/04/14 2018 06/05/14 0200 06/05/14 0815  TROPONINI >20.00* >20.00* >20.00*    TELE  NSR with HR 70-80s  ECG  NSR with mild ST elevation in lead V1-V3, III and avF. TWI in anterolateral leads  Radiology/Studies  Dg Chest Portable 1 View  06/04/2014   CLINICAL DATA:  Chest pain. Coronary artery disease. Breast carcinoma.  EXAM: PORTABLE CHEST - 1 VIEW  COMPARISON:  03/12/2012  FINDINGS: The heart size and mediastinal contours are within normal limits. Both lungs are clear. No evidence of pleural effusion. No mass or lymphadenopathy identified. Prior CABG again noted. Prior left mastectomy noted. The visualized skeletal structures are unremarkable.  IMPRESSION: No active disease.   Electronically Signed   By: Earle Gell M.D.   On: 06/04/2014 18:17    ASSESSMENT AND PLAN  1. NSTEMI  - culprit ostial 80% treated with DES x 2 and anastomotic 90% SVG-D1 treated with PTCA, patent LIMA to mLAD, patent SVG-rRCA, 90% ostial rPDA, 70-90% prox LCx treated with DES  -  last trop >20 yesterday morning, recheck troponin, expect trending down before discharge tomorrow   - d/c nitro gtt   - increase metoprolol to 25mg  BID  2. Chronic systolic congestive heart failure, EF 50-55% 2013 (improved)   3. CAD (coronary artery disease) of artery bypass graft (LIMA to mLAD, SVG to D1, SVG to rPDA)  4. Hypertension  6. DM (diabetes mellitus), type 2, uncontrolled with complications  7. Chronic kidney disease (CKD), stage II (mild)  8. Hypothyroidism  9. Hyperlipidemia  10. History of DVT (deep vein thrombosis)    11. Hypokalemia  - did not tolerate PO KCl with N/V, will add IV KCl after Ca repletion  12. Hypocalcemia  - replete with 1 G CaGluconate, slow infusion to avoid cardiac arrythmia   - check Vit D panel  13. Anemia   - stat CBC, continue to monitor  Disposition: possible discharge tomorrow  Signed, Almyra Deforest PA-C Pager: [phone]

## 2014-06-06 NOTE — Progress Notes (Signed)
CARDIAC REHAB PHASE I   PRE:  Rate/Rhythm: 86 SR    BP: out of BR    SaO2:   MODE:  Ambulation: 180 ft   POST:  Rate/Rhythm: 88 SR    BP: sitting 139/65     SaO2:   Pt very slow, weak. Doesn't seem to do much at home. Does st she does light housecleaning. Used RW and slow pace, needed correction for upright posture. Return to recliner, pt would have preferred bed. Discussed MI, stent, Brilinta and NTG. Pt seems to care very little. Son in room and voiced understanding of NTG. Will give Brilinta book and reiterate with son. Pt sts she doesn't watch her diet but does check her CBG and give insulin. Left diet sheets. N/a for CRPII or ex gl. Has RW at home that she can use. [id number]   Josephina Shih [place] CES, ACSM 06/06/2014 9:42 AM

## 2014-06-06 NOTE — Progress Notes (Signed)
TR BAND REMOVAL  LOCATION:    left radial  DEFLATED PER PROTOCOL:    Yes.    TIME BAND OFF / DRESSING APPLIED:    21:30   SITE UPON ARRIVAL:    Level 0  SITE AFTER BAND REMOVAL:    Level 0  REVERSE ALLEN'S TEST:     positive  CIRCULATION SENSATION AND MOVEMENT:    Within Normal Limits   Yes.    COMMENTS:   Pt tolerated removal of TR band without complications, will continue to monitor patient,

## 2014-06-07 DIAGNOSIS — I2510 Atherosclerotic heart disease of native coronary artery without angina pectoris: Secondary | ICD-10-CM

## 2014-06-07 DIAGNOSIS — I25810 Atherosclerosis of coronary artery bypass graft(s) without angina pectoris: Secondary | ICD-10-CM

## 2014-06-07 DIAGNOSIS — Z9861 Coronary angioplasty status: Secondary | ICD-10-CM

## 2014-06-07 DIAGNOSIS — I200 Unstable angina: Secondary | ICD-10-CM

## 2014-06-07 LAB — CBC
HEMATOCRIT: 41 % (ref 36.0–46.0)
HEMOGLOBIN: 13.7 g/dL (ref 12.0–15.0)
MCH: 31.2 pg (ref 26.0–34.0)
MCHC: 33.4 g/dL (ref 30.0–36.0)
MCV: 93.4 fL (ref 78.0–100.0)
Platelets: 217 10*3/uL (ref 150–400)
RBC: 4.39 MIL/uL (ref 3.87–5.11)
RDW: 13.1 % (ref 11.5–15.5)
WBC: 11.2 10*3/uL — ABNORMAL HIGH (ref 4.0–10.5)

## 2014-06-07 LAB — GLUCOSE, CAPILLARY
GLUCOSE-CAPILLARY: 263 mg/dL — AB (ref 70–99)
GLUCOSE-CAPILLARY: 169 mg/dL — AB (ref 70–99)
Glucose-Capillary: 125 mg/dL — ABNORMAL HIGH (ref 70–99)
Glucose-Capillary: 246 mg/dL — ABNORMAL HIGH (ref 70–99)

## 2014-06-07 LAB — TROPONIN I: Troponin I: >20 ng/mL (ref <0.30)

## 2014-06-07 LAB — BASIC METABOLIC PANEL
Anion gap: 14 (ref 5–15)
BUN: 10 mg/dL (ref 6–23)
CO2: 21 mEq/L (ref 19–32)
CREATININE: 1.17 mg/dL — AB (ref 0.50–1.10)
Calcium: 8.8 mg/dL (ref 8.4–10.5)
Chloride: 106 mEq/L (ref 96–112)
GFR calc non Af Amer: 44 mL/min — ABNORMAL LOW (ref >90)
GFR, EST AFRICAN AMERICAN: 51 mL/min — AB (ref >90)
GLUCOSE: 164 mg/dL — AB (ref 70–99)
Potassium: 4.2 mEq/L (ref 3.7–5.3)
Sodium: 141 mEq/L (ref 137–147)

## 2014-06-07 LAB — TSH: TSH: 0.057 u[IU]/mL — ABNORMAL LOW (ref 0.350–4.500)

## 2014-06-07 NOTE — Progress Notes (Signed)
Patient Name: [name] Date of Encounter: 06/07/2014     Principal Problem:   NSTEMI (non-ST elevated myocardial infarction) Active Problems:   Chronic combined systolic and diastolic congestive heart failure, NYHA class 2   Atherosclerosis of autologous vein coronary artery bypass graft with unstable angina pectoris / NSTMI   Essential hypertension   DM (diabetes mellitus), type 2, uncontrolled with complications   Chronic kidney disease (CKD), stage II (mild)   Hypothyroidism   Hyperlipidemia with target LDL less than 70   History of DVT (deep vein thrombosis)   History of breast cancer   CAD S/P percutaneous coronary angioplasty: DES to Prox Cx & SVG-D1; PTCA of Anastomotic SVG-D1 lesion    SUBJECTIVE  No CP or SOB.  CURRENT MEDS . aspirin EC  81 mg Oral Daily  . atorvastatin  80 mg Oral q1800  . insulin aspart  0-5 Units Subcutaneous QHS  . insulin aspart  0-9 Units Subcutaneous TID WC  . insulin glargine  40 Units Subcutaneous QHS  . lisinopril  5 mg Oral Daily  . metoprolol tartrate  25 mg Oral BID  . ticagrelor  90 mg Oral BID    OBJECTIVE  Filed Vitals:   06/06/14 2001 06/06/14 2156 06/07/14 0002 06/07/14 0508  BP: 109/52 121/76 117/34 117/31  Pulse: 78 78 68 77  Temp: 98.1 F (36.7 C)  98 F (36.7 C) 98.3 F (36.8 C)  TempSrc: Oral  Oral Oral  Resp: 17  18 18   Height:      Weight:   154 lb 8.7 oz (70.1 kg)   SpO2: 97%  95% 93%    Intake/Output Summary (Last 24 hours) at 06/07/14 0726 Last data filed at 06/07/14 0644  Gross per 24 hour  Intake   1760 ml  Output   3500 ml  Net  -1740 ml   Filed Weights   06/05/14 0026 06/06/14 0003 06/07/14 0002  Weight: 149 lb 0.5 oz (67.6 kg) 155 lb 13.8 oz (70.7 kg) 154 lb 8.7 oz (70.1 kg)    PHYSICAL EXAM  General: Pleasant, NAD. Neuro: Alert and oriented X 3. Moves all extremities spontaneously. Psych: Normal affect. HEENT:  Normal  Neck: Supple without bruits or JVD. Lungs:  Resp regular and  unlabored, CTA. Heart: RRR no s3, s4, or murmurs. Abdomen: Soft, non-tender, non-distended, BS + x 4.  Extremities: No clubbing, cyanosis or edema. DP/PT/Radials 2+ and equal bilaterally.  Accessory Clinical Findings  CBC  Recent Labs  06/04/14 1731  06/06/14 0407 06/06/14 0701  WBC 8.5  < > 8.5 12.2*  NEUTROABS 6.5  --   --   --   HGB 13.0  < > 8.9* 12.2  HCT 37.6  < > 26.0* 35.7*  MCV 90.2  < > 91.2 92.0  PLT 239  < > 153 211  < > = values in this interval not displayed. Basic Metabolic Panel  Recent Labs  06/05/14 0815  06/06/14 1440 06/06/14 1958 06/07/14 0330  NA  --   < > 138  --  141  K  --   < > 5.0 4.5 4.2  CL  --   < > 105  --  106  CO2  --   < > 17*  --  21  GLUCOSE  --   < > 219*  --  164*  BUN  --   < > 10  --  10  CREATININE  --   < > 1.05  --  1.17*  CALCIUM 8.2*  < > 8.2*  --  8.8  PHOS 3.2  --   --   --   --   < > = values in this interval not displayed. Liver Function Tests  Recent Labs  06/04/14 1731  AST 43*  ALT 20  ALKPHOS 100  BILITOT 0.3  PROT 6.8  ALBUMIN 3.1*    Cardiac Enzymes  Recent Labs  06/06/14 1440 06/06/14 2040 06/07/14 0330  TROPONINI 18.49* >20.00* >20.00*    TELE  NSR, with ILBBB. 11 beats of an atrial tachycardia.   Radiology/Studies  Dg Chest Portable 1 View  06/04/2014   CLINICAL DATA:  Chest pain. Coronary artery disease. Breast carcinoma.  EXAM: PORTABLE CHEST - 1 VIEW  COMPARISON:  03/12/2012  FINDINGS: The heart size and mediastinal contours are within normal limits. Both lungs are clear. No evidence of pleural effusion. No mass or lymphadenopathy identified. Prior CABG again noted. Prior left mastectomy noted. The visualized skeletal structures are unremarkable.  IMPRESSION: No active disease.   Electronically Signed   By: Earle Gell M.D.   On: 06/04/2014 18:17    ASSESSMENT AND PLAN  [name] is a 79 y.o.female hx of CAD with 3 vessel CABG in 07/2003, LVEF 50-55% by echo, renal artery stenosis  with prior stenting, HTN, hx of CVA, DM, breast CA s/p left mastectomy/radiation/chemo, CKD, and hyperlipidemia who presented to [facility] on 06/04/14 with chest pain and NSTEMI.   NSTEMI  -- She underwent LHC on 06/05/14 with DES to native LCX, PCTA to the anastomosis of SVG to D1 and DES to proximal. Patent LIMA to mLAD, patent SVG-rRCA, 90% ostial rPDA. There was TIMI 2 flow in the SVG--> Diag at the end of the case. This may explain her continued elevated troponin. -- Her troponin was >20 and remained there until 06/06/14 when it went down to 18.4. It then increased back up to >20 where it remains today. Will likely transfer to the floor and wait for enzymes to start trending downwards before discharge.  -- Increased to metoprolol to 25mg  BID yesterday. Continue ASA/Brilinta and Lipitor 80mg   Chronic systolic congestive heart failure, EF 40-45% -- Continue ACE and BB  Hypokalemia  -- Rresolved 3.0-->4.2 today after IV KCl repletion   Hypocalcemia  -- Resolved 6.2-->8.8 after repleted with 1 G CaGluconate -- Vit D level normal at 36  Anemia  -- Hg 8.9 yesterday. Repeat CBC with Hg 12.2. Will repeat today.   Hypertension - BPs have been stable  DM type 2  CKD, stage II   Hx of hypothyroidism - will check a TSH while inpatient. Not on synthroid  Hyperlipidemia   History of DVT (deep vein thrombosis)   Tyrell Antonio PA-C  Pager [phone]  Attending Note:   The patient was seen and examined.  Agree with assessment and plan as noted above.  Changes made to the above note as needed.  I have personally reviewed the angiograms from her diagnostic and interventional procedures. She had no reflow for quite some time and at the end of the case it appears that she had TIMI 2 flow down her saphenous vein graft to the diagonal vessel.  I Think a sluggish flow explains her persistently elevated troponin level.  She has had some arrhythmias-they appear to be atrial runs. I do not think  she's having any significant episodes of ventricular tachycardia.  We'll continue with her current medications. She's on metoprolol 25 mg twice a day.  We'll transfer her down to the telemetry floor and ambulate her one additional day. We'll recheck her troponin level tomorrow. She does not have any clinical problems will discharge her home tomorrow. She'll need a followup echocardiogram in several weeks as an outpatient.   Thayer Headings, Brooke Bonito., MD, [facility] 06/07/2014, 8:22 AM

## 2014-06-07 NOTE — Progress Notes (Signed)
CARDIAC REHAB PHASE I   PRE:  Rate/Rhythm: 66 SR    BP: sitting 120/60    SaO2: 100 RA  MODE:  Ambulation: 240 ft   POST:  Rate/Rhythm: 85 SR    BP: sitting 110/60     SaO2: 95 RA  Pt looks much better. Family present, pt smiling, more animated. Able to walk with rollator, steady. She did endorse that she was having left sided chest discomfort while walking and that she had had it since yesterday. This apparently went away after walk with rest. Reviewed ed with pt and family. Encouraged her to walk with family. Later I saw her up with her family member and she sts she was not having any chest discomfort at that time.  [phone]   [name] [place] CES, ACSM 06/07/2014 3:59 PM

## 2014-06-08 DIAGNOSIS — I10 Essential (primary) hypertension: Secondary | ICD-10-CM

## 2014-06-08 DIAGNOSIS — I509 Heart failure, unspecified: Secondary | ICD-10-CM

## 2014-06-08 DIAGNOSIS — I5042 Chronic combined systolic (congestive) and diastolic (congestive) heart failure: Secondary | ICD-10-CM

## 2014-06-08 LAB — CBC
HCT: 35.5 % — ABNORMAL LOW (ref 36.0–46.0)
HEMOGLOBIN: 11.8 g/dL — AB (ref 12.0–15.0)
MCH: 30.8 pg (ref 26.0–34.0)
MCHC: 33.2 g/dL (ref 30.0–36.0)
MCV: 92.7 fL (ref 78.0–100.0)
PLATELETS: 196 10*3/uL (ref 150–400)
RBC: 3.83 MIL/uL — AB (ref 3.87–5.11)
RDW: 13.3 % (ref 11.5–15.5)
WBC: 9 10*3/uL (ref 4.0–10.5)

## 2014-06-08 LAB — TROPONIN I: Troponin I: 13.59 ng/mL (ref <0.30)

## 2014-06-08 LAB — GLUCOSE, CAPILLARY
GLUCOSE-CAPILLARY: 135 mg/dL — AB (ref 70–99)
GLUCOSE-CAPILLARY: 223 mg/dL — AB (ref 70–99)

## 2014-06-08 MED ORDER — NITROGLYCERIN 0.4 MG SL SUBL: 0.4000 mg | SUBLINGUAL_TABLET | SUBLINGUAL | Status: DC | PRN

## 2014-06-08 MED ORDER — CYCLOBENZAPRINE HCL 5 MG PO TABS: 5.0000 mg | ORAL_TABLET | Freq: Three times a day (TID) | ORAL | Status: DC | PRN

## 2014-06-08 MED ORDER — ACETAMINOPHEN 325 MG PO TABS: 650.0000 mg | ORAL_TABLET | ORAL | Status: DC | PRN

## 2014-06-08 MED ORDER — TICAGRELOR 90 MG PO TABS: 90.0000 mg | ORAL_TABLET | Freq: Two times a day (BID) | ORAL | Status: DC

## 2014-06-08 MED ORDER — ATORVASTATIN CALCIUM 80 MG PO TABS: 80.0000 mg | ORAL_TABLET | Freq: Every day | ORAL | Status: DC

## 2014-06-08 MED ORDER — ISOSORBIDE MONONITRATE 15 MG HALF TABLET: 15.0000 mg | ORAL_TABLET | Freq: Every day | ORAL | Status: DC

## 2014-06-08 MED ORDER — LISINOPRIL 5 MG PO TABS: 5.0000 mg | ORAL_TABLET | Freq: Every day | ORAL | Status: DC

## 2014-06-08 MED ORDER — ASPIRIN 81 MG PO TBEC: 81.0000 mg | DELAYED_RELEASE_TABLET | Freq: Every day | ORAL | Status: DC

## 2014-06-08 MED ORDER — ISOSORBIDE MONONITRATE 15 MG HALF TABLET
15.0000 mg | ORAL_TABLET | Freq: Every day | ORAL | Status: DC
  Administered 2014-06-08: 15 mg via ORAL
  Filled 2014-06-08: qty 1

## 2014-06-08 NOTE — Discharge Summary (Signed)
Patient ID: [name],  MRN: [id number], DOB/AGE: 22-Sep-1935 79 y.o.  Admit date: 06/04/2014 Discharge date: 06/08/2014  Primary Care Provider: Cyndi Bender PA Primary Cardiologist: Dr Claiborne Billings (new)  Discharge Diagnoses Principal Problem:   NSTEMI (non-ST elevated myocardial infarction) Active Problems:   CAD S/P DES to Prox Cx & SVG-D1; PTCA of Anastomotic SVG-D1 lesion   Chronic combined systolic and diastolic congestive heart failure, NYHA class 2   DM (diabetes mellitus), type 2, uncontrolled with complications   Chronic kidney disease (CKD), stage II (mild)   Hypothyroidism- (TSH low, Synthroid held)   Essential hypertension   Hyperlipidemia with target LDL less than 70   History of breast cancer    Procedures: Cath/PCI  06/05/14.   Hospital Course:  [name] is a 79 y.o.female hx of CAD with 3 vessel CABG in 07/2003, LVEF 50-55% by echo, renal artery stenosis with prior stenting, HTN, hx of CVA, DM, breast CA s/p left mastectomy/radiation/chemo, CKD, hyperlipidemia.  She was admitted with chest pain 06/04/14. It was described as 6/10 sharp pain in mid and left chest, + nausea and vomiting. No diaphoresis, no palpitations. No SOB. Reports over the last several weeks had had some generalized fatigue, DOE. She is unsure if this pain is similar to prior episodes. Pain lasted for several hours, better with NTG. After 3 NTG in ER bp dropped to 90s but has since normalized.              Last cath in our system Jan 2012:LVEF 40%, LM normal, LCX small patent vessel, LAD prox stent with 90% ISR, RCA prox 80%. SVG- RCA patent, SVG-diag patent, LIMA-LAD patent. Pt was admitted, started on Heparin and NTG. She ruled in for an MI by Troponin. She underwent cath with successful PCI DES of native CFX and SVG-Dx1.  EF reported as moderately reduced. She was transferred to the floor and ambulated. Her Troponin was coming down late on the 19th and she was discharged home. At discharge it was  noted that her TSH was low. Her synthroid has been held and we have instructed the pt's daughter to follow up this week ith her primary care provider. Her B/P was noted to be low at discharge. The pt's daughter will have this checked Monday and call us with the result.    Discharge Vitals:  Blood pressure 76/37, pulse 71, temperature 97.7 F (36.5 C), temperature source Oral, resp. rate 18, height 5\' 2"  (1.575 m), weight 154 lb 8.7 oz (70.1 kg), SpO2 98.00%.    Labs: Results for orders placed during the hospital encounter of 06/04/14 (from the past 24 hour(s))  GLUCOSE, CAPILLARY     Status: Abnormal   Collection Time    06/07/14  5:14 PM      Result Value Ref Range   Glucose-Capillary 263 (*) 70 - 99 mg/dL   Comment 1 Notify RN    GLUCOSE, CAPILLARY     Status: Abnormal   Collection Time    06/07/14  8:27 PM      Result Value Ref Range   Glucose-Capillary 169 (*) 70 - 99 mg/dL  CBC     Status: Abnormal   Collection Time    06/08/14  4:20 AM      Result Value Ref Range   WBC 9.0  4.0 - 10.5 K/uL   RBC 3.83 (*) 3.87 - 5.11 MIL/uL   Hemoglobin 11.8 (*) 12.0 - 15.0 g/dL   HCT 35.5 (*) 36.0 -  46.0 %   MCV 92.7  78.0 - 100.0 fL   MCH 30.8  26.0 - 34.0 pg   MCHC 33.2  30.0 - 36.0 g/dL   RDW 13.3  11.5 - 15.5 %   Platelets 196  150 - 400 K/uL  GLUCOSE, CAPILLARY     Status: Abnormal   Collection Time    06/08/14  7:52 AM      Result Value Ref Range   Glucose-Capillary 135 (*) 70 - 99 mg/dL  TROPONIN I     Status: Abnormal   Collection Time    06/08/14  8:00 AM      Result Value Ref Range   Troponin I 13.59 (*) <0.30 ng/mL  GLUCOSE, CAPILLARY     Status: Abnormal   Collection Time    06/08/14 11:48 AM      Result Value Ref Range   Glucose-Capillary 223 (*) 70 - 99 mg/dL    Disposition:  Follow-up Information   Follow up with Shelva Majestic A, MD. (office will call )    Specialty:  Cardiology   Contact information:   [address] [place]  [address] [phone]       Discharge Medications:    Medication List    STOP taking these medications       levothyroxine 150 MCG tablet  Commonly known as:  SYNTHROID, LEVOTHROID      TAKE these medications       acetaminophen 325 MG tablet  Commonly known as:  TYLENOL  Take 2 tablets (650 mg total) by mouth every 4 (four) hours as needed for headache or mild pain.     allopurinol 100 MG tablet  Commonly known as:  ZYLOPRIM  Take 100 mg by mouth daily as needed (for gout).     aspirin 81 MG EC tablet  Take 1 tablet (81 mg total) by mouth daily.     atorvastatin 80 MG tablet  Commonly known as:  LIPITOR  Take 1 tablet (80 mg total) by mouth daily at 6 PM.     cyclobenzaprine 5 MG tablet  Commonly known as:  FLEXERIL  Take 1 tablet (5 mg total) by mouth 3 (three) times daily as needed for muscle spasms (back pain).     furosemide 20 MG tablet  Commonly known as:  LASIX  Take 20 mg by mouth daily as needed. Swelling.     insulin glargine 100 UNIT/ML injection  Commonly known as:  LANTUS  Inject 80 Units into the skin at bedtime.     isosorbide mononitrate 15 mg Tb24 24 hr tablet  Commonly known as:  IMDUR  Take 0.5 tablets (15 mg total) by mouth daily.     lisinopril 5 MG tablet  Commonly known as:  PRINIVIL,ZESTRIL  Take 1 tablet (5 mg total) by mouth daily.     LORazepam 0.5 MG tablet  Commonly known as:  ATIVAN  Take 0.5 mg by mouth at bedtime as needed for sleep.     metoprolol tartrate 25 MG tablet  Commonly known as:  LOPRESSOR  Take 25 mg by mouth 2 (two) times daily.     nitroGLYCERIN 0.4 MG SL tablet  Commonly known as:  NITROSTAT  Place 1 tablet (0.4 mg total) under the tongue every 5 (five) minutes x 3 doses as needed for chest pain.     ticagrelor 90 MG Tabs tablet  Commonly known as:  BRILINTA  Take 1 tablet (90 mg total) by mouth 2 (two)  times daily.         Duration of Discharge Encounter: Greater than 30 minutes including physician  time.  Angelena Form PA-C 06/08/2014 3:28 PM  Monday 06/09/14- pt's daughter called- B/P 110/60. I called Ovid Curd Conroy's office about TSH follow up. Pt's daughter called about a [place] and I will arrange this.  Kerin Ransom PA-C 06/09/2014 12:30 PM

## 2014-06-08 NOTE — Discharge Instructions (Signed)
Call your doctor Monday to follow up you thyroid tests (TSH 0.057).   Coronary Angiogram with Stent Coronary angiography with stent placement is a procedure to widen or open a narrow blood vessel of the heart (coronary artery). When a coronary artery becomes partially blocked, it decreases blood flow to that area. This may lead to chest pain or a heart attack (myocardial infarction). Arteries may become blocked by cholesterol buildup (plaque) in the lining or wall.  A stent is a small piece of metal that looks like a mesh or a spring. Stent placement may be done right after a coronary angiography in which a blocked artery is found or as a treatment for a heart attack.  LET [facility] CARE PROVIDER KNOW ABOUT:  Any allergies you have.   All medicines you are taking, including vitamins, herbs, eye drops, creams, and over-the-counter medicines.   Previous problems you or members of your family have had with the use of anesthetics.   Any blood disorders you have.   Previous surgeries you have had.   Medical conditions you have. RISKS AND COMPLICATIONS Generally, coronary angiography with stent is a safe procedure. However, as with any procedure, complications can occur. Possible complications include:   Damage to the heart or its blood vessels.   A return of blockage.   Bleeding, infection, or bruising at the insertion site.   A collection of blood under the skin (hematoma) at the insertion site.  Blood clot in another part of the body.   Kidney injury.   Allergic reaction to the dye or contrast used.   Bleeding into the abdomen (retroperitoneal bleeding) BEFORE THE PROCEDURE  Do not eat or drink anything for 6 hours before the procedure.   Ask your health care provider about changing or stopping your regular medicines. This is especially important if you are taking diabetes medicines or blood thinners.  Your health care provider will make sure you understand the  procedure and the risks and potential complications associated with the procedure.  PROCEDURE  You may be given a medicine to help you relax before and during the procedure (sedative). This medicine will be given through an IV tube that is put into one of your veins.   The area where the catheter will be inserted is shaved and cleaned. This is usually done in the groin but may be done in the fold of your arm (near your elbow) or in the wrist.   A medicine will be given to numb the area where the catheter will be inserted (local anesthetic).   The catheter is inserted into an artery using a guide wire. A type of X-ray (fluoroscopy) is used to help guide the catheter to the opening of the blocked artery.   A dye is then injected into the catheter, and X-rays are taken. The dye helps to show where any narrowing or blockages are located in the heart arteries.   A tiny wire is guided to the blocked spot, and a balloon is inflated to make the artery wider. The stent is expanded and crushes the plaque into the wall of the vessel. The stent holds the area open like a scaffolding and improves the blood flow.   Sometimes the artery may be made wider using a laser or other tools to remove plaque.   When the blood flow is better, the catheter is removed. The lining of the artery will grow over the stent, which stays where it was placed.  AFTER THE PROCEDURE  If the procedure is done through the leg, you will be kept in bed lying flat for about 6 hours. You will be instructed to not bend or cross your legs.   The insertion site will be checked frequently.   The pulse in your feet or wrist will be checked frequently.   Additional blood tests, X-rays, and electrocardiography may be done. Document Released: 05/14/2003 Document Revised: 11/12/2013 Document Reviewed: 05/16/2013 [facility] Patient Information 2015 [place], [place]. This information is not intended to replace advice given to you by  your health care provider. Make sure you discuss any questions you have with your health care provider. Myocardial Infarction A myocardial infarction (MI) is damage to the heart that is not reversible. It is also called a heart attack. An MI usually occurs when a heart (coronary) artery becomes blocked or narrowed. This cuts off the blood supply to the heart. When one or more of the heart (coronary) arteries becomes blocked, that area of the heart begins to die. This causes pain felt during an MI.  If you think you might be having an MI, call your local emergency services immediately (911 in [place] It is recommended that you chew and swallow 3 non-enteric coated baby aspirin if you do not have an aspirin allergy. Do not drive yourself to the hospital or wait to see if your symptoms go away. The sooner MI is treated, the greater the amount of heart muscle saved. Time is muscle. It can save your life. CAUSES  An MI can occur from:  A gradual buildup of a fatty substance called plaque. When plaque builds up in the arteries, this condition is called atherosclerosis. This buildup can block or reduce the blood supply to the heart artery(s).  A sudden plaque rupture within a heart artery that causes a blood clot (thrombus). A blood clot can block the heart artery which does not allow blood flow to the heart.  A severe tightening (spasm) of the heart artery. This is a less common cause of a heart attack. When a heart artery spasms, it cuts off blood flow through the artery. Spasms can occur in heart arteries that do not have atherosclerosis. RISK FACTORS People at risk for an MI usually have one or more risk factors, such as:  High blood pressure.  High cholesterol.  Smoking.  Gender. Men have a higher heart attack risk.  Overweight/obesity.  Age.  Family history.  Lack of exercise.  Diabetes.  Stress.  Excessive alcohol use.  Street drug use (cocaine and methamphetamines). SYMPTOMS    MI symptoms can vary, such as:  In both men and women, MI symptoms can include the following:  Chest pain. The chest pain may feel like a crushing, squeezing, or "pressure" type feeling. MI pain can be "referred," meaning pain can be caused in one part of the body but felt in another part of the body. Referred MI pain may occur in the left arm, neck, or jaw. Pain may even be felt in the right arm.  Shortness of breath (dyspnea).  Heartburn or indigestion with or without vomiting, shortness of breath, or sweating (diaphoresis).  Sudden, cold sweats.  Sudden lightheadedness.  Upper back pain.  Women can have unique MI symptoms, such as:  Unexplained feelings of nervousness or anxiety.  Discomfort between the shoulder blades (scapula) or upper back.  Tingling in the hands and arms.  In elderly people (regardless of gender), MI symptoms can be subtle, such as:  Sweating (diaphoresis).  Shortness of  breath (dyspnea).  General tiredness (fatigue) or not feeling well (malaise). DIAGNOSIS  Diagnosis of an MI involves several tests such as:  An assessment of your vital signs such as heart rhythm, blood pressure, respiratory rate, and oxygen level.  An EKG (ECG) to look at the electrical activity of your heart.  Blood tests called cardiac markers are drawn at scheduled times to measure proteins or enzymes released by the damaged heart muscle.  A chest X-ray.  An echocardiogram to evaluate heart motion and blood flow.  Coronary angiography (cardiac catheterization). This is a diagnostic procedure to look at the heart arteries. TREATMENT  Acute Intervention. For an MI, the national standard in the [place] is to have an acute intervention in under 90 minutes from the time you get to the hospital. An acute intervention is a special procedure to open up the heart arteries. It is done in a treatment room called a "catheterization lab" (cath lab). Some hospitals do no have a cath  lab. If you are having an MI and the hospital does not have a cath lab, the standard is to transport you to a hospital that has one. In the cath lab, acute intervention includes:  Angioplasty. An angioplasty involves inserting a thin, flexible tube (catheter) into an artery in either your groin or wrist. The catheter is threaded to the heart arteries. A balloon at the end of the catheter is inflated to open a narrowed or blocked heart artery. During an angioplasty procedure, a small mesh tube (stent) may be used to keep the heart artery open. Depending on your condition and health history, one of two types of stents may be placed:  Drug-eluting stent (DES). A DES is coated with a medicine to prevent scar tissue from growing over the stent. With drug-eluting stents, blood thinning medicine will need to be taken for up to a year.  Bare metal stent. This type of stent has no special coating to keep tissue from growing over it. This type of stent is used if you cannot take blood thinning medicine for a prolonged time or you need surgery in the near future. After a bare metal stent is placed, blood thinning medicine will need to be taken for about a month.  If you are taking blood thinning medicine (anti-platelet therapy) after stent placement, do not stop taking it unless your caregiver says it is okay to do so. Make sure you understand how long you need to take the medicine. Surgical Intervention  If an acute intervention is not successful, surgery may be needed:  Open heart surgery (coronary artery bypass graft, CABG). CABG takes a vein (saphenous vein) from your leg. The vein is then attached to the blocked heart artery which bypasses the blockage. This then allows blood flow to the heart muscle. Additional Interventions  A "clot buster" medicine (thrombolytic) may be given. This medicine can help break up a clot in the heart artery. This medicine may be given if a person cannot get to a cath lab  right away.  Intra-aortic balloon pump (IABP). If you have suffered a very severe MI and are too unstable to go to the cath lab or to surgery, an IABP may be used. This is a temporary mechanical device used to increase blood flow to the heart and reduce the workload of the heart until you are stable enough to go to the cath lab or surgery. HOME CARE INSTRUCTIONS After an MI, you may need the following:  Medicine. Take medicine  as directed by your caregiver. Medicines after an MI may:  Keep your blood from clotting easily (blood thinners).  Control your blood pressure.  Help lower your cholesterol.  Control abnormal heart rhythms.  Lifestyle changes. Under the guidance of your caregiver, lifestyle changes include:  Quitting smoking, if you smoke. Your caregiver can help you quit.  Being physically active.  Maintaining a healthy weight.  Eating a heart healthy diet. A dietitian can help you learn healthy eating options.  Managing diabetes.  Reducing stress.  Limiting alcohol intake. SEEK IMMEDIATE MEDICAL CARE IF:   You have severe chest pain, especially if the pain is crushing or pressure-like and spreads to the arms, back, neck, or jaw. This is an emergency. Do not wait to see if the pain will go away. Get medical help at once. Call your local emergency services (911 in the [place] Do not drive yourself to the hospital.  You have shortness of breath during rest, sleep, or with activity.  You have sudden sweating or clammy skin.  You feel sick to your stomach (nauseous) and throw up (vomit).  You suddenly become lightheaded or dizzy.  You feel your heart beating rapidly or you notice "skipped" beats. MAKE SURE YOU:   Understand these instructions.  Will watch your condition.  Will get help right away if you are not doing well or get worse. Document Released: 11/07/2005 Document Revised: 11/12/2013 Document Reviewed: 04/12/2011 [facility] Patient Information 2015  [place], [place]. This information is not intended to replace advice given to you by your health care provider. Make sure you discuss any questions you have with your health care provider.

## 2014-06-08 NOTE — Progress Notes (Signed)
SUBJECTIVE:  Has had a few episodes of chest pain which she says are not very bad  OBJECTIVE:   Vitals:   Filed Vitals:   06/07/14 0932 06/07/14 1403 06/07/14 2023 06/08/14 0500  BP: 128/60 107/50 108/52 118/59  Pulse: 64 66 73 70  Temp: 98 F (36.7 C) 98.1 F (36.7 C) 98 F (36.7 C) 98.8 F (37.1 C)  TempSrc: Oral Oral Oral Oral  Resp: 16 18 18 18   Height:      Weight:      SpO2: 95% 99% 99% 100%   I&O's:   Intake/Output Summary (Last 24 hours) at 06/08/14 0736 Last data filed at 06/07/14 1800  Gross per 24 hour  Intake    900 ml  Output      0 ml  Net    900 ml   TELEMETRY: Reviewed telemetry pt in NSR:     PHYSICAL EXAM General: Well developed, well nourished, in no acute distress Head: Eyes PERRLA, No xanthomas.   Normal cephalic and atramatic  Lungs:   Clear bilaterally to auscultation and percussion. Heart:   HRRR S1 S2 Pulses are 2+ & equal. Abdomen: Bowel sounds are positive, abdomen soft and non-tender without masses  Extremities:   No clubbing, cyanosis or edema.  DP +1 Neuro: Alert and oriented X 3. Psych:  Good affect, responds appropriately   LABS: Basic Metabolic Panel:  Recent Labs  06/05/14 0815  06/06/14 1440 06/06/14 1958 06/07/14 0330  NA  --   < > 138  --  141  K  --   < > 5.0 4.5 4.2  CL  --   < > 105  --  106  CO2  --   < > 17*  --  21  GLUCOSE  --   < > 219*  --  164*  BUN  --   < > 10  --  10  CREATININE  --   < > 1.05  --  1.17*  CALCIUM 8.2*  < > 8.2*  --  8.8  PHOS 3.2  --   --   --   --   < > = values in this interval not displayed. Liver Function Tests: No results found for this basename: AST, ALT, ALKPHOS, BILITOT, PROT, ALBUMIN,  in the last 72 hours No results found for this basename: LIPASE, AMYLASE,  in the last 72 hours CBC:  Recent Labs  06/07/14 0903 06/08/14 0420  WBC 11.2* 9.0  HGB 13.7 11.8*  HCT 41.0 35.5*  MCV 93.4 92.7  PLT 217 196   Cardiac Enzymes:  Recent Labs  06/06/14 1440 06/06/14 2040  06/07/14 0330  TROPONINI 18.49* >20.00* >20.00*   BNP: No components found with this basename: POCBNP,  D-Dimer: No results found for this basename: DDIMER,  in the last 72 hours Hemoglobin A1C: No results found for this basename: HGBA1C,  in the last 72 hours Fasting Lipid Panel: No results found for this basename: CHOL, HDL, LDLCALC, TRIG, CHOLHDL, LDLDIRECT,  in the last 72 hours Thyroid Function Tests:  Recent Labs  06/07/14 0903  TSH 0.057*   Anemia Panel: No results found for this basename: VITAMINB12, FOLATE, FERRITIN, TIBC, IRON, RETICCTPCT,  in the last 72 hours Coag Panel:   Lab Results  Component Value Date   INR 1.06 06/05/2014   INR 2.02* 06/05/2014   INR 1.06 06/04/2014    RADIOLOGY: Dg Chest Portable 1 View  06/04/2014   CLINICAL DATA:  Chest pain. Coronary  artery disease. Breast carcinoma.  EXAM: PORTABLE CHEST - 1 VIEW  COMPARISON:  03/12/2012  FINDINGS: The heart size and mediastinal contours are within normal limits. Both lungs are clear. No evidence of pleural effusion. No mass or lymphadenopathy identified. Prior CABG again noted. Prior left mastectomy noted. The visualized skeletal structures are unremarkable.  IMPRESSION: No active disease.   Electronically Signed   By: Earle Gell M.D.   On: 06/04/2014 18:17   ASSESSMENT AND PLAN  [name] is a 79 y.o.female hx of CAD with 3 vessel CABG in 07/2003, LVEF 50-55% by echo, renal artery stenosis with prior stenting, HTN, hx of CVA, DM, breast CA s/p left mastectomy/radiation/chemo, CKD, and hyperlipidemia who presented to [facility] on 06/04/14 with chest pain and NSTEMI.  NSTEMI  -- She underwent LHC on 06/05/14 with DES to native LCX, PCTA to the anastomosis of SVG to D1 and DES to proximal. Patent LIMA to mLAD, patent SVG-rRCA, 90% ostial rPDA. There was TIMI 2 flow in the SVG--> Diag at the end of the case. This may explain her continued elevated troponin and intermittent CP -- Her troponin was >20 and remained there  until 06/06/14 when it went down to 18.4. It then increased back up to >20 . Wait for enzymes to start trending downwards before discharge.  -- continue metoprolol to 25mg  BID. Continue ASA/Brilinta and Lipitor 80mg   -- add low dose long acting nitrates Chronic systolic congestive heart failure, EF 40-45%  -- Continue ACE and BB  Hypokalemia  -- Resolved 3.0-->4.2  after IV KCl repletion  Hypocalcemia  -- Resolved 6.2-->8.8 after repleted with 1 G CaGluconate  -- Vit D level normal at 36  Anemia  -- Hg 8.9 yesterday. Repeat CBC with Hg 11.8.  Hypertension - BPs have been stable  DM type 2  CKD, stage II  Hx of hypothyroidism - will check a TSH while inpatient. Not on synthroid  Hyperlipidemia  History of DVT (deep vein thrombosis)    Crystal Margarita, MD  06/08/2014  7:36 AM

## 2014-06-09 ENCOUNTER — Other Ambulatory Visit: Payer: Self-pay | Admitting: *Deleted

## 2014-06-09 DIAGNOSIS — I2510 Atherosclerotic heart disease of native coronary artery without angina pectoris: Secondary | ICD-10-CM

## 2014-06-09 LAB — VITAMIN D 1,25 DIHYDROXY
VITAMIN D 1, 25 (OH) TOTAL: 34 pg/mL (ref 18–72)
Vitamin D3 1, 25 (OH)2: 34 pg/mL

## 2014-06-10 NOTE — Discharge Summary (Signed)
Agree with discharge summary as outlined by Kerin Ransom PA-C.  Of note, after the PA completed discharge papers the nurse tech posted another BP that was low but no provider was ever contacted and patient was discharged home.  Contacted pt at home and BP has been stable and patient is doing well.

## 2014-06-11 ENCOUNTER — Telehealth: Payer: Self-pay | Admitting: Cardiovascular Disease

## 2014-06-11 ENCOUNTER — Emergency Department (HOSPITAL_COMMUNITY): Payer: Medicare Other

## 2014-06-11 ENCOUNTER — Telehealth: Payer: Self-pay | Admitting: Cardiology

## 2014-06-11 ENCOUNTER — Encounter (HOSPITAL_COMMUNITY): Payer: Self-pay | Admitting: Emergency Medicine

## 2014-06-11 ENCOUNTER — Emergency Department (HOSPITAL_COMMUNITY)
Admission: EM | Admit: 2014-06-11 | Discharge: 2014-06-11 | Disposition: A | Discharge status: Home / Self Care | Payer: Medicare Other | Attending: Emergency Medicine | Admitting: Emergency Medicine

## 2014-06-11 DIAGNOSIS — E1165 Type 2 diabetes mellitus with hyperglycemia: Secondary | ICD-10-CM | POA: Diagnosis not present

## 2014-06-11 DIAGNOSIS — Z860100 Personal history of colon polyps, unspecified: Secondary | ICD-10-CM | POA: Insufficient documentation

## 2014-06-11 DIAGNOSIS — Z853 Personal history of malignant neoplasm of breast: Secondary | ICD-10-CM | POA: Diagnosis not present

## 2014-06-11 DIAGNOSIS — E118 Type 2 diabetes mellitus with unspecified complications: Secondary | ICD-10-CM

## 2014-06-11 DIAGNOSIS — Z86718 Personal history of other venous thrombosis and embolism: Secondary | ICD-10-CM | POA: Diagnosis not present

## 2014-06-11 DIAGNOSIS — R5383 Other fatigue: Secondary | ICD-10-CM | POA: Diagnosis present

## 2014-06-11 DIAGNOSIS — Z9861 Coronary angioplasty status: Secondary | ICD-10-CM

## 2014-06-11 DIAGNOSIS — Z7982 Long term (current) use of aspirin: Secondary | ICD-10-CM | POA: Insufficient documentation

## 2014-06-11 DIAGNOSIS — R5381 Other malaise: Secondary | ICD-10-CM | POA: Diagnosis not present

## 2014-06-11 DIAGNOSIS — IMO0002 Reserved for concepts with insufficient information to code with codable children: Secondary | ICD-10-CM | POA: Insufficient documentation

## 2014-06-11 DIAGNOSIS — Z8739 Personal history of other diseases of the musculoskeletal system and connective tissue: Secondary | ICD-10-CM | POA: Diagnosis not present

## 2014-06-11 DIAGNOSIS — I2510 Atherosclerotic heart disease of native coronary artery without angina pectoris: Secondary | ICD-10-CM | POA: Diagnosis not present

## 2014-06-11 DIAGNOSIS — R531 Weakness: Secondary | ICD-10-CM | POA: Diagnosis present

## 2014-06-11 DIAGNOSIS — Z8673 Personal history of transient ischemic attack (TIA), and cerebral infarction without residual deficits: Secondary | ICD-10-CM | POA: Diagnosis not present

## 2014-06-11 DIAGNOSIS — Z794 Long term (current) use of insulin: Secondary | ICD-10-CM | POA: Insufficient documentation

## 2014-06-11 DIAGNOSIS — Z951 Presence of aortocoronary bypass graft: Secondary | ICD-10-CM | POA: Diagnosis not present

## 2014-06-11 DIAGNOSIS — I10 Essential (primary) hypertension: Secondary | ICD-10-CM | POA: Diagnosis present

## 2014-06-11 DIAGNOSIS — I5042 Chronic combined systolic (congestive) and diastolic (congestive) heart failure: Secondary | ICD-10-CM | POA: Insufficient documentation

## 2014-06-11 DIAGNOSIS — Z79899 Other long term (current) drug therapy: Secondary | ICD-10-CM | POA: Diagnosis not present

## 2014-06-11 DIAGNOSIS — F0390 Unspecified dementia without behavioral disturbance: Secondary | ICD-10-CM | POA: Diagnosis not present

## 2014-06-11 DIAGNOSIS — N182 Chronic kidney disease, stage 2 (mild): Secondary | ICD-10-CM | POA: Diagnosis not present

## 2014-06-11 DIAGNOSIS — I129 Hypertensive chronic kidney disease with stage 1 through stage 4 chronic kidney disease, or unspecified chronic kidney disease: Secondary | ICD-10-CM | POA: Insufficient documentation

## 2014-06-11 LAB — CBC WITH DIFFERENTIAL/PLATELET
BASOS ABS: 0 10*3/uL (ref 0.0–0.1)
BASOS PCT: 0 % (ref 0–1)
EOS ABS: 0.2 10*3/uL (ref 0.0–0.7)
Eosinophils Relative: 2 % (ref 0–5)
HCT: 35.7 % — ABNORMAL LOW (ref 36.0–46.0)
Hemoglobin: 12.1 g/dL (ref 12.0–15.0)
Lymphocytes Relative: 14 % (ref 12–46)
Lymphs Abs: 1.2 10*3/uL (ref 0.7–4.0)
MCH: 30.9 pg (ref 26.0–34.0)
MCHC: 33.9 g/dL (ref 30.0–36.0)
MCV: 91.3 fL (ref 78.0–100.0)
Monocytes Absolute: 0.8 10*3/uL (ref 0.1–1.0)
Monocytes Relative: 10 % (ref 3–12)
NEUTROS PCT: 74 % (ref 43–77)
Neutro Abs: 6.1 10*3/uL (ref 1.7–7.7)
PLATELETS: 257 10*3/uL (ref 150–400)
RBC: 3.91 MIL/uL (ref 3.87–5.11)
RDW: 13 % (ref 11.5–15.5)
WBC: 8.2 10*3/uL (ref 4.0–10.5)

## 2014-06-11 LAB — URINALYSIS, ROUTINE W REFLEX MICROSCOPIC
Bilirubin Urine: NEGATIVE
Glucose, UA: NEGATIVE mg/dL
HGB URINE DIPSTICK: NEGATIVE
Ketones, ur: NEGATIVE mg/dL
LEUKOCYTES UA: NEGATIVE
NITRITE: NEGATIVE
PROTEIN: NEGATIVE mg/dL
Specific Gravity, Urine: 1.012 (ref 1.005–1.030)
UROBILINOGEN UA: 1 mg/dL (ref 0.0–1.0)
pH: 5.5 (ref 5.0–8.0)

## 2014-06-11 LAB — TSH: TSH: 0.551 u[IU]/mL (ref 0.350–4.500)

## 2014-06-11 LAB — BASIC METABOLIC PANEL
ANION GAP: 15 (ref 5–15)
BUN: 15 mg/dL (ref 6–23)
CHLORIDE: 100 meq/L (ref 96–112)
CO2: 23 mEq/L (ref 19–32)
Calcium: 9.1 mg/dL (ref 8.4–10.5)
Creatinine, Ser: 1.37 mg/dL — ABNORMAL HIGH (ref 0.50–1.10)
GFR, EST AFRICAN AMERICAN: 42 mL/min — AB (ref >90)
GFR, EST NON AFRICAN AMERICAN: 36 mL/min — AB (ref >90)
Glucose, Bld: 153 mg/dL — ABNORMAL HIGH (ref 70–99)
Potassium: 4.2 mEq/L (ref 3.7–5.3)
SODIUM: 138 meq/L (ref 137–147)

## 2014-06-11 LAB — TROPONIN I: Troponin I: 2.35 ng/mL (ref <0.30)

## 2014-06-11 LAB — PRO B NATRIURETIC PEPTIDE: Pro B Natriuretic peptide (BNP): 2638 pg/mL — ABNORMAL HIGH (ref 0–450)

## 2014-06-11 NOTE — ED Notes (Signed)
Family at bedside. 

## 2014-06-11 NOTE — Telephone Encounter (Signed)
Pt need an order for her THS and diagnosis code please.

## 2014-06-11 NOTE — ED Notes (Signed)
Pollina, MD at bedside 

## 2014-06-11 NOTE — Discharge Instructions (Signed)
You have been seen in the ED for your complaint of weakness. The cardiologist have also evaluated you and feel that you are safe to go home. Please read the information below for reasons to return to the Ed immediately. Do not take your lasix until you have you follow up appointment or you call and discuss taking it with your cardiologist.  Weakness Weakness is a lack of strength. It may be felt all over the body (generalized) or in one specific part of the body (focal). Some causes of weakness can be serious. You may need further medical evaluation, especially if you are elderly or you have a history of immunosuppression (such as chemotherapy or HIV), kidney disease, heart disease, or diabetes. CAUSES  Weakness can be caused by many different things, including:  Infection.  Physical exhaustion.  Internal bleeding or other blood loss that results in a lack of red blood cells (anemia).  Dehydration. This cause is more common in elderly people.  Side effects or electrolyte abnormalities from medicines, such as pain medicines or sedatives.  Emotional distress, anxiety, or depression.  Circulation problems, especially severe peripheral arterial disease.  Heart disease, such as rapid atrial fibrillation, bradycardia, or heart failure.  Nervous system disorders, such as Guillain-Barr syndrome, multiple sclerosis, or stroke. DIAGNOSIS  To find the cause of your weakness, your caregiver will take your history and perform a physical exam. Lab tests or X-rays may also be ordered, if needed. TREATMENT  Treatment of weakness depends on the cause of your symptoms and can vary greatly. HOME CARE INSTRUCTIONS   Rest as needed.  Eat a well-balanced diet.  Try to get some exercise every day.  Only take over-the-counter or prescription medicines as directed by your caregiver. SEEK MEDICAL CARE IF:   Your weakness seems to be getting worse or spreads to other parts of your body.  You develop  new aches or pains. SEEK IMMEDIATE MEDICAL CARE IF:   You cannot perform your normal daily activities, such as getting dressed and feeding yourself.  You cannot walk up and down stairs, or you feel exhausted when you do so.  You have shortness of breath or chest pain.  You have difficulty moving parts of your body.  You have weakness in only one area of the body or on only one side of the body.  You have a fever.  You have trouble speaking or swallowing.  You cannot control your bladder or bowel movements.  You have black or bloody vomit or stools. MAKE SURE YOU:  Understand these instructions.  Will watch your condition.  Will get help right away if you are not doing well or get worse. Document Released: 11/07/2005 Document Revised: 05/08/2012 Document Reviewed: 01/06/2012 [facility] Patient Information 2015 [place], [place]. This information is not intended to replace advice given to you by your health care provider. Make sure you discuss any questions you have with your health care provider.

## 2014-06-11 NOTE — ED Notes (Signed)
Cardiologist at bedside.  

## 2014-06-11 NOTE — ED Notes (Signed)
Attempted IV and lab draw unsuccessful. 

## 2014-06-11 NOTE — Telephone Encounter (Signed)
Order placed for [facility] and faxed to [facility] at Dr. Onnie Boer office [phone].

## 2014-06-11 NOTE — Telephone Encounter (Signed)
Pt was discharged from the hospital on Sunday. She need to go over her medicine with you.She also would like to know the status of the home health nurse order that [facility] placed.

## 2014-06-11 NOTE — Telephone Encounter (Signed)
Original referral to [facility].  Spoke with Gwinda Passe today they are unable to accept this referral due to short staffed and insurance.  Suggested I contact [facility] [phone].  Called and faxed [phone]) referral and they will assess and let me kow what they can do.

## 2014-06-11 NOTE — Telephone Encounter (Signed)
[facility] will start the home visits Thursday.

## 2014-06-11 NOTE — ED Notes (Signed)
Pt called EMS due to "not feeling herself." Pt denied chest pain or any respiratory difficulty. Pt is just feeling week. Pt has h/o AMS. Pt alert and oriented x3, unknown year. Pt has h/o cardiac problemss. Pt had 4x cardiac stents placed last week and was discharged from hospital on Sunday. Pt was told to call 911 if she was experiencing any signs and symptoms. No meds were given in route. VS P80, 98%, BP120/ 68, normal sinus on monitor. CBG 194.

## 2014-06-11 NOTE — ED Provider Notes (Signed)
CSN: [id number]     Arrival date & time 06/11/14  1209 History   First MD Initiated Contact with Patient 06/11/14 1213     No chief complaint on file.    (Consider location/radiation/quality/duration/timing/severity/associated sxs/prior Treatment) HPI [name] This 79 year old female with a past medical history heart failure, coronary artery disease, history of breast cancer, vascular disease and chronic kidney disease. The patient was discharged from the hospital 06/08/2014 for her NSTEMI. According to her daughter, she had 6 vessels that were occluded,. 3 caths and 1 balloon angio. Review of EMR shows Troponins elevated > than 20 Dipped to 18.49, back up to > 20. Patient was discharged with a troponin of 13.49. The patient was also found to have a low thyroid-stimulating hormone and her levothyroxine was withheld. The patient presents to the emergency department for weakness. She states that since she was discharged she has been becoming weaker and weaker, she's having difficulty completing her activities of daily living and today was too tired to get out of her bed to shower. Her daughter called the cardiology office and was referred to the ED. The patient denies fevers, chills, myalgias. She denies swelling in her ankles, shortness of breath, orthopnea. She denies any chest pain, abdominal pain, nausea, vomiting, melena or hematochezia. The patient denies any urinary symptoms. She also denies focal neurologic symptoms. She complains of overall malaise and weakness. Patient is on brillinta after stent placement  Past Medical History  Diagnosis Date  . Chronic combined systolic and diastolic congestive heart failure, NYHA class 2     Mixed. 2D-echocardiogram (08/2010) - 35% to 40%. Diffuse hypokinesis. Grade 2 diastolic dysfunction.  [name] CAD (coronary artery disease), native coronary artery 2004    s/p 3-vessel CABG (07/2003), s/p NSTEMI (06/2009), LHC (11/2010) - 40% EF with anterior and  distal inferior wall motion abnormalities, likely secondary to Adriamycin. LAD stent in proximal region 90% occluded, better from prior 100% occlusion in 2008. RCA diffusely diseased with areas of 60, 70, 80% narrowing - by Dr. Chase Picket.  . 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 CAD S/P percutaneous coronary angioplasty 06/05/2014    DES to Prox Cx & SVG-D1; PTCA of Anastomotic SVG-D1 lesion  . History of CVA (cerebrovascular accident)     left frontal lobe stroke (11/2009), TIA (06/2009, 04/2009) with multiple prior TIAs manifested by aphasia. H/o CVA in 1988 involving right eye.  [name] History of renal artery stenosis     Left 95% stenosis s/p left renal artery stenting (09/2009), right renal artery with 50% stenosis.  [name] History of breast cancer     Primary oncologist Dr. Brigitte Pulse. // First diagnosed 2000, recurrence in 06/2010. S/P left mastectomy with radiation therapy (2000), following recurrence, started chemothearpy, completed course (06/2010.)  . CKD (chronic kidney disease) stage 2, GFR 60-89 ml/min     BL Cr 1.6  . Hypothyroidism   . DJD (degenerative joint disease) of knee     left  . Hx of adenomatous colonic polyps     remote history. Last colonoscopy (03/2004) - Normal colonoscopy to the cecum, rec 5 year follow-up colonoscopy.  [name] History of DVT of lower extremity 07/2003    in setting of saphenous vein grafting for CABG  . Dementia   . Essential hypertension 03/12/2012  . DM (diabetes mellitus), type 2, uncontrolled with complications DX: [id number]    Stopped of  Metformin in 04/2011 in setting of AKI (Cr 3), now on insulin.   Past Surgical History  Procedure Laterality Date  . Coronary artery bypass graft  07/2003  . Total knee arthroplasty  12/2004, 12/2005    BL, right (2006) by Dr. Berenice Primas. Left (2007) by Dr. Ronnie Derby.  . Simple mastectomy  2000    left, in  setting of breast cancer  . Total abdominal hysterectomy    . Lumbar disc surgery     Family History  Problem Relation Age of Onset  . Heart disease Mother   . Diabetes Mother   . Heart disease Father   . Cancer Father     colon cancer  . Heart disease Sister   . Heart disease Brother   . Cancer Brother     prostate cancer  . Heart disease Brother   . Cancer Sister     breast cancer   History  Substance Use Topics  . Smoking status: Never Smoker   . Smokeless tobacco: Never Used  . Alcohol Use: No   OB History   Grav Para Term Preterm Abortions TAB SAB Ect Mult Living                 Review of Systems  Ten systems reviewed and are negative for acute change, except as noted in the HPI.    Allergies  Review of patient's allergies indicates no known allergies.  Home Medications   Prior to Admission medications   Medication Sig Start Date End Date Taking? Authorizing Provider  aspirin EC 81 MG EC tablet Take 1 tablet (81 mg total) by mouth daily. 06/08/14  Yes Luke K Kilroy, PA-C  atorvastatin (LIPITOR) 80 MG tablet Take 1 tablet (80 mg total) by mouth daily at 6 PM. 06/08/14  Yes Erlene Quan, PA-C  insulin glargine (LANTUS) 100 UNIT/ML injection Inject 80 Units into the skin at bedtime.   Yes Historical Provider, MD  isosorbide mononitrate (IMDUR) 15 mg TB24 24 hr tablet Take 0.5 tablets (15 mg total) by mouth daily. 06/08/14  Yes Luke K Kilroy, PA-C  lisinopril (PRINIVIL,ZESTRIL) 5 MG tablet Take 1 tablet (5 mg total) by mouth daily. 06/08/14  Yes Luke K Kilroy, PA-C  metoprolol tartrate (LOPRESSOR) 25 MG tablet Take 25 mg by mouth 2 (two) times daily.   Yes Historical Provider, MD  ticagrelor (BRILINTA) 90 MG TABS tablet Take 1 tablet (90 mg total) by mouth 2 (two) times daily. 06/08/14  Yes Luke K Kilroy, PA-C  nitroGLYCERIN (NITROSTAT) 0.4 MG SL tablet Place 1 tablet (0.4 mg total) under the tongue every 5 (five) minutes x 3 doses as needed for chest pain. 06/08/14    Luke K Kilroy, PA-C   BP 103/62  Pulse 67  Temp(Src) 98.3 F (36.8 C) (Oral)  Resp 11  SpO2 100% Physical Exam  Nursing note and vitals reviewed. Constitutional: She is oriented to person, place, and time. She appears well-developed and well-nourished. No distress.  HENT:  Head: Normocephalic and atraumatic.  Eyes: Conjunctivae and EOM are normal. Pupils are equal, round, and reactive to light. No scleral icterus.  Neck: Normal range of motion.  Cardiovascular: Normal rate, regular rhythm and normal heart sounds.  Exam reveals no gallop and no friction rub.   No murmur heard. Pulmonary/Chest: Effort normal and breath sounds normal. No respiratory distress.  Abdominal: Soft. Bowel sounds are normal. She exhibits no distension and no mass. There is no tenderness. There is no guarding.  Musculoskeletal: Normal  range of motion.  Neurological: She is alert and oriented to person, place, and time. She has normal reflexes.  Skin: Skin is warm and dry. She is not diaphoretic. There is pallor.    ED Course  Procedures (including critical care time) Labs Review Labs Reviewed  CBC WITH DIFFERENTIAL  BASIC METABOLIC PANEL  URINALYSIS, ROUTINE W REFLEX MICROSCOPIC  TROPONIN I  PRO B NATRIURETIC PEPTIDE  TSH    Imaging Review Dg Chest 2 View  06/11/2014   CLINICAL DATA:  Generalized body aches and weakness. History of breast cancer.  EXAM: CHEST  2 VIEW  COMPARISON:  06/04/2014 and 03/12/2012  FINDINGS: Heart size and pulmonary vascularity are normal and the lungs are clear. Minimal elevation of the left hemidiaphragm. No effusions. No acute osseous abnormality.  CABG.  Numerous coronary artery stents.  IMPRESSION: No acute abnormalities.   Electronically Signed   By: Rozetta Nunnery M.D.   On: 06/11/2014 13:22     EKG Interpretation   Date/Time:  Wednesday June 11 2014 12:22:01 EDT Ventricular Rate:  82 PR Interval:  124 QRS Duration: 119 QT Interval:  380 QTC Calculation: 444 R  Axis:   -51 Text Interpretation:  Ectopic atrial rhythm Incomplete left bundle branch  block LVH with secondary repolarization abnormality (more pronounced than  last tracing) Confirmed by POLLINA  MD, CHRISTOPHER [phone]) on 06/11/2014  12:25:41 PM      MDM   Final diagnoses:  Weakness    2:22 PM Filed Vitals:   06/11/14 1224 06/11/14 1245 06/11/14 1300 06/11/14 1345  BP: 114/75 112/74 116/62 103/62  Pulse: 84 91 81 67  Temp: 98.3 F (36.8 C)     TempSrc: Oral     Resp: 13 20 17 11   SpO2: 100% 100% 100% 100%    Patient with weakness after her admission for NSTEMI. No si/sx of heartfailure or infection at this time I have ordered labs.  Her cxr is without acute abormalituy and EKG  Shows more pronounced repol. NAD at this time/  4:26 PM I have Spoken with PA RHonda Barrett. They will see and evaluate the patient.   5:55 PM BP 140/66  Pulse 76  Temp(Src) 98.3 F (36.8 C) (Oral)  Resp 16  SpO2 100% Patient seen and evaluated by cardiology They feel that her weakness Is secondary to overuse of her lasix . They will let her go home and follow up with cardiology.Margarita Mail, PA-C 06/11/14 1810

## 2014-06-11 NOTE — H&P (Signed)
[name] is an 79 y.o. female.   Chief Complaint: Weak HPI:   [name] is a 79 y.o.female hx of CAD with 3 vessel CABG in 07/2003, LVEF 50-55% by echo, renal artery stenosis with prior stenting, HTN, hx of CVA, DM, breast CA s/p left mastectomy/radiation/chemo, CKD, hyperlipidemia.   She was admitted with chest pain 06/04/14.  Pain lasted for several hours, better with NTG. Last cath in our system Jan 2012:LVEF 40%, LM normal, LCX small patent vessel, LAD prox stent with 90% ISR, RCA prox 80%. SVG- RCA patent, SVG-diag patent, LIMA-LAD patent. Pt was admitted, started on Heparin and NTG. She ruled in for an MI by Troponin. She underwent cath with successful PCI DES of native CFX and SVG-Dx1. EF reported as moderately reduced.  At discharge it was noted that her TSH was low. Her synthroid has been held and we have instructed the pt's daughter to follow up this week ith her primary care provider. Her B/P was noted to be low at discharge.   The patient presents today with weakness.  She says she was feeling good but then got weak today.  Her daughter also noticed improvement prior to today.  The patient currently denies nausea, vomiting, fever, chest pain, shortness of breath, orthopnea, dizziness, PND, cough, congestion, abdominal pain, hematochezia, melena, lower extremity edema.    Medications:  acetaminophen 325 MG tablet   Commonly known as: TYLENOL   Take 2 tablets (650 mg total) by mouth every 4 (four) hours as needed for headache or mild pain.    allopurinol 100 MG tablet   Commonly known as: ZYLOPRIM   Take 100 mg by mouth daily as needed (for gout).    aspirin 81 MG EC tablet   Take 1 tablet (81 mg total) by mouth daily.    atorvastatin 80 MG tablet   Commonly known as: LIPITOR   Take 1 tablet (80 mg total) by mouth daily at 6 PM.    cyclobenzaprine 5 MG tablet   Commonly known as: FLEXERIL   Take 1 tablet (5 mg total) by mouth 3 (three) times daily as needed for  muscle spasms (back pain).    furosemide 20 MG tablet   Commonly known as: LASIX   Take 20 mg by mouth daily as needed. Swelling.    insulin glargine 100 UNIT/ML injection   Commonly known as: LANTUS   Inject 80 Units into the skin at bedtime.    isosorbide mononitrate 15 mg Tb24 24 hr tablet   Commonly known as: IMDUR   Take 0.5 tablets (15 mg total) by mouth daily.    lisinopril 5 MG tablet   Commonly known as: PRINIVIL,ZESTRIL   Take 1 tablet (5 mg total) by mouth daily.    LORazepam 0.5 MG tablet   Commonly known as: ATIVAN   Take 0.5 mg by mouth at bedtime as needed for sleep.    metoprolol tartrate 25 MG tablet   Commonly known as: LOPRESSOR   Take 25 mg by mouth 2 (two) times daily.    nitroGLYCERIN 0.4 MG SL tablet   Commonly known as: NITROSTAT   Place 1 tablet (0.4 mg total) under the tongue every 5 (five) minutes x 3 doses as needed for chest pain.    ticagrelor 90 MG Tabs tablet   Commonly known as: BRILINTA   Take 1 tablet (90 mg total) by mouth 2 (two) times daily.      Past Medical History  Diagnosis Date  . Chronic combined systolic and diastolic congestive heart failure, NYHA class 2     Mixed. 2D-echocardiogram (08/2010) - 35% to 40%. Diffuse hypokinesis. Grade 2 diastolic dysfunction.  [name] CAD (coronary artery disease), native coronary artery 2004    s/p 3-vessel CABG (07/2003), s/p NSTEMI (06/2009), LHC (11/2010) - 40% EF with anterior and distal inferior wall motion abnormalities, likely secondary to Adriamycin. LAD stent in proximal region 90% occluded, better from prior 100% occlusion in 2008. RCA diffusely diseased with areas of 60, 70, 80% narrowing - by Dr. Chase Picket.  . 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 CAD S/P percutaneous coronary angioplasty 06/05/2014    DES to Prox Cx & SVG-D1; PTCA of Anastomotic SVG-D1 lesion  .  History of CVA (cerebrovascular accident)     left frontal lobe stroke (11/2009), TIA (06/2009, 04/2009) with multiple prior TIAs manifested by aphasia. H/o CVA in 1988 involving right eye.  [name] History of renal artery stenosis     Left 95% stenosis s/p left renal artery stenting (09/2009), right renal artery with 50% stenosis.  [name] History of breast cancer     Primary oncologist Dr. Brigitte Pulse. // First diagnosed 2000, recurrence in 06/2010. S/P left mastectomy with radiation therapy (2000), following recurrence, started chemothearpy, completed course (06/2010.)  . CKD (chronic kidney disease) stage 2, GFR 60-89 ml/min     BL Cr 1.6  . Hypothyroidism   . DJD (degenerative joint disease) of knee     left  . Hx of adenomatous colonic polyps     remote history. Last colonoscopy (03/2004) - Normal colonoscopy to the cecum, rec 5 year follow-up colonoscopy.  [name] History of DVT of lower extremity 07/2003    in setting of saphenous vein grafting for CABG  . Dementia   . Essential hypertension 03/12/2012  . DM (diabetes mellitus), type 2, uncontrolled with complications DX: 1027    Stopped of Metformin in 04/2011 in setting of AKI (Cr 3), now on insulin.    Past Surgical History  Procedure Laterality Date  . Coronary artery bypass graft  07/2003  . Total knee arthroplasty  12/2004, 12/2005    BL, right (2006) by Dr. Berenice Primas. Left (2007) by Dr. Ronnie Derby.  . Simple mastectomy  2000    left, in setting of breast cancer  . Total abdominal hysterectomy    . Lumbar disc surgery      Family History  Problem Relation Age of Onset  . Heart disease Mother   . Diabetes Mother   . Heart disease Father   . Cancer Father     colon cancer  . Heart disease Sister   . Heart disease Brother   . Cancer Brother     prostate cancer  . Heart disease Brother   . Cancer Sister     breast cancer   Social History:  reports that she has never smoked. She has never used smokeless tobacco. She reports that she does  not drink alcohol or use illicit drugs.  Allergies: No Known Allergies   (Not in a hospital admission)  Results for orders placed during the hospital encounter of 06/11/14 (from the past 48 hour(s))  CBC WITH DIFFERENTIAL     Status: Abnormal   Collection Time    06/11/14  1:54 PM      Result Value Ref Range   WBC 8.2  4.0 -  10.5 K/uL   RBC 3.91  3.87 - 5.11 MIL/uL   Hemoglobin 12.1  12.0 - 15.0 g/dL   HCT 35.7 (*) 36.0 - 46.0 %   MCV 91.3  78.0 - 100.0 fL   MCH 30.9  26.0 - 34.0 pg   MCHC 33.9  30.0 - 36.0 g/dL   RDW 13.0  11.5 - 15.5 %   Platelets 257  150 - 400 K/uL   Neutrophils Relative % 74  43 - 77 %   Neutro Abs 6.1  1.7 - 7.7 K/uL   Lymphocytes Relative 14  12 - 46 %   Lymphs Abs 1.2  0.7 - 4.0 K/uL   Monocytes Relative 10  3 - 12 %   Monocytes Absolute 0.8  0.1 - 1.0 K/uL   Eosinophils Relative 2  0 - 5 %   Eosinophils Absolute 0.2  0.0 - 0.7 K/uL   Basophils Relative 0  0 - 1 %   Basophils Absolute 0.0  0.0 - 0.1 K/uL  BASIC METABOLIC PANEL     Status: Abnormal   Collection Time    06/11/14  1:54 PM      Result Value Ref Range   Sodium 138  137 - 147 mEq/L   Potassium 4.2  3.7 - 5.3 mEq/L   Chloride 100  96 - 112 mEq/L   CO2 23  19 - 32 mEq/L   Glucose, Bld 153 (*) 70 - 99 mg/dL   BUN 15  6 - 23 mg/dL   Creatinine, Ser 1.37 (*) 0.50 - 1.10 mg/dL   Calcium 9.1  8.4 - 10.5 mg/dL   GFR calc non Af Amer 36 (*) >90 mL/min   GFR calc Af Amer 42 (*) >90 mL/min   Comment: (NOTE)     The eGFR has been calculated using the CKD EPI equation.     This calculation has not been validated in all clinical situations.     eGFR's persistently <90 mL/min signify possible Chronic Kidney     Disease.   Anion gap 15  5 - 15  TROPONIN I     Status: Abnormal   Collection Time    06/11/14  1:54 PM      Result Value Ref Range   Troponin I 2.35 (*) <0.30 ng/mL   Comment:            Due to the release kinetics of cTnI,     a negative result within the first hours     of the  onset of symptoms does not rule out     myocardial infarction with certainty.     If myocardial infarction is still suspected,     repeat the test at appropriate intervals.     CRITICAL RESULT CALLED TO, READ BACK BY AND VERIFIED WITH:     E CORBETT,RN 1459 06/11/14 WBOND  PRO B NATRIURETIC PEPTIDE     Status: Abnormal   Collection Time    06/11/14  1:54 PM      Result Value Ref Range   Pro B Natriuretic peptide (BNP) 2638.0 (*) 0 - 450 pg/mL  URINALYSIS, ROUTINE W REFLEX MICROSCOPIC     Status: None   Collection Time    06/11/14  2:54 PM      Result Value Ref Range   Color, Urine YELLOW  YELLOW   APPearance CLEAR  CLEAR   Specific Gravity, Urine 1.012  1.005 - 1.030   pH 5.5  5.0 - 8.0  Glucose, UA NEGATIVE  NEGATIVE mg/dL   Hgb urine dipstick NEGATIVE  NEGATIVE   Bilirubin Urine NEGATIVE  NEGATIVE   Ketones, ur NEGATIVE  NEGATIVE mg/dL   Protein, ur NEGATIVE  NEGATIVE mg/dL   Urobilinogen, UA 1.0  0.0 - 1.0 mg/dL   Nitrite NEGATIVE  NEGATIVE   Leukocytes, UA NEGATIVE  NEGATIVE   Comment: MICROSCOPIC NOT DONE ON URINES WITH NEGATIVE PROTEIN, BLOOD, LEUKOCYTES, NITRITE, OR GLUCOSE <1000 mg/dL.   Dg Chest 2 View  06/11/2014   CLINICAL DATA:  Generalized body aches and weakness. History of breast cancer.  EXAM: CHEST  2 VIEW  COMPARISON:  06/04/2014 and 03/12/2012  FINDINGS: Heart size and pulmonary vascularity are normal and the lungs are clear. Minimal elevation of the left hemidiaphragm. No effusions. No acute osseous abnormality.  CABG.  Numerous coronary artery stents.  IMPRESSION: No acute abnormalities.   Electronically Signed   By: Rozetta Nunnery M.D.   On: 06/11/2014 13:22    Review of Systems  Constitutional: Negative for fever and diaphoresis.  HENT: Negative for congestion and sore throat.   Respiratory: Negative for cough and shortness of breath.   Cardiovascular: Negative for chest pain, palpitations, orthopnea, leg swelling and PND.  Gastrointestinal: Negative for  nausea, vomiting, abdominal pain, blood in stool and melena.  Genitourinary: Negative for hematuria.  Musculoskeletal: Negative for myalgias.  Neurological: Positive for weakness. Negative for dizziness.  All other systems reviewed and are negative.   Blood pressure 113/72, pulse 72, temperature 98.3 F (36.8 C), temperature source Oral, resp. rate 24, SpO2 100.00%. Physical Exam  Nursing note and vitals reviewed. Constitutional: She is oriented to person, place, and time. She appears well-developed and well-nourished. No distress.  HENT:  Head: Normocephalic and atraumatic.  Mouth/Throat: No oropharyngeal exudate.  Eyes: EOM are normal. Pupils are equal, round, and reactive to light.  Neck: Normal range of motion. Neck supple. No JVD present.  Cardiovascular: Normal rate, regular rhythm, S1 normal and S2 normal.   Pulses:      Radial pulses are 2+ on the right side, and 2+ on the left side.       Dorsalis pedis pulses are 1+ on the right side, and 1+ on the left side.  No carotid bruit  Respiratory: Effort normal and breath sounds normal. No respiratory distress. She has no wheezes. She has no rales.  GI: Soft. Bowel sounds are normal. She exhibits no distension. There is no tenderness.  Musculoskeletal: She exhibits no edema.  Lymphadenopathy:    She has no cervical adenopathy.  Neurological: She is alert and oriented to person, place, and time. She exhibits normal muscle tone.  Skin: Skin is warm and dry.  Psychiatric: She has a normal mood and affect.     Assessment/Plan Principal Problem:   Weakness Active Problems:   Chronic combined systolic and diastolic congestive heart failure, NYHA class 2   Essential hypertension   DM (diabetes mellitus), type 2, uncontrolled with complications   Chronic kidney disease (CKD), stage II (mild)   CAD S/P DES to Prox Cx & SVG-D1; PTCA of Anastomotic SVG-D1 lesion  Plan:  I think the patient is recovering from her NSTEMI on the 15th.   She was just discharged on the 19th.  She is taking all her meds as prescribed.  No angina.  I think the troponin is still trending down from >20.00.  2.35 today.   EKG shopws nothing acute.   Although her BNP is 2638, she has no  signs or symptoms of acute CHF exacerbation and may in fact be taking lasix when she does not need to.   Her SCr is trending up a little.   I discussed daily weight monitoring, low sodium diet and when to call the office.  For now she should hold the lasix, weigh daily and call the office if she increases 3# in 24 hours or 5# in a week.     Tarri Fuller, [place] 06/11/2014, 5:13 PM   Personally seen and examined. Agree with above.  79 year old female with recent non-ST elevation myocardial infarction discharge on 06/08/14.  Currently she is eager to go home, she felt earlier some generalized malaise but now overall is improved. Her creatinine was 1.3, elevated from prior. We have asked her to stop her Lasix. Hydrate. (ProBNP was 2000 however she does not appear to be fluid overloaded). Her troponin is trending down nicely. She does have a chronic cough but this is no change. No recent fevers, no strokelike symptoms, no diarrhea, melena. Hemoglobin is stable. Exam is unremarkable. No significant edema, no belly pain.  I feel comfortable allowing her to be discharged. I discussed this with the emergency team.  Candee Furbish, MD

## 2014-06-11 NOTE — ED Provider Notes (Signed)
Patient presented to the ER with generalized weakness. Patient was hospitalized last week with non-ST elevation MI. Since discharge she has not been feeling like her normal self. She reports generalized weakness. There is no unilateral or focal weakness. She does not have headache, chest pain or shortness of breath.  Face to face Exam: HEENT - PERRLA Lungs - CTAB Heart - RRR, no M/R/G Abd - S/NT/ND Neuro - alert, oriented x3  Plan:  Patient with recent non-ST elevation MI, now with generalized weakness. Will recheck cardiac markers. Check for anemia, infection causing generalized weakness. Recheck TSH, as patient has a history of hypothyroidism with recent dosing adjustments.  Orpah Greek, MD 06/11/14 1335

## 2014-06-11 NOTE — ED Notes (Signed)
Abigail PA-C at bedside updating pt on results and plan of care.

## 2014-06-11 NOTE — Telephone Encounter (Signed)
Daughter called Lurena Joiner on his cell phone stating her mother is so weak she can not support her weight.  Advised to go to ER.  She is not sure she can get her mother to do this.  I also spoke with Lavella Lemons who states she doesn't know what to do.  Having family issues with father.  Advised she go to [facility] ER for work-up.  If not willing go to her PCP who is much closer to her home.  Agreed and voiced understanding.

## 2014-06-12 NOTE — ED Provider Notes (Signed)
Medical screening examination/treatment/procedure(s) were conducted as a shared visit with non-physician practitioner(s) and myself.  I personally evaluated the patient during the encounter.  Please see separate associated note for evaluation and plan.    EKG Interpretation   Date/Time:  Wednesday June 11 2014 12:22:01 EDT Ventricular Rate:  82 PR Interval:  124 QRS Duration: 119 QT Interval:  380 QTC Calculation: 444 R Axis:   -51 Text Interpretation:  Ectopic atrial rhythm Incomplete left bundle branch  block LVH with secondary repolarization abnormality (more pronounced than  last tracing) Confirmed by Betsey Holiday  MD, CHRISTOPHER [phone]) on 06/11/2014  12:25:41 PM       Orpah Greek, MD 06/12/14 1301

## 2014-06-18 ENCOUNTER — Telehealth: Payer: Self-pay | Admitting: Cardiology

## 2014-06-18 NOTE — Telephone Encounter (Signed)
PT is good.  Crystal Man, MD

## 2014-06-18 NOTE — Telephone Encounter (Signed)
She wanted you to know that she tried to schedule physical therapy for the patient tomorrow. Her daughter wanted it started next week.

## 2014-06-18 NOTE — Telephone Encounter (Signed)
Forwarded to Dr. Ellyn Hack as a Juluis Rainier

## 2014-06-20 ENCOUNTER — Telehealth: Payer: Self-pay | Admitting: Physician Assistant

## 2014-06-20 NOTE — Telephone Encounter (Signed)
They have discharged pt from their services as of yesterday,06-19-14. Pt felt like she did not need any oftheir services any longer.

## 2014-06-27 ENCOUNTER — Ambulatory Visit (INDEPENDENT_AMBULATORY_CARE_PROVIDER_SITE_OTHER): Payer: Medicare Other | Admitting: Cardiology

## 2014-06-27 ENCOUNTER — Encounter: Payer: Self-pay | Admitting: Cardiology

## 2014-06-27 VITALS — BP 124/76 | HR 77 | Ht 63.0 in | Wt 146.7 lb

## 2014-06-27 DIAGNOSIS — I2510 Atherosclerotic heart disease of native coronary artery without angina pectoris: Secondary | ICD-10-CM

## 2014-06-27 DIAGNOSIS — E785 Hyperlipidemia, unspecified: Secondary | ICD-10-CM

## 2014-06-27 DIAGNOSIS — I639 Cerebral infarction, unspecified: Secondary | ICD-10-CM

## 2014-06-27 DIAGNOSIS — Z9861 Coronary angioplasty status: Secondary | ICD-10-CM

## 2014-06-27 DIAGNOSIS — I214 Non-ST elevation (NSTEMI) myocardial infarction: Secondary | ICD-10-CM

## 2014-06-27 DIAGNOSIS — I2589 Other forms of chronic ischemic heart disease: Secondary | ICD-10-CM

## 2014-06-27 DIAGNOSIS — I255 Ischemic cardiomyopathy: Secondary | ICD-10-CM

## 2014-06-27 DIAGNOSIS — I6350 Cerebral infarction due to unspecified occlusion or stenosis of unspecified cerebral artery: Secondary | ICD-10-CM

## 2014-06-27 DIAGNOSIS — I10 Essential (primary) hypertension: Secondary | ICD-10-CM

## 2014-06-27 MED ORDER — PANTOPRAZOLE SODIUM 40 MG PO TBEC: 40.0000 mg | DELAYED_RELEASE_TABLET | Freq: Every day | ORAL | Status: DC

## 2014-06-27 NOTE — Assessment & Plan Note (Signed)
Controlled.  

## 2014-06-27 NOTE — Progress Notes (Signed)
06/27/2014 [name]   [birth year]  [id number]  Primary Physicia Leonides Sake, MD Primary Cardiologist: Dr Ellyn Hack (pt request)  HPI:  79 y/o previously followed by Dr Rex Kras with a history of CABG in 2004, CVA 2011, DM, HTN, and dyslipidemia with statin intolerance. She was admitted with chest pain 06/04/14. Pt was admitted, started on Heparin and NTG. She ruled in for an MI by Troponin. She underwent cath with successful PCI DES of native CFX and SVG-Dx1. EF reported as moderately reduced- 45-50%. She was discharge 06/08/14. She returned to the ER 06/11/14 with "weakness". I think she has a mild cognitive deficit. She could not remember why she went to the ER or how long she was there (a few hours). Her labs and vitals in the ER were WNL. Her daughter had asked that her mother get home PT but the pt "sent them away". She tells me she has had some good days and bad days. No specific complaints. She is not taking Lipitor, her daughter says she has been unable to tolerate this in the past secondary to fatigue. She does have some DOE which I suggested may be from Marcus, this does seem to be improving. She also has anorexia and bloating.     Current Outpatient Prescriptions  Medication Sig Dispense Refill  . aspirin EC 81 MG EC tablet Take 1 tablet (81 mg total) by mouth daily.      [name] atorvastatin (LIPITOR) 80 MG tablet Take 1 tablet (80 mg total) by mouth daily at 6 PM.  30 tablet  11  . furosemide (LASIX) 20 MG tablet Take 20 mg by mouth daily as needed for fluid or edema.      . insulin glargine (LANTUS) 100 UNIT/ML injection Inject 80 Units into the skin at bedtime.      . isosorbide mononitrate (IMDUR) 15 mg TB24 24 hr tablet Take 0.5 tablets (15 mg total) by mouth daily.  15 tablet  11  . metoprolol tartrate (LOPRESSOR) 25 MG tablet Take 25 mg by mouth 2 (two) times daily.      . nitroGLYCERIN (NITROSTAT) 0.4 MG SL tablet Place 1 tablet (0.4 mg total) under the tongue every 5 (five)  minutes x 3 doses as needed for chest pain.  25 tablet  2  . ticagrelor (BRILINTA) 90 MG TABS tablet Take 1 tablet (90 mg total) by mouth 2 (two) times daily.  60 tablet  11  . pantoprazole (PROTONIX) 40 MG tablet Take 1 tablet (40 mg total) by mouth daily.  30 tablet  11   No current facility-administered medications for this visit.    Allergies  Allergen Reactions  . Statins     History   Social History  . Marital Status: Married    Spouse Name: N/A    Number of Children: 3  . Years of Education: 10 grade   Occupational History  . Retired     previously worked as a Agricultural engineer, intermittently worked at [employer]  . Smoking status: Never Smoker   . Smokeless tobacco: Never Used  . Alcohol Use: No  . Drug Use: No  . Sexual Activity: No   Other Topics Concern  . Not on file   Social History Narrative   Patient lives at home with her husband.    She has 3 children, 3 grandchildren, and 1 great-grand child.   Insurance: Medicare.      Review of Systems: General: negative for  chills, fever, night sweats or weight changes.  Cardiovascular: negative for chest pain, dyspnea on exertion, edema, orthopnea, palpitations, paroxysmal nocturnal dyspnea or shortness of breath Dermatological: negative for rash Respiratory: negative for cough or wheezing Urologic: negative for hematuria Abdominal: negative for nausea, vomiting, diarrhea, bright red blood per rectum, melena, or hematemesis Neurologic: negative for visual changes, syncope, or dizziness All other systems reviewed and are otherwise negative except as noted above.    Blood pressure 124/76, pulse 77, height 5\' 3"  (1.6 m), weight 146 lb 11.2 oz (66.543 kg).  General appearance: alert, cooperative and no distress Neck: no carotid bruit and no JVD Lungs: clear to auscultation bilaterally Heart: regular rate and rhythm   ASSESSMENT AND PLAN:   NSTEMI (non-ST elevated myocardial  infarction) Post hospital visit  CAD S/P DES to Prox Cx & SVG-D1; PTCA of Anastomotic SVG-D1 lesion Patent seen in the ER 06/11/14 with "weakness"  Hyperlipidemia with target LDL less than 70 Statin intolerant, she is not taking Lipitor  Cardiomyopathy, ischemic- EF 45-50% at cath July 2015 No symptoms of CHF  Essential hypertension Controlled  CVA (cerebral vascular accident) Lt frontal, 2011   PLAN  I added Protonix to her medications. I suggested we give a little more time to recover before making any other changes. She has requested follow up with Dr Ellyn Hack and this will be arranged.  [name] KPA-C 06/27/2014 10:06 AM

## 2014-06-27 NOTE — Patient Instructions (Signed)
Kerin Ransom, PA-C has recommended making the following medication changes:  START Protonix 40 mg - take 1 tablet daily  Your physician recommends that you schedule a follow-up appointment in 2 months with Dr Ellyn Hack.

## 2014-06-27 NOTE — Assessment & Plan Note (Signed)
Lt frontal, 2011

## 2014-06-27 NOTE — Assessment & Plan Note (Signed)
Post hospital visit

## 2014-06-27 NOTE — Assessment & Plan Note (Signed)
Patent seen in the ER 06/11/14 with "weakness"

## 2014-06-27 NOTE — Assessment & Plan Note (Signed)
No symptoms of CHF

## 2014-06-27 NOTE — Assessment & Plan Note (Signed)
Statin intolerant, she is not taking Lipitor

## 2014-07-14 ENCOUNTER — Telehealth: Payer: Self-pay | Admitting: *Deleted

## 2014-07-14 NOTE — Telephone Encounter (Signed)
Signed and faxed  Order -1) patient request  Discharge from care 2) certification and plan of care

## 2014-07-16 ENCOUNTER — Telehealth: Payer: Self-pay | Admitting: Cardiology

## 2014-07-16 DIAGNOSIS — Z79899 Other long term (current) drug therapy: Secondary | ICD-10-CM

## 2014-07-16 NOTE — Telephone Encounter (Signed)
Message forwarded to [place], [place] and Dr. Ellyn Hack to advise on possible medication change r/t cost

## 2014-07-16 NOTE — Telephone Encounter (Signed)
Patient was given prescription for Brilanta----her copay is over $100.00.  Is there anything else we can give her?

## 2014-07-16 NOTE — Telephone Encounter (Signed)
Let's get her some samples but we can switch her to Plavix 75 mg daily.  If we switch to Plavix we need to check P2Y12 Inhibition in ~2 weeks.  Leonie Man, MD

## 2014-07-17 MED ORDER — CLOPIDOGREL BISULFATE 75 MG PO TABS: 75.0000 mg | ORAL_TABLET | Freq: Every day | ORAL | Status: DC

## 2014-07-17 NOTE — Telephone Encounter (Signed)
Patient notified of med change per Dr. Ellyn Hack. She would like to change today from Brilinta to Plavix as she cannot get to office for samples. meds ordered. Patient explained how to take it and when to have blood work (P2Y12)

## 2014-07-18 ENCOUNTER — Telehealth: Payer: Self-pay | Admitting: Cardiology

## 2014-07-18 NOTE — Telephone Encounter (Signed)
Please call ,need to know what strength of Isosorbide she is suppose to be taking?

## 2014-07-18 NOTE — Telephone Encounter (Signed)
Called pharmacy to clarify that patient is to be taking isosorbide mononitrate 15mg  once daily (1/2 tablet of 30mg )

## 2014-07-29 ENCOUNTER — Telehealth: Payer: Self-pay | Admitting: *Deleted

## 2014-07-29 NOTE — Telephone Encounter (Signed)
Called patient to remind her of the blood work she will need to get this week - P2Y12. She reports her brother in law just passed away this morning so she will likely go early next week. Informed her of location to go at [facility]

## 2014-07-29 NOTE — Telephone Encounter (Signed)
Message copied by Fidel Levy on Tue Jul 29, 2014  8:20 AM ------      Message from: Fidel Levy      Created: Thu Jul 17, 2014  8:18 AM      Regarding: P2Y12       Needs P2Y12            Go to Main Entrance at [facility] - Registration ------

## 2014-07-29 NOTE — Telephone Encounter (Signed)
That is fine.  Just get it done when possible.  Leonie Man, MD

## 2014-08-08 ENCOUNTER — Telehealth: Payer: Self-pay | Admitting: *Deleted

## 2014-08-08 NOTE — Telephone Encounter (Signed)
Called patient to remind her to get her P2Y12 labs done at [facility]. She states she had forgotten. She says she will try to remember to go next week.

## 2014-08-14 NOTE — Telephone Encounter (Signed)
Called patient To remind her to have labwork drawn She states has something to do tomorrow but will do it next wweek. Informed patient to go to  [facility] hospital main entrance registration--labs

## 2014-08-25 ENCOUNTER — Encounter: Payer: Self-pay | Admitting: Cardiology

## 2014-08-25 ENCOUNTER — Ambulatory Visit (INDEPENDENT_AMBULATORY_CARE_PROVIDER_SITE_OTHER): Payer: Medicare Other | Admitting: Cardiology

## 2014-08-25 VITALS — BP 110/70 | HR 68 | Ht 62.0 in | Wt 146.8 lb

## 2014-08-25 DIAGNOSIS — E118 Type 2 diabetes mellitus with unspecified complications: Secondary | ICD-10-CM

## 2014-08-25 DIAGNOSIS — E785 Hyperlipidemia, unspecified: Secondary | ICD-10-CM

## 2014-08-25 DIAGNOSIS — I25710 Atherosclerosis of autologous vein coronary artery bypass graft(s) with unstable angina pectoris: Secondary | ICD-10-CM

## 2014-08-25 DIAGNOSIS — IMO0002 Reserved for concepts with insufficient information to code with codable children: Secondary | ICD-10-CM

## 2014-08-25 DIAGNOSIS — I255 Ischemic cardiomyopathy: Secondary | ICD-10-CM

## 2014-08-25 DIAGNOSIS — I5042 Chronic combined systolic (congestive) and diastolic (congestive) heart failure: Secondary | ICD-10-CM

## 2014-08-25 DIAGNOSIS — I10 Essential (primary) hypertension: Secondary | ICD-10-CM

## 2014-08-25 DIAGNOSIS — I701 Atherosclerosis of renal artery: Secondary | ICD-10-CM

## 2014-08-25 DIAGNOSIS — I2510 Atherosclerotic heart disease of native coronary artery without angina pectoris: Secondary | ICD-10-CM

## 2014-08-25 DIAGNOSIS — Z9861 Coronary angioplasty status: Secondary | ICD-10-CM

## 2014-08-25 DIAGNOSIS — E1165 Type 2 diabetes mellitus with hyperglycemia: Secondary | ICD-10-CM

## 2014-08-25 NOTE — Progress Notes (Signed)
PCP: Crystal Sake, MD  Clinic Note: Chief Complaint  Patient presents with  . 2 month visit    no no chest pain , once in while sob , edema l>r, not taking atorvastatin very fatigue while taking medication.    HPI: [name] is a 79 y.o. female with a PMH below who presents today for her second post hospital followup from non-ST elevation MI status post 2 site PCI in July of 2015. She saw Crystal Ransom, PA-C. on August 7, and was converted from her next 2 Plavix. He also noted being intolerant of Lipitor with extreme fatigue and is therefore not been taking it. She was seen briefly in the emergency room for "weakness "but was stable upon evaluation by [name], [name].  Past Medical History  Diagnosis Date  . CAD (coronary artery disease), native coronary artery 2004    a) 6/'64: Complicated PCI-of pLAD wtih PTCA  (failed redo PTCA) --> CABG 06/2003 for ISR LAD & D1 (LIMA-LAD, SVG-dRCA, SVG-D1);; b) 4/'07: Patent grafts, Normal RHC pressures; c) NSTEMI 9/'10: No culprit; 1/'12: recanalized LAD stent, patent grafts  . S/P CABG x 3 06/2003    LIMA-LAD, SVG-D1, SVG-dRCA  . Cardiomyopathy secondary to chemotherapy 07/2009    a) Combined Ischemic & Non-ischemic (Adriamycin from [facility] Rx)EF ~40% by Cath; by Echo in 08/2010 35-40%;; b) followup echo August 2013: EF 50-55%, aortic sclerosis without stenosis. Grade 1 diastolic dysfunction  . Chronic combined systolic and diastolic congestive heart failure, NYHA class 2 07/2009    Mixed. 2D-echocardiogram (08/2010) - 35% to 40%. Diffuse hypokinesis. Grade 2 diastolic dysfunction.  . Atherosclerosis of autologous vein coronary artery bypass graft with unstable angina pectoris     CATH: 80% prox SVG-D1, 95% anastomotic SVG-D1 lesion  . CAD S/P percutaneous coronary angioplasty 06/05/2014    a) 1/04: BMS x 2 LAD, PTCA D1 followed by unusuccessful redo PTCA D1 --> initially turned down CABG initially then CABG in 06/2003;; b) 05/2014: DES to Prox Cx &  SVG-D1; PTCA of Anastomotic SVG-D1 lesion  . CKD (chronic kidney disease) stage 2, GFR 60-89 ml/min     BL Cr 1.6  . History of renal artery stenosis 2010    Left 95% stenosis s/p left renal artery stenting (09/2009), right renal artery with 50% stenosis.  [name] History of CVA (cerebrovascular accident) 11/2009; 8& 04/2009    left frontal lobe stroke (11/2009), TIA (06/2009, 04/2009) with multiple prior TIAs manifested by aphasia. H/o CVA in 1988 involving right eye.  [name] Hx of adenomatous colonic polyps     remote history. Last colonoscopy (03/2004) - Normal colonoscopy to the cecum, rec 5 year follow-up colonoscopy.  [name] History of DVT of lower extremity 07/2003    in setting of saphenous vein grafting for CABG  . Essential hypertension 03/12/2012  . DM (diabetes mellitus), type 2, uncontrolled with complications DX: [id number]    Stopped of Metformin in 04/2011 in setting of AKI (Cr 3), now on insulin.  [name] Hyperlipidemia with target LDL less than 70   . DJD (degenerative joint disease) of knee     left  . History of breast cancer     Primary oncologist Dr. Brigitte [name]. // First diagnosed 2000, recurrence in 06/2010. S/P left mastectomy with radiation therapy (2000), following recurrence, started chemothearpy, completed course (06/2010.)  . Dementia   . Hypothyroidism    Interval History: Since her last visit she notes intermittent exertional dyspnea but nothing significant. She denies any recurrent angina  or chest tightness or pressure. No heart failure symptoms of PND, orthopnea, or significant edema (minimal edema on current medications). No palpitations, lightheadedness, dizziness, weakness or syncope/near syncope.  She is gradually starting to get into more him activity, and is feeling less fatigued since being off of the statin.  ROS: A comprehensive was performed. Review of Systems  Constitutional: Negative for fever, chills and malaise/fatigue.  HENT: Negative for congestion and nosebleeds.     Respiratory: Negative for cough, hemoptysis, sputum production and wheezing.   Cardiovascular: Positive for leg swelling. Negative for claudication.       Per history of present illness  Gastrointestinal: Negative for heartburn, blood in stool and melena.  Genitourinary: Negative for hematuria.  Musculoskeletal: Negative for myalgias.  Skin: Negative.   Endo/Heme/Allergies: Does not bruise/bleed easily.  Psychiatric/Behavioral: Negative for depression. The patient is not nervous/anxious.   All other systems reviewed and are negative.  Current Outpatient Prescriptions on File Prior to Visit  Medication Sig Dispense Refill  . aspirin EC 81 MG EC tablet Take 1 tablet (81 mg total) by mouth daily.      . clopidogrel (PLAVIX) 75 MG tablet Take 1 tablet (75 mg total) by mouth daily.  30 tablet  6  . furosemide (LASIX) 20 MG tablet Take 20 mg by mouth daily as needed for fluid or edema.      . insulin glargine (LANTUS) 100 UNIT/ML injection Inject 80 Units into the skin at bedtime.      . isosorbide mononitrate (IMDUR) 15 mg TB24 24 hr tablet Take 0.5 tablets (15 mg total) by mouth daily.  15 tablet  11  . metoprolol tartrate (LOPRESSOR) 25 MG tablet Take 25 mg by mouth 2 (two) times daily.      . nitroGLYCERIN (NITROSTAT) 0.4 MG SL tablet Place 1 tablet (0.4 mg total) under the tongue every 5 (five) minutes x 3 doses as needed for chest pain.  25 tablet  2  . pantoprazole (PROTONIX) 40 MG tablet Take 1 tablet (40 mg total) by mouth daily.  30 tablet  11   No current facility-administered medications on file prior to visit.    ALLERGIES REVIEWED IN EPIC -- Brilinta and atorvastatin added SOCIAL AND FAMILY HISTORY REVIEWED IN EPIC -- no change  Wt Readings from Last 3 Encounters:  08/25/14 146 lb 12.8 oz (66.588 kg)  06/27/14 146 lb 11.2 oz (66.543 kg)  06/07/14 154 lb 8.7 oz (70.1 kg)   PHYSICAL EXAM BP 110/70  [name] 70  Ht 5\' 2"  (1.575 m)  Wt 146 lb 12.8 oz (66.588 kg)  BMI 26.84  kg/m2 General appearance: alert, cooperative, appears stated age, no distress; otherwise healthy appearing  Neck: no adenopathy, no carotid bruit and no JVD Lungs: clear to auscultation bilaterally, normal percussion bilaterally and non-labored Heart: regular rate and rhythm, S1, S2 normal, no murmur, click, rub or gallop; nondisplaced PMI Abdomen: soft, non-tender; bowel sounds normal; no masses,  no organomegaly; Extremities: extremities normal, atraumatic, no cyanosis, or edema; Pulses: 2+ and symmetric; Neurologic: Mental status: Alert, oriented, thought content appropriate Cranial nerves: normal (II-XII grossly intact)   Adult ECG Report not performed  Recent Labs:  No new labs   ASSESSMENT / PLAN: Relatively stable 3 months post PCI for non-STEMI. Due for echo to reassess EF. Diverting limited to Plavix. Will check P2Y 12 assay.   CAD S/P DES to Prox Cx & SVG-D1; PTCA of Anastomotic SVG-D1 lesion No active symptoms. On aspirin plus Plavix (replacing Brilinta).  No bleeding. Check P2Y12 inhibition assay Low-dose beta blocker , Imdur and when necessary nitroglycerin. Not on statin due to intolerance.  Atherosclerosis of autologous vein coronary artery bypass graft with unstable angina pectoris / NSTMI PCI to the SVG-diagonal at the same time as PCI to the native circumflex. Stable with no active symptoms.  Cardiomyopathy, ischemic- EF 45-50% at cath July 2015  I think her cardiomyopathy initially was thought to be nonischemic previously, however relate to oh 50-55%, her LV gram EF was estimated at 45-50%. She has not had an echocardiogram following her recent nondistended. Now that she is 3 months out from PCI, and on adequate medical therapy, would be prudent to recheck echocardiogram for new baseline.  Chronic combined systolic and diastolic congestive heart failure, NYHA class 2 No active heart failure symptoms. On low-dose diuretic but no significant edema. Also on beta blocker.   But only a low blood pressure available for adding an ACE inhibitor or ARB. We'll need to reassess EF prior to return without restart either medication.  Essential hypertension Currently well controlled on beta blocker plus Imdur.  Hyperlipidemia with target LDL less than 70; and statin intolerant She apparently just had her lipids checked by her primary provider Crystal Bender, [place]). We'll need to get labs from their office. I will then refer her to [name], our clinical pharmacists for referral consideration for possibly being rolled in the [facility] trial with PCSK9 inhibitors.  Renal artery stenosis Stable. Renal artery Dopplers will be ordered as scheduled by the followup protocol.  DM (diabetes mellitus), type 2, uncontrolled with complications Now on insulin and managed by PCP.    Orders Placed This Encounter  Procedures  . 2D Echocardiogram without contrast    Standing Status: Future     Number of Occurrences:      Standing Expiration Date: 08/25/2015    Order Specific Question:  Type of Echo    Answer:  Complete    Order Specific Question:  Where should this test be performed    Answer:  [facility]    Order Specific Question:  Reason for exam-Echo    Answer:  CAD Native Vessel  414.01 / I25.10    Order Specific Question:  Reason for exam-Echo    Answer:  Dyspnea  786.09 / R06.00   No orders of the defined types were placed in this encounter.    Followup: Roughly 3 months   [name],[name], M.D., M.S. Interventional Cardiologist   Pager # [phone]

## 2014-08-25 NOTE — Patient Instructions (Signed)
PLEASE SCHEDULE AT [facility] physician has requested that you have an echocardiogram. Echocardiography is a painless test that uses sound waves to create images of your heart. It provides your doctor with information about the size and shape of your heart and how well your heart's chambers and valves are working. This procedure takes approximately one hour. There are no restrictions for this procedure.  PLEASE GO TO [facility]-- TO P2Y12 LAB - GO TO MAIN ENTRANCE- REGISTRATION FIRST   Your physician wants you to follow-up in [place].  You will receive a reminder letter in the mail two months in advance. If you don't receive a letter, please call our office to schedule the follow-up appointment.

## 2014-08-26 ENCOUNTER — Encounter: Payer: Self-pay | Admitting: Cardiology

## 2014-08-26 NOTE — Assessment & Plan Note (Signed)
Stable. Renal artery Dopplers will be ordered as scheduled by the followup protocol.

## 2014-08-26 NOTE — Assessment & Plan Note (Signed)
No active heart failure symptoms. On low-dose diuretic but no significant edema. Also on beta blocker.  But only a low blood pressure available for adding an ACE inhibitor or ARB. We'll need to reassess EF prior to return without restart either medication.

## 2014-08-26 NOTE — Assessment & Plan Note (Addendum)
No active symptoms. On aspirin plus Plavix (replacing Brilinta). No bleeding. Check P2Y12 inhibition assay Low-dose beta blocker , Imdur and when necessary nitroglycerin. Not on statin due to intolerance.

## 2014-08-26 NOTE — Assessment & Plan Note (Signed)
Now on insulin and managed by PCP.

## 2014-08-26 NOTE — Assessment & Plan Note (Signed)
PCI to the SVG-diagonal at the same time as PCI to the native circumflex. Stable with no active symptoms.

## 2014-08-26 NOTE — Assessment & Plan Note (Signed)
She apparently just had her lipids checked by her primary provider Cyndi Bender, [place]). We'll need to get labs from their office. I will then refer her to Nehemiah Massed, our clinical pharmacists for referral consideration for possibly being rolled in the [facility] trial with PCSK9 inhibitors.

## 2014-08-26 NOTE — Assessment & Plan Note (Signed)
I think her cardiomyopathy initially was thought to be nonischemic previously, however relate to oh 50-55%, her LV gram EF was estimated at 45-50%. She has not had an echocardiogram following her recent nondistended. Now that she is 3 months out from PCI, and on adequate medical therapy, would be prudent to recheck echocardiogram for new baseline.

## 2014-08-26 NOTE — Assessment & Plan Note (Signed)
Currently well controlled on beta blocker plus Imdur.

## 2014-09-02 ENCOUNTER — Ambulatory Visit (HOSPITAL_COMMUNITY)
Admission: AD | Admit: 2014-09-02 | Discharge: 2014-09-02 | Disposition: A | Discharge status: Home / Self Care | Payer: Medicare Other | Source: Ambulatory Visit | Attending: Cardiology | Admitting: Cardiology

## 2014-09-02 ENCOUNTER — Ambulatory Visit (HOSPITAL_COMMUNITY)
Admission: RE | Admit: 2014-09-02 | Discharge: 2014-09-02 | Disposition: A | Discharge status: Home / Self Care | Payer: Medicare Other | Source: Ambulatory Visit | Attending: Cardiology | Admitting: Cardiology

## 2014-09-02 DIAGNOSIS — I2510 Atherosclerotic heart disease of native coronary artery without angina pectoris: Secondary | ICD-10-CM | POA: Diagnosis present

## 2014-09-02 DIAGNOSIS — I255 Ischemic cardiomyopathy: Secondary | ICD-10-CM

## 2014-09-02 DIAGNOSIS — I10 Essential (primary) hypertension: Secondary | ICD-10-CM | POA: Insufficient documentation

## 2014-09-02 DIAGNOSIS — I517 Cardiomegaly: Secondary | ICD-10-CM

## 2014-09-02 DIAGNOSIS — Z9861 Coronary angioplasty status: Secondary | ICD-10-CM

## 2014-09-02 DIAGNOSIS — I5042 Chronic combined systolic (congestive) and diastolic (congestive) heart failure: Secondary | ICD-10-CM

## 2014-09-02 DIAGNOSIS — E119 Type 2 diabetes mellitus without complications: Secondary | ICD-10-CM | POA: Insufficient documentation

## 2014-09-02 DIAGNOSIS — E785 Hyperlipidemia, unspecified: Secondary | ICD-10-CM | POA: Insufficient documentation

## 2014-09-02 LAB — PLATELET INHIBITION P2Y12: Platelet Function  P2Y12: 225 [PRU] (ref 194–418)

## 2014-09-02 NOTE — Progress Notes (Signed)
2D Echocardiogram Complete.  09/02/2014   [name] [place], [place]

## 2014-09-03 ENCOUNTER — Telehealth: Payer: Self-pay | Admitting: *Deleted

## 2014-09-03 MED ORDER — PRASUGREL HCL 10 MG PO TABS: 10.0000 mg | ORAL_TABLET | Freq: Every day | ORAL | Status: DC

## 2014-09-03 NOTE — Telephone Encounter (Signed)
Message copied by Raiford Simmonds on Wed Sep 03, 2014  9:16 AM ------      Message from: Leonie Man      Created: Tue Sep 02, 2014 12:57 PM       Unfortunately, the P2Y 12 inhibition assay suggest that she is NOT a Plavix responder -- we should either go back to Brilinta without ASA or convert to Effient.            Leonie Man, MD       ------

## 2014-09-03 NOTE — Telephone Encounter (Signed)
Informed daughter , patient is not a responder.  patient will start EFFIENT , INSTEAD OF BRILINTA . SHE HAD AN ISSUE POSSIBLE WITH SHORTNESS BREATH WHILE ON BRILINTA Samples left (4) Informed daughter to call for more samples ,also gave the phone number for [place]. TO HELP WITH PART D SIGN UP

## 2014-09-04 ENCOUNTER — Telehealth: Payer: Self-pay | Admitting: *Deleted

## 2014-09-04 NOTE — Telephone Encounter (Signed)
Message copied by Raiford Simmonds on Thu Sep 04, 2014  9:15 AM ------      Message from: Leonie Man      Created: Wed Sep 03, 2014  6:54 PM       Echo results: 09/02/2014            Overall reduced pump function with EF of 40-45%, down from 50 and 55%. This time there appears to be a wall motion abnormality suggestive of anterior/LAD infarction..            New findings: New anterior wall motion abnormality with reduced ejection fraction.      This corresponds to what I saw in the cardiac catheterization lab. We were hoping that the ejection fraction would improve. We will continue with her medication regimen, but if she is having any heart failure or chest discomfort symptoms we should see her sooner than originally planned.            Leonie Man, MD       ------

## 2014-09-04 NOTE — Telephone Encounter (Signed)
Spoke to daughter. Result given . Verbalized understanding  

## 2014-09-26 ENCOUNTER — Other Ambulatory Visit: Payer: Self-pay | Admitting: Cardiology

## 2014-09-26 MED ORDER — PRASUGREL HCL 10 MG PO TABS: 10.0000 mg | ORAL_TABLET | Freq: Every day | ORAL | Status: DC

## 2014-09-26 NOTE — Telephone Encounter (Signed)
Samples provided. Patient/daughter aware they will be at front for pick up

## 2014-09-26 NOTE — Telephone Encounter (Signed)
Pt would like samples of Effient 10 mg please.Daughter said she was told to ask for a month supply.

## 2014-10-30 ENCOUNTER — Encounter (HOSPITAL_COMMUNITY): Payer: Self-pay | Admitting: Cardiology

## 2014-11-06 ENCOUNTER — Telehealth: Payer: Self-pay | Admitting: Cardiology

## 2014-11-06 MED ORDER — PRASUGREL HCL 10 MG PO TABS: 10.0000 mg | ORAL_TABLET | Freq: Every day | ORAL | Status: DC

## 2014-11-06 NOTE — Telephone Encounter (Signed)
[name] was calling in stating that Dr. Ellyn Hack wanted to see this pt back in January for a 3 mo f/u because she had a bad MI in October. Can she be fit in sooner that March and she also requested that appt to be on a Friday if possible. Please call  Thanks

## 2014-11-06 NOTE — Telephone Encounter (Signed)
WILL REVIEW SCHEDULE AND CONTACT PATIENT'S [facility]

## 2014-11-06 NOTE — Telephone Encounter (Signed)
Pt would like some samples of Effient please. If so her niece is right next door and would like to pick them up while she is here now.

## 2014-11-06 NOTE — Telephone Encounter (Signed)
Samples left up front desk 3 bottles

## 2014-11-07 NOTE — Telephone Encounter (Signed)
PLEASE ADD PATIENT ON 12/26/14 AT 9:30 ONLY, HARDING SCHEDULE  AND CANCEL APPOINTMENT FOR MARCH 2016 ALSO YOU CAN PLACE ON WAITLIST  FOR ANYTHING SOONER. ANY ISSUE LET ME KNOW

## 2014-12-26 ENCOUNTER — Ambulatory Visit: Payer: Medicare Other | Admitting: Cardiology

## 2015-03-13 ENCOUNTER — Ambulatory Visit (INDEPENDENT_AMBULATORY_CARE_PROVIDER_SITE_OTHER): Payer: Medicare Other | Admitting: Cardiology

## 2015-03-13 ENCOUNTER — Encounter: Payer: Self-pay | Admitting: Cardiology

## 2015-03-13 VITALS — BP 106/60 | HR 57 | Ht 63.0 in | Wt 142.8 lb

## 2015-03-13 DIAGNOSIS — I255 Ischemic cardiomyopathy: Secondary | ICD-10-CM | POA: Diagnosis not present

## 2015-03-13 DIAGNOSIS — I214 Non-ST elevation (NSTEMI) myocardial infarction: Secondary | ICD-10-CM | POA: Diagnosis not present

## 2015-03-13 DIAGNOSIS — I10 Essential (primary) hypertension: Secondary | ICD-10-CM

## 2015-03-13 DIAGNOSIS — I25710 Atherosclerosis of autologous vein coronary artery bypass graft(s) with unstable angina pectoris: Secondary | ICD-10-CM | POA: Diagnosis not present

## 2015-03-13 DIAGNOSIS — I639 Cerebral infarction, unspecified: Secondary | ICD-10-CM

## 2015-03-13 DIAGNOSIS — E785 Hyperlipidemia, unspecified: Secondary | ICD-10-CM

## 2015-03-13 DIAGNOSIS — I2510 Atherosclerotic heart disease of native coronary artery without angina pectoris: Secondary | ICD-10-CM | POA: Diagnosis not present

## 2015-03-13 DIAGNOSIS — E118 Type 2 diabetes mellitus with unspecified complications: Secondary | ICD-10-CM

## 2015-03-13 DIAGNOSIS — Z9861 Coronary angioplasty status: Secondary | ICD-10-CM

## 2015-03-13 DIAGNOSIS — R001 Bradycardia, unspecified: Secondary | ICD-10-CM | POA: Diagnosis not present

## 2015-03-13 DIAGNOSIS — E1165 Type 2 diabetes mellitus with hyperglycemia: Secondary | ICD-10-CM

## 2015-03-13 DIAGNOSIS — IMO0002 Reserved for concepts with insufficient information to code with codable children: Secondary | ICD-10-CM

## 2015-03-13 NOTE — Patient Instructions (Signed)
Your physician has recommended you make the following change in your medication: STOP isosorbide mononitrate (imdur)  In July, when you are finished with Effient, you can change back plavix 75mg  daily   Your physician wants you to follow-up in: 6 months with Dr. Ellyn Hack. You will receive a reminder letter in the mail two months in advance. If you don't receive a letter, please call our office to schedule the follow-up appointment.

## 2015-03-13 NOTE — Progress Notes (Signed)
PCP: Cyndi Bender, PA-C  Clinic Note: Chief Complaint  Patient presents with  . 6 month visit    occasional lightheadedness/dizziness that isn't that bad; is sleeping more than normal  . Coronary Artery Disease   HPI: [name] is a 80 y.o. female with a PMH below who presents today for ~8 months f/u - CAD-CABG & PCI July for MI.   Past Medical History  Diagnosis Date  . CAD (coronary artery disease), native coronary artery 2004    a) 4/'98: Complicated PCI-of pLAD wtih PTCA  (failed redo PTCA) --> CABG 06/2003 for ISR LAD & D1 (LIMA-LAD, SVG-dRCA, SVG-D1);; b) 4/'07: Patent grafts, Normal RHC pressures; c) NSTEMI 9/'10: No culprit; 1/'12: recanalized LAD stent, patent grafts  . S/P CABG x 3 06/2003    LIMA-LAD, SVG-D1, SVG-dRCA  . Cardiomyopathy secondary to chemotherapy 07/2009    a) Combined Ischemic & Non-ischemic (Adriamycin from [facility])EF ~40% by Cath; by Echo in 08/2010 35-40%;; b) followup echo August 2013: EF 50-55%, aortic sclerosis without stenosis. Grade 1 diastolic dysfunction  . Chronic combined systolic and diastolic congestive heart failure, NYHA class 2 07/2009    Mixed. 2D-echocardiogram (08/2010) - 35% to 40%. Diffuse hypokinesis. Grade 2 diastolic dysfunction.  . Atherosclerosis of autologous vein coronary artery bypass graft with unstable angina pectoris     CATH: 80% prox SVG-D1, 95% anastomotic SVG-D1 lesion  . CAD S/P percutaneous coronary angioplasty 06/05/2014    a) 1/04: BMS x 2 LAD, PTCA D1 followed by unusuccessful redo PTCA D1 --> initially turned down CABG initially then CABG in 06/2003;; b) 05/2014: DES to Prox Cx & SVG-D1; PTCA of Anastomotic SVG-D1 lesion  . CKD (chronic kidney disease) stage 2, GFR 60-89 ml/min     BL Cr 1.6  . History of renal artery stenosis 2010    Left 95% stenosis s/p left renal artery stenting (09/2009), right renal artery with 50% stenosis.  [name] History of CVA (cerebrovascular accident) 11/2009; 8& 04/2009    left frontal lobe  stroke (11/2009), TIA (06/2009, 04/2009) with multiple prior TIAs manifested by aphasia. H/o CVA in 1988 involving right eye.  [name] Hx of adenomatous colonic polyps     remote history. Last colonoscopy (03/2004) - Normal colonoscopy to the cecum, rec 5 year follow-up colonoscopy.  [name] History of DVT of lower extremity 07/2003    in setting of saphenous vein grafting for CABG  . Essential hypertension 03/12/2012  . DM (diabetes mellitus), type 2, uncontrolled with complications DX: 2641    Stopped of Metformin in 04/2011 in setting of AKI (Cr 3), now on insulin.  [name] Hyperlipidemia with target LDL less than 70   . DJD (degenerative joint disease) of knee     left  . History of breast cancer     Primary oncologist Dr. Brigitte Pulse. // First diagnosed 2000, recurrence in 06/2010. S/P left mastectomy with radiation therapy (2000), following recurrence, started chemothearpy, completed course (06/2010.)  . Dementia   . Hypothyroidism     Prior Cardiac Evaluation and Past Surgical History: Past Surgical History  Procedure Laterality Date  . Total knee arthroplasty  12/2004, 12/2005    BL, right (2006) by Dr. Berenice Primas. Left (2007) by Dr. Ronnie Derby.  . Simple mastectomy  2000    left, in setting of breast cancer  . Total abdominal hysterectomy    . Lumbar disc surgery    . Renal artery angioplasty Left 10/05/2009    95% renal artery stenosis. Predilated with a 4x15  Aviator balloon, stented with a 5x15 Genesis and Aviator balloon stent with dilatatin at 10 atm. Resulting in reduction of 95% to 0% with excellent flow.  . Renal doppler  10/23/2011    Bilateral Renal Artery - No evidence of significant diameter reduction, tortuosity, or any other vascular abnormality/  . Coronary artery bypass graft  07/23/2003    x3. LIMA to LAD, SVG to diagonal, SVG to RCA.  . Cardiac catheterization  12/10/2002    Successful stenting of LAD with Cutting Balloon angioplasty of diagonal #1. There was a residual type B dissection in  first diagonal branch.   . Cardiac catheterization  01/14/2003    Ostial 80% narrowing of the LAD - procedure was terminated after 5 unsuccessful attempts to pass the balloon into the stents and down to the lesion.  . Cardiac catheterization  07/22/2003    Recommended CABG revascularization  . Cardiac catheterization  03/01/2006    Low, normal LV systolic function, normal pulmonary artery pressures, and patent grafts.  . Cardiac catheterization  02/19/2008    Continue current medications and treatment plan.  . Cardiac catheterization  08/19/2009    Patent grafts with normal LV function  . Cardiac catheterization  11/25/2010    40% EF with anterior and distal inferior wall motion abnormalities. Patient grafts. Progression of diagonal and LAD disease.  . Cardiovascular stress test  09/30/2009    No scintigraphic evidence of inducible myocardial ischemia. No ECG changes. EKG negative for ischemia.  . Transthoracic echocardiogram  03/13/2012    EF 50-55%, mild focal basal hypertrophy of the septum. Abnormal LV relaxation (grade 1 diastolic dysfunction).  . Left heart catheterization with coronary/graft angiogram N/A 06/05/2014    Procedure: LEFT HEART CATHETERIZATION WITH Beatrix Fetters;  Surgeon: Leonie Man, MD;  Location: [facility] CATH LAB;  Service: Cardiovascular;  Laterality: N/A;    Interval History: Mostly complains of being tired a lot.  Sleeps well.  In bed by ~6-8 PM.   Get SOB walking around the block, but denies any chest chest pressure. This seems a relatively the same as noted previously been her baseline.  Cardiovascular ROS: positive for - dyspnea on exertion and tired/ fatigued, occasional LE edema (worse in summer) negative for - irregular heartbeat, orthopnea, paroxysmal nocturnal dyspnea, rapid heart rate, shortness of breath or occasional palpitations.   ROS: A comprehensive was performed. Review of Systems  Constitutional: Positive for malaise/fatigue (more - sleepy;  decreased exercise tolerance).  HENT: Negative for nosebleeds.   Respiratory: Negative for cough.   Cardiovascular: Negative.  Negative for claudication and leg swelling.       Per HPI   Gastrointestinal: Negative for heartburn, blood in stool and melena.  Genitourinary: Negative for hematuria.  Neurological: Positive for dizziness (with bending over).       No new CVA or TIA symptoms  Psychiatric/Behavioral: Positive for memory loss (worse since last CVA).  All other systems reviewed and are negative.  Her daughter generally thinks that the patient's level of activity is significantly decreased since her stroke. She has less motivation to do any type of walking or exercise.  Current Outpatient Prescriptions on File Prior to Visit  Medication Sig Dispense Refill  . aspirin EC 81 MG EC tablet Take 1 tablet (81 mg total) by mouth daily.    . furosemide (LASIX) 20 MG tablet Take 20 mg by mouth daily as needed for fluid or edema.    . insulin glargine (LANTUS) 100 UNIT/ML injection Inject 80 Units into  the skin at bedtime.    . metoprolol tartrate (LOPRESSOR) 25 MG tablet Take 25 mg by mouth 2 (two) times daily.    . nitroGLYCERIN (NITROSTAT) 0.4 MG SL tablet Place 1 tablet (0.4 mg total) under the tongue every 5 (five) minutes x 3 doses as needed for chest pain. 25 tablet 2  . pantoprazole (PROTONIX) 40 MG tablet Take 1 tablet (40 mg total) by mouth daily. 30 tablet 11  . prasugrel (EFFIENT) 10 MG TABS tablet Take 1 tablet (10 mg total) by mouth daily. 30 tablet 11   No current facility-administered medications on file prior to visit.   Allergies  Allergen Reactions  . Brilinta [Ticagrelor] Shortness Of Breath  . Plavix [Clopidogrel Bisulfate] Other (See Comments)    NOT A RESPONDER- P2Y12 ASSAY WAS 224 (09/02/14)  . Statins      History  Substance Use Topics  . Smoking status: Never Smoker   . Smokeless tobacco: Never Used  . Alcohol Use: No   Family History  Problem Relation  Age of Onset  . Heart disease Mother   . Diabetes Mother   . Heart disease Father   . Cancer Father     colon cancer  . Heart disease Sister   . Heart disease Brother   . Cancer Brother     prostate cancer  . Heart disease Brother   . Cancer Sister     breast cancer     Wt Readings from Last 3 Encounters:  03/13/15 142 lb 12.8 oz (64.774 kg)  08/25/14 146 lb 12.8 oz (66.588 kg)  06/27/14 146 lb 11.2 oz (66.543 kg)    PHYSICAL EXAM BP 106/60 mmHg  Pulse 57  Ht 5' 3"  (1.6 m)  Wt 142 lb 12.8 oz (64.774 kg)  BMI 25.30 kg/m2 General appearance: alert, cooperative, appears stated age, no distress; otherwise healthy appearing  Neck: no adenopathy, no carotid bruit and no JVD Lungs: clear to auscultation bilaterally, normal percussion bilaterally and non-labored Heart: regular rate and rhythm, S1, S2 normal, no murmur, click, rub or gallop; nondisplaced PMI Abdomen: soft, non-tender; bowel sounds normal; no masses, no organomegaly; Extremities: extremities normal, atraumatic, no cyanosis, or edema; Pulses: 2+ and symmetric; Neurologic: Mental status: Alert, oriented, thought content appropriate Cranial nerves: normal (II-XII grossly intact); poor memory recall   Adult ECG Report  Rate: 56 ;  Rhythm: sinus bradycardia; LAD with Anteroseptal MI, age undetermined; non-specific ST-T changes  Narrative Interpretation: Stable to improved EVG  - less prominent ST depression  Recent Labs:  Lipids checked by PCP  ASSESSMENT / PLAN:  -- Lisinopril was not ordered (entered in error) -- will need to investigate if she is actually taking this or not. Problem List Items Addressed This Visit    Atherosclerosis of autologous vein coronary artery bypass graft with unstable angina pectoris / NSTMI    She had PCI to the SVG-diagonal as well as PCI to the native circumflex. Note more active anginal symptoms. She is on aspirin, beta blocker, Imdur and Effient. I think with her borderline blood  pressure, they would probably try to minimize her medications and hold/stop her Imdur unless she has more chest pain.      Relevant Medications   lisinopril (PRINIVIL,ZESTRIL) 5 MG tablet   Other Relevant Orders   EKG 12-Lead   Bradycardia   CAD S/P DES to Prox Cx & SVG-D1; PTCA of Anastomotic SVG-D1 lesion (Chronic)    She does have DES stents in the native circumflex as  well as the anastomotic SVG-D1 lesion. Was found to be a Plavix nonresponder. For that reason I would want her to be on it it for at least a year. She had breathing issues with Brilinta.  Prefer not to be on Effient and a longer than need be. I think once we finished June she can restart Plavix at 75 mg. I would load her with 150 mg for her first dose.      Relevant Medications   lisinopril (PRINIVIL,ZESTRIL) 5 MG tablet   Other Relevant Orders   EKG 12-Lead   Cardiomyopathy, ischemic- EF 45-50% at cath July 2015  (Chronic)    No active heart failure symptoms.  Unless she were to have any worsening symptoms, we discussed the pros and cons of recheck an echocardiogram and decided just to simply not recheck an echo this time. She is on a dose of furosemide that is mostly uses a when necessary dose      Relevant Medications   lisinopril (PRINIVIL,ZESTRIL) 5 MG tablet   Other Relevant Orders   EKG 12-Lead   CVA (cerebral vascular accident) (Chronic)   Relevant Medications   lisinopril (PRINIVIL,ZESTRIL) 5 MG tablet   Other Relevant Orders   EKG 12-Lead   DM (diabetes mellitus), type 2, uncontrolled with complications (Chronic)   Relevant Medications   lisinopril (PRINIVIL,ZESTRIL) 5 MG tablet   Essential hypertension (Chronic)   Relevant Medications   lisinopril (PRINIVIL,ZESTRIL) 5 MG tablet   Other Relevant Orders   EKG 12-Lead   Hyperlipidemia with target LDL less than 70; and statin intolerant (Chronic)    Not on statin. Apparently has been intolerant in the past. I don't have results from PCP. I don't think  that a statin would be a great idea for her.  We mentioned the possibility of referral for SPIRE Trial or other PCSK-9 Inhibitor treatment options.      Relevant Medications   lisinopril (PRINIVIL,ZESTRIL) 5 MG tablet   Other Relevant Orders   EKG 12-Lead   NSTEMI (non-ST elevated myocardial infarction) - Primary (Chronic)    No further symptoms of angina.  Mildly reduced EF, but no overt heart failure symptom.  She does have persistent exertional dyspnea but was a condition that predated her MRI.      Relevant Medications   lisinopril (PRINIVIL,ZESTRIL) 5 MG tablet   Other Relevant Orders   EKG 12-Lead      Your physician has recommended you make the following change in your medication: STOP isosorbide mononitrate (imdur)  In July, when you are finished with Effient, you can change back plavix 47m daily    Followup: 6 months    Hovanes Hymas, DLeonie Green M.D., M.S. Interventional Cardiologist   Pager # [phone]

## 2015-03-15 ENCOUNTER — Encounter: Payer: Self-pay | Admitting: Cardiology

## 2015-03-15 NOTE — Assessment & Plan Note (Signed)
Not on statin. Apparently has been intolerant in the past. I don't have results from PCP. I don't think that a statin would be a great idea for her.  We mentioned the possibility of referral for SPIRE Trial or other PCSK-9 Inhibitor treatment options.

## 2015-03-15 NOTE — Assessment & Plan Note (Addendum)
No further symptoms of angina.  Mildly reduced EF, but no overt heart failure symptom.  She does have persistent exertional dyspnea but was a condition that predated her MRI.

## 2015-03-15 NOTE — Assessment & Plan Note (Addendum)
No active heart failure symptoms.  Unless she were to have any worsening symptoms, we discussed the pros and cons of recheck an echocardiogram and decided just to simply not recheck an echo this time. She is on a dose of furosemide that is mostly uses a when necessary dose

## 2015-03-15 NOTE — Assessment & Plan Note (Signed)
She does have DES stents in the native circumflex as well as the anastomotic SVG-D1 lesion. Was found to be a Plavix nonresponder. For that reason I would want her to be on it it for at least a year. She had breathing issues with Brilinta.  Prefer not to be on Effient and a longer than need be. I think once we finished June she can restart Plavix at 75 mg. I would load her with 150 mg for her first dose.

## 2015-03-15 NOTE — Assessment & Plan Note (Signed)
She had PCI to the SVG-diagonal as well as PCI to the native circumflex. Note more active anginal symptoms. She is on aspirin, beta blocker, Imdur and Effient. I think with her borderline blood pressure, they would probably try to minimize her medications and hold/stop her Imdur unless she has more chest pain.

## 2015-03-16 ENCOUNTER — Telehealth: Payer: Self-pay | Admitting: *Deleted

## 2015-03-16 NOTE — Telephone Encounter (Signed)
SPOKE TO DAUGHTER. RN ASKED DAUGHTER TO VERIFY IF PATIENT IS TAKING LISINOPRIL  DAUGHTER STATES SHE WILL HAVE TO CHECK WHEN SHE GETS HOME AND CALL BACK WITH INFORMATION. RN VOICED UNDERSTANDING AND INSTRUCTED DAUGHTER TO CALL BACK AT [place].

## 2015-03-16 NOTE — Telephone Encounter (Signed)
DAUGHTER CALLED BACK. SHE STATES PATIENT IS TAKING LISINOPRIL 5 MG. IT WAS STARTED LAST HOSPITALIZATION. INFORMED DAUGHTER WILL DISCUSS WITH DR HARDING AND CONTACT DAUGHTER WITH INFORMATION.

## 2015-03-16 NOTE — Telephone Encounter (Signed)
-----   Message from Leonie Man, MD sent at 03/15/2015  9:38 PM EDT ----- Regarding: Lisinopril was added to her Meds - is she really taking? Ivin Booty -  Can you call Ms. Copus family - it looks like Lisinopril was added as a medication (I was not aware that she was on any ACE-I) -- was on her d/c med list from [facility] in July 2015 - but not on any clinic f/u med lists.  We just need to make sure that we are accurate.  [place]

## 2015-03-17 NOTE — Telephone Encounter (Signed)
Left message to call back  

## 2015-03-17 NOTE — Telephone Encounter (Signed)
Returning your call. °

## 2015-03-17 NOTE — Telephone Encounter (Signed)
I think she had noted feeling dizzy when I saw her.  If so, we should cut it back to 2.5 mg.  [facility]

## 2015-03-17 NOTE — Telephone Encounter (Signed)
Spoke to daughter. Decrease lisinopril 2.5 mg (1/2 tablet of 5 mg) Change medication list She voiced understanding.

## 2015-06-10 ENCOUNTER — Telehealth: Payer: Self-pay | Admitting: *Deleted

## 2015-06-10 MED ORDER — CLOPIDOGREL BISULFATE 75 MG PO TABS: 75.0000 mg | ORAL_TABLET | Freq: Every day | ORAL | Status: DC

## 2015-06-10 NOTE — Telephone Encounter (Signed)
-----   Message from Raiford Simmonds, RN sent at 03/16/2015 12:15 PM EDT ----- Regarding: CHANGE TO PLAVX CONTACT PATIENT TO CHANGE TO PLAVIX  FORM 03/13/15 OFFICE VISIT

## 2015-06-10 NOTE — Telephone Encounter (Signed)
Spoke to daughterKenney [name]  Informed [name] , that patient can discontinue Effient now and start Clopidogrel 75 mg ( plavix) one daily.THE FIRST DOSE TAKE 2 TABLETS ( 150 MG). Daughter aware medication is e-sent to pharmacy. [name] request a copy of medication list for patient ,she states the pharmacy calls her often to ask about medications. Fax number given [place] FAXED

## 2015-07-22 DIAGNOSIS — Z9181 History of falling: Secondary | ICD-10-CM | POA: Diagnosis not present

## 2015-07-22 DIAGNOSIS — Z139 Encounter for screening, unspecified: Secondary | ICD-10-CM | POA: Diagnosis not present

## 2015-07-22 DIAGNOSIS — N184 Chronic kidney disease, stage 4 (severe): Secondary | ICD-10-CM | POA: Diagnosis not present

## 2015-07-22 DIAGNOSIS — E039 Hypothyroidism, unspecified: Secondary | ICD-10-CM | POA: Diagnosis not present

## 2015-07-22 DIAGNOSIS — E1165 Type 2 diabetes mellitus with hyperglycemia: Secondary | ICD-10-CM | POA: Diagnosis not present

## 2015-07-22 DIAGNOSIS — I2510 Atherosclerotic heart disease of native coronary artery without angina pectoris: Secondary | ICD-10-CM | POA: Diagnosis not present

## 2015-07-22 DIAGNOSIS — I69351 Hemiplegia and hemiparesis following cerebral infarction affecting right dominant side: Secondary | ICD-10-CM | POA: Diagnosis not present

## 2015-08-04 ENCOUNTER — Emergency Department (HOSPITAL_COMMUNITY)
Admission: EM | Admit: 2015-08-04 | Discharge: 2015-08-04 | Disposition: A | Discharge status: Home / Self Care | Payer: Medicare Other | Attending: Emergency Medicine | Admitting: Emergency Medicine

## 2015-08-04 ENCOUNTER — Emergency Department (HOSPITAL_COMMUNITY): Payer: Medicare Other

## 2015-08-04 ENCOUNTER — Encounter (HOSPITAL_COMMUNITY): Payer: Self-pay | Admitting: *Deleted

## 2015-08-04 DIAGNOSIS — N390 Urinary tract infection, site not specified: Secondary | ICD-10-CM | POA: Diagnosis not present

## 2015-08-04 DIAGNOSIS — Z86718 Personal history of other venous thrombosis and embolism: Secondary | ICD-10-CM | POA: Diagnosis not present

## 2015-08-04 DIAGNOSIS — Z7982 Long term (current) use of aspirin: Secondary | ICD-10-CM | POA: Insufficient documentation

## 2015-08-04 DIAGNOSIS — E785 Hyperlipidemia, unspecified: Secondary | ICD-10-CM | POA: Insufficient documentation

## 2015-08-04 DIAGNOSIS — E039 Hypothyroidism, unspecified: Secondary | ICD-10-CM | POA: Diagnosis not present

## 2015-08-04 DIAGNOSIS — F0390 Unspecified dementia without behavioral disturbance: Secondary | ICD-10-CM | POA: Diagnosis not present

## 2015-08-04 DIAGNOSIS — I5042 Chronic combined systolic (congestive) and diastolic (congestive) heart failure: Secondary | ICD-10-CM | POA: Diagnosis not present

## 2015-08-04 DIAGNOSIS — Z86010 Personal history of colonic polyps: Secondary | ICD-10-CM | POA: Insufficient documentation

## 2015-08-04 DIAGNOSIS — R410 Disorientation, unspecified: Secondary | ICD-10-CM

## 2015-08-04 DIAGNOSIS — Z853 Personal history of malignant neoplasm of breast: Secondary | ICD-10-CM | POA: Insufficient documentation

## 2015-08-04 DIAGNOSIS — Z79899 Other long term (current) drug therapy: Secondary | ICD-10-CM | POA: Insufficient documentation

## 2015-08-04 DIAGNOSIS — Z8673 Personal history of transient ischemic attack (TIA), and cerebral infarction without residual deficits: Secondary | ICD-10-CM | POA: Diagnosis not present

## 2015-08-04 DIAGNOSIS — E119 Type 2 diabetes mellitus without complications: Secondary | ICD-10-CM | POA: Diagnosis not present

## 2015-08-04 DIAGNOSIS — R4182 Altered mental status, unspecified: Secondary | ICD-10-CM | POA: Diagnosis present

## 2015-08-04 DIAGNOSIS — I2510 Atherosclerotic heart disease of native coronary artery without angina pectoris: Secondary | ICD-10-CM | POA: Insufficient documentation

## 2015-08-04 DIAGNOSIS — N182 Chronic kidney disease, stage 2 (mild): Secondary | ICD-10-CM | POA: Insufficient documentation

## 2015-08-04 DIAGNOSIS — I129 Hypertensive chronic kidney disease with stage 1 through stage 4 chronic kidney disease, or unspecified chronic kidney disease: Secondary | ICD-10-CM | POA: Diagnosis not present

## 2015-08-04 DIAGNOSIS — Z951 Presence of aortocoronary bypass graft: Secondary | ICD-10-CM | POA: Diagnosis not present

## 2015-08-04 DIAGNOSIS — Z7902 Long term (current) use of antithrombotics/antiplatelets: Secondary | ICD-10-CM | POA: Insufficient documentation

## 2015-08-04 DIAGNOSIS — R40241 Glasgow coma scale score 13-15: Secondary | ICD-10-CM | POA: Diagnosis not present

## 2015-08-04 LAB — COMPREHENSIVE METABOLIC PANEL
ALBUMIN: 3.3 g/dL — AB (ref 3.5–5.0)
ALT: 20 U/L (ref 14–54)
ANION GAP: 10 (ref 5–15)
AST: 22 U/L (ref 15–41)
Alkaline Phosphatase: 96 U/L (ref 38–126)
BUN: 13 mg/dL (ref 6–20)
CO2: 24 mmol/L (ref 22–32)
Calcium: 8.6 mg/dL — ABNORMAL LOW (ref 8.9–10.3)
Chloride: 103 mmol/L (ref 101–111)
Creatinine, Ser: 1.21 mg/dL — ABNORMAL HIGH (ref 0.44–1.00)
GFR calc non Af Amer: 42 mL/min — ABNORMAL LOW (ref >60)
GFR, EST AFRICAN AMERICAN: 49 mL/min — AB (ref >60)
GLUCOSE: 161 mg/dL — AB (ref 65–99)
POTASSIUM: 4.1 mmol/L (ref 3.5–5.1)
SODIUM: 137 mmol/L (ref 135–145)
Total Bilirubin: 1.1 mg/dL (ref 0.3–1.2)
Total Protein: 6.3 g/dL — ABNORMAL LOW (ref 6.5–8.1)

## 2015-08-04 LAB — URINALYSIS, ROUTINE W REFLEX MICROSCOPIC
Bilirubin Urine: NEGATIVE
Glucose, UA: 100 mg/dL — AB
HGB URINE DIPSTICK: NEGATIVE
KETONES UR: 15 mg/dL — AB
Nitrite: NEGATIVE
PROTEIN: NEGATIVE mg/dL
Specific Gravity, Urine: 1.024 (ref 1.005–1.030)
UROBILINOGEN UA: 1 mg/dL (ref 0.0–1.0)
pH: 6 (ref 5.0–8.0)

## 2015-08-04 LAB — DIFFERENTIAL
BASOS PCT: 0 % (ref 0–1)
Basophils Absolute: 0 10*3/uL (ref 0.0–0.1)
EOS ABS: 0 10*3/uL (ref 0.0–0.7)
EOS PCT: 0 % (ref 0–5)
Lymphocytes Relative: 12 % (ref 12–46)
Lymphs Abs: 1.2 10*3/uL (ref 0.7–4.0)
Monocytes Absolute: 0.4 10*3/uL (ref 0.1–1.0)
Monocytes Relative: 4 % (ref 3–12)
NEUTROS PCT: 84 % — AB (ref 43–77)
Neutro Abs: 8.4 10*3/uL — ABNORMAL HIGH (ref 1.7–7.7)

## 2015-08-04 LAB — CBC
HCT: 38.6 % (ref 36.0–46.0)
Hemoglobin: 13.1 g/dL (ref 12.0–15.0)
MCH: 31.6 pg (ref 26.0–34.0)
MCHC: 33.9 g/dL (ref 30.0–36.0)
MCV: 93.2 fL (ref 78.0–100.0)
PLATELETS: 166 10*3/uL (ref 150–400)
RBC: 4.14 MIL/uL (ref 3.87–5.11)
RDW: 12.8 % (ref 11.5–15.5)
WBC: 10.1 10*3/uL (ref 4.0–10.5)

## 2015-08-04 LAB — I-STAT TROPONIN, ED: Troponin i, poc: 0.01 ng/mL (ref 0.00–0.08)

## 2015-08-04 LAB — URINE MICROSCOPIC-ADD ON

## 2015-08-04 LAB — PROTIME-INR
INR: 1.07 (ref 0.00–1.49)
PROTHROMBIN TIME: 14.1 s (ref 11.6–15.2)

## 2015-08-04 LAB — APTT: aPTT: 27 seconds (ref 24–37)

## 2015-08-04 LAB — ETHANOL

## 2015-08-04 MED ORDER — CEPHALEXIN 500 MG PO CAPS: 500.0000 mg | ORAL_CAPSULE | Freq: Four times a day (QID) | ORAL | Status: DC

## 2015-08-04 MED ORDER — CEPHALEXIN 250 MG PO CAPS
500.0000 mg | ORAL_CAPSULE | Freq: Once | ORAL | Status: AC
  Administered 2015-08-04: 500 mg via ORAL
  Filled 2015-08-04: qty 2

## 2015-08-04 NOTE — ED Provider Notes (Signed)
CSN: [id number]     Arrival date & time 08/04/15  1842 History   First MD Initiated Contact with Patient 08/04/15 1843     Chief Complaint  Patient presents with  . Altered Mental Status   HPI Patient was brought into the emergency room for evaluation of confusion and difficulty with her speech. The exact onset is unknown. The patient's husband states that his daughter told him that the patient was confused today. She also started having episodes of nausea and vomiting. When the husband went to speak with the patient he says that she was not making sense. He also says that her speech was slurred and difficult to understand. EMS was called. Here in the emergency room the patient denies any complaints. She does have a history of dementia and is confused but states she is not having any chest pain or shortness of breath. She did have a headache earlier. She denies any trouble with abdominal pain nausea or vomiting. Past Medical History  Diagnosis Date  . CAD (coronary artery disease), native coronary artery 2004    a) 6/'16: Complicated PCI-of pLAD wtih PTCA  (failed redo PTCA) --> CABG 06/2003 for ISR LAD & D1 (LIMA-LAD, SVG-dRCA, SVG-D1);; b) 4/'07: Patent grafts, Normal RHC pressures; c) NSTEMI 9/'10: No culprit; 1/'12: recanalized LAD stent, patent grafts  . S/P CABG x 3 06/2003    LIMA-LAD, SVG-D1, SVG-dRCA  . Cardiomyopathy secondary to chemotherapy 07/2009    a) Combined Ischemic & Non-ischemic (Adriamycin from [facility])EF ~40% by Cath; by Echo in 08/2010 35-40%;; b) followup echo August 2013: EF 50-55%, aortic sclerosis without stenosis. Grade 1 diastolic dysfunction  . Chronic combined systolic and diastolic congestive heart failure, NYHA class 2 07/2009    Mixed. 2D-echocardiogram (08/2010) - 35% to 40%. Diffuse hypokinesis. Grade 2 diastolic dysfunction.  . Atherosclerosis of autologous vein coronary artery bypass graft with unstable angina pectoris     CATH: 80% prox SVG-D1, 95% anastomotic  SVG-D1 lesion  . CAD S/P percutaneous coronary angioplasty 06/05/2014    a) 1/04: BMS x 2 LAD, PTCA D1 followed by unusuccessful redo PTCA D1 --> initially turned down CABG initially then CABG in 06/2003;; b) 05/2014: DES to Prox Cx & SVG-D1; PTCA of Anastomotic SVG-D1 lesion  . CKD (chronic kidney disease) stage 2, GFR 60-89 ml/min     BL Cr 1.6  . History of renal artery stenosis 2010    Left 95% stenosis s/p left renal artery stenting (09/2009), right renal artery with 50% stenosis.  [name] History of CVA (cerebrovascular accident) 11/2009; 8& 04/2009    left frontal lobe stroke (11/2009), TIA (06/2009, 04/2009) with multiple prior TIAs manifested by aphasia. H/o CVA in 1988 involving right eye.  [name] Hx of adenomatous colonic polyps     remote history. Last colonoscopy (03/2004) - Normal colonoscopy to the cecum, rec 5 year follow-up colonoscopy.  [name] History of DVT of lower extremity 07/2003    in setting of saphenous vein grafting for CABG  . Essential hypertension 03/12/2012  . DM (diabetes mellitus), type 2, uncontrolled with complications DX: 0737    Stopped of Metformin in 04/2011 in setting of AKI (Cr 3), now on insulin.  [name] Hyperlipidemia with target LDL less than 70   . DJD (degenerative joint disease) of knee     left  . History of breast cancer     Primary oncologist Dr. Brigitte Pulse. // First diagnosed 2000, recurrence in 06/2010. S/P left mastectomy with radiation therapy (2000), following  recurrence, started chemothearpy, completed course (06/2010.)  . Dementia   . Hypothyroidism    Past Surgical History  Procedure Laterality Date  . Total knee arthroplasty  12/2004, 12/2005    BL, right (2006) by Dr. Berenice Primas. Left (2007) by Dr. Ronnie Derby.  . Simple mastectomy  2000    left, in setting of breast cancer  . Total abdominal hysterectomy    . Lumbar disc surgery    . Renal artery angioplasty Left 10/05/2009    95% renal artery stenosis. Predilated with a 4x15 Aviator balloon, stented with a  5x15 Genesis and Aviator balloon stent with dilatatin at 10 atm. Resulting in reduction of 95% to 0% with excellent flow.  . Renal doppler  10/23/2011    Bilateral Renal Artery - No evidence of significant diameter reduction, tortuosity, or any other vascular abnormality/  . Coronary artery bypass graft  07/23/2003    x3. LIMA to LAD, SVG to diagonal, SVG to RCA.  . Cardiac catheterization  12/10/2002    Successful stenting of LAD with Cutting Balloon angioplasty of diagonal #1. There was a residual type B dissection in first diagonal branch.   . Cardiac catheterization  01/14/2003    Ostial 80% narrowing of the LAD - procedure was terminated after 5 unsuccessful attempts to pass the balloon into the stents and down to the lesion.  . Cardiac catheterization  07/22/2003    Recommended CABG revascularization  . Cardiac catheterization  03/01/2006    Low, normal LV systolic function, normal pulmonary artery pressures, and patent grafts.  . Cardiac catheterization  02/19/2008    Continue current medications and treatment plan.  . Cardiac catheterization  08/19/2009    Patent grafts with normal LV function  . Cardiac catheterization  11/25/2010    40% EF with anterior and distal inferior wall motion abnormalities. Patient grafts. Progression of diagonal and LAD disease.  . Cardiovascular stress test  09/30/2009    No scintigraphic evidence of inducible myocardial ischemia. No ECG changes. EKG negative for ischemia.  . Transthoracic echocardiogram  03/13/2012    EF 50-55%, mild focal basal hypertrophy of the septum. Abnormal LV relaxation (grade 1 diastolic dysfunction).  . Left heart catheterization with coronary/graft angiogram N/A 06/05/2014    Procedure: LEFT HEART CATHETERIZATION WITH Beatrix Fetters;  Surgeon: Leonie Man, MD;  Location: [facility] CATH LAB;  Service: Cardiovascular;  Laterality: N/A;   Family History  Problem Relation Age of Onset  . Heart disease Mother   . Diabetes Mother    . Heart disease Father   . Cancer Father     colon cancer  . Heart disease Sister   . Heart disease Brother   . Cancer Brother     prostate cancer  . Heart disease Brother   . Cancer Sister     breast cancer   Social History  Substance Use Topics  . Smoking status: Never Smoker   . Smokeless tobacco: Never Used  . Alcohol Use: No   OB History    No data available     Review of Systems  All other systems reviewed and are negative.     Allergies  Brilinta; Plavix; and Statins  Home Medications   Prior to Admission medications   Medication Sig Start Date End Date Taking? Authorizing Provider  aspirin EC 81 MG EC tablet Take 1 tablet (81 mg total) by mouth daily. 06/08/14  Yes Luke K Kilroy, PA-C  atorvastatin (LIPITOR) 80 MG tablet Take 80 mg by mouth daily.  07/09/15  Yes Historical Provider, MD  clopidogrel (PLAVIX) 75 MG tablet Take 1 tablet (75 mg total) by mouth daily. 06/10/15  Yes Leonie Man, MD  ibuprofen (ADVIL,MOTRIN) 200 MG tablet Take 600 mg by mouth daily as needed for headache.   Yes Historical Provider, MD  LANTUS SOLOSTAR 100 UNIT/ML Solostar Pen Inject 90 Units into the skin every morning. Taper up to 110 units as directed until fasting glucose <130 06/19/15  Yes Historical Provider, MD  metFORMIN (GLUCOPHAGE-XR) 500 MG 24 hr tablet Take 1,000 mg by mouth every evening.  07/22/15  Yes Historical Provider, MD  metoprolol tartrate (LOPRESSOR) 25 MG tablet Take 25 mg by mouth daily.    Yes Historical Provider, MD  nitroGLYCERIN (NITROSTAT) 0.4 MG SL tablet Place 1 tablet (0.4 mg total) under the tongue every 5 (five) minutes x 3 doses as needed for chest pain. 06/08/14  Yes Luke K Kilroy, PA-C  SYNTHROID 100 MCG tablet Take 100 mcg by mouth daily.  03/12/15  Yes Historical Provider, MD  cephALEXin (KEFLEX) 500 MG capsule Take 1 capsule (500 mg total) by mouth 4 (four) times daily. 08/04/15   Dorie Rank, MD  furosemide (LASIX) 20 MG tablet Take 20 mg by mouth daily  as needed for fluid or edema.    Historical Provider, MD  pantoprazole (PROTONIX) 40 MG tablet Take 1 tablet (40 mg total) by mouth daily. Patient not taking: Reported on 08/04/2015 06/27/14   Doreene Burke Kilroy, PA-C   BP 128/66 mmHg  Pulse 58  Temp(Src) 98.4 F (36.9 C) (Oral)  Resp 17  SpO2 100% Physical Exam  Constitutional: She appears well-developed and well-nourished. No distress.  HENT:  Head: Normocephalic and atraumatic.  Right Ear: External ear normal.  Left Ear: External ear normal.  Mouth/Throat: Oropharynx is clear and moist.  Eyes: Conjunctivae are normal. Right eye exhibits no discharge. Left eye exhibits no discharge. No scleral icterus.  Neck: Neck supple. No tracheal deviation present.  Cardiovascular: Normal rate, regular rhythm and intact distal pulses.   Pulmonary/Chest: Effort normal and breath sounds normal. No stridor. No respiratory distress. She has no wheezes. She has no rales.  Abdominal: Soft. Bowel sounds are normal. She exhibits no distension. There is no tenderness. There is no rebound and no guarding.  Musculoskeletal: She exhibits no edema or tenderness.  Neurological: She is alert. She has normal strength. She is disoriented (patient is oriented to person and place but does not know what the date is and tells me her age is in her 31s). No cranial nerve deficit (no facial droop, extraocular movements intact, no slurred speech) or sensory deficit. She exhibits normal muscle tone. She displays no seizure activity. Coordination normal.  No pronator drift bilateral upper extrem, able to hold both legs off bed for 5 seconds, sensation intact in all extremities, no visual field cuts, no left or right sided neglect,  no nystagmus noted   Skin: Skin is warm and dry. No rash noted. She is not diaphoretic.  Psychiatric: She has a normal mood and affect.  Nursing note and vitals reviewed.   ED Course  Procedures (including critical care time) Labs Review Labs  Reviewed  DIFFERENTIAL - Abnormal; Notable for the following:    Neutrophils Relative % 84 (*)    Neutro Abs 8.4 (*)    All other components within normal limits  COMPREHENSIVE METABOLIC PANEL - Abnormal; Notable for the following:    Glucose, Bld 161 (*)    Creatinine, Ser 1.21 (*)  Calcium 8.6 (*)    Total Protein 6.3 (*)    Albumin 3.3 (*)    GFR calc non Af Amer 42 (*)    GFR calc Af Amer 49 (*)    All other components within normal limits  URINALYSIS, ROUTINE W REFLEX MICROSCOPIC (NOT AT [facility]) - Abnormal; Notable for the following:    APPearance CLOUDY (*)    Glucose, UA 100 (*)    Ketones, ur 15 (*)    Leukocytes, UA MODERATE (*)    All other components within normal limits  URINE MICROSCOPIC-ADD ON - Abnormal; Notable for the following:    Squamous Epithelial / LPF MANY (*)    Bacteria, UA FEW (*)    All other components within normal limits  URINE CULTURE  PROTIME-INR  APTT  CBC  ETHANOL  I-STAT TROPOININ, ED    Imaging Review Ct Head Wo Contrast  08/04/2015   CLINICAL DATA:  Confusion and speech difficulty 2 hours prior to admission. Nausea and vomiting.  EXAM: CT HEAD WITHOUT CONTRAST  TECHNIQUE: Contiguous axial images were obtained from the base of the skull through the vertex without intravenous contrast.  COMPARISON:  03/12/2012  FINDINGS: Ventricles, cisterns and other CSF spaces are within normal. Evidence of small old bifrontal infarcts. Evidence of mild chronic ischemic microvascular disease. No focal mass, mass effect, shift of midline structures or acute hemorrhage. No evidence of acute infarction. There is chronic opacification of the left sphenoid sinus. Remaining bones and soft tissues are within normal.  IMPRESSION: No acute intracranial findings.  Chronic ischemic microvascular disease and small old bifrontal infarcts.  Chronic inflammatory change of the left sphenoid sinus.   Electronically Signed   By: Marin Olp M.D.   On: 08/04/2015 21:44   I have  personally reviewed and evaluated these images and lab results as part of my medical decision-making.   EKG Interpretation   Date/Time:  Tuesday August 04 2015 19:28:47 EDT Ventricular Rate:  55 PR Interval:  185 QRS Duration: 113 QT Interval:  440 QTC Calculation: 421 R Axis:   -53 Text Interpretation:  Sinus rhythm Incomplete left bundle branch block LVH  w/ repol abnormalities No significant change since last tracing Confirmed  by Jerick Khachatryan  MD-J, Patra Gherardi ([id number]) on 08/04/2015 7:46:44 PM      MDM   Final diagnoses:  UTI (lower urinary tract infection)  Confusion    Pt has been monitored in the ED for several hours.   No slurred speech, aphasia or other neurologic abnormalities noted.  UA shows a possible UTI.  Difficult to say though as the patient does not have any specific symptoms.  She does have dementia which makes it a little difficult.    I had a long discussion with the family about the possibility of overnight observation for TIA/stroke evaluation. It is possible her symptoms could be related to urinary tract infection as well. Patient would prefer to go home. Family is comfortable with this plan. I do not think this is unreasonable as she has no neuro deficits on my exam.  Pt is able to have close follow up with her PCP   Dorie Rank, MD 08/04/15 2259

## 2015-08-04 NOTE — Discharge Instructions (Signed)

## 2015-08-04 NOTE — ED Notes (Signed)
Pt ambulating independently w/ steady gait on d/c in no acute distress, alert. D/c instructions reviewed w/ pt and family - pt and family deny any further questions or concerns at present. Rx given x1

## 2015-08-04 NOTE — ED Notes (Signed)
Patient transported to CT 

## 2015-08-04 NOTE — ED Notes (Signed)
Per husband at bedside, pt had an episode of slurred speech and NV approx 2 hours pta. Hx of CVA and dementia; alert to self and place only, able to follow commands. Speech is clear.

## 2015-08-04 NOTE — ED Notes (Signed)
Pt to ED via [facility] c/o altered mental status. Family called EMS for altered mental status; hx of dementia; reports at baseline pt alert & oriented x3. Pt alert to name, states age is "81" Hx of dementia and CVA. Denies pain

## 2015-08-06 DIAGNOSIS — Z6828 Body mass index (BMI) 28.0-28.9, adult: Secondary | ICD-10-CM | POA: Diagnosis not present

## 2015-08-06 DIAGNOSIS — N390 Urinary tract infection, site not specified: Secondary | ICD-10-CM | POA: Diagnosis not present

## 2015-08-06 DIAGNOSIS — R410 Disorientation, unspecified: Secondary | ICD-10-CM | POA: Diagnosis not present

## 2015-08-06 DIAGNOSIS — I69351 Hemiplegia and hemiparesis following cerebral infarction affecting right dominant side: Secondary | ICD-10-CM | POA: Diagnosis not present

## 2015-08-06 LAB — URINE CULTURE

## 2016-04-12 DIAGNOSIS — N183 Chronic kidney disease, stage 3 (moderate): Secondary | ICD-10-CM | POA: Diagnosis not present

## 2016-04-12 DIAGNOSIS — Z1389 Encounter for screening for other disorder: Secondary | ICD-10-CM | POA: Diagnosis not present

## 2016-04-12 DIAGNOSIS — E1165 Type 2 diabetes mellitus with hyperglycemia: Secondary | ICD-10-CM | POA: Diagnosis not present

## 2016-04-12 DIAGNOSIS — E039 Hypothyroidism, unspecified: Secondary | ICD-10-CM | POA: Diagnosis not present

## 2016-06-14 ENCOUNTER — Other Ambulatory Visit: Payer: Self-pay | Admitting: Cardiology

## 2016-06-14 NOTE — Telephone Encounter (Signed)
REFILL 

## 2016-06-24 ENCOUNTER — Other Ambulatory Visit: Payer: Self-pay | Admitting: Cardiology

## 2016-06-27 NOTE — Telephone Encounter (Signed)
Rx(s) sent to pharmacy electronically.  

## 2016-07-11 ENCOUNTER — Other Ambulatory Visit: Payer: Self-pay | Admitting: Cardiology

## 2016-07-11 NOTE — Telephone Encounter (Signed)
Rx request sent to pharmacy.  

## 2016-07-19 DIAGNOSIS — E119 Type 2 diabetes mellitus without complications: Secondary | ICD-10-CM | POA: Diagnosis not present

## 2016-07-19 DIAGNOSIS — I10 Essential (primary) hypertension: Secondary | ICD-10-CM | POA: Diagnosis not present

## 2016-07-19 DIAGNOSIS — E039 Hypothyroidism, unspecified: Secondary | ICD-10-CM | POA: Diagnosis not present

## 2016-07-19 DIAGNOSIS — I2510 Atherosclerotic heart disease of native coronary artery without angina pectoris: Secondary | ICD-10-CM | POA: Diagnosis not present

## 2016-07-19 DIAGNOSIS — E663 Overweight: Secondary | ICD-10-CM | POA: Diagnosis not present

## 2016-08-03 ENCOUNTER — Other Ambulatory Visit: Payer: Self-pay | Admitting: Cardiology

## 2016-08-15 DIAGNOSIS — L0390 Cellulitis, unspecified: Secondary | ICD-10-CM | POA: Diagnosis not present

## 2016-08-15 DIAGNOSIS — Z9181 History of falling: Secondary | ICD-10-CM | POA: Diagnosis not present

## 2016-08-15 DIAGNOSIS — Z139 Encounter for screening, unspecified: Secondary | ICD-10-CM | POA: Diagnosis not present

## 2016-09-05 ENCOUNTER — Other Ambulatory Visit: Payer: Self-pay | Admitting: Cardiology

## 2016-09-05 NOTE — Telephone Encounter (Signed)
REFILL 

## 2016-10-18 ENCOUNTER — Other Ambulatory Visit: Payer: Self-pay | Admitting: Cardiology

## 2016-10-22 ENCOUNTER — Other Ambulatory Visit: Payer: Self-pay | Admitting: Cardiology

## 2016-12-05 ENCOUNTER — Other Ambulatory Visit: Payer: Self-pay | Admitting: Cardiology

## 2016-12-05 NOTE — Telephone Encounter (Signed)
Rx(s) sent to pharmacy electronically.  

## 2017-01-03 ENCOUNTER — Other Ambulatory Visit: Payer: Self-pay | Admitting: Cardiology

## 2017-01-03 NOTE — Telephone Encounter (Signed)
REFILL 

## 2017-01-27 ENCOUNTER — Other Ambulatory Visit: Payer: Self-pay | Admitting: Cardiology

## 2017-01-30 ENCOUNTER — Telehealth: Payer: Self-pay | Admitting: Cardiology

## 2017-01-30 NOTE — Telephone Encounter (Signed)
Tried to call [facility],[name] she states that she is driving and hangs up. Will call later  Pt needs to schedule follow up appt for additional refills-2 years since seen.

## 2017-01-30 NOTE — Telephone Encounter (Signed)
Please call question about her Clopidogrel refill that was sent in on Friday.

## 2017-02-02 MED ORDER — CLOPIDOGREL BISULFATE 75 MG PO TABS: 75.0000 mg | ORAL_TABLET | Freq: Every day | ORAL | 1 refills | Status: DC

## 2017-02-02 NOTE — Telephone Encounter (Signed)
Left message for pt to call.

## 2017-02-02 NOTE — Telephone Encounter (Signed)
Spoke with pt dtr, tonya, she questioned if the patient needed to continue plavix prior to them getting a refill. The patient has not seen dr harding since 2016. Follow up scheduled and advised to keep patient on plavix until seen. New script sent to the pharmacy

## 2017-02-28 ENCOUNTER — Ambulatory Visit: Payer: Medicare Other | Admitting: Cardiology

## 2017-03-16 ENCOUNTER — Other Ambulatory Visit: Payer: Self-pay | Admitting: Cardiology

## 2017-03-16 NOTE — Telephone Encounter (Signed)
Rx has been sent to the pharmacy electronically. ° °

## 2017-05-15 ENCOUNTER — Other Ambulatory Visit: Payer: Self-pay | Admitting: Cardiology

## 2018-02-09 ENCOUNTER — Inpatient Hospital Stay (HOSPITAL_COMMUNITY)
Admission: EM | Admit: 2018-02-09 | Discharge: 2018-02-13 | DRG: 064 | Disposition: A | Discharge status: Rehabilitation Facility | Payer: Medicare Other | Attending: Family Medicine | Admitting: Family Medicine

## 2018-02-09 ENCOUNTER — Inpatient Hospital Stay (HOSPITAL_COMMUNITY): Payer: Medicare Other

## 2018-02-09 ENCOUNTER — Emergency Department (HOSPITAL_COMMUNITY): Payer: Medicare Other

## 2018-02-09 ENCOUNTER — Other Ambulatory Visit: Payer: Self-pay

## 2018-02-09 DIAGNOSIS — R451 Restlessness and agitation: Secondary | ICD-10-CM | POA: Diagnosis not present

## 2018-02-09 DIAGNOSIS — Z803 Family history of malignant neoplasm of breast: Secondary | ICD-10-CM

## 2018-02-09 DIAGNOSIS — I447 Left bundle-branch block, unspecified: Secondary | ICD-10-CM | POA: Diagnosis present

## 2018-02-09 DIAGNOSIS — Y92230 Patient room in hospital as the place of occurrence of the external cause: Secondary | ICD-10-CM | POA: Diagnosis not present

## 2018-02-09 DIAGNOSIS — E1122 Type 2 diabetes mellitus with diabetic chronic kidney disease: Secondary | ICD-10-CM | POA: Diagnosis present

## 2018-02-09 DIAGNOSIS — F0390 Unspecified dementia without behavioral disturbance: Secondary | ICD-10-CM | POA: Diagnosis present

## 2018-02-09 DIAGNOSIS — Z9181 History of falling: Secondary | ICD-10-CM | POA: Diagnosis not present

## 2018-02-09 DIAGNOSIS — R29810 Facial weakness: Secondary | ICD-10-CM | POA: Diagnosis present

## 2018-02-09 DIAGNOSIS — I69312 Visuospatial deficit and spatial neglect following cerebral infarction: Secondary | ICD-10-CM | POA: Diagnosis not present

## 2018-02-09 DIAGNOSIS — Z955 Presence of coronary angioplasty implant and graft: Secondary | ICD-10-CM

## 2018-02-09 DIAGNOSIS — I130 Hypertensive heart and chronic kidney disease with heart failure and stage 1 through stage 4 chronic kidney disease, or unspecified chronic kidney disease: Secondary | ICD-10-CM | POA: Diagnosis present

## 2018-02-09 DIAGNOSIS — N390 Urinary tract infection, site not specified: Secondary | ICD-10-CM | POA: Diagnosis present

## 2018-02-09 DIAGNOSIS — I63512 Cerebral infarction due to unspecified occlusion or stenosis of left middle cerebral artery: Secondary | ICD-10-CM | POA: Diagnosis not present

## 2018-02-09 DIAGNOSIS — N182 Chronic kidney disease, stage 2 (mild): Secondary | ICD-10-CM | POA: Diagnosis present

## 2018-02-09 DIAGNOSIS — Z8673 Personal history of transient ischemic attack (TIA), and cerebral infarction without residual deficits: Secondary | ICD-10-CM

## 2018-02-09 DIAGNOSIS — Z9221 Personal history of antineoplastic chemotherapy: Secondary | ICD-10-CM

## 2018-02-09 DIAGNOSIS — I25119 Atherosclerotic heart disease of native coronary artery with unspecified angina pectoris: Secondary | ICD-10-CM | POA: Diagnosis not present

## 2018-02-09 DIAGNOSIS — R4701 Aphasia: Secondary | ICD-10-CM | POA: Diagnosis present

## 2018-02-09 DIAGNOSIS — T434X5A Adverse effect of butyrophenone and thiothixene neuroleptics, initial encounter: Secondary | ICD-10-CM | POA: Diagnosis not present

## 2018-02-09 DIAGNOSIS — Z90710 Acquired absence of both cervix and uterus: Secondary | ICD-10-CM

## 2018-02-09 DIAGNOSIS — G8191 Hemiplegia, unspecified affecting right dominant side: Secondary | ICD-10-CM | POA: Diagnosis not present

## 2018-02-09 DIAGNOSIS — Z888 Allergy status to other drugs, medicaments and biological substances status: Secondary | ICD-10-CM | POA: Diagnosis not present

## 2018-02-09 DIAGNOSIS — R9401 Abnormal electroencephalogram [EEG]: Secondary | ICD-10-CM | POA: Diagnosis present

## 2018-02-09 DIAGNOSIS — I63412 Cerebral infarction due to embolism of left middle cerebral artery: Principal | ICD-10-CM | POA: Diagnosis present

## 2018-02-09 DIAGNOSIS — I69351 Hemiplegia and hemiparesis following cerebral infarction affecting right dominant side: Secondary | ICD-10-CM | POA: Diagnosis not present

## 2018-02-09 DIAGNOSIS — I252 Old myocardial infarction: Secondary | ICD-10-CM | POA: Diagnosis not present

## 2018-02-09 DIAGNOSIS — Z791 Long term (current) use of non-steroidal anti-inflammatories (NSAID): Secondary | ICD-10-CM

## 2018-02-09 DIAGNOSIS — I2510 Atherosclerotic heart disease of native coronary artery without angina pectoris: Secondary | ICD-10-CM | POA: Diagnosis present

## 2018-02-09 DIAGNOSIS — Z923 Personal history of irradiation: Secondary | ICD-10-CM

## 2018-02-09 DIAGNOSIS — I25118 Atherosclerotic heart disease of native coronary artery with other forms of angina pectoris: Secondary | ICD-10-CM | POA: Diagnosis not present

## 2018-02-09 DIAGNOSIS — I255 Ischemic cardiomyopathy: Secondary | ICD-10-CM | POA: Diagnosis present

## 2018-02-09 DIAGNOSIS — R29707 NIHSS score 7: Secondary | ICD-10-CM | POA: Diagnosis present

## 2018-02-09 DIAGNOSIS — Z7982 Long term (current) use of aspirin: Secondary | ICD-10-CM

## 2018-02-09 DIAGNOSIS — E785 Hyperlipidemia, unspecified: Secondary | ICD-10-CM | POA: Diagnosis present

## 2018-02-09 DIAGNOSIS — X58XXXA Exposure to other specified factors, initial encounter: Secondary | ICD-10-CM | POA: Diagnosis not present

## 2018-02-09 DIAGNOSIS — E039 Hypothyroidism, unspecified: Secondary | ICD-10-CM | POA: Diagnosis present

## 2018-02-09 DIAGNOSIS — Z66 Do not resuscitate: Secondary | ICD-10-CM | POA: Diagnosis present

## 2018-02-09 DIAGNOSIS — G9341 Metabolic encephalopathy: Secondary | ICD-10-CM | POA: Diagnosis present

## 2018-02-09 DIAGNOSIS — Z8249 Family history of ischemic heart disease and other diseases of the circulatory system: Secondary | ICD-10-CM

## 2018-02-09 DIAGNOSIS — E1165 Type 2 diabetes mellitus with hyperglycemia: Secondary | ICD-10-CM | POA: Diagnosis present

## 2018-02-09 DIAGNOSIS — I5042 Chronic combined systolic (congestive) and diastolic (congestive) heart failure: Secondary | ICD-10-CM | POA: Diagnosis present

## 2018-02-09 DIAGNOSIS — Z515 Encounter for palliative care: Secondary | ICD-10-CM

## 2018-02-09 DIAGNOSIS — Z951 Presence of aortocoronary bypass graft: Secondary | ICD-10-CM

## 2018-02-09 DIAGNOSIS — I420 Dilated cardiomyopathy: Secondary | ICD-10-CM | POA: Diagnosis not present

## 2018-02-09 DIAGNOSIS — I351 Nonrheumatic aortic (valve) insufficiency: Secondary | ICD-10-CM | POA: Diagnosis not present

## 2018-02-09 DIAGNOSIS — I69392 Facial weakness following cerebral infarction: Secondary | ICD-10-CM | POA: Diagnosis not present

## 2018-02-09 DIAGNOSIS — E1159 Type 2 diabetes mellitus with other circulatory complications: Secondary | ICD-10-CM | POA: Diagnosis not present

## 2018-02-09 DIAGNOSIS — Z800 Family history of malignant neoplasm of digestive organs: Secondary | ICD-10-CM

## 2018-02-09 DIAGNOSIS — Z86718 Personal history of other venous thrombosis and embolism: Secondary | ICD-10-CM

## 2018-02-09 DIAGNOSIS — Z79899 Other long term (current) drug therapy: Secondary | ICD-10-CM

## 2018-02-09 DIAGNOSIS — Z7984 Long term (current) use of oral hypoglycemic drugs: Secondary | ICD-10-CM

## 2018-02-09 DIAGNOSIS — Z9012 Acquired absence of left breast and nipple: Secondary | ICD-10-CM

## 2018-02-09 DIAGNOSIS — R001 Bradycardia, unspecified: Secondary | ICD-10-CM | POA: Diagnosis present

## 2018-02-09 DIAGNOSIS — Z7189 Other specified counseling: Secondary | ICD-10-CM | POA: Diagnosis not present

## 2018-02-09 DIAGNOSIS — R569 Unspecified convulsions: Secondary | ICD-10-CM | POA: Diagnosis not present

## 2018-02-09 DIAGNOSIS — R414 Neurologic neglect syndrome: Secondary | ICD-10-CM | POA: Diagnosis present

## 2018-02-09 DIAGNOSIS — Z853 Personal history of malignant neoplasm of breast: Secondary | ICD-10-CM

## 2018-02-09 DIAGNOSIS — I639 Cerebral infarction, unspecified: Secondary | ICD-10-CM | POA: Diagnosis present

## 2018-02-09 DIAGNOSIS — Z79890 Hormone replacement therapy (postmenopausal): Secondary | ICD-10-CM

## 2018-02-09 DIAGNOSIS — M109 Gout, unspecified: Secondary | ICD-10-CM | POA: Diagnosis present

## 2018-02-09 DIAGNOSIS — Z7902 Long term (current) use of antithrombotics/antiplatelets: Secondary | ICD-10-CM

## 2018-02-09 DIAGNOSIS — I10 Essential (primary) hypertension: Secondary | ICD-10-CM | POA: Diagnosis not present

## 2018-02-09 DIAGNOSIS — F05 Delirium due to known physiological condition: Secondary | ICD-10-CM | POA: Diagnosis not present

## 2018-02-09 DIAGNOSIS — Z833 Family history of diabetes mellitus: Secondary | ICD-10-CM

## 2018-02-09 DIAGNOSIS — Z86010 Personal history of colonic polyps: Secondary | ICD-10-CM

## 2018-02-09 DIAGNOSIS — E119 Type 2 diabetes mellitus without complications: Secondary | ICD-10-CM | POA: Diagnosis not present

## 2018-02-09 DIAGNOSIS — Z23 Encounter for immunization: Secondary | ICD-10-CM | POA: Diagnosis present

## 2018-02-09 DIAGNOSIS — M5126 Other intervertebral disc displacement, lumbar region: Secondary | ICD-10-CM | POA: Diagnosis present

## 2018-02-09 DIAGNOSIS — G4089 Other seizures: Secondary | ICD-10-CM | POA: Diagnosis not present

## 2018-02-09 DIAGNOSIS — Z96653 Presence of artificial knee joint, bilateral: Secondary | ICD-10-CM | POA: Diagnosis present

## 2018-02-09 DIAGNOSIS — I69320 Aphasia following cerebral infarction: Secondary | ICD-10-CM | POA: Diagnosis not present

## 2018-02-09 DIAGNOSIS — N179 Acute kidney failure, unspecified: Secondary | ICD-10-CM | POA: Diagnosis not present

## 2018-02-09 LAB — DIFFERENTIAL
Basophils Absolute: 0 10*3/uL (ref 0.0–0.1)
Basophils Relative: 0 %
EOS PCT: 2 %
Eosinophils Absolute: 0.1 10*3/uL (ref 0.0–0.7)
LYMPHS ABS: 2 10*3/uL (ref 0.7–4.0)
LYMPHS PCT: 27 %
Monocytes Absolute: 0.5 10*3/uL (ref 0.1–1.0)
Monocytes Relative: 6 %
Neutro Abs: 4.9 10*3/uL (ref 1.7–7.7)
Neutrophils Relative %: 65 %

## 2018-02-09 LAB — CBC
HCT: 40 % (ref 36.0–46.0)
Hemoglobin: 13.7 g/dL (ref 12.0–15.0)
MCH: 32.2 pg (ref 26.0–34.0)
MCHC: 34.3 g/dL (ref 30.0–36.0)
MCV: 93.9 fL (ref 78.0–100.0)
Platelets: 156 10*3/uL (ref 150–400)
RBC: 4.26 MIL/uL (ref 3.87–5.11)
RDW: 12.8 % (ref 11.5–15.5)
WBC: 7.6 10*3/uL (ref 4.0–10.5)

## 2018-02-09 LAB — I-STAT CHEM 8, ED
BUN: 15 mg/dL (ref 6–20)
Calcium, Ion: 1.21 mmol/L (ref 1.15–1.40)
Chloride: 106 mmol/L (ref 101–111)
Creatinine, Ser: 1 mg/dL (ref 0.44–1.00)
Glucose, Bld: 159 mg/dL — ABNORMAL HIGH (ref 65–99)
HCT: 39 % (ref 36.0–46.0)
HEMOGLOBIN: 13.3 g/dL (ref 12.0–15.0)
Potassium: 3.9 mmol/L (ref 3.5–5.1)
SODIUM: 143 mmol/L (ref 135–145)
TCO2: 24 mmol/L (ref 22–32)

## 2018-02-09 LAB — RAPID URINE DRUG SCREEN, HOSP PERFORMED
Amphetamines: NOT DETECTED
Barbiturates: NOT DETECTED
Benzodiazepines: NOT DETECTED
Cocaine: NOT DETECTED
OPIATES: NOT DETECTED
TETRAHYDROCANNABINOL: NOT DETECTED

## 2018-02-09 LAB — URINALYSIS, ROUTINE W REFLEX MICROSCOPIC
BILIRUBIN URINE: NEGATIVE
Glucose, UA: >=500 mg/dL — AB
KETONES UR: NEGATIVE mg/dL
NITRITE: POSITIVE — AB
PH: 5 (ref 5.0–8.0)
Protein, ur: NEGATIVE mg/dL
Specific Gravity, Urine: 1.009 (ref 1.005–1.030)

## 2018-02-09 LAB — TROPONIN I
Troponin I: <0.03 ng/mL (ref <0.03)
Troponin I: <0.03 ng/mL (ref <0.03)

## 2018-02-09 LAB — APTT: aPTT: 26 seconds (ref 24–36)

## 2018-02-09 LAB — COMPREHENSIVE METABOLIC PANEL
ALK PHOS: 82 U/L (ref 38–126)
ALT: 19 U/L (ref 14–54)
ANION GAP: 9 (ref 5–15)
AST: 25 U/L (ref 15–41)
Albumin: 3.3 g/dL — ABNORMAL LOW (ref 3.5–5.0)
BILIRUBIN TOTAL: 0.6 mg/dL (ref 0.3–1.2)
BUN: 14 mg/dL (ref 6–20)
CALCIUM: 8.9 mg/dL (ref 8.9–10.3)
CO2: 23 mmol/L (ref 22–32)
Chloride: 107 mmol/L (ref 101–111)
Creatinine, Ser: 1.09 mg/dL — ABNORMAL HIGH (ref 0.44–1.00)
GFR calc Af Amer: 54 mL/min — ABNORMAL LOW (ref >60)
GFR, EST NON AFRICAN AMERICAN: 47 mL/min — AB (ref >60)
Glucose, Bld: 163 mg/dL — ABNORMAL HIGH (ref 65–99)
Potassium: 3.8 mmol/L (ref 3.5–5.1)
Sodium: 139 mmol/L (ref 135–145)
TOTAL PROTEIN: 6.6 g/dL (ref 6.5–8.1)

## 2018-02-09 LAB — GLUCOSE, CAPILLARY: Glucose-Capillary: 83 mg/dL (ref 65–99)

## 2018-02-09 LAB — PROTIME-INR
INR: 1.04
PROTHROMBIN TIME: 13.5 s (ref 11.4–15.2)

## 2018-02-09 LAB — I-STAT TROPONIN, ED: TROPONIN I, POC: 0.01 ng/mL (ref 0.00–0.08)

## 2018-02-09 LAB — ETHANOL: Alcohol, Ethyl (B): <10 mg/dL (ref <10)

## 2018-02-09 MED ORDER — STROKE: EARLY STAGES OF RECOVERY BOOK: Freq: Once | Status: AC

## 2018-02-09 MED ORDER — IOPAMIDOL (ISOVUE-370) INJECTION 76%
INTRAVENOUS | Status: AC
  Administered 2018-02-09: 50 mL
  Filled 2018-02-09: qty 50

## 2018-02-09 MED ORDER — STROKE: EARLY STAGES OF RECOVERY BOOK
Freq: Once | Status: AC
  Administered 2018-02-09: 18:00:00
  Filled 2018-02-09: qty 1

## 2018-02-09 MED ORDER — LORAZEPAM 2 MG/ML IJ SOLN
1.0000 mg | Freq: Once | INTRAMUSCULAR | Status: AC
  Administered 2018-02-09: 1 mg via INTRAVENOUS
  Filled 2018-02-09: qty 1

## 2018-02-09 MED ORDER — SODIUM CHLORIDE 0.9 % IV SOLN
INTRAVENOUS | Status: DC
  Administered 2018-02-09: 18:00:00 via INTRAVENOUS

## 2018-02-09 MED ORDER — INSULIN ASPART 100 UNIT/ML ~~LOC~~ SOLN
0.0000 [IU] | Freq: Three times a day (TID) | SUBCUTANEOUS | Status: DC
  Administered 2018-02-10: 1 [IU] via SUBCUTANEOUS
  Administered 2018-02-11: 5 [IU] via SUBCUTANEOUS
  Administered 2018-02-11: 2 [IU] via SUBCUTANEOUS
  Administered 2018-02-12: 5 [IU] via SUBCUTANEOUS
  Administered 2018-02-12: 7 [IU] via SUBCUTANEOUS
  Administered 2018-02-12 – 2018-02-13 (×2): 2 [IU] via SUBCUTANEOUS
  Administered 2018-02-13: 7 [IU] via SUBCUTANEOUS
  Administered 2018-02-13: 9 [IU] via SUBCUTANEOUS

## 2018-02-09 MED ORDER — ACETAMINOPHEN 650 MG RE SUPP
650.0000 mg | RECTAL | Status: DC | PRN
  Administered 2018-02-10: 650 mg via RECTAL
  Filled 2018-02-09: qty 1

## 2018-02-09 MED ORDER — SODIUM CHLORIDE 0.9 % IV SOLN
1.0000 g | INTRAVENOUS | Status: AC
  Administered 2018-02-09 – 2018-02-11 (×3): 1 g via INTRAVENOUS
  Filled 2018-02-09 (×3): qty 10

## 2018-02-09 MED ORDER — LEVOTHYROXINE SODIUM 100 MCG PO TABS: 100.0000 ug | ORAL_TABLET | Freq: Every day | ORAL | Status: DC

## 2018-02-09 MED ORDER — LORAZEPAM 2 MG/ML IJ SOLN
0.5000 mg | Freq: Once | INTRAMUSCULAR | Status: AC
Start: 2018-02-09 — End: 2018-02-09
  Administered 2018-02-09: 0.5 mg via INTRAVENOUS

## 2018-02-09 MED ORDER — LORAZEPAM 2 MG/ML IJ SOLN
0.5000 mg | Freq: Once | INTRAMUSCULAR | Status: AC
  Administered 2018-02-09: 0.5 mg via INTRAVENOUS

## 2018-02-09 MED ORDER — LORAZEPAM 2 MG/ML IJ SOLN
INTRAMUSCULAR | Status: AC
  Administered 2018-02-09: 0.5 mg via INTRAVENOUS
  Filled 2018-02-09: qty 1

## 2018-02-09 MED ORDER — ACETAMINOPHEN 160 MG/5ML PO SOLN
650.0000 mg | ORAL | Status: DC | PRN
  Administered 2018-02-11: 650 mg
  Filled 2018-02-09: qty 20.3

## 2018-02-09 MED ORDER — SODIUM CHLORIDE 0.9 % IV SOLN: 1.0000 g | INTRAVENOUS | Status: DC

## 2018-02-09 MED ORDER — ACETAMINOPHEN 325 MG PO TABS: 650.0000 mg | ORAL_TABLET | ORAL | Status: DC | PRN

## 2018-02-09 NOTE — H&P (Addendum)
[facility] Admission History and Physical Service Pager: [phone]  Patient name: [name] Medical record number: [id number] Date of birth: [birth year] Age: 83 y.o. Gender: female  Primary Care Provider: Cyndi Bender, PA-C Consultants: Neurology Code Status: full (patient has living will which needs to be located, daughter states she is healthcare POA)  Chief Complaint: Aphasia and facial droop  Assessment and Plan: [name] is a 83 y.o. female presenting with AMS. PMH is significant for MI, past stroke, HTN, HLD, T2DM, hypothyroidism, degenerative joint disease, CAD w/ CABG  Aphasia and facial droop in the setting of CVA:  Patient last seen normal ~7pm on 3/21.  Woke up and went to kitchen for breakfast on 3/22 and started dropping items and speaking in non-sense phrasing.   Family noticed this as stroke symptoms from past CVA and called 911.  They said she was still able to move around but was not communicating clearly at all.   CT was neg for intracranial hemorrhage.  Patient continued to have significant aphasia/word salad on our exam and neuro recommended admission for stroke workup.   Full plan pending at this time but patient was stable on admission exam and symptoms had actually improved slightly from earlier in the morning when patient was completely mute per family (now with expressive aphasia).  Neuro exam limited by inability to comprehend/communicate commands but motor control appeared grossly intact with perhaps some defict in control/strength on R limbs. -admit to tele, Dr. Ardelia Mems - Neurology following, appreciate recommendations -EEG -MRI Brain/Head/Neck -Echo -anticoagulation per neuro recs pending --Order A1c, TSH  and lipid panel -SLP swallow eval -NPO  -IVF at 47m/hr for 10hrs --Ordered PT/OT  UTI:  Large leuks/positive nitrites on UA.  {Patient cannot reliably communicate symptoms).  Urinalysis also shows glucosuria with  glucose greater than 500 likely because of UTI in the setting of uncontrolled diabetes. -Order ceftriaxone 1 g IV --Follow-up on urine culture  T2DM: Per daughter on 852lantus and metformin.  Last glucose 150s, did get 80 lantus this AM 3/22 per daughter.  Patient has greater than 500 glucose in urine as well as urinary tract infection consistent with uncontrolled diabetes.  Patient will need careful glycemic control while inpatient. -Will start with SSI TID given NPO status --We will add long-acting as patient resume diet for optimal control -hold metformin  Chronic HFmrEF Chronic combined systolic and diastolic congestive heart failure. Last EF 40-45% in 2015.  No signs of volume overload on exam, will continue to monitor volume status.  CAD Contributing risk factor to CVA.  Does not tolerate statins however will likely benefit from aspirin given multiple risk factors and significant history with multiple CVA. -Follow Neurology recommendations  DJD: chronic, can ambulate independently at baseline -no acute intervention needed  HLD: chronic.  Patient does not tolerate statins -pending neuro recs  Hypothyroidism: chronic, on home 100 synthroid -continue 100 synthroid  CAD s/p CABG x3: Patient unable to communicate verbally.  istat troponin in Ed was neg -trend troponins  FEN/GI: npo will reassess once patient passes swallow screen Prophylaxis: SCDs pending neuro recs  Disposition: admit to tele for neuro workup  History of Present Illness:  Crystal GAINSis a 83y.o. female presenting with AMS and expressive aphasia.  Patient last seen normal ~7pm on 3/21.  Woke up and went to kitchen for breakfast on 3/22 and started dropping items and speaking in non-sense phrasing.   Family noticed this as stroke symptoms from  past CVA and called 911.  They said she was still able to move around but was not communicating clearly at all.  In ED patient forming words, but they are inappropriate.   They also noted that her right eye was closed at that time and that she might have had some right facial droop.  They state she is very regular with her medications.  No alcohol/drug use.  Family denies significant smoking hx despite  Review Of Systems: Per HPI with the following additions:   ROS  Patient Active Problem List   Diagnosis Date Noted  . Cardiomyopathy, ischemic- EF 45-50% at cath July 2015  06/27/2014  . Weakness 06/11/2014  . CAD S/P DES to Prox Cx & SVG-D1; PTCA of Anastomotic SVG-D1 lesion 06/05/2014  . NSTEMI (non-ST elevated myocardial infarction) ([place]) 06/04/2014  . Gout 03/13/2012  . History of breast cancer   . Chronic combined systolic and diastolic congestive heart failure, NYHA class 2 ([place]) 03/12/2012  . 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 Renal artery stenosis ([place]) 03/12/2012  . Essential hypertension 03/12/2012  . CVA (cerebral vascular accident) ([place]) 03/12/2012  . DM (diabetes mellitus), type 2, uncontrolled with complications ([place]) 44/01/4741  . Chronic kidney disease (CKD), stage II (mild) 03/12/2012  . Hypothyroidism- (TSH low, Synthroid held) 03/12/2012  . Hyperlipidemia with target LDL less than 70; and statin intolerant 03/12/2012  . Bradycardia 03/12/2012  . Syncope 03/12/2012  . History of DVT (deep vein thrombosis) 03/12/2012  . DJD (degenerative joint disease) of knee   . Hx of adenomatous colonic polyps     Past Medical History: Past Medical History:  Diagnosis Date  . Atherosclerosis of autologous vein coronary artery bypass graft with unstable angina pectoris    CATH: 80% prox SVG-D1, 95% anastomotic SVG-D1 lesion  . CAD (coronary artery disease), native coronary artery 2004   a) 5/'95: Complicated PCI-of pLAD wtih PTCA  (failed redo PTCA) --> CABG 06/2003 for ISR LAD & D1 (LIMA-LAD, SVG-dRCA, SVG-D1);; b) 4/'07: Patent grafts, Normal RHC pressures; c)  NSTEMI 9/'10: No culprit; 1/'12: recanalized LAD stent, patent grafts  . CAD S/P percutaneous coronary angioplasty 06/05/2014   a) 1/04: BMS x 2 LAD, PTCA D1 followed by unusuccessful redo PTCA D1 --> initially turned down CABG initially then CABG in 06/2003;; b) 05/2014: DES to Prox Cx & SVG-D1; PTCA of Anastomotic SVG-D1 lesion  . 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 Grade 1 diastolic dysfunction  . Chronic combined systolic and diastolic congestive heart failure, NYHA class 2 07/2009   Mixed. 2D-echocardiogram (08/2010) - 35% to 40%. Diffuse hypokinesis. Grade 2 diastolic dysfunction.  . CKD (chronic kidney disease) stage 2, GFR 60-89 ml/min    BL Cr 1.6  . Dementia   . DJD (degenerative joint disease) of knee    left  . DM (diabetes mellitus), type 2, uncontrolled with complications DX: [id number]   Stopped of Metformin in 04/2011 in setting of AKI (Cr 3), now on insulin.  . Essential hypertension 03/12/2012  . History of breast cancer    Primary oncologist Dr. Brigitte Pulse. // First diagnosed 2000, recurrence in 06/2010. S/P left mastectomy with radiation therapy (2000), following recurrence, started chemothearpy, completed course (06/2010.)  . History of CVA (cerebrovascular accident) 11/2009; 8& 04/2009  left frontal lobe stroke (11/2009), TIA (06/2009, 04/2009) with multiple prior TIAs manifested by aphasia. H/o CVA in 1988 involving right eye.  [name] History of DVT of lower extremity 07/2003   in setting of saphenous vein grafting for CABG  . History of renal artery stenosis 2010   Left 95% stenosis s/p left renal artery stenting (09/2009), right renal artery with 50% stenosis.  [name] Hx of adenomatous colonic polyps    remote history. Last colonoscopy (03/2004) - Normal colonoscopy to the cecum, rec 5 year  follow-up colonoscopy.  . Hyperlipidemia with target LDL less than 70   . Hypothyroidism   . S/P CABG x 3 06/2003   LIMA-LAD, SVG-D1, SVG-dRCA    Past Surgical History: Past Surgical History:  Procedure Laterality Date  . CARDIAC CATHETERIZATION  12/10/2002   Successful stenting of LAD with Cutting Balloon angioplasty of diagonal #1. There was a residual type B dissection in first diagonal branch.   . CARDIAC CATHETERIZATION  01/14/2003   Ostial 80% narrowing of the LAD - procedure was terminated after 5 unsuccessful attempts to pass the balloon into the stents and down to the lesion.  [name] CARDIAC CATHETERIZATION  07/22/2003   Recommended CABG revascularization  . CARDIAC CATHETERIZATION  03/01/2006   Low, normal LV systolic function, normal pulmonary artery pressures, and patent grafts.  [name] CARDIAC CATHETERIZATION  02/19/2008   Continue current medications and treatment plan.  [name] CARDIAC CATHETERIZATION  08/19/2009   Patent grafts with normal LV function  . CARDIAC CATHETERIZATION  11/25/2010   40% EF with anterior and distal inferior wall motion abnormalities. Patient grafts. Progression of diagonal and LAD disease.  [name] CARDIOVASCULAR STRESS TEST  09/30/2009   No scintigraphic evidence of inducible myocardial ischemia. No ECG changes. EKG negative for ischemia.  . CORONARY ARTERY BYPASS GRAFT  07/23/2003   x3. LIMA to LAD, SVG to diagonal, SVG to RCA.  [name] LEFT HEART CATHETERIZATION WITH CORONARY/GRAFT ANGIOGRAM N/A 06/05/2014   Procedure: LEFT HEART CATHETERIZATION WITH Beatrix Fetters;  Surgeon: Leonie Man, MD;  Location: [facility] CATH LAB;  Service: Cardiovascular;  Laterality: N/A;  . LUMBAR [place]    . RENAL ARTERY ANGIOPLASTY Left 10/05/2009   95% renal artery stenosis. Predilated with a 4x15 Aviator balloon, stented with a 5x15 Genesis and Aviator balloon stent with dilatatin at 10 atm. Resulting in reduction of 95% to 0% with excellent flow.  [name] RENAL DOPPLER  10/23/2011    Bilateral Renal Artery - No evidence of significant diameter reduction, tortuosity, or any other vascular abnormality/  . SIMPLE MASTECTOMY  2000   left, in setting of breast cancer  . TOTAL ABDOMINAL HYSTERECTOMY    . TOTAL KNEE ARTHROPLASTY  12/2004, 12/2005   BL, right (2006) by Dr. Berenice Primas. Left (2007) by Dr. Ronnie Derby.  . TRANSTHORACIC ECHOCARDIOGRAM  03/13/2012   EF 50-55%, mild focal basal hypertrophy of the septum. Abnormal LV relaxation (grade 1 diastolic dysfunction).    Social History: Social History   Tobacco Use  . Smoking status: Never Smoker  . Smokeless tobacco: Never Used  Substance Use Topics  . Alcohol use: No  . Drug use: No   Additional social history: lives at home with husband  Please also refer to relevant sections of EMR.  Family History: Family History  Problem Relation Age of Onset  . Heart disease Mother   . Diabetes Mother   . Heart disease Father   . Cancer Father        colon cancer  .  Heart disease Sister   . Heart disease Brother   . Cancer Brother        prostate cancer  . Heart disease Brother   . Cancer Sister        breast cancer     Allergies and Medications: Allergies  Allergen Reactions  . Brilinta [Ticagrelor] Shortness Of Breath  . Plavix [Clopidogrel Bisulfate] Other (See Comments)    NOT A RESPONDER- P2Y12 ASSAY WAS 224 (09/02/14)  . Statins Other (See Comments)    Lethargy, muscle pain   No current facility-administered medications on file prior to encounter.    Current Outpatient Medications on File Prior to Encounter  Medication Sig Dispense Refill  . aspirin EC 81 MG EC tablet Take 1 tablet (81 mg total) by mouth daily.    . furosemide (LASIX) 20 MG tablet Take 20 mg by mouth daily as needed for fluid or edema.    [name] ibuprofen (ADVIL,MOTRIN) 200 MG tablet Take 600 mg by mouth daily as needed for headache.    [name] LANTUS SOLOSTAR 100 UNIT/ML Solostar Pen Inject 80 Units into the skin every morning. Taper up to 110 units as  directed until fasting glucose <130  5  . metFORMIN (GLUCOPHAGE-XR) 500 MG 24 hr tablet Take 1,000 mg by mouth every evening.   5  . metoprolol succinate (TOPROL-XL) 25 MG 24 hr tablet Take 25 mg by mouth daily.  0  . nitroGLYCERIN (NITROSTAT) 0.4 MG SL tablet Place 1 tablet (0.4 mg total) under the tongue every 5 (five) minutes x 3 doses as needed for chest pain. 25 tablet 2  . SYNTHROID 100 MCG tablet Take 100 mcg by mouth daily.     . clopidogrel (PLAVIX) 75 MG tablet Take 1 tablet (75 mg total) by mouth daily. CALL FOR APPT FOR FURTHER REFILLS, THANKS! (Patient not taking: Reported on 02/09/2018) 15 tablet 0  . pantoprazole (PROTONIX) 40 MG tablet Take 1 tablet (40 mg total) by mouth daily. (Patient not taking: Reported on 08/04/2015) 30 tablet 11    Objective: BP 128/71 (BP Location: Right Arm)   Pulse (!) 55   Temp 98.1 F (36.7 C) (Oral)   Resp 16   SpO2 98%  Exam: General: NAD, not competently communicating Eyes: PERRLA, EOM grossly intact but was questionable for understanding commands to specific eye motion, no conjunctival pallor or injection ENTM: Moist mucous membranes, no pharyngeal erythema or exudate Cardiovascular: Regular rhythm but brady ~50, 3/6 murmur, no LE edema Respiratory: CTA BL, normal work of breathing Gastrointestinal: soft, nontender, nondistended MSK: moves left limbs grossly but not on command as communication is suspect, some decreased motion/strength on right arm and we could not get reliable motoin from R leg although family states neuro had been able to do so Derm: no rashes appreciated Neuro: CN II-XII as noted above in exam.  Very incomplete exam due to difficulty comprehending instruction.   Classic expressive aphasia, could say "OK" on occasion but mostly non-sense syllables. Psych: calm/pleasant affect   Labs and Imaging: CBC BMET  Recent Labs  Lab 02/09/18 1306 02/09/18 1323  WBC 7.6  --   HGB 13.7 13.3  HCT 40.0 39.0  PLT 156  --     Recent Labs  Lab 02/09/18 1306 02/09/18 1323  NA 139 143  K 3.8 3.9  CL 107 106  CO2 23  --   BUN 14 15  CREATININE 1.09* 1.00  GLUCOSE 163* 159*  CALCIUM 8.9  --  I have seen and evaluated the patient with Dr. Criss Rosales. I am in agreement with the note above in its revised form. My additions are in blue.  Marjie Skiff, MD Family Medicine, PGY-2   Sherene Sires, DO 02/09/2018, 5:01 PM PGY-1, [facility] pager: [phone], text pages welcome

## 2018-02-09 NOTE — Consult Note (Addendum)
Neurology Consultation  Reason for Consult: Stroke evaluation  Referring Physician: Dr. Ellender Hose CC: Speech difficulties  History is obtained from patient's family.   HPI: [name] is a 83 y.o. female with a past medical history of HTN, HLD, CAD, DM, CKD II, Hypothyroidism, RAS, CAD s/p CABG, ischemic cardiomyopathy, prior CVAs, and dementia. At baseline, patient is oriented to person and place and is able to perform all ADLs except for cooking. She has had two prior CVAs, most recently a thalamic stroke in 2012 and a prior remote left MCA infarct. Family reports she was in her usual state of health last night when she went to bed. When she woke up this morning, she was almost completely aphasic aside from moans and occasionally saying "ok". Daughter noted that patient was weak on the right side, was experiencing difficulty walking, and keeping her right eye open. She was brought in to the ED for further evaluation. Since arrival, weakness has improved some but she continues to have nonsensical speech.  In the ED, CT head was was negative for acute abnormality but demonstrated chronic microvascular ischemic changes and remote bilateral infarcts. CBC and CMP were unremarkable aside from CKD at baseline and hyperglycemia. UA collected and pending   LKW: Yesterday evening  tpa given?: no Premorbid modified Rankin scale (mRS):  0-Completely asymptomatic and back to baseline post-stroke 1-No significant post stroke disability and can perform usual duties with stroke symptoms 2-Slight disability-UNABLE to perform all activities but does not need assistance  3-Moderate disability-requires help but walks WITHOUT assistance 4-Needs assistance to walk and tend to bodily needs 5-Severe disability-bedridden, incontinent, needs constant attention 6- Death  ICH Score:  ROS: Unable to obtain due to altered mental status.   Past Medical History:  Diagnosis Date  . Atherosclerosis of autologous vein  coronary artery bypass graft with unstable angina pectoris    CATH: 80% prox SVG-D1, 95% anastomotic SVG-D1 lesion  . CAD (coronary artery disease), native coronary artery 2004   a) 5/'10: Complicated PCI-of pLAD wtih PTCA  (failed redo PTCA) --> CABG 06/2003 for ISR LAD & D1 (LIMA-LAD, SVG-dRCA, SVG-D1);; b) 4/'07: Patent grafts, Normal RHC pressures; c) NSTEMI 9/'10: No culprit; 1/'12: recanalized LAD stent, patent grafts  . CAD S/P percutaneous coronary angioplasty 06/05/2014   a) 1/04: BMS x 2 LAD, PTCA D1 followed by unusuccessful redo PTCA D1 --> initially turned down CABG initially then CABG in 06/2003;; b) 05/2014: DES to Prox Cx & SVG-D1; PTCA of Anastomotic SVG-D1 lesion  . 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 Grade 1 diastolic dysfunction  . Chronic combined systolic and diastolic congestive heart failure, NYHA class 2 07/2009   Mixed. 2D-echocardiogram (08/2010) - 35% to 40%. Diffuse hypokinesis. Grade 2 diastolic dysfunction.  . CKD (chronic kidney disease) stage 2, GFR 60-89 ml/min    BL Cr 1.6  . Dementia   . DJD (degenerative joint disease) of knee    left  . DM (diabetes mellitus), type 2, uncontrolled with complications DX: 2585   Stopped of Metformin in 04/2011 in setting of AKI (Cr 3), now on insulin.  . Essential hypertension 03/12/2012  . History of breast cancer    Primary oncologist Dr. Brigitte Pulse. // First diagnosed 2000, recurrence in 06/2010. S/P left mastectomy with radiation therapy (2000), following recurrence, started chemothearpy, completed course (06/2010.)  .  History of CVA (cerebrovascular accident) 11/2009; 8& 04/2009   left frontal lobe stroke (11/2009), TIA (06/2009, 04/2009) with multiple prior TIAs manifested by aphasia. H/o CVA in 1988 involving right eye.  [name] History of  DVT of lower extremity 07/2003   in setting of saphenous vein grafting for CABG  . History of renal artery stenosis 2010   Left 95% stenosis s/p left renal artery stenting (09/2009), right renal artery with 50% stenosis.  [name] Hx of adenomatous colonic polyps    remote history. Last colonoscopy (03/2004) - Normal colonoscopy to the cecum, rec 5 year follow-up colonoscopy.  . Hyperlipidemia with target LDL less than 70   . Hypothyroidism   . S/P CABG x 3 06/2003   LIMA-LAD, SVG-D1, SVG-dRCA    Family History  Problem Relation Age of Onset  . Heart disease Mother   . Diabetes Mother   . Heart disease Father   . Cancer Father        colon cancer  . Heart disease Sister   . Heart disease Brother   . Cancer Brother        prostate cancer  . Heart disease Brother   . Cancer Sister        breast cancer    Social History:   reports that she has never smoked. She has never used smokeless tobacco. She reports that she does not drink alcohol or use drugs.  Medications No current facility-administered medications for this encounter.   Current Outpatient Medications:  .  aspirin EC 81 MG EC tablet, Take 1 tablet (81 mg total) by mouth daily., Disp: , Rfl:  .  atorvastatin (LIPITOR) 80 MG tablet, Take 80 mg by mouth daily., Disp: , Rfl: 0 .  cephALEXin (KEFLEX) 500 MG capsule, Take 1 capsule (500 mg total) by mouth 4 (four) times daily., Disp: 28 capsule, Rfl: 0 .  clopidogrel (PLAVIX) 75 MG tablet, Take 1 tablet (75 mg total) by mouth daily. CALL FOR APPT FOR FURTHER REFILLS, THANKS!, Disp: 15 tablet, Rfl: 0 .  furosemide (LASIX) 20 MG tablet, Take 20 mg by mouth daily as needed for fluid or edema., Disp: , Rfl:  .  ibuprofen (ADVIL,MOTRIN) 200 MG tablet, Take 600 mg by mouth daily as needed for headache., Disp: , Rfl:  .  LANTUS SOLOSTAR 100 UNIT/ML Solostar Pen, Inject 90 Units into the skin every morning. Taper up to 110 units as directed until fasting glucose <130, Disp: , Rfl: 5 .   metFORMIN (GLUCOPHAGE-XR) 500 MG 24 hr tablet, Take 1,000 mg by mouth every evening. , Disp: , Rfl: 5 .  metoprolol tartrate (LOPRESSOR) 25 MG tablet, Take 25 mg by mouth daily. , Disp: , Rfl:  .  nitroGLYCERIN (NITROSTAT) 0.4 MG SL tablet, Place 1 tablet (0.4 mg total) under the tongue every 5 (five) minutes x 3 doses as needed for chest pain., Disp: 25 tablet, Rfl: 2 .  pantoprazole (PROTONIX) 40 MG tablet, Take 1 tablet (40 mg total) by mouth daily. (Patient not taking: Reported on 08/04/2015), Disp: 30 tablet, Rfl: 11 .  SYNTHROID 100 MCG tablet, Take 100 mcg by mouth daily. , Disp: , Rfl:    Exam: Current vital signs: BP (!) 167/85   Pulse (!) 52   Resp 13   SpO2 100%  Vital signs in last 24 hours: Pulse Rate:  [52] 52 (03/22 1300) Resp:  [13] 13 (03/22 1300) BP: (167)/(85) 167/85 (03/22 1300) SpO2:  [100 %] 100 % (03/22 1300)  GENERAL:  Awake, alert in NAD HEENT: - Normocephalic and atraumatic, dry mm, no LN++, no Thyromegally LUNGS - Clear to auscultation bilaterally with no wheezes CV - S1S2 RRR, no m/r/g, equal pulses bilaterally. ABDOMEN - Soft, nontender, nondistended with normoactive BS Ext: warm, well perfused, intact peripheral pulses, no edema NEURO:  Mental Status: Alert, following most commands, unable to assess orientation due to aphasia Language: speech is nonsensical.  Able to follow commands but exhibiting expressive aphasia. Unable to name objects, repetition is inconsistent.  Cranial Nerves: PERRL. EOMI, visual fields full, no facial asymmetry. Unable to assess facial sensation, hearing appears intact, tongue/uvula/soft palate midline, No evidence of tongue atrophy or fibrillations Motor: Moving all extremities spontaneously. Decreased right grip strength 3/5 compared to 5/5 on left.  Tone: is normal and bulk is normal Sensation- unable to assess Coordination: FTN intact bilaterally Gait- deferred  NIHSS: 7   Labs I have reviewed labs in epic and the  results pertinent to this consultation are:  CBC    Component Value Date/Time   WBC 10.1 08/04/2015 2020   RBC 4.14 08/04/2015 2020   HGB 13.1 08/04/2015 2020   HGB 10.9 (L) 01/25/2011 1256   HCT 38.6 08/04/2015 2020   HCT 32.0 (L) 01/25/2011 1256   PLT 166 08/04/2015 2020   PLT 206 01/25/2011 1256   MCV 93.2 08/04/2015 2020   MCV 90.5 01/25/2011 1256   MCH 31.6 08/04/2015 2020   MCHC 33.9 08/04/2015 2020   RDW 12.8 08/04/2015 2020   RDW 15.3 (H) 01/25/2011 1256   LYMPHSABS 1.2 08/04/2015 2020   LYMPHSABS 1.2 01/25/2011 1256   MONOABS 0.4 08/04/2015 2020   MONOABS 0.6 01/25/2011 1256   EOSABS 0.0 08/04/2015 2020   EOSABS 0.2 01/25/2011 1256   BASOSABS 0.0 08/04/2015 2020   BASOSABS 0.0 01/25/2011 1256    CMP     Component Value Date/Time   NA 137 08/04/2015 2020   K 4.1 08/04/2015 2020   CL 103 08/04/2015 2020   CO2 24 08/04/2015 2020   GLUCOSE 161 (H) 08/04/2015 2020   BUN 13 08/04/2015 2020   CREATININE 1.21 (H) 08/04/2015 2020   CALCIUM 8.6 (L) 08/04/2015 2020   PROT 6.3 (L) 08/04/2015 2020   ALBUMIN 3.3 (L) 08/04/2015 2020   AST 22 08/04/2015 2020   ALT 20 08/04/2015 2020   ALKPHOS 96 08/04/2015 2020   BILITOT 1.1 08/04/2015 2020   GFRNONAA 42 (L) 08/04/2015 2020   GFRAA 49 (L) 08/04/2015 2020    Lipid Panel     Component Value Date/Time   CHOL 249 (H) 05/14/2011 0235   TRIG 126 05/14/2011 0235   HDL 41 05/14/2011 0235   CHOLHDL 6.1 05/14/2011 0235   VLDL 25 05/14/2011 0235   LDLCALC 183 (H) 05/14/2011 0235   Imaging I have reviewed the images obtained:  CT-scan of the brain  IMPRESSION: No acute abnormality. Atrophy, chronic microvascular ischemic change remote bilateral frontal infarcts. Atherosclerosis. Chronic left sphenoid sinus disease.  MRI examination of the brain - Pending  Assessment: 83 yo F with multiple medical co-morbidities including prior CVAs without significant residual deficit, mild dementia, CAD s/p CABG, ischemic  cardiomyopathy, and renal artery stenosis presenting with aphasia and right sided weakness since waking this morning. These symptoms are similar in presentation to her prior stroke.   Impression:  Concern for acute left MCA infarct vs. TIA vs. Seizure activity vs. recrudescence of prior stroke deficits   Recommendations: -Admit to hospitalist -Telemetry monitoring -EEG -Allow for permissive hypertension  for the first 24-48h - only treat PRN if SBP >220 mmHg. Blood pressures can be gradually normalized to SBP<140 upon discharge. -MRI brain without contrast -MRA head without contrast, MRA neck with contrast -Echocardiogram -HgbA1c, fasting lipid panel -Frequent neuro checks -ASA 325 -No plavix (non responder), breathing issues with brilinta. If acute CVA confirmed on MRI will consider dipyridamole -Statin intolerant  -Risk factor modification -PT consult, OT consult, Speech consult -If Afib found on telemetry, will need anticoagulation. Decision pending imaging and stroke team rounding.  Please page stroke NP/PA/MD (listed on AMION)  from 8am-4 pm as this patient will be followed by the stroke team at this point.  Velna Ochs, M.D. - PGY2 02/09/2018, 1:22 PM    Attending addendum Patient seen and examined. Consulted for concern for stroke in a patient with known history of strokes. At baseline, patient is able to feed herself, requires some help with other ADLs. Has had a stroke in 2012 and one prior to that that involved the right side with weakness along with a vision speech problems. This morning, around 9 AM family noted that she had garbled speech-that made no sense. They brought her to the emergency room, outside of the TPA window. Patient was seen and evaluated in the emergency room and had no focal cranial nerve, sensory or motor deficits. Her deficits were purely aphasia.  Per family her symptoms are improving.  She was nearly mute in the morning and now she is talking  some with her speech making no sense. A code stroke was not activated as she was outside the window for IV TPA. Upon my evaluation, Patient was awake alert, complete word salad on speech. She was unable to follow any commands. She was able to mimic. Pupils equal round reactive to light, extraocular movements intact, visual fields full, face symmetric, auditory acuity intact, palate midline, tongue midline, shoulder shrug intact. Motor exam: Very mild right arm weakness 4+/5 at the shoulder elbow and wrist but 3/5 right hand grip strength, otherwise 5/5. Sensory exam: Intact to noxious edematous all over Coordination: Intact finger-nose-finger bilaterally  NIH stroke scale  1a Level of Conscious.: 0 1b LOC Questions: 2 1c LOC Commands: 2 2 Best Gaze: 0 3 Visual: 0 4 Facial Palsy: 0 5a Motor Arm - left: 0 5b Motor Arm - Right: 0 6a Motor Leg - Left: 0 6b Motor Leg - Right: 0 7 Limb Ataxia: 0 8 Sensory: 0 9 Best Language: 2 10 Dysarthria: 1 11 Extinct. and Inatten.: 0 TOTAL: 7   Noncontrast CT of the head was done and showed no acute changes.  Evidence of chronic left MCA infarct.   Assessment and plan Patient is a 83 year old woman past history of hypertension hyperlipidemia CAD diabetes hypothyroidism coronary artery disease, presented with garbled speech that started around 9 AM on 9:30 AM.  She was outside the window for IV TPA.  On examination, she has aphasia-receptive and expressive and right hand grip strength weakness with no other motor or sensory findings. Due to the low stroke scale and similar presentations on prior strokes and also possible history of recrudescence in the setting of infection, a code stroke was not activated.  Impression Acute ischemic stroke involving the left MCA territory-high on the differential Recrudescence of stroke symptoms Low on the differentials is also possible seizures  Recommendations-see above Agree with recommendations provided  above -which I have edited.   -- Amie Portland, MD [facility] Pager: [phone] If 7pm to 7am, please call on  call as listed on AMION.

## 2018-02-09 NOTE — ED Notes (Signed)
Report attempted x 1

## 2018-02-09 NOTE — ED Triage Notes (Signed)
Pt arrived via [facility] after family noticed pt was slurring her speech. Family last saw pt in her normal state 02/08/2018 @ 20:00. Pt has hx of CVA. Per EMS, pt was weak on right side. Grip Strength equal at time of triage. EMS v/s 158/76, hr 50, Sp02 96%ra, cbg 347. Pt is alert and oriented x4.

## 2018-02-09 NOTE — Progress Notes (Signed)
Attending Brief Admission Note  Patient seen and examined around 4:30. Briefly, 83 y.o. female with history of prior CVA, hypothyroidism, hyperlipidemia, type 2 diabetes, dementia, CAD s/p CABG, hypertension,  presenting with aphasia/word salad, most concerning for new stroke. Neurology following. Stroke workup initiated.   Will sign resident H&P when it is available.  Chrisandra Netters, MD [place]

## 2018-02-09 NOTE — Progress Notes (Signed)
Pt continued to be irritable and restless; MD notified and new order received for ativan prior to CT scan. Med administered and pt transported off unit to CT. Pt calm and resting comfortably in bed. Will report off to oncoming RN. Delia Heady RN

## 2018-02-09 NOTE — ED Provider Notes (Signed)
[facility] EMERGENCY DEPARTMENT Provider Note   CSN: [id number] Arrival date & time: 02/09/18  1129     History   Chief Complaint Chief Complaint  Patient presents with  . Weakness    HPI [name] is a 83 y.o. female.  HPI   83 year old female with extensive past medical history as below including history of strokes and dementia here with altered mental status.  On arrival, history limited due to altered mental status.  Per report from EMS, the patient was last seen normal around 7:00 last night.  She has a history of dementia but was at her baseline state of health.  She lives with her husband.  When family came to see her this morning, the patient was reportedly notably altered.  She was staring at them but mumbling only and incoherent.  She did not recognize her daughter.  Family also thinks that there may have been a mild droop on the right side of her face.  EMS was subsequently called.  With EMS, the patient has been speaking words, but they are nonsensical.  On my assessment, patient unable to provide history.  Level 5 caveat invoked as remainder of history, ROS, and physical exam limited due to patient's aphasia, dementia.     Past Medical History:  Diagnosis Date  . Atherosclerosis of autologous vein coronary artery bypass graft with unstable angina pectoris    CATH: 80% prox SVG-D1, 95% anastomotic SVG-D1 lesion  . CAD (coronary artery disease), native coronary artery 2004   a) 0/'94: Complicated PCI-of pLAD wtih PTCA  (failed redo PTCA) --> CABG 06/2003 for ISR LAD & D1 (LIMA-LAD, SVG-dRCA, SVG-D1);; b) 4/'07: Patent grafts, Normal RHC pressures; c) NSTEMI 9/'10: No culprit; 1/'12: recanalized LAD stent, patent grafts  . CAD S/P percutaneous coronary angioplasty 06/05/2014   a) 1/04: BMS x 2 LAD, PTCA D1 followed by unusuccessful redo PTCA D1 --> initially turned down CABG initially then CABG in 06/2003;; b) 05/2014: DES to Prox Cx & SVG-D1; PTCA of  Anastomotic SVG-D1 lesion  . 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 Grade 1 diastolic dysfunction  . Chronic combined systolic and diastolic congestive heart failure, NYHA class 2 07/2009   Mixed. 2D-echocardiogram (08/2010) - 35% to 40%. Diffuse hypokinesis. Grade 2 diastolic dysfunction.  . CKD (chronic kidney disease) stage 2, GFR 60-89 ml/min    BL Cr 1.6  . Dementia   . DJD (degenerative joint disease) of knee    left  . DM (diabetes mellitus), type 2, uncontrolled with complications DX: 7096   Stopped of Metformin in 04/2011 in setting of AKI (Cr 3), now on insulin.  . Essential hypertension 03/12/2012  . History of breast cancer    Primary oncologist Dr. Brigitte Pulse. // First diagnosed 2000, recurrence in 06/2010. S/P left mastectomy with radiation therapy (2000), following recurrence, started chemothearpy, completed course (06/2010.)  . History of CVA (cerebrovascular accident) 11/2009; 8& 04/2009   left frontal lobe stroke (11/2009), TIA (06/2009, 04/2009) with multiple prior TIAs manifested by aphasia. H/o CVA in 1988 involving right eye.  [name] History of DVT of lower extremity 07/2003   in setting of saphenous vein grafting for CABG  . History of renal artery stenosis 2010   Left 95% stenosis s/p left renal artery stenting (09/2009), right renal artery with 50%  stenosis.  [name] Hx of adenomatous colonic polyps    remote history. Last colonoscopy (03/2004) - Normal colonoscopy to the cecum, rec 5 year follow-up colonoscopy.  . Hyperlipidemia with target LDL less than 70   . Hypothyroidism   . S/P CABG x 3 06/2003   LIMA-LAD, SVG-D1, SVG-dRCA    Patient Active Problem List   Diagnosis Date Noted  . Cardiomyopathy, ischemic- EF 45-50% at cath July 2015  06/27/2014  . Weakness 06/11/2014  . CAD S/P  DES to Prox Cx & SVG-D1; PTCA of Anastomotic SVG-D1 lesion 06/05/2014  . NSTEMI (non-ST elevated myocardial infarction) ([place]) 06/04/2014  . Gout 03/13/2012  . History of breast cancer   . Chronic combined systolic and diastolic congestive heart failure, NYHA class 2 ([place]) 03/12/2012  . 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 Renal artery stenosis ([place]) 03/12/2012  . Essential hypertension 03/12/2012  . CVA (cerebral vascular accident) ([place]) 03/12/2012  . DM (diabetes mellitus), type 2, uncontrolled with complications ([place]) 48/11/6551  . Chronic kidney disease (CKD), stage II (mild) 03/12/2012  . Hypothyroidism- (TSH low, Synthroid held) 03/12/2012  . Hyperlipidemia with target LDL less than 70; and statin intolerant 03/12/2012  . Bradycardia 03/12/2012  . Syncope 03/12/2012  . History of DVT (deep vein thrombosis) 03/12/2012  . DJD (degenerative joint disease) of knee   . Hx of adenomatous colonic polyps     Past Surgical History:  Procedure Laterality Date  . CARDIAC CATHETERIZATION  12/10/2002   Successful stenting of LAD with Cutting Balloon angioplasty of diagonal #1. There was a residual type B dissection in first diagonal branch.   . CARDIAC CATHETERIZATION  01/14/2003   Ostial 80% narrowing of the LAD - procedure was terminated after 5 unsuccessful attempts to pass the balloon into the stents and down to the lesion.  [name] CARDIAC CATHETERIZATION  07/22/2003   Recommended CABG revascularization  . CARDIAC CATHETERIZATION  03/01/2006   Low, normal LV systolic function, normal pulmonary artery pressures, and patent grafts.  [name] CARDIAC CATHETERIZATION  02/19/2008   Continue current medications and treatment plan.  [name] CARDIAC CATHETERIZATION  08/19/2009   Patent grafts with normal LV function  . CARDIAC CATHETERIZATION  11/25/2010   40% EF with anterior and distal inferior wall motion abnormalities. Patient grafts.  Progression of diagonal and LAD disease.  [name] CARDIOVASCULAR STRESS TEST  09/30/2009   No scintigraphic evidence of inducible myocardial ischemia. No ECG changes. EKG negative for ischemia.  . CORONARY ARTERY BYPASS GRAFT  07/23/2003   x3. LIMA to LAD, SVG to diagonal, SVG to RCA.  [name] LEFT HEART CATHETERIZATION WITH CORONARY/GRAFT ANGIOGRAM N/A 06/05/2014   Procedure: LEFT HEART CATHETERIZATION WITH Beatrix Fetters;  Surgeon: Leonie Man, MD;  Location: [facility] CATH LAB;  Service: Cardiovascular;  Laterality: N/A;  . LUMBAR [place]    . RENAL ARTERY ANGIOPLASTY Left 10/05/2009   95% renal artery stenosis. Predilated with a 4x15 Aviator balloon, stented with a 5x15 Genesis and Aviator balloon stent with dilatatin at 10 atm. Resulting in reduction of 95% to 0% with excellent flow.  [name] RENAL DOPPLER  10/23/2011   Bilateral Renal Artery - No evidence of significant diameter reduction, tortuosity, or any other vascular abnormality/  . SIMPLE MASTECTOMY  2000   left, in setting of breast cancer  . TOTAL ABDOMINAL HYSTERECTOMY    . TOTAL KNEE ARTHROPLASTY  12/2004, 12/2005   BL, right (2006) by Dr. Berenice Primas. Left (2007)  by Dr. Ronnie Derby.  . TRANSTHORACIC ECHOCARDIOGRAM  03/13/2012   EF 50-55%, mild focal basal hypertrophy of the septum. Abnormal LV relaxation (grade 1 diastolic dysfunction).    OB History   None      Home Medications    Prior to Admission medications   Medication Sig Start Date End Date Taking? Authorizing Provider  aspirin EC 81 MG EC tablet Take 1 tablet (81 mg total) by mouth daily. 06/08/14  Yes Kilroy, Luke K, PA-C  furosemide (LASIX) 20 MG tablet Take 20 mg by mouth daily as needed for fluid or edema.   Yes [provider]  ibuprofen (ADVIL,MOTRIN) 200 MG tablet Take 600 mg by mouth daily as needed for headache.   Yes [provider]  LANTUS SOLOSTAR 100 UNIT/ML Solostar Pen Inject 80 Units into the skin every morning. Taper up to 110 units as directed  until fasting glucose <130 06/19/15  Yes [provider]  metFORMIN (GLUCOPHAGE-XR) 500 MG 24 hr tablet Take 1,000 mg by mouth every evening.  07/22/15  Yes [provider]  metoprolol succinate (TOPROL-XL) 25 MG 24 hr tablet Take 25 mg by mouth daily. 01/18/18  Yes [provider]  nitroGLYCERIN (NITROSTAT) 0.4 MG SL tablet Place 1 tablet (0.4 mg total) under the tongue every 5 (five) minutes x 3 doses as needed for chest pain. 06/08/14  Yes Kilroy, Luke K, PA-C  SYNTHROID 100 MCG tablet Take 100 mcg by mouth daily.  03/12/15  Yes [provider]  clopidogrel (PLAVIX) 75 MG tablet Take 1 tablet (75 mg total) by mouth daily. CALL FOR APPT FOR FURTHER REFILLS, THANKS! Patient not taking: Reported on 02/09/2018 05/15/17   Leonie Man, MD  pantoprazole (PROTONIX) 40 MG tablet Take 1 tablet (40 mg total) by mouth daily. Patient not taking: Reported on 08/04/2015 06/27/14   Erlene Quan, PA-C    Family History Family History  Problem Relation Age of Onset  . Heart disease Mother   . Diabetes Mother   . Heart disease Father   . Cancer Father        colon cancer  . Heart disease Sister   . Heart disease Brother   . Cancer Brother        prostate cancer  . Heart disease Brother   . Cancer Sister        breast cancer    Social History Social History   Tobacco Use  . Smoking status: Never Smoker  . Smokeless tobacco: Never Used  Substance Use Topics  . Alcohol use: No  . Drug use: No     Allergies   Brilinta [ticagrelor]; Plavix [clopidogrel bisulfate]; and Statins   Review of Systems Review of Systems  Unable to perform ROS: Mental status change     Physical Exam Updated Vital Signs There were no vitals taken for this visit.  Physical Exam  Constitutional: She appears well-developed and well-nourished. No distress.  Elderly  HENT:  Head: Normocephalic and atraumatic.  Eyes: Conjunctivae are normal.  Neck: Neck supple.    Cardiovascular: Normal rate, regular rhythm and normal heart sounds. Exam reveals no friction rub.  No murmur heard. Pulmonary/Chest: Effort normal and breath sounds normal. No respiratory distress. She has no wheezes. She has no rales.  Abdominal: She exhibits no distension.  Musculoskeletal: She exhibits no edema.  Skin: Skin is warm. Capillary refill takes less than 2 seconds.  Psychiatric: She has a normal mood and affect.  Nursing note and vitals reviewed.  Neurological Exam:  Mental Status: Alert. Expressive aphasia noted. Follows commands, though unable to assess comprehension 2/2 expressive difficulties.  Cranial Nerves: Visual fields grossly intact. EOMI and PERRLA. No nystagmus noted. Facial sensation intact at forehead, maxillary cheek, and chin/mandible bilaterally. No facial asymmetry or weakness. Hearing grossly normal. Uvula is midline, and palate elevates symmetrically. Normal SCM and trapezius strength. Tongue midline without fasciculations. Motor: Muscle strength 5/5 in proximal and distal UE and LE bilaterally. No pronator drift. Muscle tone normal. Reflexes: 2+ and symmetrical in all four extremities.  Sensation: Intact to light touch in upper and lower extremities distally bilaterally.  Gait:Deferred. Coordination: Normal FTN bilaterally.     ED Treatments / Results  Labs (all labs ordered are listed, but only abnormal results are displayed) Labs Reviewed  COMPREHENSIVE METABOLIC PANEL - Abnormal; Notable for the following components:      Result Value   Glucose, Bld 163 (*)    Creatinine, Ser 1.09 (*)    Albumin 3.3 (*)    GFR calc non Af Amer 47 (*)    GFR calc Af Amer 54 (*)    All other components within normal limits  I-STAT CHEM 8, ED - Abnormal; Notable for the following components:   Glucose, Bld 159 (*)    All other components within normal limits  ETHANOL  PROTIME-INR  APTT  CBC  DIFFERENTIAL  RAPID URINE DRUG SCREEN, HOSP PERFORMED   URINALYSIS, ROUTINE W REFLEX MICROSCOPIC  I-STAT TROPONIN, ED    EKG  EKG Interpretation  Date/Time:  Friday February 09 2018 14:08:17 EDT Ventricular Rate:  51 PR Interval:    QRS Duration: 123 QT Interval:  464 QTC Calculation: 428 R Axis:   -54 Text Interpretation:  Sinus rhythm Left bundle branch block No significant change since last tracing Confirmed by Duffy Bruce [phone]) on 02/09/2018 2:48:00 PM       Radiology Dg Chest 2 View  Result Date: 02/09/2018 CLINICAL DATA:  Altered mental status. EXAM: CHEST - 2 VIEW COMPARISON:  Chest x-ray dated June 11, 2014. FINDINGS: The heart size and mediastinal contours are within normal limits. Prior CABG. Normal pulmonary vascularity. Unchanged elevation of the left hemidiaphragm. No focal consolidation, pleural effusion, or pneumothorax. No acute osseous abnormality. IMPRESSION: No active cardiopulmonary disease. Electronically Signed   By: Titus Dubin M.D.   On: 02/09/2018 12:39   Ct Head Wo Contrast  Result Date: 02/09/2018 CLINICAL DATA:  Slurred speech beginning today. EXAM: CT HEAD WITHOUT CONTRAST TECHNIQUE: Contiguous axial images were obtained from the base of the skull through the vertex without intravenous contrast. COMPARISON:  Head CT scan 08/04/2015.  Brain MRI 0 05/13/2011. FINDINGS: Brain: No evidence of acute infarction, hemorrhage, hydrocephalus, extra-axial collection or mass lesion/mass effect. Chronic microvascular ischemic change and remote bilateral frontal infarcts are again seen. Vascular: Atherosclerosis noted. Skull: Intact. Sinuses/Orbits: Mucosal thickening and calcifications in the left sphenoid sinus are unchanged. No acute finding. Other: None. IMPRESSION: No acute abnormality. Atrophy, chronic microvascular ischemic change remote bilateral frontal infarcts. Atherosclerosis. Chronic left sphenoid sinus disease. Electronically Signed   By: Inge Rise M.D.   On: 02/09/2018 12:49     Procedures Procedures (including critical care time)  Medications Ordered in ED Medications - No data to display   Initial Impression / Assessment and Plan / ED Course  I have reviewed the triage vital signs and the nursing notes.  Pertinent labs & imaging results that were available during my care of the patient were reviewed by me  and considered in my medical decision making (see chart for details).     83 yo F here with expressive aphasia, AMS. Symptoms seem to be improving here, concerning for TIA versus small CVA. Must also consider UTI, metabolic encephalopathy bringing out old stroke deficits. CT head is negative for bleed/acute abnormality. CXR neg. CBC and electrolytes at baseline and unremarkable. . Outside of tPA window. Neuro consulted, will admit to medicine.  Final Clinical Impressions(s) / ED Diagnoses   Final diagnoses:  Expressive aphasia    ED Discharge Orders    None       Duffy Bruce, MD 02/09/18 [phone]

## 2018-02-09 NOTE — ED Notes (Signed)
Patient transported to X-ray and CT 

## 2018-02-09 NOTE — Progress Notes (Signed)
Pt admitted to the unit from ED; pt alert but confused; keeps getting oob; VSS; telemetry applied and verified with CCMD; NT called to second verify. Pt skin dry and intact with no opened wound or pressure ulcer noted. Pt passed screen per ED charting but when offered something to drink but she don't recognize to swallow and not following commands. Pt kept NPO and speech eval ordered. MD notified. Safety sitter at bedside; pt speech is clear but word salad (incomprehesible). Expressive and receptive aphasia noted. MAE x4; pt and family oriented to the unit and room; bed alarm on and call light within reach. Will continue to closely monitor pt. Delia Heady RN

## 2018-02-10 ENCOUNTER — Inpatient Hospital Stay (HOSPITAL_COMMUNITY): Payer: Medicare Other

## 2018-02-10 ENCOUNTER — Other Ambulatory Visit (HOSPITAL_COMMUNITY): Payer: Medicare Other

## 2018-02-10 DIAGNOSIS — I25118 Atherosclerotic heart disease of native coronary artery with other forms of angina pectoris: Secondary | ICD-10-CM

## 2018-02-10 DIAGNOSIS — E1159 Type 2 diabetes mellitus with other circulatory complications: Secondary | ICD-10-CM

## 2018-02-10 DIAGNOSIS — E785 Hyperlipidemia, unspecified: Secondary | ICD-10-CM

## 2018-02-10 DIAGNOSIS — I420 Dilated cardiomyopathy: Secondary | ICD-10-CM

## 2018-02-10 DIAGNOSIS — I5042 Chronic combined systolic (congestive) and diastolic (congestive) heart failure: Secondary | ICD-10-CM

## 2018-02-10 DIAGNOSIS — I10 Essential (primary) hypertension: Secondary | ICD-10-CM

## 2018-02-10 DIAGNOSIS — I639 Cerebral infarction, unspecified: Secondary | ICD-10-CM

## 2018-02-10 DIAGNOSIS — I255 Ischemic cardiomyopathy: Secondary | ICD-10-CM

## 2018-02-10 DIAGNOSIS — R4701 Aphasia: Secondary | ICD-10-CM

## 2018-02-10 DIAGNOSIS — I351 Nonrheumatic aortic (valve) insufficiency: Secondary | ICD-10-CM

## 2018-02-10 LAB — TROPONIN I

## 2018-02-10 LAB — GLUCOSE, CAPILLARY
Glucose-Capillary: 105 mg/dL — ABNORMAL HIGH (ref 65–99)
Glucose-Capillary: 113 mg/dL — ABNORMAL HIGH (ref 65–99)
Glucose-Capillary: 122 mg/dL — ABNORMAL HIGH (ref 65–99)
Glucose-Capillary: 65 mg/dL (ref 65–99)
Glucose-Capillary: 65 mg/dL (ref 65–99)

## 2018-02-10 LAB — LIPID PANEL
CHOLESTEROL: 193 mg/dL (ref 0–200)
HDL: 37 mg/dL — ABNORMAL LOW (ref >40)
LDL Cholesterol: 131 mg/dL — ABNORMAL HIGH (ref 0–99)
Total CHOL/HDL Ratio: 5.2 RATIO
Triglycerides: 125 mg/dL (ref <150)
VLDL: 25 mg/dL (ref 0–40)

## 2018-02-10 LAB — ECHOCARDIOGRAM COMPLETE

## 2018-02-10 LAB — HEMOGLOBIN A1C
HEMOGLOBIN A1C: 8.6 % — AB (ref 4.8–5.6)
Mean Plasma Glucose: 200.12 mg/dL

## 2018-02-10 LAB — TSH: TSH: 0.317 u[IU]/mL — ABNORMAL LOW (ref 0.350–4.500)

## 2018-02-10 MED ORDER — LISINOPRIL 5 MG PO TABS
5.0000 mg | ORAL_TABLET | Freq: Every day | ORAL | Status: DC
  Administered 2018-02-11 – 2018-02-13 (×3): 5 mg via ORAL
  Filled 2018-02-10 (×3): qty 1

## 2018-02-10 MED ORDER — LEVOTHYROXINE SODIUM 75 MCG PO TABS
75.0000 ug | ORAL_TABLET | Freq: Every day | ORAL | Status: DC
  Administered 2018-02-12 – 2018-02-13 (×2): 75 ug via ORAL
  Filled 2018-02-10 (×2): qty 1

## 2018-02-10 MED ORDER — ASPIRIN EC 325 MG PO TBEC
325.0000 mg | DELAYED_RELEASE_TABLET | Freq: Every day | ORAL | Status: DC
  Administered 2018-02-11 – 2018-02-13 (×3): 325 mg via ORAL
  Filled 2018-02-10 (×3): qty 1

## 2018-02-10 MED ORDER — ASPIRIN 300 MG RE SUPP
300.0000 mg | Freq: Every day | RECTAL | Status: DC
  Administered 2018-02-10: 300 mg via RECTAL
  Filled 2018-02-10: qty 1

## 2018-02-10 MED ORDER — LORAZEPAM 2 MG/ML IJ SOLN
0.5000 mg | Freq: Once | INTRAMUSCULAR | Status: AC | PRN
  Administered 2018-02-10: 0.5 mg via INTRAVENOUS
  Filled 2018-02-10: qty 1

## 2018-02-10 MED ORDER — DEXTROSE-NACL 5-0.45 % IV SOLN
INTRAVENOUS | Status: DC
  Administered 2018-02-10: 01:00:00 via INTRAVENOUS

## 2018-02-10 NOTE — Progress Notes (Signed)
INTERIM PROGRESS  Paged about family saying patient was in pain and wanted to know about her BP meds.  Went to evaluate patient and she was trying to get up out of bed while being redirected by family.  They explained she has a hx of herniated lumbar disc (not on problem list but found in 2006 imaging) and that whenever she is in the hospital it hurts her more and she usually needs pain meds.  We discussed kpads and other options available for pain control and a reluctance to risk sedating her as it could mask any other deterioration in her status.    Plan for pain after noting she has not received any of her PRN tylenol is to administer tylenol and kpad, nurse will page later if patient still appears to be in pain later and we will offer additional treatment  For BP patient's systolic has been 878M with one errent reading of 190.  Per neurology her permissive hypertension should last for another 4-6 days.  The concept of permission hypertension was explained to patient's family and they seemed to appreciate the plan.  Plan for BP is to continue permissive HTN  -Dr. Criss Rosales

## 2018-02-10 NOTE — Evaluation (Signed)
Physical Therapy Evaluation Patient Details Name: [name] MRN: [id number] DOB: 1935/06/23 Today's Date: 02/10/2018   History of Present Illness  Pt is an 83 y/o female admitted on 02/09/18 secondary to facial droop and aphasia. Pt found to have a L MCA infarct. PMH including but not limited to dementia, HTN, HLD, CAD, DM, CKD II, Hypothyroidism, RAS, CAD s/p CABG, ischemic cardiomyopathy and prior CVAs.    Clinical Impression  Pt presented supine in bed with nurse tech present in room finishing with pericare. Pt very lethargic throughout and unable to provide any information regarding PLOF or home environment. No caregivers or family members present during evaluation. Pt currently requires total A x2 for bed mobility and transfers. Pt would continue to benefit from skilled physical therapy services at this time while admitted and after d/c to address the below listed limitations in order to improve overall safety and independence with functional mobility.     Follow Up Recommendations SNF;Supervision/Assistance - 24 hour    Equipment Recommendations  None recommended by PT    Recommendations for Other Services       Precautions / Restrictions Precautions Precautions: Fall Restrictions Weight Bearing Restrictions: No      Mobility  Bed Mobility Overal bed mobility: Needs Assistance Bed Mobility: Rolling;Sidelying to Sit;Sit to Supine Rolling: Total assist;+2 for physical assistance Sidelying to sit: Total assist;+2 for physical assistance   Sit to supine: Total assist;+2 for physical assistance   General bed mobility comments: total A for all aspects; pt did not attempt to initiate any movement, very lethargic throughout  Transfers Overall transfer level: Needs assistance Equipment used: 2 person hand held assist Transfers: Sit to/from Stand Sit to Stand: Total assist;+2 physical assistance         General transfer comment: performed x2 from EOB; pt tolerated  partial WB'ing through bilateral LEs  Ambulation/Gait                Stairs            Wheelchair Mobility    Modified Rankin (Stroke Patients Only) Modified Rankin (Stroke Patients Only) Pre-Morbid Rankin Score: Moderate disability Modified Rankin: Severe disability     Balance Overall balance assessment: Needs assistance Sitting-balance support: Feet supported Sitting balance-Leahy Scale: Poor     Standing balance support: During functional activity;Bilateral upper extremity supported Standing balance-Leahy Scale: Zero                               Pertinent Vitals/Pain Pain Assessment: Faces Faces Pain Scale: Hurts even more Pain Location: general grimacing with oral care Pain Descriptors / Indicators: Grimacing Pain Intervention(s): Monitored during session    Home Living Family/patient expects to be discharged to:: Unsure                 Additional Comments: pt lethargic throughout with dementia at baseline and unable to provide any information; no family/caregivers present during session    Prior Function Level of Independence: Needs assistance         Comments: Unable to determine - pt lethargic throughout with dementia at baseline and unable to provide any information; no family/caregivers present during session     Hand Dominance        Extremity/Trunk Assessment   Upper Extremity Assessment Upper Extremity Assessment: Defer to OT evaluation    Lower Extremity Assessment Lower Extremity Assessment: Generalized weakness(no active purposeful movement noted; tolerated partial WBing)  Cervical / Trunk Assessment Cervical / Trunk Assessment: Kyphotic  Communication   Communication: Expressive difficulties;Receptive difficulties  Cognition Arousal/Alertness: Lethargic;Suspect due to medications Behavior During Therapy: Flat affect Overall Cognitive Status: History of cognitive impairments - at baseline                                  General Comments: pt with dementia at baseline, no family members present to determine her baseline      General Comments      Exercises     Assessment/Plan    PT Assessment Patient needs continued PT services  PT Problem List Decreased strength;Decreased activity tolerance;Decreased balance;Decreased mobility;Decreased coordination;Decreased cognition;Decreased knowledge of use of DME;Decreased safety awareness;Decreased knowledge of precautions       PT Treatment Interventions DME instruction;Gait training;Functional mobility training;Therapeutic activities;Therapeutic exercise;Balance training;Neuromuscular re-education;Cognitive remediation;Patient/family education    PT Goals (Current goals can be found in the Care Plan section)  Acute Rehab PT Goals Patient Stated Goal: unable to state PT Goal Formulation: Patient unable to participate in goal setting Time For Goal Achievement: 02/24/18 Potential to Achieve Goals: Fair    Frequency Min 3X/week   Barriers to discharge        Co-evaluation PT/OT/SLP Co-Evaluation/Treatment: Yes Reason for Co-Treatment: Complexity of the patient's impairments (multi-system involvement);Necessary to address cognition/behavior during functional activity;For patient/therapist safety;To address functional/ADL transfers PT goals addressed during session: Mobility/safety with mobility;Balance         AM-PAC PT "6 Clicks" Daily Activity  Outcome Measure Difficulty turning over in bed (including adjusting bedclothes, sheets and blankets)?: Unable Difficulty moving from lying on back to sitting on the side of the bed? : Unable Difficulty sitting down on and standing up from a chair with arms (e.g., wheelchair, bedside commode, etc,.)?: Unable Help needed moving to and from a bed to chair (including a wheelchair)?: Total Help needed walking in hospital room?: Total Help needed climbing 3-5 steps with a  railing? : Total 6 Click Score: 6    End of Session Equipment Utilized During Treatment: Gait belt Activity Tolerance: Patient limited by lethargy Patient left: in bed;with call bell/phone within reach;with bed alarm set;with nursing/sitter in room Nurse Communication: Mobility status PT Visit Diagnosis: Other abnormalities of gait and mobility (R26.89);Other symptoms and signs involving the nervous system (R29.898)    Time: 4540-9811 PT Time Calculation (min) (ACUTE ONLY): 19 min   Charges:   PT Evaluation $PT Eval Moderate Complexity: 1 Mod     PT G Codes:        Crest, PT, DPT [place] 02/10/2018, 10:22 AM

## 2018-02-10 NOTE — Progress Notes (Signed)
EEg complete - results pending.

## 2018-02-10 NOTE — Consult Note (Addendum)
CARDIOLOGY CONSULT NOTE   Patient ID: [name] MRN: [id number], DOB/AGE: 1935-02-11   Admit date: 02/09/2018 Date of Consult: 02/10/2018  Primary Physician: Cyndi Bender, PA-C Primary Cardiologist: Dr Ellyn Hack Referring physician: Leeanne Rio, MD  Reason for consult:  Worsening LVEF, CVA  Problem List  Past Medical History:  Diagnosis Date  . Atherosclerosis of autologous vein coronary artery bypass graft with unstable angina pectoris    CATH: 80% prox SVG-D1, 95% anastomotic SVG-D1 lesion  . CAD (coronary artery disease), native coronary artery 2004   a) 9/'39: Complicated PCI-of pLAD wtih PTCA  (failed redo PTCA) --> CABG 06/2003 for ISR LAD & D1 (LIMA-LAD, SVG-dRCA, SVG-D1);; b) 4/'07: Patent grafts, Normal RHC pressures; c) NSTEMI 9/'10: No culprit; 1/'12: recanalized LAD stent, patent grafts  . CAD S/P percutaneous coronary angioplasty 06/05/2014   a) 1/04: BMS x 2 LAD, PTCA D1 followed by unusuccessful redo PTCA D1 --> initially turned down CABG initially then CABG in 06/2003;; b) 05/2014: DES to Prox Cx & SVG-D1; PTCA of Anastomotic SVG-D1 lesion  . 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 Grade 1 diastolic dysfunction  . Chronic combined systolic and diastolic congestive heart failure, NYHA class 2 07/2009   Mixed. 2D-echocardiogram (08/2010) - 35% to 40%. Diffuse hypokinesis. Grade 2 diastolic dysfunction.  . CKD (chronic kidney disease) stage 2, GFR 60-89 ml/min    BL Cr 1.6  . Dementia   . DJD (degenerative joint disease) of knee    left  . DM (diabetes mellitus), type 2, uncontrolled with complications DX: 0300   Stopped of Metformin in 04/2011 in setting of AKI (Cr 3), now on insulin.  . Essential hypertension 03/12/2012  . History of breast cancer    Primary  oncologist Dr. Brigitte Pulse. // First diagnosed 2000, recurrence in 06/2010. S/P left mastectomy with radiation therapy (2000), following recurrence, started chemothearpy, completed course (06/2010.)  . History of CVA (cerebrovascular accident) 11/2009; 8& 04/2009   left frontal lobe stroke (11/2009), TIA (06/2009, 04/2009) with multiple prior TIAs manifested by aphasia. H/o CVA in 1988 involving right eye.  [name] History of DVT of lower extremity 07/2003   in setting of saphenous vein grafting for CABG  . History of renal artery stenosis 2010   Left 95% stenosis s/p left renal artery stenting (09/2009), right renal artery with 50% stenosis.  [name] Hx of adenomatous colonic polyps    remote history. Last colonoscopy (03/2004) - Normal colonoscopy to the cecum, rec 5 year follow-up colonoscopy.  . Hyperlipidemia with target LDL less than 70   . Hypothyroidism   . S/P CABG x 3 06/2003   LIMA-LAD, SVG-D1, SVG-dRCA    Past Surgical History:  Procedure Laterality Date  . CARDIAC CATHETERIZATION  12/10/2002   Successful stenting of LAD with Cutting Balloon angioplasty of diagonal #1. There was a residual type B dissection in first diagonal branch.   . CARDIAC CATHETERIZATION  01/14/2003   Ostial 80% narrowing of the LAD - procedure was terminated after 5 unsuccessful attempts to pass the balloon into the stents and down to the lesion.  [name] CARDIAC CATHETERIZATION  07/22/2003   Recommended CABG revascularization  . CARDIAC CATHETERIZATION  03/01/2006   Low, normal LV systolic function, normal pulmonary artery pressures, and patent grafts.  [name] CARDIAC CATHETERIZATION  02/19/2008   Continue current  medications and treatment plan.  [name] CARDIAC CATHETERIZATION  08/19/2009   Patent grafts with normal LV function  . CARDIAC CATHETERIZATION  11/25/2010   40% EF with anterior and distal inferior wall motion abnormalities. Patient grafts. Progression of diagonal and LAD disease.  [name] CARDIOVASCULAR STRESS TEST  09/30/2009   No  scintigraphic evidence of inducible myocardial ischemia. No ECG changes. EKG negative for ischemia.  . CORONARY ARTERY BYPASS GRAFT  07/23/2003   x3. LIMA to LAD, SVG to diagonal, SVG to RCA.  [name] LEFT HEART CATHETERIZATION WITH CORONARY/GRAFT ANGIOGRAM N/A 06/05/2014   Procedure: LEFT HEART CATHETERIZATION WITH Beatrix Fetters;  Surgeon: Leonie Man, MD;  Location: [facility] CATH LAB;  Service: Cardiovascular;  Laterality: N/A;  . LUMBAR [place]    . RENAL ARTERY ANGIOPLASTY Left 10/05/2009   95% renal artery stenosis. Predilated with a 4x15 Aviator balloon, stented with a 5x15 Genesis and Aviator balloon stent with dilatatin at 10 atm. Resulting in reduction of 95% to 0% with excellent flow.  [name] RENAL DOPPLER  10/23/2011   Bilateral Renal Artery - No evidence of significant diameter reduction, tortuosity, or any other vascular abnormality/  . SIMPLE MASTECTOMY  2000   left, in setting of breast cancer  . TOTAL ABDOMINAL HYSTERECTOMY    . TOTAL KNEE ARTHROPLASTY  12/2004, 12/2005   BL, right (2006) by Dr. Berenice Primas. Left (2007) by Dr. Ronnie Derby.  . TRANSTHORACIC ECHOCARDIOGRAM  03/13/2012   EF 50-55%, mild focal basal hypertrophy of the septum. Abnormal LV relaxation (grade 1 diastolic dysfunction).     Allergies  Allergies  Allergen Reactions  . Brilinta [Ticagrelor] Shortness Of Breath  . Plavix [Clopidogrel Bisulfate] Other (See Comments)    NOT A RESPONDER- P2Y12 ASSAY WAS 224 (09/02/14)  . Statins Other (See Comments)    Lethargy, muscle pain    HPI   83 y.o. female with a PMH CAD, PCIs in 2004, CABG 06/2003 for ISR LAD & D1 (LIMA-LAD, SVG-dRCA, SVG-D1); s/p NSTEMI 9/'10: No culprit; 1/'12: recanalized LAD stent, patent grafts, S/p NSTEMI in 05/2014 with DES to Prox Cx & SVG-D1; PTCA of Anastomotic SVG-D1 lesion.  She has known cardiomyopathy (combined ischemic and sec to chemotherapy), the last echo in 08/2014 showed LVEF 40% to 45% with mid to distal anterior, anteroseptal and  apical hypokinesis, suggestive of LAD territory ischemia/infarct, chronic combined CHF, she is followed by Dr Ellyn Hack, last seen on 03/13/2015.  She was admitted on 02/09/2018 with AMS, CT was neg forintracranial hemorrhage.Patient continued to have significant aphasia and facial droop and neuro recommended admission forstrokeworkup. Intracranial CTA showed no large vessel occlusion but diffuse atherosclerosis and stenoses of the the intracranial vasculature.   Echo showed new LVEf of 25% from previous 40-45%.  The family states that patient has been suffering from dementia in the last 4 years, she has missed several doctors appointment, even missed medications or forgot to eat food. She still recognizes her family members. She has not been able to communicate with her family since stroke yesterday but attempted to make a few steps this morning. Currently sleeping. MRI of her brainscheduled.   Inpatient Medications  . aspirin EC  325 mg Oral Daily   Or  . aspirin  300 mg Rectal Daily  . insulin aspart  0-9 Units Subcutaneous TID WC  . [START ON 02/11/2018] levothyroxine  75 mcg Oral QAC breakfast    Family History Family History  Problem Relation Age of Onset  . Heart disease Mother   . Diabetes  Mother   . Heart disease Father   . Cancer Father        colon cancer  . Heart disease Sister   . Heart disease Brother   . Cancer Brother        prostate cancer  . Heart disease Brother   . Cancer Sister        breast cancer     Social History Social History   Socioeconomic History  . Marital status: Married    Spouse name: Not on file  . Number of children: 3  . Years of education: 10 grade  . Highest education level: Not on file  Occupational History  . Occupation: Retired    Comment: previously worked as a Agricultural engineer, intermittently worked at [employer]  . Financial resource strain: Not on file  . Food insecurity:    Worry: Not on file    Inability: Not on  file  . Transportation needs:    Medical: Not on file    Non-medical: Not on file  Tobacco Use  . Smoking status: Never Smoker  . Smokeless tobacco: Never Used  Substance and Sexual Activity  . Alcohol use: No  . Drug use: No  . Sexual activity: Never  Lifestyle  . Physical activity:    Days per week: Not on file    Minutes per session: Not on file  . Stress: Not on file  Relationships  . Social connections:    Talks on phone: Not on file    Gets together: Not on file    Attends religious service: Not on file    Active member of club or organization: Not on file    Attends meetings of clubs or organizations: Not on file    Relationship status: Not on file  . Intimate partner violence:    Fear of current or ex partner: Not on file    Emotionally abused: Not on file    Physically abused: Not on file    Forced sexual activity: Not on file  Other Topics Concern  . Not on file  Social History Narrative   Patient lives at home with her husband.    She has 3 children, 3 grandchildren, and 1 great-grand child.   Insurance: Medicare.      Review of Systems  General:  No chills, fever, night sweats or weight changes.  Cardiovascular:  No chest pain, dyspnea on exertion, edema, orthopnea, palpitations, paroxysmal nocturnal dyspnea. Dermatological: No rash, lesions/masses Respiratory: No cough, dyspnea Urologic: No hematuria, dysuria Abdominal:   No nausea, vomiting, diarrhea, bright red blood per rectum, melena, or hematemesis Neurologic:  No visual changes, wkns, changes in mental status. All other systems reviewed and are otherwise negative except as noted above.  Physical Exam  Blood pressure (!) 157/57, pulse 64, temperature 97.6 F (36.4 C), temperature source Axillary, resp. rate 18, SpO2 98 %.  General: Pleasant, NAD Psych: Normal affect. Neuro: Alert and oriented X 3. Moves all extremities spontaneously. HEENT: Normal  Neck: Supple without bruits or JVD. Lungs:   Resp regular and unlabored, CTA. Heart: RRR no s3, s4, or murmurs. Abdomen: Soft, non-tender, non-distended, BS + x 4.  Extremities: No clubbing, cyanosis or edema. DP/PT/Radials 2+ and equal bilaterally.  Labs  Recent Labs    02/09/18 1648 02/09/18 2213 02/10/18 0428  TROPONINI <0.03 <0.03 <0.03   Lab Results  Component Value Date   WBC 7.6 02/09/2018   HGB 13.3 02/09/2018   HCT 39.0 02/09/2018  MCV 93.9 02/09/2018   PLT 156 02/09/2018    Recent Labs  Lab 02/09/18 1306 02/09/18 1323  NA 139 143  K 3.8 3.9  CL 107 106  CO2 23  --   BUN 14 15  CREATININE 1.09* 1.00  CALCIUM 8.9  --   PROT 6.6  --   BILITOT 0.6  --   ALKPHOS 82  --   ALT 19  --   AST 25  --   GLUCOSE 163* 159*   Lab Results  Component Value Date   CHOL 193 02/10/2018   HDL 37 (L) 02/10/2018   LDLCALC 131 (H) 02/10/2018   TRIG 125 02/10/2018   Lab Results  Component Value Date   DDIMER (H) 02/15/2008    0.73        AT THE INHOUSE ESTABLISHED CUTOFF VALUE OF 0.48 ug/mL FEU, THIS ASSAY HAS BEEN DOCUMENTED IN THE LITERATURE TO HAVE   Invalid input(s): POCBNP  Radiology/Studies  Dg Chest 2 View  Result Date: 02/09/2018 IMPRESSION: No active cardiopulmonary disease.   Ct Angio Neck W Or Wo Contrast  Result Date: 02/10/2018 CLINICAL DATA:  Initial evaluation for acute stroke  IMPRESSION: 1. Negative CTA for large vessel occlusion. 2. Atheromatous stenoses of approximately 60% about the carotid bifurcations/proximal ICAs bilaterally. 3. Attenuation of the distal left MCA branches, most likely related to chronic left MCA territory infarct. 4. Moderate to severe left V1 and V2 stenoses as above. 5. Advanced intracranial atherosclerotic disease throughout the remaining intracranial circulation, most notable within the carotid siphons, right worse than left. Electronically Signed   By: Jeannine Boga M.D.   On: 02/10/2018 02:50   Echocardiogram 02/10/2018  - Left ventricle: Septal and  apical akinesis inferior wall   hypokinesis. The cavity size was severely dilated. Wall thickness   was normal. The estimated ejection fraction was 25%. Doppler   parameters are consistent with both elevated ventricular   end-diastolic filling pressure and elevated left atrial filling   pressure. - Aortic valve: Given morphology and decreased EF AS likely more in   moderae range despite low gradient. There was mild stenosis.   There was mild regurgitation. - Mitral valve: Calcified annulus. Mildly thickened leaflets . - Atrial septum: No defect or patent foramen ovale was identified.  ECG: SR, LBBB, unchanged from 2016     ASSESSMENT AND PLAN  1. Acute stroke - management per neuro - ECG unchanged, no known a-fib - the patient has advanced dementia, I'm not sure that any aggressive management such as TEE to look for intracardiac thrombus is indicated, or implantation of loop recorder.  2. New decrease in LVEF from 40-45% to 25% - start lisinopril 5 mg po daily - hold BB as she is bradycardic  3. CAD, extensive PCI and CABG as above - ECG unchanged, no chest pain, negative troponins - continue aspirin - Statin intolerant, consider Zetia - given advanced dementia she is not a candidate for cardiac catheterization.  4. Chronic combined systolic and diastolic CHF - no signs of fluid overload  5. Hypertension  - start lisinopril, uptitrate as tolerated  Signed, Ena Dawley, MD, [facility] 02/10/2018, 2:18 PM

## 2018-02-10 NOTE — Procedures (Signed)
  ELECTROENCEPHALOGRAM REPORT  Date of Study: 02/10/18  Patient's Name: [name] MRN: [id number] Date of Birth: [birth year]  Referring Provider: Velna Ochs, MD  Clinical History: [name] is a 83 y.o. 83 y.o. female with history of coronary artery disease, hypothyroidism, hyperlipidemia, renal artery stenosis, previous DVT, history of breast cancer, diabetes mellitus, dementia, previous strokes, chronic kidney disease, and cardiomyopathy presenting with aphasia and right-sided weakness. She did not receive IV t-PA due to late presentation. CT Head negative for ICH.   Medications: Scheduled Meds: . aspirin EC  325 mg Oral Daily   Or  . aspirin  300 mg Rectal Daily  . insulin aspart  0-9 Units Subcutaneous TID WC  . [START ON 02/11/2018] levothyroxine  75 mcg Oral QAC breakfast  . lisinopril  5 mg Oral Daily   Continuous Infusions: . cefTRIAXone (ROCEPHIN)  IV Stopped (02/09/18 1837)  . dextrose 5 % and 0.45% NaCl Stopped (02/10/18 0100)   PRN Meds:.acetaminophen **OR** acetaminophen (TYLENOL) oral liquid 160 mg/5 mL **OR** acetaminophen, LORazepam            Technical Summary: This is a standard 16 channel EEG recording performed according to the international 10-20 electrode system.  AP bipolar, transverse bipolar, and referential montages were obtained, and digitally reformatted as necessary.  Duration of tracing: 22:13  Description: In the awake state there is a 8 Hz rhythm seen from the posterior head regions in a symmetric fashion.  Intermittent generalized, frontally predominant 2-3 Hz slowing is seen during the tracing consistent with FIRDA (Frontal Intermittent Rhythmic Delta).  Drowsiness is noted by vertex slowing, however no definite stage II sleep was identified.   Neither hyperventilation or photic stimulation was performed.  EKG was monitored and noted to be sinus bradycaria with an average heart rate of 48 bpm.  No epileptiform changes were  seen.  Impression: This is an abnormal EEG due to intermittent frontal slowing (FIRDA).   FIRDA is a non-specific finding that can be seen with toxic, metabolic, diffuse, or multifocal structural processes.  No definite epileptiform changes were noted.   A single EEG without epileptiform changes does not exclude the diagnosis of epilepsy. Clinical correlation advised.   Carvel Getting, M.D. Neurology Cell [phone]

## 2018-02-10 NOTE — Progress Notes (Signed)
Pt attempting to turn onto side inside scanner. Pt also grabbing and pull face plate of coil. Unable to continue exam due to pt behavior.

## 2018-02-10 NOTE — Progress Notes (Signed)
Pt remains NPO CBG was 65 but pt was asymptomatic; MD notified and new order received for D5 fluids and CBG changed to Q 4HRS. Reported off to oncoming RN.

## 2018-02-10 NOTE — Progress Notes (Signed)
  Echocardiogram 2D Echocardiogram has been performed.  [name] M 02/10/2018, 10:12 AM

## 2018-02-10 NOTE — Progress Notes (Signed)
Family Medicine Teaching Service Daily Progress Note Intern Pager: [phone]  Patient name: [name] Medical record number: [id number] Date of birth: 20-Jun-1935 Age: 83 y.o. Gender: female  Primary Care Provider: Cyndi Bender, PA-C Consultants: neuro Code Status: full (advanced directive names HPOAs and declines life sustaining measures if in permanent coma or vegetative state)  Pt Overview and Major Events to Date:  [name] is a 83 y.o. female presenting with AMS. PMH is significant for MI, past stroke, HTN, HLD, T2DM, hypothyroidism, degenerative joint disease, CAD w/ CABG  Assessment and Plan: [name] is a 83 y.o. female presenting with AMS. PMH is significant for MI, past stroke, HTN, HLD, T2DM, hypothyroidism, degenerative joint disease, CAD w/ CABG  Aphasia and facial droop in the setting of CVA:  Outside TPA time window on presentation.  CT was neg for intracranial hemorrhage.  Patient continued to have significant aphasia/word salad on our exam and neuro recommended admission for stroke workup. Patient not staying still enough for MRI, so ativan given for CTA which showed chronic changes/ischemia only with no acute findings. A1C 8.6, TSH 0.317 (low), lipid with normal total, low HDL and his LDL. - Neurology following, appreciate recommendations -EEG ordered -Echo ordered -aspiring 325 -anticoagulation per neuro recs pending (not candidate for plavix/brilinta) -SLP swallow eval (failed bedside with nurse) -NPO  -IVF D5 1/2NS at 37ml/hr for 10hrs --Ordered PT/OT eval  UTI:  Large leuks/positive nitrites on UA.  {Patient cannot reliably communicate symptoms).  Urinalysis also shows glucosuria with glucose greater than 500 likely because of UTI in the setting of uncontrolled diabetes. -Order ceftriaxone 1 g IV --Follow-up on urine culture  T2DM: Per daughter on home 80 lantus and metformin.  Last glucose 150s, did get 80 lantus this AM 3/22 per daughter  which could explain low cbg overnight.  Patient has greater than 500 glucose in urine as well as urinary tract infection consistent with uncontrolled diabetes.  Patient will need careful glycemic control while inpatient, now on D5 1/2NS. -Will start with SSI TID given NPO status --We will add long-acting as patient resumes diet for optimal control -hold metformin  New EF of 25%- HFpEF down from 45-50% Chronic combined systolic and diastolic congestive heart failure. Last prior EF 40-45% in 2015.  No signs of volume overload on exam, will continue to monitor volume status. -cardiology consulted -stopped BB due to bradycardia -started lisinpril -considering TEE  CAD Contributing risk factor to CVA.  Does not tolerate statins however will likely benefit from aspirin given multiple risk factors and significant history with multiple CVA. -Follow Neurology recommendations  DJD: chronic, can ambulate independently at baseline -no acute intervention needed  HLD: chronic.  Patient does not tolerate statins -neuro recs  Hypothyroidism: chronic, on home 100 synthroid.  TSH slightly low. -reduce to 75 synthroid  CAD s/p CABG x3: Patient unable to communicate verbally.  istat troponin in Ed was neg.  Trop negx3 -will monitor clinically  FEN/GI: npo will reassess if patient passes swallow screen (now on D5 1/2NS) Prophylaxis: SCDs pending neuro recs  Will try to reach husband as HPOA about snf/code.  Jenella Craigie [phone]  Disposition: admit to tele for neuro workup  Subjective:  Patient was too drowsy for reliable neuro exam but was spontaneously moving all limbs  Objective: Temp:  [97.5 F (36.4 C)-98.6 F (37 C)] 98.6 F (37 C) (03/23 0802) Pulse Rate:  [52-70] 55 (03/23 0802) Resp:  [13-18] 18 (03/23 0802) BP: (128-190)/(69-122) 157/69 (03/23  0802) SpO2:  [95 %-100 %] 100 % (03/23 0802) Physical Exam: General: stbale vitals but very drowsy after ativan Eyes: not  opening eyes ENTM: Moist mucous membranes, no pharyngeal erythema or exudate Cardiovascular: Regular rhythm but brady ~50, 3/6 murmur, no LE edema Respiratory: CTA BL, normal work of breathing Gastrointestinal: soft, nontender, nondistended MSK: moves all limbs spontaneously as she started to wake up  Derm: no rashes appreciated Neuro: moving all limbs but not speaking on exam as drowsy Psych: unable to assess   Laboratory: Recent Labs  Lab 02/09/18 1306 02/09/18 1323  WBC 7.6  --   HGB 13.7 13.3  HCT 40.0 39.0  PLT 156  --    Recent Labs  Lab 02/09/18 1306 02/09/18 1323  NA 139 143  K 3.8 3.9  CL 107 106  CO2 23  --   BUN 14 15  CREATININE 1.09* 1.00  CALCIUM 8.9  --   PROT 6.6  --   BILITOT 0.6  --   ALKPHOS 82  --   ALT 19  --   AST 25  --   GLUCOSE 163* 159*      Imaging/Diagnostic Tests: Ct Angio Head W Or Wo Contrast  Result Date: 02/10/2018 CLINICAL DATA:  Initial evaluation for acute stroke, garbled speech. EXAM: CT ANGIOGRAPHY HEAD AND NECK TECHNIQUE: Multidetector CT imaging of the head and neck was performed using the standard protocol during bolus administration of intravenous contrast. Multiplanar CT image reconstructions and MIPs were obtained to evaluate the vascular anatomy. Carotid stenosis measurements (when applicable) are obtained utilizing NASCET criteria, using the distal internal carotid diameter as the denominator. CONTRAST:  59mL ISOVUE-370 IOPAMIDOL (ISOVUE-370) INJECTION 76% COMPARISON:  Prior CT from earlier the same day. FINDINGS: CTA NECK FINDINGS Aortic arch: Visualized aortic arch of normal caliber with normal 3 vessel morphology. Moderate atheromatous plaque throughout the visualized arch. No high-grade stenosis at the origin of the great vessels. Short-segment approximate 50% stenosis at the proximal left subclavian artery (series 10, image 273). Visualized subclavian arteries otherwise patent. Note made of an additional short-segment  moderate stenosis at the left axillary artery (series 10, image 290). Right carotid system: Right common carotid artery widely patent from its origin to the bifurcation without stenosis. Atheromatous plaque about the proximal left ICA right ICA with associated stenosis of up to approximately 60% by NASCET criteria. Exact delineation somewhat difficult given motion artifact through this region. Right ICA widely patent distally to the skull base without stenosis, dissection or occlusion. Left carotid system: Left common carotid artery patent from its origin to the bifurcation without significant stenosis. Bulky calcified plaque about the left bifurcation with associated stenosis of up to approximately 60% by NASCET criteria. Again, evaluation somewhat limited by motion artifact. Left ICA patent distally to the skull base without stenosis, dissection, or occlusion. Vertebral arteries: Both of the vertebral arteries arise from the subclavian arteries. Focal plaque at the origin of the vertebral arteries bilaterally with secondary moderate narrowing on the left and more mild narrowing on the right. Right vertebral artery demonstrates scattered atheromatous irregularity but is patent within the neck without high-grade stenosis. There is focal severe proximal left V2 stenosis at the level of C6-7 (series 10, image 236). Multifocal irregularity with additional severe stenosis just distally at the level of C5 (series 10, image 222). This is likely due to extrinsic compression from uncovertebral disease. Left vertebral artery otherwise patent to the skull base. Skeleton: No acute osseus abnormality. No worrisome lytic or blastic  osseous lesions identified. Moderate degenerate spondylolysis with facet arthrosis noted within cervical spine. Other neck: No acute soft tissue abnormality within the neck. No adenopathy. Salivary glands normal. Thyroid within normal limits. Upper chest: Visualized upper chest demonstrates no acute  abnormality. Scattered atelectatic changes seen throughout the lungs. Advanced 3 vessel coronary artery calcifications noted. Median sternotomy wires noted. Review of the MIP images confirms the above findings CTA HEAD FINDINGS Anterior circulation: Pre cavernous petrous segments widely patent bilaterally. Smooth atheromatous plaque throughout the cavernous/supraclinoid ICAs with moderate diffuse narrowing, most pronounced at the supraclinoid right ICA. ICA termini widely patent. A1 segments patent bilaterally. Normal anterior communicating artery. Atheromatous irregularity within the anterior cerebral arteries without high-grade stenosis. M1 segments patent without high-grade stenosis. No proximal M2 occlusion. Distal left MCA branches are attenuated as compared to the right, likely related to chronic left MCA territory infarct. Extensive small vessel atheromatous irregularity noted. Posterior circulation: Vertebral arteries demonstrate multifocal atheromatous irregularity without high-grade flow-limiting stenosis. Right vertebral artery slightly dominant. Partially visualized posterior inferior cerebral arteries patent bilaterally. Atheromatous irregularity throughout the basilar artery without high-grade stenosis. Superior cerebral arteries patent proximally. Both of the posterior cerebral arteries primarily supplied via the basilar. PCAs demonstrate extensive atheromatous irregularity bilaterally. Multifocal moderate to severe P1 and P2 stenoses, greater on the right. PCAs are patent to their distal aspects. Venous sinuses: Grossly patent, although not well evaluated due to timing of the contrast bolus. Anatomic variants: None significant. No intracranial aneurysm or vascular abnormality. Delayed phase: No pathologic enhancement. Review of the MIP images confirms the above findings IMPRESSION: 1. Negative CTA for large vessel occlusion. 2. Atheromatous stenoses of approximately 60% about the carotid  bifurcations/proximal ICAs bilaterally. 3. Attenuation of the distal left MCA branches, most likely related to chronic left MCA territory infarct. 4. Moderate to severe left V1 and V2 stenoses as above. 5. Advanced intracranial atherosclerotic disease throughout the remaining intracranial circulation, most notable within the carotid siphons, right worse than left. Electronically Signed   By: Jeannine Boga M.D.   On: 02/10/2018 02:50   Dg Chest 2 View  Result Date: 02/09/2018 CLINICAL DATA:  Altered mental status. EXAM: CHEST - 2 VIEW COMPARISON:  Chest x-ray dated June 11, 2014. FINDINGS: The heart size and mediastinal contours are within normal limits. Prior CABG. Normal pulmonary vascularity. Unchanged elevation of the left hemidiaphragm. No focal consolidation, pleural effusion, or pneumothorax. No acute osseous abnormality. IMPRESSION: No active cardiopulmonary disease. Electronically Signed   By: Titus Dubin M.D.   On: 02/09/2018 12:39   Ct Head Wo Contrast  Result Date: 02/09/2018 CLINICAL DATA:  Slurred speech beginning today. EXAM: CT HEAD WITHOUT CONTRAST TECHNIQUE: Contiguous axial images were obtained from the base of the skull through the vertex without intravenous contrast. COMPARISON:  Head CT scan 08/04/2015.  Brain MRI 0 05/13/2011. FINDINGS: Brain: No evidence of acute infarction, hemorrhage, hydrocephalus, extra-axial collection or mass lesion/mass effect. Chronic microvascular ischemic change and remote bilateral frontal infarcts are again seen. Vascular: Atherosclerosis noted. Skull: Intact. Sinuses/Orbits: Mucosal thickening and calcifications in the left sphenoid sinus are unchanged. No acute finding. Other: None. IMPRESSION: No acute abnormality. Atrophy, chronic microvascular ischemic change remote bilateral frontal infarcts. Atherosclerosis. Chronic left sphenoid sinus disease. Electronically Signed   By: Inge Rise M.D.   On: 02/09/2018 12:49   Ct Angio Neck W Or  Wo Contrast  Result Date: 02/10/2018 CLINICAL DATA:  Initial evaluation for acute stroke, garbled speech. EXAM: CT ANGIOGRAPHY HEAD AND NECK TECHNIQUE: Multidetector CT  imaging of the head and neck was performed using the standard protocol during bolus administration of intravenous contrast. Multiplanar CT image reconstructions and MIPs were obtained to evaluate the vascular anatomy. Carotid stenosis measurements (when applicable) are obtained utilizing NASCET criteria, using the distal internal carotid diameter as the denominator. CONTRAST:  53mL ISOVUE-370 IOPAMIDOL (ISOVUE-370) INJECTION 76% COMPARISON:  Prior CT from earlier the same day. FINDINGS: CTA NECK FINDINGS Aortic arch: Visualized aortic arch of normal caliber with normal 3 vessel morphology. Moderate atheromatous plaque throughout the visualized arch. No high-grade stenosis at the origin of the great vessels. Short-segment approximate 50% stenosis at the proximal left subclavian artery (series 10, image 273). Visualized subclavian arteries otherwise patent. Note made of an additional short-segment moderate stenosis at the left axillary artery (series 10, image 290). Right carotid system: Right common carotid artery widely patent from its origin to the bifurcation without stenosis. Atheromatous plaque about the proximal left ICA right ICA with associated stenosis of up to approximately 60% by NASCET criteria. Exact delineation somewhat difficult given motion artifact through this region. Right ICA widely patent distally to the skull base without stenosis, dissection or occlusion. Left carotid system: Left common carotid artery patent from its origin to the bifurcation without significant stenosis. Bulky calcified plaque about the left bifurcation with associated stenosis of up to approximately 60% by NASCET criteria. Again, evaluation somewhat limited by motion artifact. Left ICA patent distally to the skull base without stenosis, dissection, or  occlusion. Vertebral arteries: Both of the vertebral arteries arise from the subclavian arteries. Focal plaque at the origin of the vertebral arteries bilaterally with secondary moderate narrowing on the left and more mild narrowing on the right. Right vertebral artery demonstrates scattered atheromatous irregularity but is patent within the neck without high-grade stenosis. There is focal severe proximal left V2 stenosis at the level of C6-7 (series 10, image 236). Multifocal irregularity with additional severe stenosis just distally at the level of C5 (series 10, image 222). This is likely due to extrinsic compression from uncovertebral disease. Left vertebral artery otherwise patent to the skull base. Skeleton: No acute osseus abnormality. No worrisome lytic or blastic osseous lesions identified. Moderate degenerate spondylolysis with facet arthrosis noted within cervical spine. Other neck: No acute soft tissue abnormality within the neck. No adenopathy. Salivary glands normal. Thyroid within normal limits. Upper chest: Visualized upper chest demonstrates no acute abnormality. Scattered atelectatic changes seen throughout the lungs. Advanced 3 vessel coronary artery calcifications noted. Median sternotomy wires noted. Review of the MIP images confirms the above findings CTA HEAD FINDINGS Anterior circulation: Pre cavernous petrous segments widely patent bilaterally. Smooth atheromatous plaque throughout the cavernous/supraclinoid ICAs with moderate diffuse narrowing, most pronounced at the supraclinoid right ICA. ICA termini widely patent. A1 segments patent bilaterally. Normal anterior communicating artery. Atheromatous irregularity within the anterior cerebral arteries without high-grade stenosis. M1 segments patent without high-grade stenosis. No proximal M2 occlusion. Distal left MCA branches are attenuated as compared to the right, likely related to chronic left MCA territory infarct. Extensive small vessel  atheromatous irregularity noted. Posterior circulation: Vertebral arteries demonstrate multifocal atheromatous irregularity without high-grade flow-limiting stenosis. Right vertebral artery slightly dominant. Partially visualized posterior inferior cerebral arteries patent bilaterally. Atheromatous irregularity throughout the basilar artery without high-grade stenosis. Superior cerebral arteries patent proximally. Both of the posterior cerebral arteries primarily supplied via the basilar. PCAs demonstrate extensive atheromatous irregularity bilaterally. Multifocal moderate to severe P1 and P2 stenoses, greater on the right. PCAs are patent to their distal aspects.  Venous sinuses: Grossly patent, although not well evaluated due to timing of the contrast bolus. Anatomic variants: None significant. No intracranial aneurysm or vascular abnormality. Delayed phase: No pathologic enhancement. Review of the MIP images confirms the above findings IMPRESSION: 1. Negative CTA for large vessel occlusion. 2. Atheromatous stenoses of approximately 60% about the carotid bifurcations/proximal ICAs bilaterally. 3. Attenuation of the distal left MCA branches, most likely related to chronic left MCA territory infarct. 4. Moderate to severe left V1 and V2 stenoses as above. 5. Advanced intracranial atherosclerotic disease throughout the remaining intracranial circulation, most notable within the carotid siphons, right worse than left. Electronically Signed   By: Jeannine Boga M.D.   On: 02/10/2018 02:50     Sherene Sires, DO 02/10/2018, 9:13 AM PGY-1, [place] Intern pager: [phone], text pages welcome

## 2018-02-10 NOTE — Progress Notes (Signed)
Occupational Therapy Evaluation Patient Details Name: [name] MRN: [id number] DOB: Apr 21, 1935 Today's Date: 02/10/2018    History of Present Illness Pt is an 83 y/o female admitted on 02/09/18 secondary to facial droop and aphasia. Pt found to have a L MCA infarct. PMH including but not limited to dementia, HTN, HLD, CAD, DM, CKD II, Hypothyroidism, RAS, CAD s/p CABG, ischemic cardiomyopathy and prior CVAs.   Clinical Impression   PTA, pt lived with family and completed her ADL and ambulated without an AD. Pt was "forgetful" but able to converse with family members. Daughter states "this is a dramatic change" and became very tearful. Pt currently total A +2 with mobility and total A with ADL; however, pt apparently had Ativan at some point last night. Will follow acutely to maximize functional level of independence and facilitate DC to next venue of care.     Follow Up Recommendations  SNF;Supervision/Assistance - 24 hour    Equipment Recommendations  Other (comment)(TBA)    Recommendations for Other Services       Precautions / Restrictions Precautions Precautions: Fall Restrictions Weight Bearing Restrictions: No      Mobility Bed Mobility Overal bed mobility: Needs Assistance Bed Mobility: Rolling;Sidelying to Sit;Sit to Supine Rolling: Total assist;+2 for physical assistance Sidelying to sit: Total assist;+2 for physical assistance   Sit to supine: Total assist;+2 for physical assistance   General bed mobility comments: total A for all aspects; pt did not attempt to initiate any movement, very lethargic throughout  Transfers Overall transfer level: Needs assistance Equipment used: 2 person hand held assist Transfers: Sit to/from Stand Sit to Stand: Total assist;+2 physical assistance         General transfer comment: performed x2 from EOB; pt tolerated partial WB'ing through bilateral LEs    Balance Overall balance assessment: Needs  assistance Sitting-balance support: Feet supported Sitting balance-Leahy Scale: Poor     Standing balance support: During functional activity;Bilateral upper extremity supported Standing balance-Leahy Scale: Zero                             ADL either performed or assessed with clinical judgement   ADL Overall ADL's : Needs assistance/impaired                                     Functional mobility during ADLs: Total assistance;+2 for physical assistance General ADL Comments: total A     Vision   Vision Assessment?: Vision impaired- to be further tested in functional context Additional Comments: eyes closed majority of session but pt did open both eyes after sitting up     Perception     Praxis Praxis Praxis-Other Comments: deficits; will further assess    Pertinent Vitals/Pain Pain Assessment: Faces Faces Pain Scale: Hurts even more Pain Location: general grimacing with oral care Pain Descriptors / Indicators: Grimacing Pain Intervention(s): Limited activity within patient's tolerance     Hand Dominance     Extremity/Trunk Assessment Upper Extremity Assessment Upper Extremity Assessment: Generalized weakness;RUE deficits/detail RUE Deficits / Details: RUE appeasr weaker than left; will further assess; spontaneous movemetn noted with Lable to hold washcloth twith L hand RUE Coordination: decreased fine motor;decreased gross motor   Lower Extremity Assessment Lower Extremity Assessment: Defer to PT evaluation   Cervical / Trunk Assessment Cervical / Trunk Assessment: Kyphotic   Communication Communication Communication: Expressive difficulties;Receptive  difficulties(pt attempting to make verbalizations x 2 but unable to under)   Cognition Arousal/Alertness: Lethargic;Suspect due to medications Behavior During Therapy: Flat affect Overall Cognitive Status: No family/caregiver present to determine baseline cognitive functioning                                  General Comments: forgetful but able to hold conversations with family and complete her ADL tasks; family did medicaitons and cooking   General Comments       Exercises     Shoulder Instructions      Home Living Family/patient expects to be discharged to:: Skilled nursing facility                                 Additional Comments: Spoke to family after evaluation - pt lived at home with family and was able to hold a converstaion with her family but frequently repeated herself. Pt ableto complete her own aDL tasks and was independent with mobility.       Prior Functioning/Environment Level of Independence: Independent        Comments: see above        OT Problem List: Decreased strength;Decreased range of motion;Decreased activity tolerance;Impaired balance (sitting and/or standing);Decreased cognition;Decreased coordination;Impaired vision/perception;Decreased safety awareness;Decreased knowledge of use of DME or AE;Impaired sensation;Impaired tone;Impaired UE functional use      OT Treatment/Interventions: Self-care/ADL training;Therapeutic exercise;Neuromuscular education;DME and/or AE instruction;Therapeutic activities;Cognitive remediation/compensation;Visual/perceptual remediation/compensation;Patient/family education;Balance training    OT Goals(Current goals can be found in the care plan section) Acute Rehab OT Goals Patient Stated Goal: unable to state OT Goal Formulation: Patient unable to participate in goal setting Time For Goal Achievement: 02/24/18 Potential to Achieve Goals: Fair  OT Frequency: Min 2X/week   Barriers to D/C:            Co-evaluation PT/OT/SLP Co-Evaluation/Treatment: Yes Reason for Co-Treatment: Complexity of the patient's impairments (multi-system involvement);Necessary to address cognition/behavior during functional activity;For patient/therapist safety PT goals addressed during  session: Mobility/safety with mobility;Balance OT goals addressed during session: ADL's and self-care      AM-PAC PT "6 Clicks" Daily Activity     Outcome Measure Help from another person eating meals?: Total Help from another person taking care of personal grooming?: Total Help from another person toileting, which includes using toliet, bedpan, or urinal?: Total Help from another person bathing (including washing, rinsing, drying)?: Total Help from another person to put on and taking off regular upper body clothing?: Total Help from another person to put on and taking off regular lower body clothing?: Total 6 Click Score: 6   End of Session Equipment Utilized During Treatment: Gait belt Nurse Communication: Mobility status  Activity Tolerance: Patient limited by lethargy Patient left: in bed;with call bell/phone within reach;with bed alarm set(chair position)  OT Visit Diagnosis: Unsteadiness on feet (R26.81);Other abnormalities of gait and mobility (R26.89);Muscle weakness (generalized) (M62.81);Other symptoms and signs involving cognitive function;Cognitive communication deficit (R41.841) Symptoms and signs involving cognitive functions: Cerebral infarction                Time: 4854-6270 OT Time Calculation (min): 19 min Charges:  OT General Charges $OT Visit: 1 Visit OT Evaluation $OT Eval Moderate Complexity: 1 Mod G-Codes:     [facility], OT/L  [phone] 02/10/2018  Mikeisha Lemonds,HILLARY 02/10/2018, 12:38 PM

## 2018-02-10 NOTE — Progress Notes (Signed)
SLP Cancellation Note  Patient Details Name: [name] MRN: [id number] DOB: 09-30-1935   Cancelled treatment:       Reason Eval/Treat Not Completed: Fatigue/lethargy limiting ability to participate; despite numerous attempts (I.e.: sensory stimulation via sternal rub, cold compress, etc.), pt would not awaken for BSE.   Elvina Sidle, M.S., CCC-SLP 02/10/2018, 11:02 AM

## 2018-02-10 NOTE — Discharge Summary (Signed)
[facility] Discharge Summary  Patient name: [name] Medical record number: [id number] Date of birth: Jan 18, 1935 Age: 83 y.o. Gender: female Date of Admission: 02/09/2018  Date of Discharge: 02/13/18 Admitting Physician: Leeanne Rio, MD  Primary Care Provider: Cyndi Bender, PA-C Consultants: neurology/cardiology  Indication for Hospitalization: stroke symptoms  Discharge Diagnoses/Problem List:  Expressive aphasia in setting of stroke HFrEF 25% Hypothyroidism HTN CAD DJD HLD S/p CABGx3  Disposition: to CIR  Discharge Condition: stable  Discharge Exam: General: sleeping, vitals stable Cardiovascular: RRR, 3/6 murmur, no LE edema Respiratory: CTA B from posterior, some expiratory wheezes on anterior ausculation, normal work of breathing Gastrointestinal: soft, nondistended MSK: sleeping, was moving well last night Derm: no rashes appreciated on exposed skin Neuro: patient sleeping  Brief Hospital Course:  Patient presented stroke symptoms (expressive aphasia the most obvious) in the morning with last known normal that night prior.  She came to ED and was given stroke workup with CT neg for bleed but did show chronic changes from prior infarcts.  She was very physically active and need a lot of redirection to stay in bed.  Communication was not reliable enough for standard neuro exam but she did have active motion of all limbs.  She was too agitated to stay still for a full MRI.  Echo from workup indicated newly reduced EF to 25% from 40-45%.  Cardiology did not think she was a good candidate for invasive intervention and recommended primarily medical management.    Family had significant discussions about goals of care.  They were adamant about no SNF placement but did agree to utility of CIR with plans to d/c to home with home health after.  Issues for Follow Up:  1. Does not tolerate statin or brilinta.  Does not respond to plavix.   2. TSH was initially slightly low so synthroid was reduced to 75 from 100 3. Permissive HTN per neuro through 3/27-3/29.  4. Neurology secommend anticoagulation in 3 days (02/13/18), preferably DOAC, for stroke prevention with repeat TTE in 2-3 months, once EF >= 35%, anticoagulation can be discontinued.  Given patient's clinical history (hasbled4) we believe this is contraindicated and patient is at higher risk of bleed.  We discussed this concern with the family and they would like some time to discuss their opinion on this decision. 5. Patient will need f/u with neurology in 4-6 weeks. 6. Avoid beta blockers per cardiology  Significant Procedures:   Significant Labs and Imaging:  Recent Labs  Lab 02/09/18 1306 02/09/18 1323 02/12/18 1017 02/13/18 0417  WBC 7.6  --  6.4 9.0  HGB 13.7 13.3 13.2 14.0  HCT 40.0 39.0 38.3 39.5  PLT 156  --  157 161   Recent Labs  Lab 02/09/18 1306 02/09/18 1323 02/11/18 0527 02/12/18 1017 02/13/18 0417  NA 139 143 142 138 137  K 3.8 3.9 3.7 3.7 3.5  CL 107 106 107 105 105  CO2 23  --  21* 22 20*  GLUCOSE 163* 159* 129* 322* 222*  BUN 14 15 10 12 12   CREATININE 1.09* 1.00 0.98 1.25* 1.12*  CALCIUM 8.9  --  9.1 8.9 8.8*  ALKPHOS 82  --   --   --   --   AST 25  --   --   --   --   ALT 19  --   --   --   --   ALBUMIN 3.3*  --   --   --   --  Ct Angio Head W Or Wo Contrast  Result Date: 02/10/2018 CLINICAL DATA:  Initial evaluation for acute stroke, garbled speech. EXAM: CT ANGIOGRAPHY HEAD AND NECK TECHNIQUE: Multidetector CT imaging of the head and neck was performed using the standard protocol during bolus administration of intravenous contrast. Multiplanar CT image reconstructions and MIPs were obtained to evaluate the vascular anatomy. Carotid stenosis measurements (when applicable) are obtained utilizing NASCET criteria, using the distal internal carotid diameter as the denominator. CONTRAST:  6mL ISOVUE-370 IOPAMIDOL (ISOVUE-370)  INJECTION 76% COMPARISON:  Prior CT from earlier the same day. FINDINGS: CTA NECK FINDINGS Aortic arch: Visualized aortic arch of normal caliber with normal 3 vessel morphology. Moderate atheromatous plaque throughout the visualized arch. No high-grade stenosis at the origin of the great vessels. Short-segment approximate 50% stenosis at the proximal left subclavian artery (series 10, image 273). Visualized subclavian arteries otherwise patent. Note made of an additional short-segment moderate stenosis at the left axillary artery (series 10, image 290). Right carotid system: Right common carotid artery widely patent from its origin to the bifurcation without stenosis. Atheromatous plaque about the proximal left ICA right ICA with associated stenosis of up to approximately 60% by NASCET criteria. Exact delineation somewhat difficult given motion artifact through this region. Right ICA widely patent distally to the skull base without stenosis, dissection or occlusion. Left carotid system: Left common carotid artery patent from its origin to the bifurcation without significant stenosis. Bulky calcified plaque about the left bifurcation with associated stenosis of up to approximately 60% by NASCET criteria. Again, evaluation somewhat limited by motion artifact. Left ICA patent distally to the skull base without stenosis, dissection, or occlusion. Vertebral arteries: Both of the vertebral arteries arise from the subclavian arteries. Focal plaque at the origin of the vertebral arteries bilaterally with secondary moderate narrowing on the left and more mild narrowing on the right. Right vertebral artery demonstrates scattered atheromatous irregularity but is patent within the neck without high-grade stenosis. There is focal severe proximal left V2 stenosis at the level of C6-7 (series 10, image 236). Multifocal irregularity with additional severe stenosis just distally at the level of C5 (series 10, image 222). This is  likely due to extrinsic compression from uncovertebral disease. Left vertebral artery otherwise patent to the skull base. Skeleton: No acute osseus abnormality. No worrisome lytic or blastic osseous lesions identified. Moderate degenerate spondylolysis with facet arthrosis noted within cervical spine. Other neck: No acute soft tissue abnormality within the neck. No adenopathy. Salivary glands normal. Thyroid within normal limits. Upper chest: Visualized upper chest demonstrates no acute abnormality. Scattered atelectatic changes seen throughout the lungs. Advanced 3 vessel coronary artery calcifications noted. Median sternotomy wires noted. Review of the MIP images confirms the above findings CTA HEAD FINDINGS Anterior circulation: Pre cavernous petrous segments widely patent bilaterally. Smooth atheromatous plaque throughout the cavernous/supraclinoid ICAs with moderate diffuse narrowing, most pronounced at the supraclinoid right ICA. ICA termini widely patent. A1 segments patent bilaterally. Normal anterior communicating artery. Atheromatous irregularity within the anterior cerebral arteries without high-grade stenosis. M1 segments patent without high-grade stenosis. No proximal M2 occlusion. Distal left MCA branches are attenuated as compared to the right, likely related to chronic left MCA territory infarct. Extensive small vessel atheromatous irregularity noted. Posterior circulation: Vertebral arteries demonstrate multifocal atheromatous irregularity without high-grade flow-limiting stenosis. Right vertebral artery slightly dominant. Partially visualized posterior inferior cerebral arteries patent bilaterally. Atheromatous irregularity throughout the basilar artery without high-grade stenosis. Superior cerebral arteries patent proximally. Both of the posterior cerebral arteries  primarily supplied via the basilar. PCAs demonstrate extensive atheromatous irregularity bilaterally. Multifocal moderate to severe P1  and P2 stenoses, greater on the right. PCAs are patent to their distal aspects. Venous sinuses: Grossly patent, although not well evaluated due to timing of the contrast bolus. Anatomic variants: None significant. No intracranial aneurysm or vascular abnormality. Delayed phase: No pathologic enhancement. Review of the MIP images confirms the above findings IMPRESSION: 1. Negative CTA for large vessel occlusion. 2. Atheromatous stenoses of approximately 60% about the carotid bifurcations/proximal ICAs bilaterally. 3. Attenuation of the distal left MCA branches, most likely related to chronic left MCA territory infarct. 4. Moderate to severe left V1 and V2 stenoses as above. 5. Advanced intracranial atherosclerotic disease throughout the remaining intracranial circulation, most notable within the carotid siphons, right worse than left. Electronically Signed   By: Jeannine Boga M.D.   On: 02/10/2018 02:50   Dg Chest 2 View  Result Date: 02/09/2018 CLINICAL DATA:  Altered mental status. EXAM: CHEST - 2 VIEW COMPARISON:  Chest x-ray dated June 11, 2014. FINDINGS: The heart size and mediastinal contours are within normal limits. Prior CABG. Normal pulmonary vascularity. Unchanged elevation of the left hemidiaphragm. No focal consolidation, pleural effusion, or pneumothorax. No acute osseous abnormality. IMPRESSION: No active cardiopulmonary disease. Electronically Signed   By: Titus Dubin M.D.   On: 02/09/2018 12:39   Ct Head Wo Contrast  Result Date: 02/09/2018 CLINICAL DATA:  Slurred speech beginning today. EXAM: CT HEAD WITHOUT CONTRAST TECHNIQUE: Contiguous axial images were obtained from the base of the skull through the vertex without intravenous contrast. COMPARISON:  Head CT scan 08/04/2015.  Brain MRI 0 05/13/2011. FINDINGS: Brain: No evidence of acute infarction, hemorrhage, hydrocephalus, extra-axial collection or mass lesion/mass effect. Chronic microvascular ischemic change and remote  bilateral frontal infarcts are again seen. Vascular: Atherosclerosis noted. Skull: Intact. Sinuses/Orbits: Mucosal thickening and calcifications in the left sphenoid sinus are unchanged. No acute finding. Other: None. IMPRESSION: No acute abnormality. Atrophy, chronic microvascular ischemic change remote bilateral frontal infarcts. Atherosclerosis. Chronic left sphenoid sinus disease. Electronically Signed   By: Inge Rise M.D.   On: 02/09/2018 12:49   Ct Angio Neck W Or Wo Contrast  Result Date: 02/10/2018 CLINICAL DATA:  Initial evaluation for acute stroke, garbled speech. EXAM: CT ANGIOGRAPHY HEAD AND NECK TECHNIQUE: Multidetector CT imaging of the head and neck was performed using the standard protocol during bolus administration of intravenous contrast. Multiplanar CT image reconstructions and MIPs were obtained to evaluate the vascular anatomy. Carotid stenosis measurements (when applicable) are obtained utilizing NASCET criteria, using the distal internal carotid diameter as the denominator. CONTRAST:  23mL ISOVUE-370 IOPAMIDOL (ISOVUE-370) INJECTION 76% COMPARISON:  Prior CT from earlier the same day. FINDINGS: CTA NECK FINDINGS Aortic arch: Visualized aortic arch of normal caliber with normal 3 vessel morphology. Moderate atheromatous plaque throughout the visualized arch. No high-grade stenosis at the origin of the great vessels. Short-segment approximate 50% stenosis at the proximal left subclavian artery (series 10, image 273). Visualized subclavian arteries otherwise patent. Note made of an additional short-segment moderate stenosis at the left axillary artery (series 10, image 290). Right carotid system: Right common carotid artery widely patent from its origin to the bifurcation without stenosis. Atheromatous plaque about the proximal left ICA right ICA with associated stenosis of up to approximately 60% by NASCET criteria. Exact delineation somewhat difficult given motion artifact through  this region. Right ICA widely patent distally to the skull base without stenosis, dissection or occlusion. Left carotid  system: Left common carotid artery patent from its origin to the bifurcation without significant stenosis. Bulky calcified plaque about the left bifurcation with associated stenosis of up to approximately 60% by NASCET criteria. Again, evaluation somewhat limited by motion artifact. Left ICA patent distally to the skull base without stenosis, dissection, or occlusion. Vertebral arteries: Both of the vertebral arteries arise from the subclavian arteries. Focal plaque at the origin of the vertebral arteries bilaterally with secondary moderate narrowing on the left and more mild narrowing on the right. Right vertebral artery demonstrates scattered atheromatous irregularity but is patent within the neck without high-grade stenosis. There is focal severe proximal left V2 stenosis at the level of C6-7 (series 10, image 236). Multifocal irregularity with additional severe stenosis just distally at the level of C5 (series 10, image 222). This is likely due to extrinsic compression from uncovertebral disease. Left vertebral artery otherwise patent to the skull base. Skeleton: No acute osseus abnormality. No worrisome lytic or blastic osseous lesions identified. Moderate degenerate spondylolysis with facet arthrosis noted within cervical spine. Other neck: No acute soft tissue abnormality within the neck. No adenopathy. Salivary glands normal. Thyroid within normal limits. Upper chest: Visualized upper chest demonstrates no acute abnormality. Scattered atelectatic changes seen throughout the lungs. Advanced 3 vessel coronary artery calcifications noted. Median sternotomy wires noted. Review of the MIP images confirms the above findings CTA HEAD FINDINGS Anterior circulation: Pre cavernous petrous segments widely patent bilaterally. Smooth atheromatous plaque throughout the cavernous/supraclinoid ICAs with  moderate diffuse narrowing, most pronounced at the supraclinoid right ICA. ICA termini widely patent. A1 segments patent bilaterally. Normal anterior communicating artery. Atheromatous irregularity within the anterior cerebral arteries without high-grade stenosis. M1 segments patent without high-grade stenosis. No proximal M2 occlusion. Distal left MCA branches are attenuated as compared to the right, likely related to chronic left MCA territory infarct. Extensive small vessel atheromatous irregularity noted. Posterior circulation: Vertebral arteries demonstrate multifocal atheromatous irregularity without high-grade flow-limiting stenosis. Right vertebral artery slightly dominant. Partially visualized posterior inferior cerebral arteries patent bilaterally. Atheromatous irregularity throughout the basilar artery without high-grade stenosis. Superior cerebral arteries patent proximally. Both of the posterior cerebral arteries primarily supplied via the basilar. PCAs demonstrate extensive atheromatous irregularity bilaterally. Multifocal moderate to severe P1 and P2 stenoses, greater on the right. PCAs are patent to their distal aspects. Venous sinuses: Grossly patent, although not well evaluated due to timing of the contrast bolus. Anatomic variants: None significant. No intracranial aneurysm or vascular abnormality. Delayed phase: No pathologic enhancement. Review of the MIP images confirms the above findings IMPRESSION: 1. Negative CTA for large vessel occlusion. 2. Atheromatous stenoses of approximately 60% about the carotid bifurcations/proximal ICAs bilaterally. 3. Attenuation of the distal left MCA branches, most likely related to chronic left MCA territory infarct. 4. Moderate to severe left V1 and V2 stenoses as above. 5. Advanced intracranial atherosclerotic disease throughout the remaining intracranial circulation, most notable within the carotid siphons, right worse than left. Electronically Signed   By:  Jeannine Boga M.D.   On: 02/10/2018 02:50    Results/Tests Pending at Time of Discharge:   Discharge Medications:  Allergies as of 02/13/2018      Reactions   Brilinta [ticagrelor] Shortness Of Breath   Plavix [clopidogrel Bisulfate] Other (See Comments)   NOT A RESPONDER- P2Y12 ASSAY WAS 224 (09/02/14)   Statins Other (See Comments)   Lethargy, muscle pain      Medication List    STOP taking these medications   clopidogrel 75 MG  tablet Commonly known as:  PLAVIX   furosemide 20 MG tablet Commonly known as:  LASIX   ibuprofen 200 MG tablet Commonly known as:  ADVIL,MOTRIN   LANTUS SOLOSTAR 100 UNIT/ML Solostar Pen Generic drug:  Insulin Glargine   metFORMIN 500 MG 24 hr tablet Commonly known as:  GLUCOPHAGE-XR   metoprolol succinate 25 MG 24 hr tablet Commonly known as:  TOPROL-XL   nitroGLYCERIN 0.4 MG SL tablet Commonly known as:  NITROSTAT   pantoprazole 40 MG tablet Commonly known as:  PROTONIX     TAKE these medications   acetaminophen 325 MG tablet Commonly known as:  TYLENOL Take 2 tablets (650 mg total) by mouth every 4 (four) hours as needed for mild pain (or temp > 37.5 C (99.5 F)).   aspirin 325 MG EC tablet Take 1 tablet (325 mg total) by mouth daily. Start taking on:  02/14/2018 What changed:    medication strength  how much to take   ezetimibe 10 MG tablet Commonly known as:  ZETIA Take 1 tablet (10 mg total) by mouth daily. Start taking on:  02/14/2018   insulin aspart 100 UNIT/ML injection Commonly known as:  novoLOG Inject 0-9 Units into the skin 3 (three) times daily with meals.   levothyroxine 75 MCG tablet Commonly known as:  SYNTHROID, LEVOTHROID Take 1 tablet (75 mcg total) by mouth daily before breakfast. Start taking on:  02/14/2018 What changed:    medication strength  how much to take  when to take this   lisinopril 5 MG tablet Commonly known as:  PRINIVIL,ZESTRIL Take 1 tablet (5 mg total) by mouth  daily. Start taking on:  02/14/2018       Discharge Instructions: Please refer to Patient Instructions section of EMR for full details.  Patient was counseled important signs and symptoms that should prompt return to medical care, changes in medications, dietary instructions, activity restrictions, and follow up appointments.   Follow-Up Appointments: Follow-up Information    [facility], [facility] Follow up.   Specialty:  [facility] Contact information: [place] [address] [phone]        [facility]. Schedule an appointment as soon as possible for a visit in 4 week(s).   Specialty:  Neurology Contact information: [address] [place], [place], DO 02/13/2018, 12:11 PM PGY-1, [place]

## 2018-02-10 NOTE — Progress Notes (Signed)
STROKE TEAM PROGRESS NOTE   SUBJECTIVE (INTERVAL HISTORY) Her daughter and husband are at the bedside.  Patient still has global aphasia, left gaze preference, and right mild hemiparesis.  EF 25%, down from 40-45% in the past, will need to consider cardiology consultation.   OBJECTIVE Temp:  [97.5 F (36.4 C)-98.6 F (37 C)] 98.6 F (37 C) (03/23 0802) Pulse Rate:  [52-70] 55 (03/23 0802) Cardiac Rhythm: Sinus bradycardia (03/23 0700) Resp:  [13-18] 18 (03/23 0802) BP: (128-190)/(69-122) 157/69 (03/23 0802) SpO2:  [95 %-100 %] 100 % (03/23 0802)  CBC:  Recent Labs  Lab 02/09/18 1306 02/09/18 1323  WBC 7.6  --   NEUTROABS 4.9  --   HGB 13.7 13.3  HCT 40.0 39.0  MCV 93.9  --   PLT 156  --     Basic Metabolic Panel:  Recent Labs  Lab 02/09/18 1306 02/09/18 1323  NA 139 143  K 3.8 3.9  CL 107 106  CO2 23  --   GLUCOSE 163* 159*  BUN 14 15  CREATININE 1.09* 1.00  CALCIUM 8.9  --     Lipid Panel:     Component Value Date/Time   CHOL 193 02/10/2018 0428   TRIG 125 02/10/2018 0428   HDL 37 (L) 02/10/2018 0428   CHOLHDL 5.2 02/10/2018 0428   VLDL 25 02/10/2018 0428   LDLCALC 131 (H) 02/10/2018 0428   HgbA1c:  Lab Results  Component Value Date   HGBA1C 8.6 (H) 02/10/2018   Urine Drug Screen:     Component Value Date/Time   LABOPIA NONE DETECTED 02/09/2018 1433   COCAINSCRNUR NONE DETECTED 02/09/2018 1433   LABBENZ NONE DETECTED 02/09/2018 1433   AMPHETMU NONE DETECTED 02/09/2018 1433   THCU NONE DETECTED 02/09/2018 1433   LABBARB NONE DETECTED 02/09/2018 1433    Alcohol Level     Component Value Date/Time   ETH <10 02/09/2018 1306    IMAGING I have personally reviewed the radiological images below and agree with the radiology interpretations.  Ct Angio Head W Or Wo Contrast Ct Angio Neck W Or Wo Contrast 02/10/2018 IMPRESSION:  1. Negative CTA for large vessel occlusion.  2. Atheromatous stenoses of approximately 60% about the carotid  bifurcations/proximal ICAs bilaterally.  3. Attenuation of the distal left MCA branches, most likely related to chronic left MCA territory infarct.  4. Moderate to severe left V1 and V2 stenoses as above.  5. Advanced intracranial atherosclerotic disease throughout the remaining intracranial circulation, most notable within the carotid siphons, right worse than left.   Dg Chest 2 View 02/09/2018 IMPRESSION:  No active cardiopulmonary disease.   Ct Head Wo Contrast 02/09/2018 IMPRESSION:  No acute abnormality. Atrophy, chronic microvascular ischemic change remote bilateral frontal infarcts. Atherosclerosis. Chronic left sphenoid sinus disease.   MR Brain Wo Contrast - pending  CT head pending  LE venous doppler pending  TEE pending  EEG This is an abnormal EEG due to intermittent frontal slowing (FIRDA).   FIRDA is a non-specific finding that can be seen with toxic, metabolic, diffuse, or multifocal structural processes.  No definite epileptiform changes were noted.   A single EEG without epileptiform changes does not exclude the diagnosis of epilepsy. Clinical correlation advised.  Transthoracic Echocardiogram - Left ventricle: Septal and apical akinesis inferior wall   hypokinesis. The cavity size was severely dilated. Wall thickness   was normal. The estimated ejection fraction was 25%. Doppler   parameters are consistent with both elevated ventricular   end-diastolic filling  pressure and elevated left atrial filling   pressure. - Aortic valve: Given morphology and decreased EF AS likely more in   moderae range despite low gradient. There was mild stenosis.   There was mild regurgitation. - Mitral valve: Calcified annulus. Mildly thickened leaflets . - Atrial septum: No defect or patent foramen ovale was identified.   PHYSICAL EXAM Temp:  [97.5 F (36.4 C)-98.6 F (37 C)] 97.6 F (36.4 C) (03/23 1204) Pulse Rate:  [55-70] 64 (03/23 1204) Resp:  [17-18] 18 (03/23  1204) BP: (144-190)/(57-122) 157/57 (03/23 1204) SpO2:  [95 %-100 %] 98 % (03/23 1204)  General - Well nourished, well developed, in no apparent distress.  Ophthalmologic - Fundi not visualized due to noncooperation.  Cardiovascular - Regular rate and rhythm.  Neuro -awake, alert, eyes open, not following commands, able to tell me her name is [name], however no any other sensible words, not able to name or repeat.  Left gaze preference, barely cross midline, right neglect, not blinking to visual threat on the right.  Right facial droop, tongue midline, right upper extremity and lower extremity 4/5. Sensation, coordination and gait not tested.   ASSESSMENT/PLAN [name] is a 83 y.o. female with history of coronary artery disease, hypothyroidism, hyperlipidemia, renal artery stenosis, previous DVT, history of breast cancer, diabetes mellitus, dementia, previous strokes, chronic kidney disease, and cardiomyopathy presenting with aphasia and right-sided weakness. She did not receive IV t-PA due to late presentation.  Stroke - left MCA territory -embolic pattern, likely due to low EF  Resultant global aphasia, right hemiparesis, right facial droop, right neglect, left gaze  CT head - No acute abnormality. Remote bilateral frontal infarcts  MRI head - not cooperative  EEG - FIRDA but no seizure  CTA H&N - bilateral ICA proximal 60% stenosis, left M2 stenosis  2D Echo - EF 25%, down from 40-45% in the past  TEE pending  LE venous Doppler pending  CT repeat pending  LDL - 131  HgbA1c - 8.6  VTE prophylaxis - SCDs Fall precautions Fall precautions Diet NPO time specified  aspirin 81 mg daily and clopidogrel 75 mg daily prior to admission, now on aspirin 300 mg suppository daily.   Patient counseled to be compliant with her antithrombotic medications  Ongoing aggressive stroke risk factor management  Therapy recommendations:  SNF recommended  Disposition:   Pending  Cardiomyopathy with low EF  TTE showed EF 25%  08/2014 - EF 40-45%  Follows with Dr. Ellyn [name] in cardiology  Likely the source of embolic stroke  TEE pending  Consider cardiology consultation  History of TIA/stroke  TIA in 04/2009 and 06/2009  1988 CVA- right eye vision difficulty  2011 left frontal infarct  04/2011 -right-sided weakness and aphasia - CT showed old left frontal infarct -MRI showed a left PLIC/thalamic infarct  UTI  UA WBC TNTC  On Rocephin  Hypertension  Stable  Permissive hypertension (OK if < 220/120) but gradually normalize in 5-7 days  Long-term BP goal normotensive  Hyperlipidemia  Lipid lowering medication PTA: none  LDL 131, goal < 70  Current lipid lowering medication: none  History of statin intolerance.  Consider Zetia when taking POs  Diabetes  HgbA1c 8.6, goal < 7.0  Uncontrolled  SSI  CBG monitoring  Other Stroke Risk Factors  Advanced age  Coronary artery disease status post CABG and stenting  Left renal artery stenosis status post stent in 2010  Other Active Problems  Plavix nonresponder. Brilinta and statin intolerance.  Baseline dementia  Breast cancer status post chemotherapy, radiation and surgery in 2015   Hospital day # 1  Rosalin Hawking, MD PhD Stroke Neurology 02/10/2018 5:47 PM    To contact Stroke Continuity provider, please refer to [email or web address] After hours, contact General Neurology

## 2018-02-11 ENCOUNTER — Inpatient Hospital Stay (HOSPITAL_COMMUNITY): Payer: Medicare Other

## 2018-02-11 DIAGNOSIS — Z951 Presence of aortocoronary bypass graft: Secondary | ICD-10-CM

## 2018-02-11 DIAGNOSIS — I25119 Atherosclerotic heart disease of native coronary artery with unspecified angina pectoris: Secondary | ICD-10-CM

## 2018-02-11 LAB — BASIC METABOLIC PANEL
ANION GAP: 14 (ref 5–15)
BUN: 10 mg/dL (ref 6–20)
CHLORIDE: 107 mmol/L (ref 101–111)
CO2: 21 mmol/L — AB (ref 22–32)
Calcium: 9.1 mg/dL (ref 8.9–10.3)
Creatinine, Ser: 0.98 mg/dL (ref 0.44–1.00)
GFR calc Af Amer: >60 mL/min (ref >60)
GFR calc non Af Amer: 53 mL/min — ABNORMAL LOW (ref >60)
GLUCOSE: 129 mg/dL — AB (ref 65–99)
POTASSIUM: 3.7 mmol/L (ref 3.5–5.1)
Sodium: 142 mmol/L (ref 135–145)

## 2018-02-11 LAB — GLUCOSE, CAPILLARY
GLUCOSE-CAPILLARY: 145 mg/dL — AB (ref 65–99)
GLUCOSE-CAPILLARY: 119 mg/dL — AB (ref 65–99)
GLUCOSE-CAPILLARY: 178 mg/dL — AB (ref 65–99)
Glucose-Capillary: 160 mg/dL — ABNORMAL HIGH (ref 65–99)
Glucose-Capillary: 171 mg/dL — ABNORMAL HIGH (ref 65–99)
Glucose-Capillary: 196 mg/dL — ABNORMAL HIGH (ref 65–99)
Glucose-Capillary: 247 mg/dL — ABNORMAL HIGH (ref 65–99)

## 2018-02-11 MED ORDER — HALOPERIDOL LACTATE 5 MG/ML IJ SOLN
1.0000 mg | Freq: Once | INTRAMUSCULAR | Status: AC | PRN
Start: 2018-02-11 — End: 2018-02-12
  Administered 2018-02-12: 1 mg via INTRAVENOUS
  Filled 2018-02-11: qty 1

## 2018-02-11 MED ORDER — EZETIMIBE 10 MG PO TABS
10.0000 mg | ORAL_TABLET | Freq: Every day | ORAL | Status: DC
  Administered 2018-02-11 – 2018-02-13 (×3): 10 mg via ORAL
  Filled 2018-02-11 (×3): qty 1

## 2018-02-11 NOTE — NC FL2 (Signed)
[place] LEVEL OF CARE SCREENING TOOL     IDENTIFICATION  Patient Name: [name] Birthdate: 1935/02/21 Sex: female Admission Date (Current Location): 02/09/2018  [facility] and [place] Number:  Herbalist and Address:  [facility]. [facility], [place] [address] [place], [place] [address]      Provider Number: [id number]  Attending Physician Name and Address:  Leeanne Rio, MD  Relative Name and Phone Number:       Current Level of Care: Hospital Recommended Level of Care: [place] Prior Approval Number:    Date Approved/Denied:   PASRR Number: [id number]  Discharge Plan: SNF    Current Diagnoses: Patient Active Problem List   Diagnosis Date Noted  . Expressive aphasia   . Stroke ([place]) 02/09/2018  . Cardiomyopathy, ischemic- EF 45-50% at cath July 2015  06/27/2014  . Weakness 06/11/2014  . CAD S/P DES to Prox Cx & SVG-D1; PTCA of Anastomotic SVG-D1 lesion 06/05/2014  . NSTEMI (non-ST elevated myocardial infarction) ([place]) 06/04/2014  . Gout 03/13/2012  . History of breast cancer   . Chronic combined systolic and diastolic congestive heart failure, NYHA class 2 ([place]) 03/12/2012  . 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 Renal artery stenosis ([place]) 03/12/2012  . Essential hypertension 03/12/2012  . CVA (cerebral vascular accident) ([place]) 03/12/2012  . DM (diabetes mellitus), type 2, uncontrolled with complications ([place]) 94/76/5465  . Chronic kidney disease (CKD), stage II (mild) 03/12/2012  . Hypothyroidism- (TSH low, Synthroid held) 03/12/2012  . Hyperlipidemia with target LDL less than 70; and statin intolerant 03/12/2012  . Bradycardia 03/12/2012  . Syncope 03/12/2012  . History of DVT (deep vein thrombosis) 03/12/2012  . DJD (degenerative joint disease) of knee   . Hx of adenomatous colonic polyps     Orientation RESPIRATION  BLADDER Height & Weight     Self  Normal Continent Weight: 131 lb 13.4 oz (59.8 kg) Height:     BEHAVIORAL SYMPTOMS/MOOD NEUROLOGICAL BOWEL NUTRITION STATUS      Continent Diet(NPO at this time)  AMBULATORY STATUS COMMUNICATION OF NEEDS Skin   Extensive Assist Verbally Normal                       Personal Care Assistance Level of Assistance  Bathing, Feeding, Dressing Bathing Assistance: Maximum assistance Feeding assistance: Limited assistance Dressing Assistance: Maximum assistance     Functional Limitations Info  Sight, Hearing, Speech Sight Info: Adequate Hearing Info: Adequate Speech Info: Impaired(Expressive aphasia;Receptive aphasia;Incomprehensible)    SPECIAL CARE FACTORS FREQUENCY  PT (By licensed PT), OT (By licensed OT)     PT Frequency: 3x OT Frequency: 3x            Contractures Contractures Info: Not present    Additional Factors Info  Code Status, Allergies Code Status Info: Full Code Allergies Info: Brilinta Ticagrelor, Plavix Clopidogrel Bisulfate, Statins           Current Medications (02/11/2018):  This is the current hospital active medication list Current Facility-Administered Medications  Medication Dose Route Frequency Provider Last Rate Last Dose  . acetaminophen (TYLENOL) tablet 650 mg  650 mg Oral Q4H PRN Sherene Sires, DO       Or  . acetaminophen (TYLENOL) solution 650 mg  650 mg Per Tube Q4H PRN Sherene Sires, DO       Or  . acetaminophen (TYLENOL) suppository 650 mg  650 mg Rectal Q4H PRN Sherene Sires, DO   650 mg at 02/10/18 2000  . aspirin EC tablet 325 mg  325 mg Oral Daily Rosalin Hawking, MD       Or  . aspirin suppository 300 mg  300 mg Rectal Daily Rosalin Hawking, MD   300 mg at 02/10/18 1557  . cefTRIAXone (ROCEPHIN) 1 g in sodium chloride 0.9 % 100 mL IVPB  1 g Intravenous Q24H Sherene Sires, DO   Stopped at 02/10/18 1903  . dextrose 5 %-0.45 % sodium chloride infusion   Intravenous Continuous Diallo, Abdoulaye, MD   Stopped  at 02/10/18 0100  . insulin aspart (novoLOG) injection 0-9 Units  0-9 Units Subcutaneous TID WC Bland, Scott, DO   1 Units at 02/10/18 1834  . levothyroxine (SYNTHROID, LEVOTHROID) tablet 75 mcg  75 mcg Oral QAC breakfast Everrett Coombe, MD      . lisinopril (PRINIVIL,ZESTRIL) tablet 5 mg  5 mg Oral Daily Dorothy Spark, MD         Discharge Medications: Please see discharge summary for a list of discharge medications.  Relevant Imaging Results:  Relevant Lab Results:   Additional Information SSN: [id number]  Eileen Stanford, LCSW

## 2018-02-11 NOTE — Evaluation (Signed)
Clinical/Bedside Swallow Evaluation Patient Details  Name: [name] MRN: [id number] Date of Birth: 10-10-1935  Today's Date: 02/11/2018 Time: SLP Start Time (ACUTE ONLY): 1030 SLP Stop Time (ACUTE ONLY): 1105 SLP Time Calculation (min) (ACUTE ONLY): 35 min  Past Medical History:  Past Medical History:  Diagnosis Date  . Atherosclerosis of autologous vein coronary artery bypass graft with unstable angina pectoris    CATH: 80% prox SVG-D1, 95% anastomotic SVG-D1 lesion  . CAD (coronary artery disease), native coronary artery 2004   a) 0/'01: Complicated PCI-of pLAD wtih PTCA  (failed redo PTCA) --> CABG 06/2003 for ISR LAD & D1 (LIMA-LAD, SVG-dRCA, SVG-D1);; b) 4/'07: Patent grafts, Normal RHC pressures; c) NSTEMI 9/'10: No culprit; 1/'12: recanalized LAD stent, patent grafts  . CAD S/P percutaneous coronary angioplasty 06/05/2014   a) 1/04: BMS x 2 LAD, PTCA D1 followed by unusuccessful redo PTCA D1 --> initially turned down CABG initially then CABG in 06/2003;; b) 05/2014: DES to Prox Cx & SVG-D1; PTCA of Anastomotic SVG-D1 lesion  . 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 Grade 1 diastolic dysfunction  . Chronic combined systolic and diastolic congestive heart failure, NYHA class 2 07/2009   Mixed. 2D-echocardiogram (08/2010) - 35% to 40%. Diffuse hypokinesis. Grade 2 diastolic dysfunction.  . CKD (chronic kidney disease) stage 2, GFR 60-89 ml/min    BL Cr 1.6  . Dementia   . DJD (degenerative joint disease) of knee    left  . DM (diabetes mellitus), type 2, uncontrolled with complications DX: [id number]   Stopped of Metformin in 04/2011 in setting of AKI (Cr 3), now on insulin.  . Essential hypertension 03/12/2012  . History of breast cancer    Primary oncologist Dr. Brigitte Pulse. // First diagnosed  2000, recurrence in 06/2010. S/P left mastectomy with radiation therapy (2000), following recurrence, started chemothearpy, completed course (06/2010.)  . History of CVA (cerebrovascular accident) 11/2009; 8& 04/2009   left frontal lobe stroke (11/2009), TIA (06/2009, 04/2009) with multiple prior TIAs manifested by aphasia. H/o CVA in 1988 involving right eye.  [name] History of DVT of lower extremity 07/2003   in setting of saphenous vein grafting for CABG  . History of renal artery stenosis 2010   Left 95% stenosis s/p left renal artery stenting (09/2009), right renal artery with 50% stenosis.  [name] Hx of adenomatous colonic polyps    remote history. Last colonoscopy (03/2004) - Normal colonoscopy to the cecum, rec 5 year follow-up colonoscopy.  . Hyperlipidemia with target LDL less than 70   . Hypothyroidism   . S/P CABG x 3 06/2003   LIMA-LAD, SVG-D1, SVG-dRCA   Past Surgical History:  Past Surgical History:  Procedure Laterality Date  . CARDIAC CATHETERIZATION  12/10/2002   Successful stenting of LAD with Cutting Balloon angioplasty of diagonal #1. There was a residual type B dissection in first diagonal branch.   . CARDIAC CATHETERIZATION  01/14/2003   Ostial 80% narrowing of the LAD - procedure was terminated after 5 unsuccessful attempts to pass the balloon into the stents and down to the lesion.  [name] CARDIAC CATHETERIZATION  07/22/2003   Recommended CABG revascularization  . CARDIAC CATHETERIZATION  03/01/2006   Low, normal LV systolic function, normal pulmonary artery pressures, and patent grafts.  [name] CARDIAC CATHETERIZATION  02/19/2008   Continue current medications and treatment  plan.  [name] CARDIAC CATHETERIZATION  08/19/2009   Patent grafts with normal LV function  . CARDIAC CATHETERIZATION  11/25/2010   40% EF with anterior and distal inferior wall motion abnormalities. Patient grafts. Progression of diagonal and LAD disease.  [name] CARDIOVASCULAR STRESS TEST  09/30/2009   No scintigraphic evidence  of inducible myocardial ischemia. No ECG changes. EKG negative for ischemia.  . CORONARY ARTERY BYPASS GRAFT  07/23/2003   x3. LIMA to LAD, SVG to diagonal, SVG to RCA.  [name] LEFT HEART CATHETERIZATION WITH CORONARY/GRAFT ANGIOGRAM N/A 06/05/2014   Procedure: LEFT HEART CATHETERIZATION WITH Beatrix Fetters;  Surgeon: Leonie Man, MD;  Location: [facility] CATH LAB;  Service: Cardiovascular;  Laterality: N/A;  . LUMBAR [place]    . RENAL ARTERY ANGIOPLASTY Left 10/05/2009   95% renal artery stenosis. Predilated with a 4x15 Aviator balloon, stented with a 5x15 Genesis and Aviator balloon stent with dilatatin at 10 atm. Resulting in reduction of 95% to 0% with excellent flow.  [name] RENAL DOPPLER  10/23/2011   Bilateral Renal Artery - No evidence of significant diameter reduction, tortuosity, or any other vascular abnormality/  . SIMPLE MASTECTOMY  2000   left, in setting of breast cancer  . TOTAL ABDOMINAL HYSTERECTOMY    . TOTAL KNEE ARTHROPLASTY  12/2004, 12/2005   BL, right (2006) by Dr. Berenice Primas. Left (2007) by Dr. Ronnie Derby.  . TRANSTHORACIC ECHOCARDIOGRAM  03/13/2012   EF 50-55%, mild focal basal hypertrophy of the septum. Abnormal LV relaxation (grade 1 diastolic dysfunction).   HPI:  Pt is an 83 y.o. female with PMH of MI, past stroke, HTN, HLD, T2DM, hypothyroidism, degenerative joint disease, CAD w/ CABG, presenting to hospital on 3/22 with AMS. MRI showed Possible subcentimeter acute/early subacute infarctions in left thalamus and left parietal periventricular white matter. EEG showed intermittent frontal slowing- nonspecific. MD reported significant aphasia/ word salad, poor comprehension. Passed RN swallow screen however MD note says "SLP swallow eval- failed to participate yesterday". Cognitive linguistic eval ordered, requested RN to order bedside swallow.   Assessment / Plan / Recommendation Clinical Impression  Pt showed no overt s/s of aspiration during clinical evaluation. Pt did show  a prolonged oral phase with regular solid and needed assistance for feeding using left hand. Aspiration risk is increased given this and cognitive status. Recommend initiating dysphagia 3 diet/ thin liquids, meds whole in liquid, full supervision to assist with feeding. SLP will f/u for diet tolerance/ consider advancement, along with f/u for cognitive-linguistic skills. SLP Visit Diagnosis: Dysphagia, unspecified (R13.10)    Aspiration Risk  Mild aspiration risk    Diet Recommendation Dysphagia 3 (Mech soft);Thin liquid   Liquid Administration via: Cup;Straw Medication Administration: Whole meds with liquid Supervision: Staff to assist with self feeding;Full supervision/cueing for compensatory strategies Compensations: Slow rate;Small sips/bites Postural Changes: Seated upright at 90 degrees    Other  Recommendations Oral Care Recommendations: Oral care BID   Follow up Recommendations Skilled Nursing facility vs CIR     Frequency and Duration min 1 x/week  1 week(for dysphagia)       Prognosis Prognosis for Safe Diet Advancement: Good Barriers to Reach Goals: Cognitive deficits;Language deficits      Swallow Study   General HPI: Pt is an 83 y.o. female with PMH of MI, past stroke, HTN, HLD, T2DM, hypothyroidism, degenerative joint disease, CAD w/ CABG, presenting to hospital on 3/22 with AMS. MRI showed Possible subcentimeter acute/early subacute infarctions in left thalamus and left parietal periventricular white matter. EEG  showed intermittent frontal slowing- nonspecific. MD reported significant aphasia/ word salad, poor comprehension. Passed RN swallow screen however MD note says "SLP swallow eval- failed to participate yesterday". Cognitive linguistic eval ordered, requested RN to order bedside swallow. Type of Study: Bedside Swallow Evaluation Previous Swallow Assessment: none in chart Diet Prior to this Study: NPO Temperature Spikes Noted: No Respiratory Status: Room  air History of Recent Intubation: No Behavior/Cognition: Alert;Cooperative;Requires cueing;Doesn't follow directions Oral Cavity Assessment: Within Functional Limits Oral Care Completed by SLP: Yes Oral Cavity - Dentition: Adequate natural dentition Vision: Impaired for self-feeding Self-Feeding Abilities: Needs assist Patient Positioning: Upright in bed Baseline Vocal Quality: Normal Volitional Cough: Cognitively unable to elicit Volitional Swallow: Unable to elicit    Oral/Motor/Sensory Function Overall Oral Motor/Sensory Function: Other (comment)(difficult to assess)   Ice Chips Ice chips: Not tested   Thin Liquid Thin Liquid: Within functional limits Presentation: Cup;Straw    Nectar Thick Nectar Thick Liquid: Not tested   Honey Thick Honey Thick Liquid: Not tested   Puree Puree: Within functional limits Presentation: Spoon   Solid   GO   Solid: Impaired Presentation: Self Fed Oral Phase Impairments: Impaired mastication        Kern Reap, [place], CCC-SLP 02/11/2018,11:13 AM  [phone]

## 2018-02-11 NOTE — Clinical Social Work Note (Signed)
Pt's daughter and family have decided that pt will not go to SNF they will take her home with [facility]. CSW, pt's daughter, and RNCM spoke and pt has decided she wants [facility] or [facility]. Pt will need ST ordered. RNCM aware. Clinical Social Worker will sign off for now as social work intervention is no longer needed. Please consult [place] again if new need arises.    [place], [place]

## 2018-02-11 NOTE — Evaluation (Signed)
Speech Language Pathology Evaluation Patient Details Name: [name] MRN: [id number] DOB: [birth year] Today's Date: 02/11/2018 Time: 1030-1105 SLP Time Calculation (min) (ACUTE ONLY): 35 min  Problem List:  Patient Active Problem List   Diagnosis Date Noted  . Expressive aphasia   . Stroke ([place]) 02/09/2018  . Cardiomyopathy, ischemic- EF 45-50% at cath July 2015  06/27/2014  . Weakness 06/11/2014  . CAD S/P DES to Prox Cx & SVG-D1; PTCA of Anastomotic SVG-D1 lesion 06/05/2014  . NSTEMI (non-ST elevated myocardial infarction) ([place]) 06/04/2014  . Gout 03/13/2012  . History of breast cancer   . Chronic combined systolic and diastolic congestive heart failure, NYHA class 2 ([place]) 03/12/2012  . 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 Renal artery stenosis ([place]) 03/12/2012  . Essential hypertension 03/12/2012  . CVA (cerebral vascular accident) ([place]) 03/12/2012  . DM (diabetes mellitus), type 2, uncontrolled with complications ([place]) 71/21/9758  . Chronic kidney disease (CKD), stage II (mild) 03/12/2012  . Hypothyroidism- (TSH low, Synthroid held) 03/12/2012  . Hyperlipidemia with target LDL less than 70; and statin intolerant 03/12/2012  . Bradycardia 03/12/2012  . Syncope 03/12/2012  . History of DVT (deep vein thrombosis) 03/12/2012  . DJD (degenerative joint disease) of knee   . Hx of adenomatous colonic polyps    Past Medical History:  Past Medical History:  Diagnosis Date  . Atherosclerosis of autologous vein coronary artery bypass graft with unstable angina pectoris    CATH: 80% prox SVG-D1, 95% anastomotic SVG-D1 lesion  . CAD (coronary artery disease), native coronary artery 2004   a) 8/'32: Complicated PCI-of pLAD wtih PTCA  (failed redo PTCA) --> CABG 06/2003 for ISR LAD & D1 (LIMA-LAD, SVG-dRCA, SVG-D1);; b) 4/'07: Patent grafts, Normal RHC pressures; c) NSTEMI 9/'10: No culprit; 1/'12:  recanalized LAD stent, patent grafts  . CAD S/P percutaneous coronary angioplasty 06/05/2014   a) 1/04: BMS x 2 LAD, PTCA D1 followed by unusuccessful redo PTCA D1 --> initially turned down CABG initially then CABG in 06/2003;; b) 05/2014: DES to Prox Cx & SVG-D1; PTCA of Anastomotic SVG-D1 lesion  . 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 Grade 1 diastolic dysfunction  . Chronic combined systolic and diastolic congestive heart failure, NYHA class 2 07/2009   Mixed. 2D-echocardiogram (08/2010) - 35% to 40%. Diffuse hypokinesis. Grade 2 diastolic dysfunction.  . CKD (chronic kidney disease) stage 2, GFR 60-89 ml/min    BL Cr 1.6  . Dementia   . DJD (degenerative joint disease) of knee    left  . DM (diabetes mellitus), type 2, uncontrolled with complications DX: [id number]   Stopped of Metformin in 04/2011 in setting of AKI (Cr 3), now on insulin.  . Essential hypertension 03/12/2012  . History of breast cancer    Primary oncologist Dr. Brigitte Pulse. // First diagnosed 2000, recurrence in 06/2010. S/P left mastectomy with radiation therapy (2000), following recurrence, started chemothearpy, completed course (06/2010.)  . History of CVA (cerebrovascular accident) 11/2009; 8& 04/2009   left frontal lobe stroke (11/2009), TIA (06/2009, 04/2009) with multiple prior TIAs manifested by aphasia. H/o CVA in 1988 involving right eye.  [name] History of DVT of lower extremity 07/2003   in setting of saphenous vein grafting for CABG  . History of renal artery stenosis 2010  Left 95% stenosis s/p left renal artery stenting (09/2009), right renal artery with 50% stenosis.  [name] Hx of adenomatous colonic polyps    remote history. Last colonoscopy (03/2004) - Normal colonoscopy to the cecum, rec 5 year follow-up colonoscopy.  .  Hyperlipidemia with target LDL less than 70   . Hypothyroidism   . S/P CABG x 3 06/2003   LIMA-LAD, SVG-D1, SVG-dRCA   Past Surgical History:  Past Surgical History:  Procedure Laterality Date  . CARDIAC CATHETERIZATION  12/10/2002   Successful stenting of LAD with Cutting Balloon angioplasty of diagonal #1. There was a residual type B dissection in first diagonal branch.   . CARDIAC CATHETERIZATION  01/14/2003   Ostial 80% narrowing of the LAD - procedure was terminated after 5 unsuccessful attempts to pass the balloon into the stents and down to the lesion.  [name] CARDIAC CATHETERIZATION  07/22/2003   Recommended CABG revascularization  . CARDIAC CATHETERIZATION  03/01/2006   Low, normal LV systolic function, normal pulmonary artery pressures, and patent grafts.  [name] CARDIAC CATHETERIZATION  02/19/2008   Continue current medications and treatment plan.  [name] CARDIAC CATHETERIZATION  08/19/2009   Patent grafts with normal LV function  . CARDIAC CATHETERIZATION  11/25/2010   40% EF with anterior and distal inferior wall motion abnormalities. Patient grafts. Progression of diagonal and LAD disease.  [name] CARDIOVASCULAR STRESS TEST  09/30/2009   No scintigraphic evidence of inducible myocardial ischemia. No ECG changes. EKG negative for ischemia.  . CORONARY ARTERY BYPASS GRAFT  07/23/2003   x3. LIMA to LAD, SVG to diagonal, SVG to RCA.  [name] LEFT HEART CATHETERIZATION WITH CORONARY/GRAFT ANGIOGRAM N/A 06/05/2014   Procedure: LEFT HEART CATHETERIZATION WITH Beatrix Fetters;  Surgeon: Leonie Man, MD;  Location: [facility] CATH LAB;  Service: Cardiovascular;  Laterality: N/A;  . LUMBAR [place]    . RENAL ARTERY ANGIOPLASTY Left 10/05/2009   95% renal artery stenosis. Predilated with a 4x15 Aviator balloon, stented with a 5x15 Genesis and Aviator balloon stent with dilatatin at 10 atm. Resulting in reduction of 95% to 0% with excellent flow.  [name] RENAL DOPPLER  10/23/2011   Bilateral Renal Artery - No  evidence of significant diameter reduction, tortuosity, or any other vascular abnormality/  . SIMPLE MASTECTOMY  2000   left, in setting of breast cancer  . TOTAL ABDOMINAL HYSTERECTOMY    . TOTAL KNEE ARTHROPLASTY  12/2004, 12/2005   BL, right (2006) by Dr. Berenice Primas. Left (2007) by Dr. Ronnie Derby.  . TRANSTHORACIC ECHOCARDIOGRAM  03/13/2012   EF 50-55%, mild focal basal hypertrophy of the septum. Abnormal LV relaxation (grade 1 diastolic dysfunction).   HPI:  Pt is an 83 y.o. female with PMH of MI, past stroke, HTN, HLD, T2DM, hypothyroidism, degenerative joint disease, CAD w/ CABG, presenting to hospital on 3/22 with AMS. MRI showed Possible subcentimeter acute/early subacute infarctions in left thalamus and left parietal periventricular white matter. EEG showed intermittent frontal slowing- nonspecific. MD reported significant aphasia/ word salad, poor comprehension. Passed RN swallow screen however MD note says "SLP swallow eval- failed to participate yesterday". Cognitive linguistic eval ordered, requested RN to order bedside swallow.   Assessment / Plan / Recommendation Clinical Impression  Pt currently presenting with a Wernicke's aphasia with associated severe receptive and expressive language deficits. Pt with significant difficulty following basic 1-step commands despite modeling/ visual and tactile cues. Identified objects from a field of 2 with 25% accuracy, although visual attention and some right side neglect seemed to impact this  task. Tendency to answer "yes" to all yes/ no questions. Expressively, pt was able to count   to 10 in unison with SLP ~50% accuracy, unable to state name despite max cues due to perseveration. Connected speech is fluent, characterized by short phrases/ sentences consisting of jargon/ perseverations. Pt will benefit from continued speech therapy while in acute care and at next level of care as she is having significant difficulty expressing wants/ needs and following  directions to complete ADLs.     SLP Assessment  SLP Recommendation/Assessment: Patient needs continued Speech Lanaguage Pathology Services SLP Visit Diagnosis: Aphasia (R47.01)    Follow Up Recommendations  Inpatient Rehab;Skilled Nursing facility    Frequency and Duration min 3x week  2 weeks      SLP Evaluation Cognition  Overall Cognitive Status: Impaired/Different from baseline Arousal/Alertness: Lethargic Orientation Level: Other (comment)(difficult to assess d/t aphasia) Attention: Selective Selective Attention: Impaired Selective Attention Impairment: Verbal basic;Functional basic Memory: (difficult to assess d/t aphasia) Awareness: Impaired Awareness Impairment: Emergent impairment;Anticipatory impairment       Comprehension  Auditory Comprehension Overall Auditory Comprehension: Impaired Yes/No Questions: Impaired Basic Biographical Questions: 51-75% accurate Commands: Impaired One Step Basic Commands: 0-24% accurate EffectiveTechniques: Repetition;Visual/Gestural cues Reading Comprehension Reading Status: Not tested    Expression Expression Primary Mode of Expression: Verbal Verbal Expression Overall Verbal Expression: Impaired Initiation: No impairment Automatic Speech: Counting Level of Generative/Spontaneous Verbalization: Word Repetition: Impaired Level of Impairment: Word level Naming: Impairment Responsive: 0-25% accurate Confrontation: Impaired Verbal Errors: Neologisms;Perseveration;Jargon;Not aware of errors Pragmatics: No impairment   Oral / Motor  Oral Motor/Sensory Function Overall Oral Motor/Sensory Function: Other (comment)(difficult to assess) Motor Speech Overall Motor Speech: Appears within functional limits for tasks assessed   [place], [place], CCC-SLP 02/11/2018, 11:23 AM  [phone]

## 2018-02-11 NOTE — Progress Notes (Addendum)
Family Medicine Teaching Service Daily Progress Note Intern Pager: [phone]  Patient name: [name] Medical record number: [id number] Date of birth: 04-17-1935 Age: 83 y.o. Gender: female  Primary Care Provider: Cyndi Bender, PA-C Consultants: neuro Code Status: full (advanced directive names HPOAs and declines life sustaining measures if in permanent coma or vegetative state)  Pt Overview and Major Events to Date:  [name] is a 83 y.o. female presenting with AMS. PMH is significant for MI, past stroke, HTN, HLD, T2DM, hypothyroidism, degenerative joint disease, CAD w/ CABG  Assessment and Plan:  Aphasia and facial droop in the setting of CVA:  Outside TPA time window on presentation. CT was neg for intracranial hemorrhage x2. Patient continued to have significant aphasia/word salad on our exam and neuro recommended admission for stroke workup. -Neurology following, appreciate recommendations -aspirin 325 -anticoagulation per neuro recs pending (not candidate for plavix/brilinta) -SLP swallow eval- failed to be able to participate yesterday -NPO  -IVF D5 1/2NS at 19ml/hr  -PT/OT recommend SNF - Patient unable to have full MRI obtained yesterday due to movement, even with ativan administration. Neuro ordered repeat CT without which shows: .1 Mild hypoattenuation in left periatrial white matter, corresponding to area of possible diffusion abnormality on recent MRI. This appears slightly more prominent than on most recent head CT and could indicate expected evolution of subacute ischemia. 2. No hemorrhage. 3. Old bilateral frontal lobe and left occipital lobe infarcts. - LE venous doppler pending  New EF of 25%- HFpEF down from 45-50%: Chronic combined systolic and diastolic congestive heart failure. Last prior EF 40-45% in 2015.  No signs of volume overload on exam, will continue to monitor volume status. -cardiology consulted- consider Zetia, continue ASA -stopped BB due  to bradycardia -started lisinopril -TEE pending  UTI: Large leuks/positive nitrites on UA.  {Patient cannot reliably communicate symptoms).   --Ceftriaxone (3/22-24) --Urine culture> multiple species present, recommend repeat- will hold off as it will likely not grow anything due to abx tx  T2DM: Per daughter on home 80 lantus and metformin.  Last glucose 160,145.  Patient will need careful glycemic control while inpatient, now on D5 1/2NS. --Will start with SSI TID given NPO status --We will add long-acting as patient resumes diet for optimal control --hold metformin  CAD: Contributing risk factor to CVA.  Does not tolerate statins however will likely benefit from aspirin given multiple risk factors and significant history with multiple CVA. -Follow Neurology recommendations  DJD: chronic, can ambulate independently at baseline -no acute intervention needed  HLD: chronic. Patient does not tolerate statins -neuro recs - May consider Zetia  Hypothyroidism: chronic, on home 100 synthroid.  TSH slightly low. -reduce to 75 synthroid  CAD s/p CABG x3: Patient unable to communicate verbally. Trop negx3 -will monitor clinically  FEN/GI: npo will reassess if patient passes swallow screen (now on D5 1/2NS) Prophylaxis: SCDs   Husband is HPOA> [name] [phone] (SNF/Code status?)  Disposition: continued inpatient stay  Subjective:  Patient able to shake head no that she is not in pain. She has some words that do not make sense, but she is also able to say ok to how is she feeling. She says no to are you hungry.   Objective: Temp:  [97.6 F (36.4 C)-98.4 F (36.9 C)] 97.8 F (36.6 C) (03/24 0813) Pulse Rate:  [64-78] 70 (03/24 0813) Resp:  [16-18] 16 (03/24 0813) BP: (134-164)/(57-83) 157/78 (03/24 0813) SpO2:  [94 %-100 %] 100 % (03/24 0813) Weight:  [  131 lb 13.4 oz (59.8 kg)] 131 lb 13.4 oz (59.8 kg) (03/24 0413) Physical Exam: General: NAD, pleasant ENTM:  Moist mucous membranes Cardiovascular: RRR, 3/6 murmur, no LE edema Respiratory: CTA BL, normal work of breathing Gastrointestinal: soft, nontender, nondistended MSK: moves 4 extremities equally Derm: no rashes appreciated Neuro: awake, alert, eyes open, able to answer yes and no, but has other insensible words. Right facial droop. RUE and RLE with 4/5 strength.  Laboratory: Recent Labs  Lab 02/09/18 1306 02/09/18 1323  WBC 7.6  --   HGB 13.7 13.3  HCT 40.0 39.0  PLT 156  --    Recent Labs  Lab 02/09/18 1306 02/09/18 1323 02/11/18 0527  NA 139 143 142  K 3.8 3.9 3.7  CL 107 106 107  CO2 23  --  21*  BUN 14 15 10   CREATININE 1.09* 1.00 0.98  CALCIUM 8.9  --  9.1  PROT 6.6  --   --   BILITOT 0.6  --   --   ALKPHOS 82  --   --   ALT 19  --   --   AST 25  --   --   GLUCOSE 163* 159* 129*   Lipid Panel     Component Value Date/Time   CHOL 193 02/10/2018 0428   TRIG 125 02/10/2018 0428   HDL 37 (L) 02/10/2018 0428   CHOLHDL 5.2 02/10/2018 0428   VLDL 25 02/10/2018 0428   LDLCALC 131 (H) 02/10/2018 0428    A1C 8.6 TSH 0.317  Imaging/Diagnostic Tests: Ct Head Wo Contrast  Result Date: 02/11/2018 CLINICAL DATA:  Stroke follow-up EXAM: CT HEAD WITHOUT CONTRAST TECHNIQUE: Contiguous axial images were obtained from the base of the skull through the vertex without intravenous contrast. COMPARISON:  Head CT 08/04/2015, 02/09/2018 Brain MRI 02/10/2018 FINDINGS: Brain: No mass lesion, intraparenchymal hemorrhage or extra-axial collection. No evidence of acute cortical infarct. Old bilateral frontal lobe and left occipital lobe infarcts. Mild hypoattenuation in the left periatrial white matter, corresponding to possible diffusion abnormality. Vascular: No hyperdense vessel or unexpected vascular calcification. Skull: Normal visualized skull base, calvarium and extracranial soft tissues. Sinuses/Orbits: Complete opacification of the left sphenoid sinus. Normal orbits. IMPRESSION:  1. Mild hypoattenuation in the left periatrial white matter, corresponding to the area of possible diffusion abnormality on the recent MRI. This appears slightly more prominent than on the most recent head CT and could indicate expected evolution of subacute ischemia. 2. No hemorrhage. 3. Old bilateral frontal lobe and left occipital lobe infarcts. Electronically Signed   By: Ulyses Jarred M.D.   On: 02/11/2018 05:26   Mr Brain Wo Contrast  Result Date: 02/10/2018 CLINICAL DATA:  83 y/o  F; slurred speech, suspected stroke. EXAM: MRI HEAD WITHOUT CONTRAST TECHNIQUE: Axial DWI and coronal DWI sequences were acquired. Patient was unable to continue the examination. COMPARISON:  02/09/2018 CT of the head and CTA of the head. FINDINGS: Severe motion artifact of diffusion weighted sequences. Possible subcentimeter foci of reduced diffusion in left thalamus and left parietal periventricular white matter which may represent small areas of acute/early subacute infarction. IMPRESSION: 1. Severe motion artifact.  Only DWI sequences acquired. 2. Possible subcentimeter acute/early subacute infarctions in left thalamus and left parietal periventricular white matter. Electronically Signed   By: Kristine Garbe M.D.   On: 02/10/2018 18:06   Hakim [place], [place], DO 02/11/2018, 8:35 AM PGY-1, [place] Intern pager: [phone], text pages welcome

## 2018-02-11 NOTE — Clinical Social Work Note (Signed)
Clinical Social Work Assessment  Patient Details  Name: [name] MRN: [id number] Date of Birth: [birth year]  Date of referral:  02/11/18               Reason for consult:  Facility Placement                Permission sought to share information with:  Family Supports Permission granted to share information::     Name::     Best boy::     Relationship::  Daughter  Contact Information:     Housing/Transportation Living arrangements for the past 2 months:  [place] of Information:  Adult Children Patient Interpreter Needed:  None Criminal Activity/Legal Involvement Pertinent to Current Situation/Hospitalization:  No - Comment as needed Significant Relationships:  Adult Children Lives with:  Self Do you feel safe going back to the place where you live?  No Need for family participation in patient care:  No (Coment)  Care giving concerns:  CSW spoke with pt's daughter via telephone.    Social Worker assessment / plan:  Pt's daughter states she is not sure she wants her mom to go to SNF. She states "I made a promise." Pt's daughter then got really upset. Pt's daughter was asking for advice from CSW. Pt's daughter then asked CSW if ST could be ordered for home. Pt's daughter doesn't know what she is going to do about d/c. Pt requesting CSW meet with her when she gets to hospital today. CSW to follow up.   Employment status:  Retired Nurse, adult PT Recommendations:  [place] / Referral to community resources:  [place]  Patient/Family's Response to care:  Pt verbalized understanding of CSW role and expressed appreciation for support. Pt's daughter had many concerns regarding pt care at this time.   Patient/Family's Understanding of and Emotional Response to Diagnosis, Current Treatment, and Prognosis:  Pt's daughter understanding and realistic regarding pt's physical limitations.  Pt understand however has not agreed for pt to go to SNF at d/c.  CSW to follow up with daughter regarding d/c plan. CSW will continue to provide support and facilitate d/c needs.   Emotional Assessment Appearance:  Appears stated age Attitude/Demeanor/Rapport:  Unable to Assess Affect (typically observed):  Unable to Assess Orientation:  Oriented to Self Alcohol / Substance use:  Not Applicable Psych involvement (Current and /or in the community):  No (Comment)  Discharge Needs  Concerns to be addressed:  Basic Needs, Care Coordination Readmission within the last 30 days:  No Current discharge risk:  Dependent with Mobility Barriers to Discharge:  Continued Medical Work up   [employer], LCSW 02/11/2018, 1:52 PM

## 2018-02-11 NOTE — Care Management Note (Signed)
Case Management Note  Patient Details  Name: ROSALINE EZEKIEL MRN: [id number] Date of Birth: 02-01-1935  Subjective/Objective:      Pt presented from SNF for CVA.  CM contacted by Shawna Orleans, CSW as patient's daughter has decided to take her home instead of going to SNF.   Spoke on the phone with Shawna Orleans and pt's daughter, [name], who is very concerned that pt will be able to receive [facility] SLP if she goes home.  Discussed HH options with [place] including choice of agencies.  [name] would like to pursue [facility] as a first choice and [facility] as a 2nd choice.         Action/Plan: D/W Alwyn Ren, liaison for [facility].  [facility] can accept referral for anticipated needs including ST and PT and will follow for other needs as no HH orders are written at this time.  Left VM on [name]'s cell phone that [facility] can provide [facility] services.    Expected Discharge Date:                  Expected Discharge Plan:  [place]  In-House Referral:  NA  Discharge planning Services  CM Consult  Post Acute Care Choice:  Home Health Choice offered to:  Adult Children  DME Arranged:    DME Agency:     HH Arranged:    HH Agency:  [facility] [employer]  Status of Service:  In process, will continue to follow  If discussed at Long Length of Stay Meetings, dates discussed:    Additional Comments:  Claudie Leach, RN 02/11/2018, 5:19 PM

## 2018-02-11 NOTE — Progress Notes (Signed)
STROKE TEAM PROGRESS NOTE   SUBJECTIVE (INTERVAL HISTORY) Her daughter-in-law and husband are at the bedside. Also talked with daughter over the phone. Pt seems to have baseline dementia at home with memory issue but able to do all her ADLs and able to have normal conversation. CT repeat showed left MCA and WM small infarcts. Patient still has global aphasia, but able to say "thank you" and "bye-bye" now. Still has left gaze preference, and right mild hemiparesis.  Cardiology on board, put on lisinopril.    OBJECTIVE Temp:  [97.6 F (36.4 C)-98.6 F (37 C)] 97.6 F (36.4 C) (03/24 0413) Pulse Rate:  [55-78] 64 (03/24 0413) Cardiac Rhythm: Normal sinus rhythm;Bundle branch block (03/24 0700) Resp:  [17-18] 18 (03/24 0413) BP: (134-164)/(57-83) 134/83 (03/24 0413) SpO2:  [94 %-100 %] 97 % (03/24 0413) Weight:  [131 lb 13.4 oz (59.8 kg)] 131 lb 13.4 oz (59.8 kg) (03/24 0413)  CBC:  Recent Labs  Lab 02/09/18 1306 02/09/18 1323  WBC 7.6  --   NEUTROABS 4.9  --   HGB 13.7 13.3  HCT 40.0 39.0  MCV 93.9  --   PLT 156  --     Basic Metabolic Panel:  Recent Labs  Lab 02/09/18 1306 02/09/18 1323 02/11/18 0527  NA 139 143 142  K 3.8 3.9 3.7  CL 107 106 107  CO2 23  --  21*  GLUCOSE 163* 159* 129*  BUN 14 15 10   CREATININE 1.09* 1.00 0.98  CALCIUM 8.9  --  9.1    Lipid Panel:     Component Value Date/Time   CHOL 193 02/10/2018 0428   TRIG 125 02/10/2018 0428   HDL 37 (L) 02/10/2018 0428   CHOLHDL 5.2 02/10/2018 0428   VLDL 25 02/10/2018 0428   LDLCALC 131 (H) 02/10/2018 0428   HgbA1c:  Lab Results  Component Value Date   HGBA1C 8.6 (H) 02/10/2018   Urine Drug Screen:     Component Value Date/Time   LABOPIA NONE DETECTED 02/09/2018 1433   COCAINSCRNUR NONE DETECTED 02/09/2018 1433   LABBENZ NONE DETECTED 02/09/2018 1433   AMPHETMU NONE DETECTED 02/09/2018 1433   THCU NONE DETECTED 02/09/2018 1433   LABBARB NONE DETECTED 02/09/2018 1433    Alcohol Level      Component Value Date/Time   ETH <10 02/09/2018 1306    IMAGING I have personally reviewed the radiological images below and agree with the radiology interpretations.  Ct Angio Head W Or Wo Contrast Ct Angio Neck W Or Wo Contrast 02/10/2018 IMPRESSION:  1. Negative CTA for large vessel occlusion.  2. Atheromatous stenoses of approximately 60% about the carotid bifurcations/proximal ICAs bilaterally.  3. Attenuation of the distal left MCA branches, most likely related to chronic left MCA territory infarct.  4. Moderate to severe left V1 and V2 stenoses as above.  5. Advanced intracranial atherosclerotic disease throughout the remaining intracranial circulation, most notable within the carotid siphons, right worse than left.   Dg Chest 2 View 02/09/2018 IMPRESSION:  No active cardiopulmonary disease.   Ct Head Wo Contrast 02/09/2018 IMPRESSION:  No acute abnormality. Atrophy, chronic microvascular ischemic change remote bilateral frontal infarcts. Atherosclerosis. Chronic left sphenoid sinus disease.   Ct Head Wo Contrast  Result Date: 02/11/2018 CLINICAL DATA:  Stroke follow-up EXAM: CT HEAD WITHOUT CONTRAST TECHNIQUE: Contiguous axial images were obtained from the base of the skull through the vertex without intravenous contrast. COMPARISON:  Head CT 08/04/2015, 02/09/2018 Brain MRI 02/10/2018 FINDINGS: Brain: No mass lesion,  intraparenchymal hemorrhage or extra-axial collection. No evidence of acute cortical infarct. Old bilateral frontal lobe and left occipital lobe infarcts. Mild hypoattenuation in the left periatrial white matter, corresponding to possible diffusion abnormality. Vascular: No hyperdense vessel or unexpected vascular calcification. Skull: Normal visualized skull base, calvarium and extracranial soft tissues. Sinuses/Orbits: Complete opacification of the left sphenoid sinus. Normal orbits. IMPRESSION: 1. Mild hypoattenuation in the left periatrial white matter,  corresponding to the area of possible diffusion abnormality on the recent MRI. This appears slightly more prominent than on the most recent head CT and could indicate expected evolution of subacute ischemia. 2. No hemorrhage. 3. Old bilateral frontal lobe and left occipital lobe infarcts. Electronically Signed   By: Ulyses Jarred M.D.   On: 02/11/2018 05:26   Mr Brain Wo Contrast  Result Date: 02/10/2018 CLINICAL DATA:  83 y/o  F; slurred speech, suspected stroke. EXAM: MRI HEAD WITHOUT CONTRAST TECHNIQUE: Axial DWI and coronal DWI sequences were acquired. Patient was unable to continue the examination. COMPARISON:  02/09/2018 CT of the head and CTA of the head. FINDINGS: Severe motion artifact of diffusion weighted sequences. Possible subcentimeter foci of reduced diffusion in left thalamus and left parietal periventricular white matter which may represent small areas of acute/early subacute infarction. IMPRESSION: 1. Severe motion artifact.  Only DWI sequences acquired. 2. Possible subcentimeter acute/early subacute infarctions in left thalamus and left parietal periventricular white matter. Electronically Signed   By: Kristine Garbe M.D.   On: 02/10/2018 18:06   LE venous doppler pending  TEE pending  EEG This is an abnormal EEG due to intermittent frontal slowing (FIRDA).   FIRDA is a non-specific finding that can be seen with toxic, metabolic, diffuse, or multifocal structural processes.  No definite epileptiform changes were noted.   A single EEG without epileptiform changes does not exclude the diagnosis of epilepsy. Clinical correlation advised.  Transthoracic Echocardiogram - Left ventricle: Septal and apical akinesis inferior wall   hypokinesis. The cavity size was severely dilated. Wall thickness   was normal. The estimated ejection fraction was 25%. Doppler   parameters are consistent with both elevated ventricular   end-diastolic filling pressure and elevated left atrial  filling   pressure. - Aortic valve: Given morphology and decreased EF AS likely more in   moderae range despite low gradient. There was mild stenosis.   There was mild regurgitation. - Mitral valve: Calcified annulus. Mildly thickened leaflets . - Atrial septum: No defect or patent foramen ovale was identified.   PHYSICAL EXAM Temp:  [97.6 F (36.4 C)-98.6 F (37 C)] 97.6 F (36.4 C) (03/24 0413) Pulse Rate:  [55-78] 64 (03/24 0413) Resp:  [17-18] 18 (03/24 0413) BP: (134-164)/(57-83) 134/83 (03/24 0413) SpO2:  [94 %-100 %] 97 % (03/24 0413) Weight:  [131 lb 13.4 oz (59.8 kg)] 131 lb 13.4 oz (59.8 kg) (03/24 0413)  General - Well nourished, well developed, in no apparent distress.  Ophthalmologic - Fundi not visualized due to noncooperation.  Cardiovascular - Regular rate and rhythm.  Neuro - awake, alert, eyes open, not following commands, word salad and expressive aphasia but able to say "thank you" and "bye-bye" spontaneously, not able to name or repeat.  Left gaze preference, barely cross midline, right neglect, not blinking to visual threat on the right.  Right facial droop, tongue midline, right upper extremity and lower extremity 4/5 with drift, RUE and RLE normal strength without drift. Sensation, coordination and gait not tested.   ASSESSMENT/PLAN Ms. ROMILDA PROBY is  a 83 y.o. female with history of coronary artery disease, hypothyroidism, hyperlipidemia, renal artery stenosis, previous DVT, history of breast cancer, diabetes mellitus, dementia, previous strokes, chronic kidney disease, and cardiomyopathy presenting with aphasia and right-sided weakness. She did not receive IV t-PA due to late presentation.  Stroke - left MCA small frontoparietal infarct - embolic pattern, likely due to low EF. Undiagnosed afib not excluded.   Resultant global aphasia, right hemiparesis, right facial droop, right neglect, left gaze  CT head - No acute abnormality. Remote bilateral  frontal infarcts  MRI head - not cooperative  CT repeat - left frontoparietal small infarcts  EEG - FIRDA but no seizure  CTA H&N - bilateral ICA proximal 60% stenosis, left M2 stenosis  2D Echo - EF 25%, down from 40-45% in the past  TEE pending  LE venous Doppler pending  LDL - 131  HgbA1c - 8.6  VTE prophylaxis - SCDs Fall precautions Diet NPO time specified  aspirin 81 mg daily and clopidogrel 75 mg daily prior to admission, now on aspirin 300 mg suppository daily.   Patient counseled to be compliant with her antithrombotic medications  Ongoing aggressive stroke risk factor management  Therapy recommendations:  SNF recommended  Disposition:  Pending  Cardiomyopathy with low EF  TTE showed EF 25%  08/2014 - EF 40-45%  Follows with Dr. Ellyn Hack in cardiology  Likely the source of embolic stroke  Cardiology on board, on lisinopril   TEE pending  Recommend anticoagulation in 3 days (02/13/18), preferably DOAC, for stroke prevention. Repeat TTE in 2-3 months, once EF >= 35%, anticoagulation can be discontinued  Recommend outpt 30 day cardiac event monitoring to rule out underlying afib as outpt   History of TIA/stroke  TIA in 04/2009 and 06/2009  1988 CVA- right eye vision difficulty  2011 left frontal infarct  04/2011 -right-sided weakness and aphasia - CT showed old left frontal infarct - MRI showed a left PLIC/thalamic infarct  Baseline mild dementia  Short-term memory deficit  But able to perform all ADLs  Still driving locally   Able to carry normal conversation  Need outpt follow up with neurology for monitoring  UTI  UA WBC TNTC  On Rocephin  Hypertension  Stable  On lisinopril   Long-term BP goal normotensive  Hyperlipidemia  Lipid lowering medication PTA: none  LDL 131, goal < 70  Current lipid lowering medication: zetia 10  History of statin intolerance.   Diabetes  HgbA1c 8.6, goal <  7.0  Uncontrolled  SSI  CBG monitoring  Other Stroke Risk Factors  Advanced age  Coronary artery disease status post CABG and stenting  Left renal artery stenosis status post stent in 2010  Other Active Problems  Plavix nonresponder. Brilinta and statin intolerance.  Breast cancer status post chemotherapy, radiation and surgery in 2015   Hospital day # 2  Rosalin Hawking, MD PhD Stroke Neurology 02/11/2018 11:48 AM   To contact Stroke Continuity provider, please refer to [email or web address] After hours, contact General Neurology

## 2018-02-12 ENCOUNTER — Encounter (HOSPITAL_COMMUNITY): Payer: Self-pay | Admitting: *Deleted

## 2018-02-12 ENCOUNTER — Inpatient Hospital Stay (HOSPITAL_COMMUNITY): Payer: Medicare Other

## 2018-02-12 DIAGNOSIS — Z8673 Personal history of transient ischemic attack (TIA), and cerebral infarction without residual deficits: Secondary | ICD-10-CM

## 2018-02-12 DIAGNOSIS — I63412 Cerebral infarction due to embolism of left middle cerebral artery: Principal | ICD-10-CM

## 2018-02-12 DIAGNOSIS — R4701 Aphasia: Secondary | ICD-10-CM

## 2018-02-12 DIAGNOSIS — I2510 Atherosclerotic heart disease of native coronary artery without angina pectoris: Secondary | ICD-10-CM

## 2018-02-12 DIAGNOSIS — N179 Acute kidney failure, unspecified: Secondary | ICD-10-CM

## 2018-02-12 DIAGNOSIS — E119 Type 2 diabetes mellitus without complications: Secondary | ICD-10-CM

## 2018-02-12 DIAGNOSIS — R451 Restlessness and agitation: Secondary | ICD-10-CM

## 2018-02-12 DIAGNOSIS — E1165 Type 2 diabetes mellitus with hyperglycemia: Secondary | ICD-10-CM

## 2018-02-12 DIAGNOSIS — I639 Cerebral infarction, unspecified: Secondary | ICD-10-CM

## 2018-02-12 LAB — GLUCOSE, CAPILLARY
GLUCOSE-CAPILLARY: 325 mg/dL — AB (ref 65–99)
Glucose-Capillary: 168 mg/dL — ABNORMAL HIGH (ref 65–99)
Glucose-Capillary: 266 mg/dL — ABNORMAL HIGH (ref 65–99)
Glucose-Capillary: 185 mg/dL — ABNORMAL HIGH (ref 65–99)
Glucose-Capillary: 195 mg/dL — ABNORMAL HIGH (ref 65–99)

## 2018-02-12 LAB — BASIC METABOLIC PANEL
ANION GAP: 11 (ref 5–15)
BUN: 12 mg/dL (ref 6–20)
CO2: 22 mmol/L (ref 22–32)
Calcium: 8.9 mg/dL (ref 8.9–10.3)
Chloride: 105 mmol/L (ref 101–111)
Creatinine, Ser: 1.25 mg/dL — ABNORMAL HIGH (ref 0.44–1.00)
GFR calc Af Amer: 46 mL/min — ABNORMAL LOW (ref >60)
GFR, EST NON AFRICAN AMERICAN: 40 mL/min — AB (ref >60)
Glucose, Bld: 322 mg/dL — ABNORMAL HIGH (ref 65–99)
POTASSIUM: 3.7 mmol/L (ref 3.5–5.1)
SODIUM: 138 mmol/L (ref 135–145)

## 2018-02-12 LAB — CBC
HEMATOCRIT: 38.3 % (ref 36.0–46.0)
HEMOGLOBIN: 13.2 g/dL (ref 12.0–15.0)
MCH: 32.2 pg (ref 26.0–34.0)
MCHC: 34.5 g/dL (ref 30.0–36.0)
MCV: 93.4 fL (ref 78.0–100.0)
Platelets: 157 10*3/uL (ref 150–400)
RBC: 4.1 MIL/uL (ref 3.87–5.11)
RDW: 13.2 % (ref 11.5–15.5)
WBC: 6.4 10*3/uL (ref 4.0–10.5)

## 2018-02-12 MED ORDER — HALOPERIDOL LACTATE 5 MG/ML IJ SOLN
1.0000 mg | Freq: Once | INTRAMUSCULAR | Status: AC | PRN
  Administered 2018-02-12: 1 mg via INTRAVENOUS
  Filled 2018-02-12: qty 1

## 2018-02-12 NOTE — Consult Note (Signed)
Physical Medicine and Rehabilitation Consult  Reason for Consult:Stroke with functional deficits.  Referring Physician: Dr. Erlinda Hong   HPI: [name] is a 83 y.o. female with history of CAD, chemo induced CM, prior stroke, dementia who was admitted on 02/09/18 with confusion and right sided weakness. CT head reviewed, unremarkable for acute intracranial process. Per report, MRI brain showing limited by motion artifact but showed possible acute early subacute infarcts in left thalamus and left parietal periventricular white matter. 2D echo showed EF 25% with septal,  Mild AS and apical akinesis of inferior wall. EEG done due to ongoing issues with confusion and agitation a and showed intermittent frontal slowing. Dr. Erlinda Hong recommends DOAC starting 7/90 due to embolic stroke --to repeat echo in 2-3 months and anticoagulation can be d/ced if EF > 35%. She was treated with IV antibiotics X 3 days due to concerns of UTI.  Patient with resultant global aphasia, bouts of lethargy with agitation, left gaze preference, right sided weakness affecting mobility and self care tasks. She is showing improvement in mentation and CIR recommended for follow up therapy.   Review of Systems  Unable to perform ROS: Mental acuity   Past Medical History:  Diagnosis Date  . Atherosclerosis of autologous vein coronary artery bypass graft with unstable angina pectoris ([place])    CATH: 80% prox SVG-D1, 95% anastomotic SVG-D1 lesion  . CAD (coronary artery disease), native coronary artery 2004   a) 2/'40: Complicated PCI-of pLAD wtih PTCA  (failed redo PTCA) --> CABG 06/2003 for ISR LAD & D1 (LIMA-LAD, SVG-dRCA, SVG-D1);; b) 4/'07: Patent grafts, Normal RHC pressures; c) NSTEMI 9/'10: No culprit; 1/'12: recanalized LAD stent, patent grafts  . CAD S/P percutaneous coronary angioplasty 06/05/2014   a) 1/04: BMS x 2 LAD, PTCA D1 followed by unusuccessful redo PTCA D1 --> initially turned down CABG initially then CABG in  06/2003;; b) 05/2014: DES to Prox Cx & SVG-D1; PTCA of Anastomotic SVG-D1 lesion  . Cardiomyopathy secondary to chemotherapy ([place]) 07/2009   a) Combined Ischemic & Non-ischemic (Adriamycin from [facility])EF ~40% by Cath; by Echo in 08/2010 35-40%;; b) followup echo August 2013: EF 50-55%, aortic sclerosis without stenosis. Grade 1 diastolic dysfunction  . Chronic combined systolic and diastolic congestive heart failure, NYHA class 2 ([place]) 07/2009   Mixed. 2D-echocardiogram (08/2010) - 35% to 40%. Diffuse hypokinesis. Grade 2 diastolic dysfunction.  . CKD (chronic kidney disease) stage 2, GFR 60-89 ml/min    BL Cr 1.6  . Dementia   . DJD (degenerative joint disease) of knee    left  . DM (diabetes mellitus), type 2, uncontrolled with complications ([place]) DX: 9735   Stopped of Metformin in 04/2011 in setting of AKI (Cr 3), now on insulin.  . Essential hypertension 03/12/2012  . History of breast cancer    Primary oncologist Dr. Brigitte Pulse. // First diagnosed 2000, recurrence in 06/2010. S/P left mastectomy with radiation therapy (2000), following recurrence, started chemothearpy, completed course (06/2010.)  . History of CVA (cerebrovascular accident) 11/2009; 8& 04/2009   left frontal lobe stroke (11/2009), TIA (06/2009, 04/2009) with multiple prior TIAs manifested by aphasia. H/o CVA in 1988 involving right eye.  [name] History of DVT of lower extremity 07/2003   in setting of saphenous vein grafting for CABG  . History of renal artery stenosis 2010   Left 95% stenosis s/p left renal artery stenting (09/2009), right renal artery with 50% stenosis.  [name] Hx of adenomatous colonic polyps  remote history. Last colonoscopy (03/2004) - Normal colonoscopy to the cecum, rec 5 year follow-up colonoscopy.  . Hyperlipidemia with target LDL less than 70   . Hypothyroidism   . S/P CABG x 3 06/2003   LIMA-LAD, SVG-D1, SVG-dRCA    Past Surgical History:  Procedure Laterality Date  . CARDIAC CATHETERIZATION   12/10/2002   Successful stenting of LAD with Cutting Balloon angioplasty of diagonal #1. There was a residual type B dissection in first diagonal branch.   . CARDIAC CATHETERIZATION  01/14/2003   Ostial 80% narrowing of the LAD - procedure was terminated after 5 unsuccessful attempts to pass the balloon into the stents and down to the lesion.  [name] CARDIAC CATHETERIZATION  07/22/2003   Recommended CABG revascularization  . CARDIAC CATHETERIZATION  03/01/2006   Low, normal LV systolic function, normal pulmonary artery pressures, and patent grafts.  [name] CARDIAC CATHETERIZATION  02/19/2008   Continue current medications and treatment plan.  [name] CARDIAC CATHETERIZATION  08/19/2009   Patent grafts with normal LV function  . CARDIAC CATHETERIZATION  11/25/2010   40% EF with anterior and distal inferior wall motion abnormalities. Patient grafts. Progression of diagonal and LAD disease.  [name] CARDIOVASCULAR STRESS TEST  09/30/2009   No scintigraphic evidence of inducible myocardial ischemia. No ECG changes. EKG negative for ischemia.  . CORONARY ARTERY BYPASS GRAFT  07/23/2003   x3. LIMA to LAD, SVG to diagonal, SVG to RCA.  [name] LEFT HEART CATHETERIZATION WITH CORONARY/GRAFT ANGIOGRAM N/A 06/05/2014   Procedure: LEFT HEART CATHETERIZATION WITH Beatrix Fetters;  Surgeon: Leonie Man, MD;  Location: [facility] CATH LAB;  Service: Cardiovascular;  Laterality: N/A;  . LUMBAR [place]    . RENAL ARTERY ANGIOPLASTY Left 10/05/2009   95% renal artery stenosis. Predilated with a 4x15 Aviator balloon, stented with a 5x15 Genesis and Aviator balloon stent with dilatatin at 10 atm. Resulting in reduction of 95% to 0% with excellent flow.  [name] RENAL DOPPLER  10/23/2011   Bilateral Renal Artery - No evidence of significant diameter reduction, tortuosity, or any other vascular abnormality/  . SIMPLE MASTECTOMY  2000   left, in setting of breast cancer  . TOTAL ABDOMINAL HYSTERECTOMY    . TOTAL KNEE ARTHROPLASTY  12/2004,  12/2005   BL, right (2006) by Dr. Berenice Primas. Left (2007) by Dr. Ronnie Derby.  . TRANSTHORACIC ECHOCARDIOGRAM  03/13/2012   EF 50-55%, mild focal basal hypertrophy of the septum. Abnormal LV relaxation (grade 1 diastolic dysfunction).    Family History  Problem Relation Age of Onset  . Heart disease Mother   . Diabetes Mother   . Heart disease Father   . Cancer Father        colon cancer  . Heart disease Sister   . Heart disease Brother   . Cancer Brother        prostate cancer  . Heart disease Brother   . Cancer Sister        breast cancer    Social History:  Married. Lives with husband (frail per reports). [place]. A neighbor checks in daily?  Per reports  she has never smoked. She has never used smokeless tobacco. Per reports  she does not drink alcohol or use drugs.    Allergies  Allergen Reactions  . Brilinta [Ticagrelor] Shortness Of Breath  . Plavix [Clopidogrel Bisulfate] Other (See Comments)    NOT A RESPONDER- P2Y12 ASSAY WAS 224 (09/02/14)  . Statins Other (See Comments)  Lethargy, muscle pain   Medications Prior to Admission  Medication Sig Dispense Refill  . aspirin EC 81 MG EC tablet Take 1 tablet (81 mg total) by mouth daily.    . furosemide (LASIX) 20 MG tablet Take 20 mg by mouth daily as needed for fluid or edema.    [name] ibuprofen (ADVIL,MOTRIN) 200 MG tablet Take 600 mg by mouth daily as needed for headache.    [name] LANTUS SOLOSTAR 100 UNIT/ML Solostar Pen Inject 80 Units into the skin every morning. Taper up to 110 units as directed until fasting glucose <130  5  . metFORMIN (GLUCOPHAGE-XR) 500 MG 24 hr tablet Take 1,000 mg by mouth every evening.   5  . metoprolol succinate (TOPROL-XL) 25 MG 24 hr tablet Take 25 mg by mouth daily.  0  . nitroGLYCERIN (NITROSTAT) 0.4 MG SL tablet Place 1 tablet (0.4 mg total) under the tongue every 5 (five) minutes x 3 doses as needed for chest pain. 25 tablet 2  . SYNTHROID 100 MCG tablet Take 100 mcg by  mouth daily.     . clopidogrel (PLAVIX) 75 MG tablet Take 1 tablet (75 mg total) by mouth daily. CALL FOR APPT FOR FURTHER REFILLS, THANKS! (Patient not taking: Reported on 02/09/2018) 15 tablet 0  . pantoprazole (PROTONIX) 40 MG tablet Take 1 tablet (40 mg total) by mouth daily. (Patient not taking: Reported on 08/04/2015) 30 tablet 11    Home: Home Living Family/patient expects to be discharged to:: Skilled nursing facility Additional Comments: Spoke to family after evaluation - pt lived at home with family and was able to hold a converstaion with her family but frequently repeated herself. Pt ableto complete her own aDL tasks and was independent with mobility.   Functional History: Prior Function Level of Independence: Independent Comments: see above Functional Status:  Mobility: Bed Mobility Overal bed mobility: Needs Assistance Bed Mobility: Rolling, Sidelying to Sit Rolling: Min assist Sidelying to sit: Mod assist, HOB elevated Sit to supine: Total assist, +2 for physical assistance General bed mobility comments: Able to initiate movement of BLEs and use of BUEs to get to EOB. RUE inattention noted.  Transfers Overall transfer level: Needs assistance Equipment used: 2 person hand held assist Transfers: Sit to/from Stand Sit to Stand: Mod assist, From elevated surface General transfer comment: Assist to power to standing with cues for upright posture and hip/back extension. Transferred to chair post ambulation. Ambulation/Gait Ambulation/Gait assistance: +2 physical assistance, Min assist Ambulation Distance (Feet): 7 Feet Assistive device: 2 person hand held assist Gait Pattern/deviations: Step-to pattern, Step-through pattern, Decreased dorsiflexion - right, Decreased step length - right, Decreased stance time - right, Trunk flexed General Gait Details: Slow, unsteady gait with RUE extensor tone, decreased foot clearance right foot esp when fatigued. Short step lengths  bilaterally.  Gait velocity: decreased    ADL: ADL Overall ADL's : Needs assistance/impaired Functional mobility during ADLs: Total assistance, +2 for physical assistance General ADL Comments: total A  Cognition: Cognition Overall Cognitive Status: Difficult to assess Arousal/Alertness: Lethargic Orientation Level: Oriented to person, Disoriented to place, Disoriented to time, Disoriented to situation Attention: Selective Selective Attention: Impaired Selective Attention Impairment: Verbal basic, Functional basic Memory: (difficult to assess d/t aphasia) Awareness: Impaired Awareness Impairment: Emergent impairment, Anticipatory impairment Cognition Arousal/Alertness: Lethargic, Suspect due to medications Behavior During Therapy: WFL for tasks assessed/performed Overall Cognitive Status: Difficult to assess Area of Impairment: Attention, Following commands, Problem solving Current Attention Level: Focused Following Commands: Follows one step commands inconsistently,  Follows one step commands with increased time Problem Solving: Requires verbal cues, Requires tactile cues General Comments: Follows simple 1 step commands inconsistently with increased time and gestural cues. Lethargic towards middle-end of session secondary to being given Haldol early this AM. Right hemi body inattention noted. Able to attend to right visual field but prefers left gaze preference.  Difficult to assess due to: Impaired communication  Blood pressure 136/84, pulse 87, temperature 98.7 F (37.1 C), temperature source Axillary, resp. rate 16, weight 59.8 kg (131 lb 13.4 oz), SpO2 97 %. Physical Exam  Nursing note and vitals reviewed. Constitutional: She appears well-developed and well-nourished.  HENT:  Head: Normocephalic and atraumatic.  Mouth/Throat: Oropharynx is clear and moist.  Eyes: Pupils are equal, round, and reactive to light. Conjunctivae and EOM are normal.  Neck: Normal range of motion.  Neck supple.  Cardiovascular: Normal rate and regular rhythm.  Respiratory: Effort normal and breath sounds normal. No stridor. No respiratory distress. She has no wheezes.  GI: Soft. Bowel sounds are normal. She exhibits no distension.  Musculoskeletal: She exhibits no edema or tenderness.  Neurological: She is alert.  Right inattention with difficulty moving eyes to right field. Gibberish speech with inability to follow simple commands.  MMT limited due to global aphasia.  Spontaneously moving LUE and slightly moving RUE, no movement noted in b/l LE, however, moving per family.  Skin: Skin is warm and dry.  Psychiatric: Her affect is inappropriate. Cognition and memory are impaired. She expresses impulsivity.    Results for orders placed or performed during the hospital encounter of 02/09/18 (from the past 24 hour(s))  Glucose, capillary     Status: Abnormal   Collection Time: 02/11/18  4:15 PM  Result Value Ref Range   Glucose-Capillary 247 (H) 65 - 99 mg/dL  Glucose, capillary     Status: Abnormal   Collection Time: 02/11/18  8:53 PM  Result Value Ref Range   Glucose-Capillary 196 (H) 65 - 99 mg/dL  Glucose, capillary     Status: Abnormal   Collection Time: 02/11/18 11:55 PM  Result Value Ref Range   Glucose-Capillary 171 (H) 65 - 99 mg/dL  Glucose, capillary     Status: Abnormal   Collection Time: 02/12/18  4:16 AM  Result Value Ref Range   Glucose-Capillary 168 (H) 65 - 99 mg/dL  Basic metabolic panel     Status: Abnormal   Collection Time: 02/12/18 10:17 AM  Result Value Ref Range   Sodium 138 135 - 145 mmol/L   Potassium 3.7 3.5 - 5.1 mmol/L   Chloride 105 101 - 111 mmol/L   CO2 22 22 - 32 mmol/L   Glucose, Bld 322 (H) 65 - 99 mg/dL   BUN 12 6 - 20 mg/dL   Creatinine, Ser 1.25 (H) 0.44 - 1.00 mg/dL   Calcium 8.9 8.9 - 10.3 mg/dL   GFR calc non Af Amer 40 (L) >60 mL/min   GFR calc Af Amer 46 (L) >60 mL/min   Anion gap 11 5 - 15  CBC     Status: None   Collection  Time: 02/12/18 10:17 AM  Result Value Ref Range   WBC 6.4 4.0 - 10.5 K/uL   RBC 4.10 3.87 - 5.11 MIL/uL   Hemoglobin 13.2 12.0 - 15.0 g/dL   HCT 38.3 36.0 - 46.0 %   MCV 93.4 78.0 - 100.0 fL   MCH 32.2 26.0 - 34.0 pg   MCHC 34.5 30.0 - 36.0 g/dL   RDW  13.2 11.5 - 15.5 %   Platelets 157 150 - 400 K/uL  Glucose, capillary     Status: Abnormal   Collection Time: 02/12/18 10:43 AM  Result Value Ref Range   Glucose-Capillary 325 (H) 65 - 99 mg/dL  Glucose, capillary     Status: Abnormal   Collection Time: 02/12/18 12:23 PM  Result Value Ref Range   Glucose-Capillary 266 (H) 65 - 99 mg/dL   Ct Head Wo Contrast  Result Date: 02/11/2018 CLINICAL DATA:  Stroke follow-up EXAM: CT HEAD WITHOUT CONTRAST TECHNIQUE: Contiguous axial images were obtained from the base of the skull through the vertex without intravenous contrast. COMPARISON:  Head CT 08/04/2015, 02/09/2018 Brain MRI 02/10/2018 FINDINGS: Brain: No mass lesion, intraparenchymal hemorrhage or extra-axial collection. No evidence of acute cortical infarct. Old bilateral frontal lobe and left occipital lobe infarcts. Mild hypoattenuation in the left periatrial white matter, corresponding to possible diffusion abnormality. Vascular: No hyperdense vessel or unexpected vascular calcification. Skull: Normal visualized skull base, calvarium and extracranial soft tissues. Sinuses/Orbits: Complete opacification of the left sphenoid sinus. Normal orbits. IMPRESSION: 1. Mild hypoattenuation in the left periatrial white matter, corresponding to the area of possible diffusion abnormality on the recent MRI. This appears slightly more prominent than on the most recent head CT and could indicate expected evolution of subacute ischemia. 2. No hemorrhage. 3. Old bilateral frontal lobe and left occipital lobe infarcts. Electronically Signed   By: Ulyses Jarred M.D.   On: 02/11/2018 05:26   Mr Brain Wo Contrast  Result Date: 02/10/2018 CLINICAL DATA:  83 y/o  F;  slurred speech, suspected stroke. EXAM: MRI HEAD WITHOUT CONTRAST TECHNIQUE: Axial DWI and coronal DWI sequences were acquired. Patient was unable to continue the examination. COMPARISON:  02/09/2018 CT of the head and CTA of the head. FINDINGS: Severe motion artifact of diffusion weighted sequences. Possible subcentimeter foci of reduced diffusion in left thalamus and left parietal periventricular white matter which may represent small areas of acute/early subacute infarction. IMPRESSION: 1. Severe motion artifact.  Only DWI sequences acquired. 2. Possible subcentimeter acute/early subacute infarctions in left thalamus and left parietal periventricular white matter. Electronically Signed   By: Kristine Garbe M.D.   On: 02/10/2018 18:06    Assessment/Plan: Diagnosis: Infarcts in left thalamus and left parietal periventricular white matter Labs and images independently reviewed.  Records reviewed and summated above. Stroke: Continue secondary stroke prophylaxis and Risk Factor Modification listed below:   Antiplatelet therapy:   Blood Pressure Management:  Continue current medication with prn's with permisive HTN per primary team Statin Agent:   Diabetes management:    1. Does the need for close, 24 hr/day medical supervision in concert with the patient's rehab needs make it unreasonable for this patient to be served in a less intensive setting? Yes  2. Co-Morbidities requiring supervision/potential complications: CAD (cont meds), chemo induced CM, prior stroke, dementia, UTI (completed abx), DM (Monitor in accordance with exercise and adjust meds as necessary), hypothyroidism (cont meds, ensure appropriate mood and energy level for therapies), agitation (limit medications as much as possible), AKI (avoid nephrotoxic meds) 3. Due to safety, disease management, medication administration and patient education, does the patient require 24 hr/day rehab nursing? Yes 4. Does the patient require  coordinated care of a physician, rehab nurse, PT (1-2 hrs/day, 5 days/week), OT (1-2 hrs/day, 5 days/week) and SLP (1-2 hrs/day, 5 days/week) to address physical and functional deficits in the context of the above medical diagnosis(es)? Yes Addressing deficits in the following  areas: balance, endurance, locomotion, strength, transferring, bowel/bladder control, bathing, dressing, grooming, toileting, cognition, speech, language and psychosocial support 5. Can the patient actively participate in an intensive therapy program of at least 3 hrs of therapy per day at least 5 days per week? Yes 6. The potential for patient to make measurable gains while on inpatient rehab is excellent 7. Anticipated functional outcomes upon discharge from inpatient rehab are supervision  with PT, supervision with OT, min assist and mod assist with SLP. 8. Estimated rehab length of stay to reach the above functional goals is: 19-23 days. 9. Anticipated D/C setting: Home 10. Anticipated post D/C treatments: HH therapy and Home excercise program 11. Overall Rehab/Functional Prognosis: good  RECOMMENDATIONS: This patient's condition is appropriate for continued rehabilitative care in the following setting: CIR if caregiver support available upon discharge when medically stable.  Patient has agreed to participate in recommended program. Potentially Note that insurance prior authorization may be required for reimbursement for recommended care.  Comment: Rehab Admissions Coordinator to follow up.  Delice Lesch, MD, ABPMR Bary Leriche, PA-C 02/12/2018

## 2018-02-12 NOTE — Progress Notes (Signed)
Inpatient Rehabilitation  Patient was screened by Gunnar Fusi for appropriateness for an Inpatient Acute Rehab consult.  At this time patient doesn't appear to be able to tolerate or even participate in 3 hours of therapy.  Will plan to follow for potential improvements.  Call if questions.    Carmelia Roller., CCC/SLP Admission Coordinator  [facility]  Cell [phone]

## 2018-02-12 NOTE — Progress Notes (Signed)
  Speech Language Pathology Treatment: Dysphagia;Cognitive-Linquistic  Patient Details Name: [name] MRN: [id number] DOB: June 23, 1935 Today's Date: 02/12/2018 Time: 7915-0569 SLP Time Calculation (min) (ACUTE ONLY): 27 min  Assessment / Plan / Recommendation Clinical Impression  Dysphagia treatment with focus on diet upgrade. SLP provided diagnostic po trials, regular texture solids and thin liquids. Under skilled observation, patient able to self feed with mildly prolonged but functional oral transit of bolus, no overt s/s of aspiration, and independent use of safe swallowing strategies. Per daughter, mastication prolonged at baseline. Appropriate for diet upgrade to regular textures. No further SLP needs indicated at this time for dysphagia.   Language treatment complete. Patient requires max verbal and visual cueing for carryout of basic 1-step directions, improving minimally in the context of a functional task. Verbal output fluent but largely unintelligible with frequent neologisms, jargon. Intelligible verbal output limited to word or short phrase automatic speech in the form of social greetings. Familiar, rhythmic task complete using MIT with performance at less than 50% accurate with max visual, verbal, and tactile cueing. Daughter present and education provided regarding current level of function, compensatory strategies to maximize communication, and prognosis. Recommend assessment with communication board next session. Ability to successfully utilize guarded at this time.      HPI HPI: Pt is an 83 y.o. female with PMH of MI, past stroke, HTN, HLD, T2DM, hypothyroidism, degenerative joint disease, CAD w/ CABG, presenting to hospital on 3/22 with AMS. MRI showed Possible subcentimeter acute/early subacute infarctions in left thalamus and left parietal periventricular white matter. EEG showed intermittent frontal slowing- nonspecific. MD reported significant aphasia/ word salad, poor  comprehension. Passed RN swallow screen however MD note says "SLP swallow eval- failed to participate yesterday". Cognitive linguistic eval ordered, requested RN to order bedside swallow.      SLP Plan  Other (Comment)(dysphagia goals met)       Recommendations  Diet recommendations: Regular;Thin liquid Liquids provided via: Cup;Straw Medication Administration: Whole meds with liquid Supervision: Patient able to self feed;Intermittent supervision to cue for compensatory strategies Compensations: Slow rate;Small sips/bites Postural Changes and/or Swallow Maneuvers: Seated upright 90 degrees                General recommendations: Rehab consult Oral Care Recommendations: Oral care BID Follow up Recommendations: Inpatient Rehab SLP Visit Diagnosis: Aphasia (R47.01);Dysphagia, oral phase (R13.11) Plan: Other (Comment)(dysphagia goals met)       GO             Gabriel Rainwater MA, CCC-SLP [phone]    [name] 02/12/2018, 9:43 AM

## 2018-02-12 NOTE — Progress Notes (Signed)
Patient appears to be restless, continues to try to get out of bed.  Patient does not follow commands.  Safety sitter at the bedside at this time   RN will continue to monitor patient

## 2018-02-12 NOTE — Progress Notes (Signed)
Family Medicine Teaching Service Daily Progress Note Intern Pager: [phone]  Patient name: [name] Medical record number: [id number] Date of birth: 1935-10-03 Age: 83 y.o. Gender: female  Primary Care Provider: Cyndi Bender, PA-C Consultants: neuro Code Status: full (advanced directive names HPOAs and declines life sustaining measures if in permanent coma or vegetative state)  Pt Overview and Major Events to Date:  [name] is a 83 y.o. female presenting with AMS. PMH is significant for MI, past stroke, HTN, HLD, T2DM, hypothyroidism, degenerative joint disease, CAD w/ CABG  Assessment and Plan:  Aphasia and facial droop in the setting of CVA:  Outside TPA time window on presentation. CT was neg for intracranial hemorrhage x2. Patient continued to have significant aphasia/word salad on our exam and neuro recommended admission for stroke workup. -Neurology following, appreciate recommendations -aspirin 325 -anticoagulation per neuro recs pending (not candidate for plavix/brilinta) -SLP swallow eval-dys 3 -NPO  -IVF D5 1/2NS at 37ml/hr  -PT/OT recommend SNF, family declined - Patient unable to have full MRI obtained yesterday due to movement, even with ativan administration. Neuro ordered repeat CT without which shows: .1 Mild hypoattenuation in left periatrial white matter, corresponding to area of possible diffusion abnormality on recent MRI. This appears slightly more prominent than on most recent head CT and could indicate expected evolution of subacute ischemia. 2. No hemorrhage. 3. Old bilateral frontal lobe and left occipital lobe infarcts. - LE venous doppler pending  New EF of 25%- HFpEF down from 45-50%: Chronic combined systolic and diastolic congestive heart failure. Last prior EF 40-45% in 2015.  No signs of volume overload on exam, will continue to monitor volume status. -cardiology consulted -  Zetia 10 - continue ASA -stopped BB due to  bradycardia -started lisinopril -TEE/loop was recommended by neuro but not by cardiology  UTI: Large leuks/positive nitrites on UA.  {Patient cannot reliably communicate symptoms).   --Ceftriaxone (3/22-24) --Urine culture> multiple species present, recommend repeat- will hold off as it will likely not grow anything due to abx tx  T2DM: Per daughter on home 80 lantus and metformin.  Last glucose 160,145.  Patient will need careful glycemic control while inpatient, now on D5 1/2NS.  Patient and daughter do not think they can administer even a simplified sliding scale. --Will start with SSI TID given NPO status --will consider trulicity --hold metformin  CAD: Contributing risk factor to CVA.  Does not tolerate statins however will likely benefit from aspirin given multiple risk factors and significant history with multiple CVA. -Follow Neurology recommendations  DJD: chronic, can ambulate independently at baseline -no acute intervention needed  HLD: chronic. Patient does not tolerate statins -neuro recs - May consider Zetia  Hypothyroidism: chronic, on home 100 synthroid.  TSH slightly low. -reduce to 75 synthroid  CAD s/p CABG x3: Patient unable to communicate verbally. Trop negx3 -will monitor clinically  FEN/GI: npo will reassess if patient passes swallow screen (now on D5 1/2NS) Prophylaxis: SCDs   Husband is HPOA> Tiahna Cure [phone] first line and then daughter   Disposition: continued inpatient stay, the [facility].  Patient's family declining SNF  Subjective:  Family not present at bedside, patient still unable to communicate (expressive aphasia) with only minimal ability to follow directions.  All limbs moving spontaneously as she tries to move around, able to make eye contact.  Objective: Temp:  [97.4 F (36.3 C)-98.1 F (36.7 C)] 97.8 F (36.6 C) (03/25 0410) Pulse Rate:  [54-74] 57 (03/25 0410) Resp:  [16] 16 (  03/25 0410) BP: (138-159)/(50-87) 151/50  (03/25 0410) SpO2:  [96 %-100 %] 100 % (03/25 0410) Physical Exam: General: NAD, calm but no oriented/communicative ENTM: Moist mucous membranes Cardiovascular: RRR, 3/6 murmur, no LE edema Respiratory: CTA BL, normal work of breathing Gastrointestinal: soft, nontender, nondistended MSK: moves 4 extremities equally Derm: no rashes appreciated Neuro: awake, alert, eyes open, not verbally appropriately communicating. RUE and RLE with 4/5 strength.  Laboratory: Recent Labs  Lab 02/09/18 1306 02/09/18 1323  WBC 7.6  --   HGB 13.7 13.3  HCT 40.0 39.0  PLT 156  --    Recent Labs  Lab 02/09/18 1306 02/09/18 1323 02/11/18 0527  NA 139 143 142  K 3.8 3.9 3.7  CL 107 106 107  CO2 23  --  21*  BUN 14 15 10   CREATININE 1.09* 1.00 0.98  CALCIUM 8.9  --  9.1  PROT 6.6  --   --   BILITOT 0.6  --   --   ALKPHOS 82  --   --   ALT 19  --   --   AST 25  --   --   GLUCOSE 163* 159* 129*   Lipid Panel     Component Value Date/Time   CHOL 193 02/10/2018 0428   TRIG 125 02/10/2018 0428   HDL 37 (L) 02/10/2018 0428   CHOLHDL 5.2 02/10/2018 0428   VLDL 25 02/10/2018 0428   LDLCALC 131 (H) 02/10/2018 0428    A1C 8.6 TSH 0.317  Imaging/Diagnostic Tests: No results found.   Sherene Sires, DO 02/12/2018, 6:47 AM PGY-1, [facility] Intern pager: [phone], text pages welcome

## 2018-02-12 NOTE — Progress Notes (Signed)
Rehab admissions - I received a call from unit RN.  PT now recommending inpatient rehab consult.  I have text paged attending MD asking for order for inpatient rehab consult.  Call me for questions.  #[phone]

## 2018-02-12 NOTE — Progress Notes (Addendum)
INTERIM PROGRESS  Paged about lack of sitters available to supervise an altered patient.  I want to thank the RN who put in the work to secure a sitter for another 4hours so this patient is supervised until 11pm.  We discussed how important it was to at least have a video sitter available after that as this patient cannot be left unsupervised in this state.  1mg  haldol ordered  -Dr. Criss Rosales

## 2018-02-12 NOTE — Progress Notes (Signed)
I will begin insurance authorization for a possible admit to CIR pending insurance approval. I will follow up tomorrow. [phone]

## 2018-02-12 NOTE — Progress Notes (Addendum)
STROKE TEAM PROGRESS NOTE   SUBJECTIVE (INTERVAL HISTORY) Her daughter-in-law, husband and younger female family member are at the bedside. Patient was adjitated during the night due to back pain, trying to get out of the bed, so was administered Haldol. This am, much more interactive with SLP and PT, who now recommends IP rehab re-assessment. Family prefers IP rehab. They do not want SNF. Patient still has global aphasia, but interviewer now able to understand language and intent.    OBJECTIVE Temp:  [97.4 F (36.3 C)-98.7 F (37.1 C)] 98.7 F (37.1 C) (03/25 0824) Pulse Rate:  [54-87] 87 (03/25 0824) Cardiac Rhythm: Normal sinus rhythm (03/25 1000) Resp:  [16] 16 (03/25 0824) BP: (136-159)/(50-87) 136/84 (03/25 0824) SpO2:  [96 %-100 %] 97 % (03/25 0824)  CBC:  Recent Labs  Lab 02/09/18 1306 02/09/18 1323  WBC 7.6  --   NEUTROABS 4.9  --   HGB 13.7 13.3  HCT 40.0 39.0  MCV 93.9  --   PLT 156  --     Basic Metabolic Panel:  Recent Labs  Lab 02/11/18 0527 02/12/18 1017  NA 142 138  K 3.7 3.7  CL 107 105  CO2 21* 22  GLUCOSE 129* 322*  BUN 10 12  CREATININE 0.98 1.25*  CALCIUM 9.1 8.9    Lipid Panel:     Component Value Date/Time   CHOL 193 02/10/2018 0428   TRIG 125 02/10/2018 0428   HDL 37 (L) 02/10/2018 0428   CHOLHDL 5.2 02/10/2018 0428   VLDL 25 02/10/2018 0428   LDLCALC 131 (H) 02/10/2018 0428   HgbA1c:  Lab Results  Component Value Date   HGBA1C 8.6 (H) 02/10/2018   Urine Drug Screen:     Component Value Date/Time   LABOPIA NONE DETECTED 02/09/2018 1433   COCAINSCRNUR NONE DETECTED 02/09/2018 1433   LABBENZ NONE DETECTED 02/09/2018 1433   AMPHETMU NONE DETECTED 02/09/2018 1433   THCU NONE DETECTED 02/09/2018 1433   LABBARB NONE DETECTED 02/09/2018 1433    Alcohol Level     Component Value Date/Time   ETH <10 02/09/2018 1306    IMAGING  Ct Head Wo Contrast 02/09/2018 IMPRESSION:  No acute abnormality. Atrophy, chronic microvascular  ischemic change remote bilateral frontal infarcts. Atherosclerosis. Chronic left sphenoid sinus disease.   Ct Angio Head W Or Wo Contrast Ct Angio Neck W Or Wo Contrast 02/10/2018 IMPRESSION:  1. Negative CTA for large vessel occlusion.  2. Atheromatous stenoses of approximately 60% about the carotid bifurcations/proximal ICAs bilaterally.  3. Attenuation of the distal left MCA branches, most likely related to chronic left MCA territory infarct.  4. Moderate to severe left V1 and V2 stenoses as above.  5. Advanced intracranial atherosclerotic disease throughout the remaining intracranial circulation, most notable within the carotid siphons, right worse than left.   Dg Chest 2 View 02/09/2018 IMPRESSION:  No active cardiopulmonary disease.   LE venous doppler no DVT  EEG This is an abnormal EEG due to intermittent frontal slowing (FIRDA).   FIRDA is a non-specific finding that can be seen with toxic, metabolic, diffuse, or multifocal structural processes.  No definite epileptiform changes were noted.   A single EEG without epileptiform changes does not exclude the diagnosis of epilepsy. Clinical correlation advised.  Transthoracic Echocardiogram - Left ventricle: Septal and apical akinesis inferior wall hypokinesis. The cavity size was severely dilated. Wall thickness was normal. The estimated ejection fraction was 25%. Doppler parameters are consistent with both elevated ventricular end-diastolic filling pressure and  elevated left atrial filling pressure. - Aortic valve: Given morphology and decreased EF AS likely more in moderae range despite low gradient. There was mild stenosis. There was mild regurgitation. - Mitral valve: Calcified annulus. Mildly thickened leaflets . - Atrial septum: No defect or patent foramen ovale was identified.   PHYSICAL EXAM General - Well nourished, well developed, in no apparent distress.  Cardiovascular - Regular rate and rhythm.  Neuro - lethargic  post therapy sessions. Awakens to stimulation. follows simple commands inconsistently. Still with word salad and expressive aphasia but able to say "take that away from me" spontaneously, not able to name or repeat.  Left gaze preference, barely cross midline, right neglect, not blinking to visual threat on the right but does on the Left.  Right facial droop, tongue midline, right upper extremity and lower extremity 4/5 with drift, RUE and RLE normal strength without drift. Able to hold a cup in her hand to her mouth on the Left. Sensation grossly intact. HRR. Mild exp wheeze.   ASSESSMENT/PLAN [name] is a 83 y.o. female with history of coronary artery disease, hypothyroidism, hyperlipidemia, renal artery stenosis, previous DVT, history of breast cancer, diabetes mellitus, dementia, previous strokes, chronic kidney disease, and cardiomyopathy presenting with aphasia and right-sided weakness. She did not receive IV t-PA due to late presentation.  Stroke - left MCA small frontoparietal infarct - embolic pattern, likely due to low EF. Undiagnosed afib not excluded.   Resultant global aphasia, right hemiparesis, right facial droop, right neglect, left gaze  CT head - No acute abnormality. Remote bilateral frontal infarcts  MRI head - not cooperative  CT repeat - left frontoparietal small infarcts  EEG - FIRDA but no seizure  CTA H&N - bilateral ICA proximal 60% stenosis, left M2 stenosis  2D Echo - EF 25%, down from 40-45% in the past  OK not to perform TEE if pt will be on anticoagulation  LE venous Doppler negative for DVT  LDL - 131  HgbA1c - 8.6  VTE prophylaxis - SCDs Fall precautions Diet regular Room service appropriate? Yes; Fluid consistency: Thin  aspirin 81 mg daily and clopidogrel 75 mg daily prior to admission, now on aspirin 325 mg daily.   Ongoing aggressive stroke risk factor management  Therapy recommendations:  CIR  Disposition:   Pending  Cardiomyopathy with low EF  TTE showed EF 25%  08/2014 - EF 40-45%  Follows with Dr. Ellyn Hack in cardiology  Likely the source of embolic stroke  Cardiology on board, on lisinopril   Recommend anticoagulation starting tomorrow 02/13/18, preferably DOAC, for stroke prevention. Repeat TTE in 2-3 months, once EF >= 35%, anticoagulation can be discontinued  Recommend outpt 30 day cardiac event monitoring to rule out underlying afib as outpt at time of discharge or at the end of IP rehab stay  History of TIA/stroke  TIA in 04/2009 and 06/2009  1988 CVA- right eye vision difficulty  2011 left frontal infarct  04/2011 -right-sided weakness and aphasia - CT showed old left frontal infarct - MRI showed a left PLIC/thalamic infarct  Baseline mild dementia  Short-term memory deficit  But able to perform all ADLs  Still driving locally   Able to carry normal conversation  Need outpt follow up with neurology for monitoring  UTI  UA WBC TNTC  On Rocephin  Hypertension  Stable On lisinopril  Long-term BP goal normotensive  Hyperlipidemia  Lipid lowering medication PTA: none  LDL 131, goal < 70  Current lipid  lowering medication: zetia 10  History of statin intolerance.   Diabetes  HgbA1c 8.6, goal < 7.0  Uncontrolled  SSI  CBG monitoring  Other Stroke Risk Factors  Advanced age  Coronary artery disease status post CABG and stenting  Left renal artery stenosis status post stent in 2010  Other Active Problems  Plavix nonresponder. Brilinta and statin intolerance.  Breast cancer status post chemotherapy, radiation and surgery in 2015  Hospital day # [place] for Pager information 02/12/2018 11:53 AM   ATTENDING NOTE: I reviewed above note and agree with the assessment and plan. I have made any additions or clarifications directly to the above note. Pt was seen and examined.   Patient sleepy  during rounds, likely due to Haldol overnight.  Able to arouse, still has global aphasia, however, left gaze and right neglect much improved.  Able to attend to the right side and across midline of right gaze.  DVT negative.  TEE canceled given likely patient will be on anticoagulation.  Recommend start eliquis from tomorrow for stroke prevention.  Repeat 2D echo in 2-3 months, once EF more than 35%, anticoagulation can be discontinued. Follow up with stroke clinic in 4-6 weeks.  PT/OT recommend CIR.  Neurology will sign off. Please call with questions. Pt will follow up with stroke clinic at [facility] in about 4-6 weeks. Thanks for the consult.   Rosalin Hawking, MD PhD Stroke Neurology 02/12/2018 3:24 PM    To contact Stroke Continuity provider, please refer to [email or web address] After hours, contact General Neurology

## 2018-02-12 NOTE — Progress Notes (Signed)
VASCULAR LAB PRELIMINARY  PRELIMINARY  PRELIMINARY  PRELIMINARY  Bilateral lower extremity venous duplex completed.    Preliminary report:  There is no DVT or SVT noted in the bilateral lower extremities.   Taquila Leys, RVT 02/12/2018, 2:42 PM

## 2018-02-12 NOTE — Progress Notes (Signed)
Patient continues to be confused, trying to get out of bed.  Not following any commands  Patient given PRN haldol   RN will continue to monitor patient

## 2018-02-12 NOTE — Progress Notes (Signed)
Physical Therapy Treatment Patient Details Name: [name] MRN: [id number] DOB: [birth year] Today's Date: 02/12/2018    History of Present Illness Pt is an 83 y/o female admitted on 02/09/18 secondary to facial droop and aphasia. Pt found to have a L MCA infarct. PMH including but not limited to dementia, HTN, HLD, CAD, DM, CKD II, Hypothyroidism, RAS, CAD s/p CABG, ischemic cardiomyopathy and prior CVAs.    PT Comments    Patient progressing well towards PT goals. More alert today but still slightly lethargic towards end of session due to haldol being given earlier this AM. Pt able to follow simple 1 step commands inconsistently with increased time and gestural cues. Tolerated gait training with HHA of 2 due to right sided weakness, imbalance and RUE extensor tone. Difficult to comprehend speech some of the time as pt demonstrates expressive difficulties. Has very supportive family and was very mobile PTA. Lengthy discussion with daughter about disposition. She is adamant about no SNF. Discharge recommendation updated to CIR as pt a great candidate. Will continue to follow and progress as tolerated.    Follow Up Recommendations  CIR;Supervision for mobility/OOB;Supervision/Assistance - 24 hour     Equipment Recommendations  None recommended by PT    Recommendations for Other Services Rehab consult     Precautions / Restrictions Precautions Precautions: Fall Restrictions Weight Bearing Restrictions: No    Mobility  Bed Mobility Overal bed mobility: Needs Assistance Bed Mobility: Rolling;Sidelying to Sit Rolling: Min assist Sidelying to sit: Mod assist;HOB elevated       General bed mobility comments: Able to initiate movement of BLEs and use of BUEs to get to EOB. RUE inattention noted.   Transfers Overall transfer level: Needs assistance Equipment used: 2 person hand held assist Transfers: Sit to/from Stand Sit to Stand: Mod assist;From elevated surface          General transfer comment: Assist to power to standing with cues for upright posture and hip/back extension. Transferred to chair post ambulation.  Ambulation/Gait Ambulation/Gait assistance: +2 physical assistance;Min assist Ambulation Distance (Feet): 7 Feet Assistive device: 2 person hand held assist Gait Pattern/deviations: Step-to pattern;Step-through pattern;Decreased dorsiflexion - right;Decreased step length - right;Decreased stance time - right;Trunk flexed Gait velocity: decreased   General Gait Details: Slow, unsteady gait with RUE extensor tone, decreased foot clearance right foot esp when fatigued. Short step lengths bilaterally.    Stairs            Wheelchair Mobility    Modified Rankin (Stroke Patients Only) Modified Rankin (Stroke Patients Only) Pre-Morbid Rankin Score: Moderate disability Modified Rankin: Moderately severe disability     Balance Overall balance assessment: Needs assistance Sitting-balance support: Feet supported;No upper extremity supported Sitting balance-Leahy Scale: Poor Sitting balance - Comments: Pt with LOB towards right with RUE in extensor pattern requiring external support intermittently.   Standing balance support: During functional activity;Single extremity supported Standing balance-Leahy Scale: Poor Standing balance comment: Requires Min-Mod A for static and dynamic standing balance.                             Cognition Arousal/Alertness: Lethargic;Suspect due to medications Behavior During Therapy: [facility] for tasks assessed/performed Overall Cognitive Status: Difficult to assess Area of Impairment: Attention;Following commands;Problem solving                   Current Attention Level: Focused   Following Commands: Follows one step commands inconsistently;Follows one step commands with  increased time     Problem Solving: Requires verbal cues;Requires tactile cues General Comments: Follows simple 1  step commands inconsistently with increased time and gestural cues. Lethargic towards middle-end of session secondary to being given Haldol early this AM. Right hemi body inattention noted. Able to attend to right visual field but prefers left gaze preference.       Exercises      General Comments General comments (skin integrity, edema, etc.): Daughter present during session.      Pertinent Vitals/Pain Pain Assessment: Faces Faces Pain Scale: No hurt    Home Living                      Prior Function            PT Goals (current goals can now be found in the care plan section) Progress towards PT goals: Progressing toward goals    Frequency    Min 4X/week      PT Plan Frequency needs to be updated    Co-evaluation              AM-PAC PT "6 Clicks" Daily Activity  Outcome Measure  Difficulty turning over in bed (including adjusting bedclothes, sheets and blankets)?: Unable Difficulty moving from lying on back to sitting on the side of the bed? : Unable Difficulty sitting down on and standing up from a chair with arms (e.g., wheelchair, bedside commode, etc,.)?: Unable Help needed moving to and from a bed to chair (including a wheelchair)?: A Little Help needed walking in hospital room?: A Lot Help needed climbing 3-5 steps with a railing? : A Lot 6 Click Score: 10    End of Session Equipment Utilized During Treatment: Gait belt Activity Tolerance: Patient tolerated treatment well;Patient limited by lethargy(secondary to medication) Patient left: in chair;with call bell/phone within reach;with chair alarm set;with family/visitor present Nurse Communication: Mobility status PT Visit Diagnosis: Other abnormalities of gait and mobility (R26.89);Hemiplegia and hemiparesis Hemiplegia - Right/Left: Right Hemiplegia - dominant/non-dominant: Dominant Hemiplegia - caused by: Cerebral infarction     Time: 0925-0958 PT Time Calculation (min) (ACUTE ONLY):  33 min  Charges:  $Gait Training: 8-22 mins $Neuromuscular Re-education: 8-22 mins                    G Codes:       Wray Kearns, PT, DPT [phone]     Marguarite Arbour A Davene Jobin 02/12/2018, 10:17 AM

## 2018-02-13 ENCOUNTER — Inpatient Hospital Stay (HOSPITAL_COMMUNITY)
Admission: RE | Admit: 2018-02-13 | Discharge: 2018-03-02 | DRG: 057 | Disposition: A | Discharge status: Hospice | Payer: Medicare Other | Source: Intra-hospital | Attending: Physical Medicine & Rehabilitation | Admitting: Physical Medicine & Rehabilitation

## 2018-02-13 ENCOUNTER — Other Ambulatory Visit: Payer: Self-pay

## 2018-02-13 ENCOUNTER — Encounter (HOSPITAL_COMMUNITY): Payer: Self-pay

## 2018-02-13 DIAGNOSIS — I5042 Chronic combined systolic (congestive) and diastolic (congestive) heart failure: Secondary | ICD-10-CM | POA: Diagnosis present

## 2018-02-13 DIAGNOSIS — I69351 Hemiplegia and hemiparesis following cerebral infarction affecting right dominant side: Secondary | ICD-10-CM

## 2018-02-13 DIAGNOSIS — Z09 Encounter for follow-up examination after completed treatment for conditions other than malignant neoplasm: Secondary | ICD-10-CM

## 2018-02-13 DIAGNOSIS — I69392 Facial weakness following cerebral infarction: Secondary | ICD-10-CM

## 2018-02-13 DIAGNOSIS — F05 Delirium due to known physiological condition: Secondary | ICD-10-CM

## 2018-02-13 DIAGNOSIS — I130 Hypertensive heart and chronic kidney disease with heart failure and stage 1 through stage 4 chronic kidney disease, or unspecified chronic kidney disease: Secondary | ICD-10-CM | POA: Diagnosis present

## 2018-02-13 DIAGNOSIS — Z7982 Long term (current) use of aspirin: Secondary | ICD-10-CM

## 2018-02-13 DIAGNOSIS — Z90710 Acquired absence of both cervix and uterus: Secondary | ICD-10-CM

## 2018-02-13 DIAGNOSIS — Z794 Long term (current) use of insulin: Secondary | ICD-10-CM

## 2018-02-13 DIAGNOSIS — I69312 Visuospatial deficit and spatial neglect following cerebral infarction: Secondary | ICD-10-CM

## 2018-02-13 DIAGNOSIS — Z951 Presence of aortocoronary bypass graft: Secondary | ICD-10-CM

## 2018-02-13 DIAGNOSIS — Z7902 Long term (current) use of antithrombotics/antiplatelets: Secondary | ICD-10-CM

## 2018-02-13 DIAGNOSIS — E118 Type 2 diabetes mellitus with unspecified complications: Secondary | ICD-10-CM

## 2018-02-13 DIAGNOSIS — G4089 Other seizures: Secondary | ICD-10-CM | POA: Diagnosis not present

## 2018-02-13 DIAGNOSIS — E1165 Type 2 diabetes mellitus with hyperglycemia: Secondary | ICD-10-CM | POA: Diagnosis present

## 2018-02-13 DIAGNOSIS — Z9012 Acquired absence of left breast and nipple: Secondary | ICD-10-CM | POA: Diagnosis not present

## 2018-02-13 DIAGNOSIS — I255 Ischemic cardiomyopathy: Secondary | ICD-10-CM | POA: Diagnosis present

## 2018-02-13 DIAGNOSIS — I4891 Unspecified atrial fibrillation: Secondary | ICD-10-CM | POA: Diagnosis present

## 2018-02-13 DIAGNOSIS — E039 Hypothyroidism, unspecified: Secondary | ICD-10-CM | POA: Diagnosis present

## 2018-02-13 DIAGNOSIS — Z66 Do not resuscitate: Secondary | ICD-10-CM | POA: Diagnosis present

## 2018-02-13 DIAGNOSIS — E785 Hyperlipidemia, unspecified: Secondary | ICD-10-CM | POA: Diagnosis present

## 2018-02-13 DIAGNOSIS — Z800 Family history of malignant neoplasm of digestive organs: Secondary | ICD-10-CM

## 2018-02-13 DIAGNOSIS — R4701 Aphasia: Secondary | ICD-10-CM

## 2018-02-13 DIAGNOSIS — E875 Hyperkalemia: Secondary | ICD-10-CM | POA: Diagnosis not present

## 2018-02-13 DIAGNOSIS — I63512 Cerebral infarction due to unspecified occlusion or stenosis of left middle cerebral artery: Secondary | ICD-10-CM | POA: Diagnosis not present

## 2018-02-13 DIAGNOSIS — F0390 Unspecified dementia without behavioral disturbance: Secondary | ICD-10-CM | POA: Diagnosis present

## 2018-02-13 DIAGNOSIS — R400 Somnolence: Secondary | ICD-10-CM | POA: Diagnosis not present

## 2018-02-13 DIAGNOSIS — R001 Bradycardia, unspecified: Secondary | ICD-10-CM | POA: Diagnosis not present

## 2018-02-13 DIAGNOSIS — E1122 Type 2 diabetes mellitus with diabetic chronic kidney disease: Secondary | ICD-10-CM | POA: Diagnosis present

## 2018-02-13 DIAGNOSIS — Z853 Personal history of malignant neoplasm of breast: Secondary | ICD-10-CM

## 2018-02-13 DIAGNOSIS — X58XXXA Exposure to other specified factors, initial encounter: Secondary | ICD-10-CM | POA: Diagnosis present

## 2018-02-13 DIAGNOSIS — Z86718 Personal history of other venous thrombosis and embolism: Secondary | ICD-10-CM

## 2018-02-13 DIAGNOSIS — Z923 Personal history of irradiation: Secondary | ICD-10-CM

## 2018-02-13 DIAGNOSIS — Z515 Encounter for palliative care: Secondary | ICD-10-CM

## 2018-02-13 DIAGNOSIS — R569 Unspecified convulsions: Secondary | ICD-10-CM

## 2018-02-13 DIAGNOSIS — Z9181 History of falling: Secondary | ICD-10-CM | POA: Diagnosis not present

## 2018-02-13 DIAGNOSIS — R339 Retention of urine, unspecified: Secondary | ICD-10-CM | POA: Diagnosis not present

## 2018-02-13 DIAGNOSIS — I252 Old myocardial infarction: Secondary | ICD-10-CM

## 2018-02-13 DIAGNOSIS — I2510 Atherosclerotic heart disease of native coronary artery without angina pectoris: Secondary | ICD-10-CM | POA: Diagnosis present

## 2018-02-13 DIAGNOSIS — Z23 Encounter for immunization: Secondary | ICD-10-CM | POA: Diagnosis not present

## 2018-02-13 DIAGNOSIS — Z7189 Other specified counseling: Secondary | ICD-10-CM

## 2018-02-13 DIAGNOSIS — Z96653 Presence of artificial knee joint, bilateral: Secondary | ICD-10-CM | POA: Diagnosis present

## 2018-02-13 DIAGNOSIS — Z8249 Family history of ischemic heart disease and other diseases of the circulatory system: Secondary | ICD-10-CM

## 2018-02-13 DIAGNOSIS — I10 Essential (primary) hypertension: Secondary | ICD-10-CM

## 2018-02-13 DIAGNOSIS — I69320 Aphasia following cerebral infarction: Principal | ICD-10-CM

## 2018-02-13 DIAGNOSIS — Z86010 Personal history of colonic polyps: Secondary | ICD-10-CM | POA: Diagnosis not present

## 2018-02-13 DIAGNOSIS — N179 Acute kidney failure, unspecified: Secondary | ICD-10-CM | POA: Diagnosis not present

## 2018-02-13 DIAGNOSIS — S5011XA Contusion of right forearm, initial encounter: Secondary | ICD-10-CM | POA: Diagnosis present

## 2018-02-13 DIAGNOSIS — Z79899 Other long term (current) drug therapy: Secondary | ICD-10-CM

## 2018-02-13 DIAGNOSIS — I672 Cerebral atherosclerosis: Secondary | ICD-10-CM | POA: Diagnosis present

## 2018-02-13 DIAGNOSIS — Z888 Allergy status to other drugs, medicaments and biological substances status: Secondary | ICD-10-CM

## 2018-02-13 DIAGNOSIS — R454 Irritability and anger: Secondary | ICD-10-CM | POA: Diagnosis not present

## 2018-02-13 DIAGNOSIS — Z791 Long term (current) use of non-steroidal anti-inflammatories (NSAID): Secondary | ICD-10-CM

## 2018-02-13 DIAGNOSIS — Z833 Family history of diabetes mellitus: Secondary | ICD-10-CM

## 2018-02-13 DIAGNOSIS — N182 Chronic kidney disease, stage 2 (mild): Secondary | ICD-10-CM | POA: Diagnosis present

## 2018-02-13 DIAGNOSIS — Z95820 Peripheral vascular angioplasty status with implants and grafts: Secondary | ICD-10-CM

## 2018-02-13 DIAGNOSIS — R509 Fever, unspecified: Secondary | ICD-10-CM

## 2018-02-13 DIAGNOSIS — IMO0002 Reserved for concepts with insufficient information to code with codable children: Secondary | ICD-10-CM | POA: Diagnosis present

## 2018-02-13 DIAGNOSIS — S5012XA Contusion of left forearm, initial encounter: Secondary | ICD-10-CM | POA: Diagnosis present

## 2018-02-13 DIAGNOSIS — Z79890 Hormone replacement therapy (postmenopausal): Secondary | ICD-10-CM

## 2018-02-13 DIAGNOSIS — Z803 Family history of malignant neoplasm of breast: Secondary | ICD-10-CM

## 2018-02-13 LAB — BASIC METABOLIC PANEL
ANION GAP: 12 (ref 5–15)
BUN: 12 mg/dL (ref 6–20)
CALCIUM: 8.8 mg/dL — AB (ref 8.9–10.3)
CHLORIDE: 105 mmol/L (ref 101–111)
CO2: 20 mmol/L — AB (ref 22–32)
Creatinine, Ser: 1.12 mg/dL — ABNORMAL HIGH (ref 0.44–1.00)
GFR calc non Af Amer: 45 mL/min — ABNORMAL LOW (ref >60)
GFR, EST AFRICAN AMERICAN: 52 mL/min — AB (ref >60)
Glucose, Bld: 222 mg/dL — ABNORMAL HIGH (ref 65–99)
POTASSIUM: 3.5 mmol/L (ref 3.5–5.1)
Sodium: 137 mmol/L (ref 135–145)

## 2018-02-13 LAB — GLUCOSE, CAPILLARY
GLUCOSE-CAPILLARY: 193 mg/dL — AB (ref 65–99)
GLUCOSE-CAPILLARY: 198 mg/dL — AB (ref 65–99)
GLUCOSE-CAPILLARY: 201 mg/dL — AB (ref 65–99)
GLUCOSE-CAPILLARY: 206 mg/dL — AB (ref 65–99)
GLUCOSE-CAPILLARY: 302 mg/dL — AB (ref 65–99)
GLUCOSE-CAPILLARY: 356 mg/dL — AB (ref 65–99)

## 2018-02-13 LAB — CBC
HEMATOCRIT: 39.5 % (ref 36.0–46.0)
HEMOGLOBIN: 14 g/dL (ref 12.0–15.0)
MCH: 31.9 pg (ref 26.0–34.0)
MCHC: 35.4 g/dL (ref 30.0–36.0)
MCV: 90 fL (ref 78.0–100.0)
Platelets: 161 10*3/uL (ref 150–400)
RBC: 4.39 MIL/uL (ref 3.87–5.11)
RDW: 12.5 % (ref 11.5–15.5)
WBC: 9 10*3/uL (ref 4.0–10.5)

## 2018-02-13 MED ORDER — ENOXAPARIN SODIUM 40 MG/0.4ML ~~LOC~~ SOLN
40.0000 mg | SUBCUTANEOUS | Status: DC
  Administered 2018-02-13: 40 mg via SUBCUTANEOUS
  Filled 2018-02-13: qty 0.4

## 2018-02-13 MED ORDER — LISINOPRIL 5 MG PO TABS
5.0000 mg | ORAL_TABLET | Freq: Every day | ORAL | Status: DC
  Administered 2018-02-14 – 2018-03-02 (×15): 5 mg via ORAL
  Filled 2018-02-13 (×16): qty 1

## 2018-02-13 MED ORDER — EZETIMIBE 10 MG PO TABS: 10.0000 mg | ORAL_TABLET | Freq: Every day | ORAL | Status: DC

## 2018-02-13 MED ORDER — PROCHLORPERAZINE 25 MG RE SUPP: 12.5000 mg | Freq: Three times a day (TID) | RECTAL | Status: DC | PRN

## 2018-02-13 MED ORDER — QUETIAPINE FUMARATE 25 MG PO TABS: 25.0000 mg | ORAL_TABLET | Freq: Every evening | ORAL | Status: DC | PRN

## 2018-02-13 MED ORDER — LISINOPRIL 5 MG PO TABS: 5.0000 mg | ORAL_TABLET | Freq: Every day | ORAL | Status: DC

## 2018-02-13 MED ORDER — INSULIN GLARGINE 100 UNIT/ML ~~LOC~~ SOLN
10.0000 [IU] | Freq: Every day | SUBCUTANEOUS | Status: DC
  Administered 2018-02-13 – 2018-02-16 (×4): 10 [IU] via SUBCUTANEOUS
  Filled 2018-02-13 (×4): qty 0.1

## 2018-02-13 MED ORDER — EZETIMIBE 10 MG PO TABS
10.0000 mg | ORAL_TABLET | Freq: Every day | ORAL | Status: DC
  Administered 2018-02-14 – 2018-03-02 (×15): 10 mg via ORAL
  Filled 2018-02-13 (×17): qty 1

## 2018-02-13 MED ORDER — PROCHLORPERAZINE MALEATE 5 MG PO TABS: 5.0000 mg | ORAL_TABLET | Freq: Four times a day (QID) | ORAL | Status: DC | PRN

## 2018-02-13 MED ORDER — PROCHLORPERAZINE EDISYLATE 5 MG/ML IJ SOLN: 5.0000 mg | Freq: Four times a day (QID) | INTRAMUSCULAR | Status: DC | PRN

## 2018-02-13 MED ORDER — QUETIAPINE FUMARATE 25 MG PO TABS
25.0000 mg | ORAL_TABLET | Freq: Every day | ORAL | Status: DC
  Administered 2018-02-13: 25 mg via ORAL
  Filled 2018-02-13: qty 1

## 2018-02-13 MED ORDER — INSULIN ASPART 100 UNIT/ML ~~LOC~~ SOLN
0.0000 [IU] | Freq: Three times a day (TID) | SUBCUTANEOUS | Status: DC
  Administered 2018-02-14: 3 [IU] via SUBCUTANEOUS
  Administered 2018-02-14: 2 [IU] via SUBCUTANEOUS
  Administered 2018-02-14: 5 [IU] via SUBCUTANEOUS
  Administered 2018-02-15: 2 [IU] via SUBCUTANEOUS
  Administered 2018-02-15: 9 [IU] via SUBCUTANEOUS
  Administered 2018-02-15 – 2018-02-16 (×2): 5 [IU] via SUBCUTANEOUS
  Administered 2018-02-16: 7 [IU] via SUBCUTANEOUS
  Administered 2018-02-16: 2 [IU] via SUBCUTANEOUS
  Administered 2018-02-17: 5 [IU] via SUBCUTANEOUS
  Administered 2018-02-17: 9 [IU] via SUBCUTANEOUS
  Administered 2018-02-17: 5 [IU] via SUBCUTANEOUS
  Administered 2018-02-18: 9 [IU] via SUBCUTANEOUS
  Administered 2018-02-18 – 2018-02-19 (×2): 5 [IU] via SUBCUTANEOUS
  Administered 2018-02-19: 3 [IU] via SUBCUTANEOUS
  Administered 2018-02-20: 2 [IU] via SUBCUTANEOUS
  Administered 2018-02-20: 3 [IU] via SUBCUTANEOUS
  Administered 2018-02-20 – 2018-02-21 (×2): 2 [IU] via SUBCUTANEOUS
  Administered 2018-02-21: 3 [IU] via SUBCUTANEOUS
  Administered 2018-02-22 (×3): 2 [IU] via SUBCUTANEOUS
  Administered 2018-02-23: 3 [IU] via SUBCUTANEOUS
  Administered 2018-02-23: 2 [IU] via SUBCUTANEOUS
  Administered 2018-02-23: 3 [IU] via SUBCUTANEOUS
  Administered 2018-02-24: 2 [IU] via SUBCUTANEOUS
  Administered 2018-02-24: 1 [IU] via SUBCUTANEOUS
  Administered 2018-02-25: 3 [IU] via SUBCUTANEOUS
  Administered 2018-02-25: 5 [IU] via SUBCUTANEOUS
  Administered 2018-02-26: 1 [IU] via SUBCUTANEOUS
  Administered 2018-02-26: 2 [IU] via SUBCUTANEOUS
  Administered 2018-02-26: 1 [IU] via SUBCUTANEOUS
  Administered 2018-02-27: 5 [IU] via SUBCUTANEOUS
  Administered 2018-02-27: 2 [IU] via SUBCUTANEOUS
  Administered 2018-02-28: 1 [IU] via SUBCUTANEOUS
  Administered 2018-02-28: 2 [IU] via SUBCUTANEOUS
  Administered 2018-02-28: 3 [IU] via SUBCUTANEOUS
  Administered 2018-03-01: 2 [IU] via SUBCUTANEOUS
  Administered 2018-03-02: 1 [IU] via SUBCUTANEOUS

## 2018-02-13 MED ORDER — ALUM & MAG HYDROXIDE-SIMETH 200-200-20 MG/5ML PO SUSP: 30.0000 mL | ORAL | Status: DC | PRN

## 2018-02-13 MED ORDER — INSULIN ASPART 100 UNIT/ML ~~LOC~~ SOLN: 0.0000 [IU] | Freq: Three times a day (TID) | SUBCUTANEOUS | 11 refills | Status: DC

## 2018-02-13 MED ORDER — MUSCLE RUB 10-15 % EX CREA
1.0000 "application " | TOPICAL_CREAM | CUTANEOUS | Status: DC | PRN
  Administered 2018-02-24: 1 via TOPICAL
  Filled 2018-02-13: qty 85

## 2018-02-13 MED ORDER — ACETAMINOPHEN 325 MG PO TABS: 650.0000 mg | ORAL_TABLET | ORAL | Status: DC | PRN

## 2018-02-13 MED ORDER — LEVOTHYROXINE SODIUM 75 MCG PO TABS: 75.0000 ug | ORAL_TABLET | Freq: Every day | ORAL | Status: DC

## 2018-02-13 MED ORDER — NITROGLYCERIN 0.4 MG SL SUBL
0.4000 mg | SUBLINGUAL_TABLET | SUBLINGUAL | Status: DC | PRN
  Filled 2018-02-13: qty 1

## 2018-02-13 MED ORDER — GUAIFENESIN-DM 100-10 MG/5ML PO SYRP
5.0000 mL | ORAL_SOLUTION | ORAL | Status: DC | PRN
  Filled 2018-02-13: qty 5

## 2018-02-13 MED ORDER — INFLUENZA VAC SPLIT HIGH-DOSE 0.5 ML IM SUSY
0.5000 mL | PREFILLED_SYRINGE | INTRAMUSCULAR | Status: AC
  Administered 2018-02-14: 0.5 mL via INTRAMUSCULAR
  Filled 2018-02-13: qty 0.5

## 2018-02-13 MED ORDER — ASPIRIN 325 MG PO TBEC: 325.0000 mg | DELAYED_RELEASE_TABLET | Freq: Every day | ORAL | 0 refills | Status: DC

## 2018-02-13 MED ORDER — ASPIRIN EC 325 MG PO TBEC
325.0000 mg | DELAYED_RELEASE_TABLET | Freq: Every day | ORAL | Status: DC
  Administered 2018-02-14: 325 mg via ORAL
  Filled 2018-02-13: qty 1

## 2018-02-13 MED ORDER — LIP MEDEX EX OINT: 1.0000 "application " | TOPICAL_OINTMENT | CUTANEOUS | Status: DC | PRN

## 2018-02-13 MED ORDER — ACETAMINOPHEN 325 MG PO TABS
650.0000 mg | ORAL_TABLET | Freq: Four times a day (QID) | ORAL | Status: DC | PRN
  Administered 2018-02-13 – 2018-02-15 (×4): 650 mg via ORAL
  Filled 2018-02-13 (×4): qty 2

## 2018-02-13 MED ORDER — LEVOTHYROXINE SODIUM 75 MCG PO TABS
75.0000 ug | ORAL_TABLET | Freq: Every day | ORAL | Status: DC
  Administered 2018-02-14 – 2018-03-02 (×15): 75 ug via ORAL
  Filled 2018-02-13 (×17): qty 1

## 2018-02-13 MED ORDER — BLISTEX MEDICATED EX OINT
TOPICAL_OINTMENT | CUTANEOUS | Status: DC | PRN
  Filled 2018-02-13: qty 6.3

## 2018-02-13 MED ORDER — POLYVINYL ALCOHOL 1.4 % OP SOLN
1.0000 [drp] | OPHTHALMIC | Status: DC | PRN
  Administered 2018-02-25 – 2018-02-28 (×4): 1 [drp] via OPHTHALMIC
  Filled 2018-02-13: qty 15

## 2018-02-13 MED ORDER — TRAZODONE HCL 50 MG PO TABS: 25.0000 mg | ORAL_TABLET | Freq: Every evening | ORAL | Status: DC | PRN

## 2018-02-13 NOTE — Progress Notes (Signed)
Jamse Arn, MD      Jamse Arn, MD  Physician  Physical Medicine and Rehabilitation      Consult Note  Signed     Date of Service:  02/12/2018  1:42 PM         Related encounter: ED to Hosp-Admission (Current) from 02/09/2018 in [facility]             Signed          Expand All Collapse All            Expand widget buttonCollapse widget button     Hide copied text   Hover for detailscustomization button                                                                                                                        untitled image              Physical Medicine and Rehabilitation Consult     Reason for Consult:Stroke with functional deficits.   Referring Physician: Dr. Erlinda Hong        HPI: [name] is a 83 y.o. female with history of CAD, chemo induced CM, prior stroke, dementia who was admitted on 02/09/18 with confusion and right sided weakness. CT head reviewed, unremarkable for acute intracranial process. Per report, MRI brain showing limited by motion artifact but showed possible acute early subacute infarcts in left thalamus and left parietal periventricular white matter. 2D echo showed EF 25% with septal,  Mild AS and apical akinesis of inferior wall. EEG done due to ongoing issues with confusion and agitation a and showed intermittent frontal slowing. Dr. Erlinda Hong recommends DOAC starting 9/93 due to embolic stroke --to repeat echo in 2-3 months and anticoagulation can be d/ced if EF > 35%. She was treated with IV antibiotics X 3 days due to concerns of UTI.  Patient with resultant global aphasia, bouts of lethargy with agitation, left gaze preference, right sided weakness affecting mobility and self care tasks. She is showing improvement in mentation  and CIR recommended for follow up therapy.      Review of Systems   Unable to perform ROS: Mental acuity           Past Medical History:    Diagnosis   Date    .   Atherosclerosis of autologous vein coronary artery bypass graft with unstable angina pectoris ([place])            CATH: 80% prox SVG-D1, 95% anastomotic SVG-D1 lesion    .   CAD (coronary artery disease), native coronary artery   2004        a) 5/'70: Complicated PCI-of pLAD wtih PTCA  (failed redo PTCA) --> CABG 06/2003 for ISR LAD & D1 (LIMA-LAD, SVG-dRCA, SVG-D1);; b) 4/'07: Patent grafts, Normal RHC pressures; c) NSTEMI 9/'10: No culprit;  1/'12: recanalized LAD stent, patent grafts    .   CAD S/P percutaneous coronary angioplasty   06/05/2014        a) 1/04: BMS x 2 LAD, PTCA D1 followed by unusuccessful redo PTCA D1 --> initially turned down CABG initially then CABG in 06/2003;; b) 05/2014: DES to Prox Cx & SVG-D1; PTCA of Anastomotic SVG-D1 lesion    .   Cardiomyopathy secondary to chemotherapy ([place])   07/2009        a) Combined Ischemic & Non-ischemic (Adriamycin from [facility])EF ~40% by Cath; by Echo in 08/2010 35-40%;; b) followup echo August 2013: EF 50-55%, aortic sclerosis without stenosis. Grade 1 diastolic dysfunction    .   Chronic combined systolic and diastolic congestive heart failure, NYHA class 2 ([place])   07/2009        Mixed. 2D-echocardiogram (08/2010) - 35% to 40%. Diffuse hypokinesis. Grade 2 diastolic dysfunction.    .   CKD (chronic kidney disease) stage 2, GFR 60-89 ml/min            BL Cr 1.6    .   Dementia        .   DJD (degenerative joint disease) of knee            left    .   DM (diabetes mellitus), type 2, uncontrolled with complications ([place])   DX: 2001        Stopped of Metformin in 04/2011 in setting of AKI (Cr 3), now on insulin.    .   Essential hypertension   03/12/2012    .   History of breast  cancer            Primary oncologist Dr. Brigitte Pulse. // First diagnosed 2000, recurrence in 06/2010. S/P left mastectomy with radiation therapy (2000), following recurrence, started chemothearpy, completed course (06/2010.)    .   History of CVA (cerebrovascular accident)   11/2009; 8& 04/2009        left frontal lobe stroke (11/2009), TIA (06/2009, 04/2009) with multiple prior TIAs manifested by aphasia. H/o CVA in 1988 involving right eye.    [name]   History of DVT of lower extremity   07/2003        in setting of saphenous vein grafting for CABG    .   History of renal artery stenosis   2010        Left 95% stenosis s/p left renal artery stenting (09/2009), right renal artery with 50% stenosis.    [name]   Hx of adenomatous colonic polyps            remote history. Last colonoscopy (03/2004) - Normal colonoscopy to the cecum, rec 5 year follow-up colonoscopy.    .   Hyperlipidemia with target LDL less than 70        .   Hypothyroidism        .   S/P CABG x 3   06/2003        LIMA-LAD, SVG-D1, SVG-dRCA                Past Surgical History:    Procedure   Laterality   Date    .   CARDIAC CATHETERIZATION       12/10/2002        Successful stenting of LAD with Cutting Balloon angioplasty of diagonal #1. There was a residual type B dissection in first diagonal branch.     [name]  CARDIAC CATHETERIZATION       01/14/2003        Ostial 80% narrowing of the LAD - procedure was terminated after 5 unsuccessful attempts to pass the balloon into the stents and down to the lesion.    [name]   CARDIAC CATHETERIZATION       07/22/2003        Recommended CABG revascularization    .   CARDIAC CATHETERIZATION       03/01/2006        Low, normal LV systolic function, normal pulmonary artery pressures, and patent grafts.    [name]   CARDIAC CATHETERIZATION       02/19/2008        Continue current  medications and treatment plan.    [name]   CARDIAC CATHETERIZATION       08/19/2009        Patent grafts with normal LV function    .   CARDIAC CATHETERIZATION       11/25/2010        40% EF with anterior and distal inferior wall motion abnormalities. Patient grafts. Progression of diagonal and LAD disease.    [name]   CARDIOVASCULAR STRESS TEST       09/30/2009        No scintigraphic evidence of inducible myocardial ischemia. No ECG changes. EKG negative for ischemia.    .   CORONARY ARTERY BYPASS GRAFT       07/23/2003        x3. LIMA to LAD, SVG to diagonal, SVG to RCA.    [name]   LEFT HEART CATHETERIZATION WITH CORONARY/GRAFT ANGIOGRAM   N/A   06/05/2014        Procedure: LEFT HEART CATHETERIZATION WITH Beatrix Fetters;  Surgeon: Leonie Man, MD;  Location[facility] C[facility] LAB;  Service: Cardiovascular;  Laterality: N/A;    .   LUMBAR [place]            .   RENAL ARTERY ANGIOPLASTY   Left   10/05/2009        95% renal artery stenosis. Predilated with a 4x15 Aviator balloon, stented with a 5x15 Genesis and Aviator balloon stent with dilatatin at 10 atm. Resulting in reduction of 95% to 0% with excellent flow.    [name]   RENAL DOPPLER       10/23/2011        Bilateral Renal Artery - No evidence of significant diameter reduction, tortuosity, or any other vascular abnormality/    .   SIMPLE MASTECTOMY       2000        left, in setting of breast cancer    .   TOTAL ABDOMINAL HYSTERECTOMY            .   TOTAL KNEE ARTHROPLASTY       12/2004, 12/2005        BL, right (2006) by Dr. Berenice Primas. Left (2007) by Dr. Ronnie Derby.    .   TRANSTHORACIC ECHOCARDIOGRAM       03/13/2012        EF 50-55%, mild focal basal hypertrophy of the septum. Abnormal LV relaxation (grade 1 diastolic dysfunction).                Family History    Problem   Relation   Age of Onset    .    Heart disease   Mother        .   Diabetes  Mother        .   Heart disease   Father        .   Cancer   Father                colon cancer    .   Heart disease   Sister        .   Heart disease   Brother        .   Cancer   Brother                prostate cancer    .   Heart disease   Brother        .   Cancer   Sister                breast cancer          Social History:  Married. Lives with husband (frail per reports). [place]. A neighbor checks in daily?  Per reports  she has never smoked. She has never used smokeless tobacco. Per reports  she does not drink alcohol or use drugs.               Allergies    Allergen   Reactions    .   Brilinta [Ticagrelor]   Shortness Of Breath    .   Plavix [Clopidogrel Bisulfate]   Other (See Comments)            NOT A RESPONDER- P2Y12 ASSAY WAS 224 (09/02/14)    .   Statins   Other (See Comments)            Lethargy, muscle pain              Medications Prior to Admission    Medication   Sig   Dispense   Refill    .   aspirin EC 81 MG EC tablet   Take 1 tablet (81 mg total) by mouth daily.            .   furosemide (LASIX) 20 MG tablet   Take 20 mg by mouth daily as needed for fluid or edema.            [name]   ibuprofen (ADVIL,MOTRIN) 200 MG tablet   Take 600 mg by mouth daily as needed for headache.            [name]   LANTUS SOLOSTAR 100 UNIT/ML Solostar Pen   Inject 80 Units into the skin every morning. Taper up to 110 units as directed until fasting glucose <130       5    .   metFORMIN (GLUCOPHAGE-XR) 500 MG 24 hr tablet   Take 1,000 mg by mouth every evening.        5    .   metoprolol succinate (TOPROL-XL) 25 MG 24 hr tablet   Take 25 mg by mouth daily.       0    .   nitroGLYCERIN (NITROSTAT) 0.4 MG SL tablet    Place 1 tablet (0.4 mg total) under the tongue every 5 (five) minutes x 3 doses as needed for chest pain.   25 tablet   2    .   SYNTHROID 100 MCG tablet   Take 100 mcg by mouth daily.             .   clopidogrel (PLAVIX) 75 MG  tablet   Take 1 tablet (75 mg total) by mouth daily. CALL FOR APPT FOR FURTHER REFILLS, THANKS! (Patient not taking: Reported on 02/09/2018)   15 tablet   0    .   pantoprazole (PROTONIX) 40 MG tablet   Take 1 tablet (40 mg total) by mouth daily. (Patient not taking: Reported on 08/04/2015)   30 tablet   11          Home:  Home Living  Family/patient expects to be discharged to:: Skilled nursing facility  Additional Comments: Spoke to family after evaluation - pt lived at home with family and was able to hold a converstaion with her family but frequently repeated herself. Pt ableto complete her own aDL tasks and was independent with mobility.    Functional History:  Prior Function  Level of Independence: Independent  Comments: see above  Functional Status:   Mobility:  Bed Mobility  Overal bed mobility: Needs Assistance  Bed Mobility: Rolling, Sidelying to Sit  Rolling: Min assist  Sidelying to sit: Mod assist, HOB elevated  Sit to supine: Total assist, +2 for physical assistance  General bed mobility comments: Able to initiate movement of BLEs and use of BUEs to get to EOB. RUE inattention noted.   Transfers  Overall transfer level: Needs assistance  Equipment used: 2 person hand held assist  Transfers: Sit to/from Stand  Sit to Stand: Mod assist, From elevated surface  General transfer comment: Assist to power to standing with cues for upright posture and hip/back extension. Transferred to chair post ambulation.  Ambulation/Gait  Ambulation/Gait assistance: +2 physical assistance, Min assist  Ambulation Distance (Feet): 7 Feet  Assistive device: 2 person hand held assist  Gait  Pattern/deviations: Step-to pattern, Step-through pattern, Decreased dorsiflexion - right, Decreased step length - right, Decreased stance time - right, Trunk flexed  General Gait Details: Slow, unsteady gait with RUE extensor tone, decreased foot clearance right foot esp when fatigued. Short step lengths bilaterally.   Gait velocity: decreased       ADL:  ADL  Overall ADL's : Needs assistance/impaired  Functional mobility during ADLs: Total assistance, +2 for physical assistance  General ADL Comments: total A     Cognition:  Cognition  Overall Cognitive Status: Difficult to assess  Arousal/Alertness: Lethargic  Orientation Level: Oriented to person, Disoriented to place, Disoriented to time, Disoriented to situation  Attention: Selective  Selective Attention: Impaired  Selective Attention Impairment: Verbal basic, Functional basic  Memory: (difficult to assess d/t aphasia)  Awareness: Impaired  Awareness Impairment: Emergent impairment, Anticipatory impairment  Cognition  Arousal/Alertness: Lethargic, Suspect due to medications  Behavior During Therapy: WFL for tasks assessed/performed  Overall Cognitive Status: Difficult to assess  Area of Impairment: Attention, Following commands, Problem solving  Current Attention Level: Focused  Following Commands: Follows one step commands inconsistently, Follows one step commands with increased time  Problem Solving: Requires verbal cues, Requires tactile cues  General Comments: Follows simple 1 step commands inconsistently with increased time and gestural cues. Lethargic towards middle-end of session secondary to being given Haldol early this AM. Right hemi body inattention noted. Able to attend to right visual field but prefers left gaze preference.   Difficult to assess due to: Impaired communication     Blood pressure 136/84, pulse 87, temperature 98.7 F (37.1 C), temperature source Axillary, resp. rate  16, weight 59.8 kg (131 lb 13.4 oz), SpO2 97 %.  Physical Exam   Nursing note and vitals reviewed.  Constitutional: She appears  well-developed and well-nourished.   HENT:   Head: Normocephalic and atraumatic.   Mouth/Throat: Oropharynx is clear and moist.   Eyes: Pupils are equal, round, and reactive to light. Conjunctivae and EOM are normal.   Neck: Normal range of motion. Neck supple.   Cardiovascular: Normal rate and regular rhythm.   Respiratory: Effort normal and breath sounds normal. No stridor. No respiratory distress. She has no wheezes.   GI: Soft. Bowel sounds are normal. She exhibits no distension.  Musculoskeletal: She exhibits no edema or tenderness.  Neurological: She is alert.  Right inattention with difficulty moving eyes to right field. Gibberish speech with inability to follow simple commands.  MMT limited due to global aphasia.  Spontaneously moving LUE and slightly moving RUE, no movement noted in b/l LE, however, moving per family.   Skin: Skin is warm and dry.  Psychiatric: Her affect is inappropriate. Cognition and memory are impaired. She expresses impulsivity.         Lab Results Last 24 Hours  Imaging Results (Last 48 hours)                              Assessment/Plan:  Diagnosis: Infarcts in left thalamus and left parietal periventricular white matter  Labs and images independently reviewed.  Records reviewed and summated above.  Stroke:  Continue secondary stroke prophylaxis and Risk Factor Modification listed below:    Antiplatelet therapy:    Blood Pressure Management:  Continue current medication with prn's with permisive HTN per primary team  Statin Agent:    Diabetes management:       1.Does the need for close, 24 hr/day medical supervision in concert with the patient's rehab needs make it unreasonable for this patient to be served in a less intensive setting? Yes    2.Co-Morbidities requiring supervision/potential complications: CAD (cont meds), chemo induced CM, prior stroke, dementia, UTI (completed abx), DM (Monitor in accordance with exercise and adjust meds as necessary), hypothyroidism (cont meds, ensure appropriate mood and energy level for therapies), agitation (limit medications as much as possible), AKI (avoid nephrotoxic meds)   3.Due to safety, disease management, medication administration and patient education, does the patient require 24 hr/day rehab nursing? Yes   4.Does the patient require coordinated care of a physician, rehab nurse, PT (1-2 hrs/day, 5 days/week), OT (1-2 hrs/day, 5 days/week) and SLP (1-2 hrs/day, 5 days/week) to address physical and functional deficits in the context of the above medical diagnosis(es)? Yes Addressing deficits in the following areas: balance, endurance, locomotion, strength, transferring, bowel/bladder control, bathing, dressing, grooming, toileting, cognition, speech, language and psychosocial support   5.Can the patient actively participate in an intensive therapy  program of at least 3 hrs of therapy per day at least 5 days per week? Yes   6.The potential for patient to make measurable gains while on inpatient rehab is excellent   7.Anticipated functional outcomes upon discharge from inpatient rehab are supervision  with PT, supervision with OT, min assist and mod assist with SLP.   8.Estimated rehab length of stay to reach the above functional goals is: 19-23 days.   9.Anticipated D/C setting: Home   10.Anticipated post D/C treatments: HH therapy and Home excercise program   11.Overall Rehab/Functional Prognosis: good      RECOMMENDATIONS:  This patient's condition is appropriate for continued rehabilitative care in the following setting: CIR if caregiver support available upon discharge when medically stable.   Patient has agreed to participate in recommended program. Potentially  Note that insurance prior authorization may be required for reimbursement for recommended care.     Comment: Rehab Admissions Coordinator to follow up.     Delice Lesch, MD, ABPMR  Bary Leriche, PA-C  02/12/2018                Revision History                                        Routing History

## 2018-02-13 NOTE — Progress Notes (Signed)
Pt having freq. PVCs. MD notified. Asymptomatic, so OK to go off telemetry for transfer to CIR

## 2018-02-13 NOTE — H&P (Signed)
Physical Medicine and Rehabilitation Admission H&P     Chief Complaint  Patient presents with  . Stroke with functional deficits.   HPI: [name] is a 83 y.o. female with history of CAD, chemo induced CM, prior stroke, dementia who was admitted on 02/09/18 with confusion and right sided weakness. CT head reviewed, unremarkable for acute intracranial process. Per report, MRI brain showing limited by motion artifact but showed possible acute early subacute infarcts in left thalamus and left parietal periventricular white matter. 2D echo showed EF 25% with septal, Mild AS and apical akinesis of inferior wall. EEG done due to ongoing issues with confusion and agitation a and showed intermittent frontal slowing. Dr. Erlinda Hong recommended starting [place] on 3/26 as not a candidate for ASA/Brillinta ---to repeat echo in 2-3 months and anticoagulation can be d/ced if EF > 35%. BLE dopplers were negative for DVT. Family trying wanted to wait starting DOAC.  She was treated with IV antibiotics X 3 days due to concerns of UTI. Has required sitters due to fall risk and safety concerns. Has issues with day night disruption. Palliative care consulted for [place] and family elected on DNR. Patient with resultant global aphasia, bouts of lethargy with agitation, left gaze preference, right sided weakness affecting mobility and self care tasks. CIR recommended due to functional deficits.  Review of Systems  Unable to perform ROS: Mental acuity       Past Medical History:  Diagnosis Date  . Atherosclerosis of autologous vein coronary artery bypass graft with unstable angina pectoris ([place])    CATH: 80% prox SVG-D1, 95% anastomotic SVG-D1 lesion  . CAD (coronary artery disease), native coronary artery 2004   a) 6/'27: Complicated PCI-of pLAD wtih PTCA (failed redo PTCA) --> CABG 06/2003 for ISR LAD & D1 (LIMA-LAD, SVG-dRCA, SVG-D1);; b) 4/'07: Patent grafts, Normal RHC pressures; c) NSTEMI 9/'10: No culprit; 1/'12:  recanalized LAD stent, patent grafts  . CAD S/P percutaneous coronary angioplasty 06/05/2014   a) 1/04: BMS x 2 LAD, PTCA D1 followed by unusuccessful redo PTCA D1 --> initially turned down CABG initially then CABG in 06/2003;; b) 05/2014: DES to Prox Cx & SVG-D1; PTCA of Anastomotic SVG-D1 lesion  . Cardiomyopathy secondary to chemotherapy ([place]) 07/2009   a) Combined Ischemic & Non-ischemic (Adriamycin from [facility])EF ~40% by Cath; by Echo in 08/2010 35-40%;; b) followup echo August 2013: EF 50-55%, aortic sclerosis without stenosis. Grade 1 diastolic dysfunction  . Chronic combined systolic and diastolic congestive heart failure, NYHA class 2 ([place]) 07/2009   Mixed. 2D-echocardiogram (08/2010) - 35% to 40%. Diffuse hypokinesis. Grade 2 diastolic dysfunction.  . CKD (chronic kidney disease) stage 2, GFR 60-89 ml/min    BL Cr 1.6  . Dementia   . DJD (degenerative joint disease) of knee    left  . DM (diabetes mellitus), type 2, uncontrolled with complications ([place]) DX: 0350   Stopped of Metformin in 04/2011 in setting of AKI (Cr 3), now on insulin.  . Essential hypertension 03/12/2012  . History of breast cancer    Primary oncologist Dr. Brigitte Pulse. // First diagnosed 2000, recurrence in 06/2010. S/P left mastectomy with radiation therapy (2000), following recurrence, started chemothearpy, completed course (06/2010.)  . History of CVA (cerebrovascular accident) 11/2009; 8& 04/2009   left frontal lobe stroke (11/2009), TIA (06/2009, 04/2009) with multiple prior TIAs manifested by aphasia. H/o CVA in 1988 involving right eye.  [name] History of DVT of lower extremity 07/2003   in setting of saphenous vein  grafting for CABG  . History of renal artery stenosis 2010   Left 95% stenosis s/p left renal artery stenting (09/2009), right renal artery with 50% stenosis.  [name] Hx of adenomatous colonic polyps    remote history. Last colonoscopy (03/2004) - Normal colonoscopy to the cecum, rec 5 year follow-up  colonoscopy.  . Hyperlipidemia with target LDL less than 70   . Hypothyroidism   . S/P CABG x 3 06/2003   LIMA-LAD, SVG-D1, SVG-dRCA        Past Surgical History:  Procedure Laterality Date  . CARDIAC CATHETERIZATION  12/10/2002   Successful stenting of LAD with Cutting Balloon angioplasty of diagonal #1. There was a residual type B dissection in first diagonal branch.   . CARDIAC CATHETERIZATION  01/14/2003   Ostial 80% narrowing of the LAD - procedure was terminated after 5 unsuccessful attempts to pass the balloon into the stents and down to the lesion.  [name] CARDIAC CATHETERIZATION  07/22/2003   Recommended CABG revascularization  . CARDIAC CATHETERIZATION  03/01/2006   Low, normal LV systolic function, normal pulmonary artery pressures, and patent grafts.  [name] CARDIAC CATHETERIZATION  02/19/2008   Continue current medications and treatment plan.  [name] CARDIAC CATHETERIZATION  08/19/2009   Patent grafts with normal LV function  . CARDIAC CATHETERIZATION  11/25/2010   40% EF with anterior and distal inferior wall motion abnormalities. Patient grafts. Progression of diagonal and LAD disease.  [name] CARDIOVASCULAR STRESS TEST  09/30/2009   No scintigraphic evidence of inducible myocardial ischemia. No ECG changes. EKG negative for ischemia.  . CORONARY ARTERY BYPASS GRAFT  07/23/2003   x3. LIMA to LAD, SVG to diagonal, SVG to RCA.  [name] LEFT HEART CATHETERIZATION WITH CORONARY/GRAFT ANGIOGRAM N/A 06/05/2014   Procedure: LEFT HEART CATHETERIZATION WITH Beatrix Fetters; Surgeon: Leonie Man, MD; Location: [facility] CATH LAB; Service: Cardiovascular; Laterality: N/A;  . LUMBAR [place]    . RENAL ARTERY ANGIOPLASTY Left 10/05/2009   95% renal artery stenosis. Predilated with a 4x15 Aviator balloon, stented with a 5x15 Genesis and Aviator balloon stent with dilatatin at 10 atm. Resulting in reduction of 95% to 0% with excellent flow.  [name] RENAL DOPPLER  10/23/2011   Bilateral Renal Artery - No evidence of  significant diameter reduction, tortuosity, or any other vascular abnormality/  . SIMPLE MASTECTOMY  2000   left, in setting of breast cancer  . TOTAL ABDOMINAL HYSTERECTOMY    . TOTAL KNEE ARTHROPLASTY  12/2004, 12/2005   BL, right (2006) by Dr. Berenice Primas. Left (2007) by Dr. Ronnie Derby.  . TRANSTHORACIC ECHOCARDIOGRAM  03/13/2012   EF 50-55%, mild focal basal hypertrophy of the septum. Abnormal LV relaxation (grade 1 diastolic dysfunction).        Family History  Problem Relation Age of Onset  . Heart disease Mother   . Diabetes Mother   . Heart disease Father   . Cancer Father    colon cancer  . Heart disease Sister   . Heart disease Brother   . Cancer Brother    prostate cancer  . Heart disease Brother   . Cancer Sister    breast cancer   Social History: Married. Lives with husband (frail per reports). [place]. A neighbor checks in daily? Per reports she has never smoked. She has never used smokeless tobacco. Per reports she does not drink alcohol or use drugs.       Allergies  Allergen Reactions  . Brilinta [Ticagrelor] Shortness Of Breath  .  Plavix [Clopidogrel Bisulfate] Other (See Comments)    NOT A RESPONDER- P2Y12 ASSAY WAS 224 (09/02/14)  . Statins Other (See Comments)    Lethargy, muscle pain         Medications Prior to Admission  Medication Sig Dispense Refill  . aspirin EC 81 MG EC tablet Take 1 tablet (81 mg total) by mouth daily.    . furosemide (LASIX) 20 MG tablet Take 20 mg by mouth daily as needed for fluid or edema.    [name] ibuprofen (ADVIL,MOTRIN) 200 MG tablet Take 600 mg by mouth daily as needed for headache.    [name] LANTUS SOLOSTAR 100 UNIT/ML Solostar Pen Inject 80 Units into the skin every morning. Taper up to 110 units as directed until fasting glucose <130  5  . metFORMIN (GLUCOPHAGE-XR) 500 MG 24 hr tablet Take 1,000 mg by mouth every evening.   5  . metoprolol succinate (TOPROL-XL) 25 MG 24 hr tablet Take 25 mg by mouth daily.   0  . nitroGLYCERIN (NITROSTAT) 0.4 MG SL tablet Place 1 tablet (0.4 mg total) under the tongue every 5 (five) minutes x 3 doses as needed for chest pain. 25 tablet 2  . SYNTHROID 100 MCG tablet Take 100 mcg by mouth daily.     . clopidogrel (PLAVIX) 75 MG tablet Take 1 tablet (75 mg total) by mouth daily. CALL FOR APPT FOR FURTHER REFILLS, THANKS! (Patient not taking: Reported on 02/09/2018) 15 tablet 0  . pantoprazole (PROTONIX) 40 MG tablet Take 1 tablet (40 mg total) by mouth daily. (Patient not taking: Reported on 08/04/2015) 30 tablet 11   Drug Regimen Review  Drug regimen was reviewed and remains appropriate with no significant issues identified  Home:  Home Living  Family/patient expects to be discharged to:: Skilled nursing facility  Additional Comments: Spoke to family after evaluation - pt lived at home with family and was able to hold a converstaion with her family but frequently repeated herself. Pt ableto complete her own aDL tasks and was independent with mobility.  Functional History:  Prior Function  Level of Independence: Independent  Comments: see above  Functional Status:  Mobility:  Bed Mobility  Overal bed mobility: Needs Assistance  Bed Mobility: Rolling, Sidelying to Sit  Rolling: Min assist  Sidelying to sit: Mod assist, HOB elevated  Sit to supine: Total assist, +2 for physical assistance  General bed mobility comments: Able to initiate movement of BLEs and use of BUEs to get to EOB. RUE inattention noted.  Transfers  Overall transfer level: Needs assistance  Equipment used: 2 person hand held assist  Transfers: Sit to/from Stand  Sit to Stand: Mod assist, From elevated surface  General transfer comment: Assist to power to standing with cues for upright posture and hip/back extension. Transferred to chair post ambulation.  Ambulation/Gait  Ambulation/Gait assistance: +2 physical assistance, Min assist  Ambulation Distance (Feet): 7 Feet  Assistive device: 2  person hand held assist  Gait Pattern/deviations: Step-to pattern, Step-through pattern, Decreased dorsiflexion - right, Decreased step length - right, Decreased stance time - right, Trunk flexed  General Gait Details: Slow, unsteady gait with RUE extensor tone, decreased foot clearance right foot esp when fatigued. Short step lengths bilaterally.  Gait velocity: decreased   ADL:  ADL  Overall ADL's : Needs assistance/impaired  Functional mobility during ADLs: Total assistance, +2 for physical assistance  General ADL Comments: total A  Cognition:  Cognition  Overall Cognitive Status: Difficult to assess  Arousal/Alertness: Lethargic  Orientation Level: Disoriented X4(mumbles word)  Attention: Selective  Selective Attention: Impaired  Selective Attention Impairment: Verbal basic, Functional basic  Memory: (difficult to assess d/t aphasia)  Awareness: Impaired  Awareness Impairment: Emergent impairment, Anticipatory impairment  Cognition  Arousal/Alertness: Lethargic, Suspect due to medications  Behavior During Therapy: WFL for tasks assessed/performed  Overall Cognitive Status: Difficult to assess  Area of Impairment: Attention, Following commands, Problem solving  Current Attention Level: Focused  Following Commands: Follows one step commands inconsistently, Follows one step commands with increased time  Problem Solving: Requires verbal cues, Requires tactile cues  General Comments: Follows simple 1 step commands inconsistently with increased time and gestural cues. Lethargic towards middle-end of session secondary to being given Haldol early this AM. Right hemi body inattention noted. Able to attend to right visual field but prefers left gaze preference.  Difficult to assess due to: Impaired communication  Blood pressure 140/71, pulse 64, temperature 98.1 F (36.7 C), temperature source Axillary, resp. rate 18, weight 59.8 kg (131 lb 13.4 oz), SpO2 96 %.  Physical Exam  Nursing  note and vitals reviewed.  Constitutional: She appears well-developed and well-nourished. No distress.  HENT:  Head: Normocephalic and atraumatic.  Mouth/Throat: Oropharynx is clear and moist.  Eyes: Pupils are equal, round, and reactive to light. Conjunctivae are normal.  Left gaze preference  Neck: Normal range of motion. Neck supple.  Cardiovascular: Normal rate and regular rhythm.  Murmur heard.  Respiratory: Effort normal and breath sounds normal. No stridor. No respiratory distress. She has no wheezes.  GI: Soft. Bowel sounds are normal. She exhibits no distension. There is no tenderness.  Musculoskeletal: She exhibits no edema or tenderness.  Neurological: She is alert.  Mild right facial weakness noted. Left gaze preference and had difficulty moving eyes to right field. Right visual field deficits. Globally aphasic with gibberish output. Receptive abilities appear better than receptive function. Unable to point. Able to recognize name and family members. She needed visual and tactile cues to move limbs. Unable to perform MMT but moves RUE and RLE grossly 2 to 2+/5. LUE and LLE grossly 4-5/5. Withdraws to pain RUE and RLE.  Skin: Skin is warm and dry. She is not diaphoretic.  Psychiatric: Her affect is inappropriate. She is slowed. Cognition and memory are impaired. She expresses inappropriate judgment. She is inattentive.   Lab Results Last 48 Hours  Imaging Results (Last 48 hours)     Medical Problem List and Plan:  1. Functional deficits secondary to infarcts in left thalamus and left parietal periventricular white matter  -admit to inpatient rehab  2. DVT Prophylaxis/Anticoagulation: Pharmaceutical: Lovenox and Other (comment)  3. Pain Management: tylenol prn  4. Mood: Patient currently with poor awareness of deficits. LCSW to follow for evaluation when appropriate.  -pt has demonstrating increased confusion and agitation at night, poor sleep patterns  -check sleep chart  -begin scheduled, low dose seroquel  5. Neuropsych: This patient is not capable of making decisions on her own behalf.  6. Skin/Wound Care: routine pressure relief measures  7. Fluids/Electrolytes/Nutrition: Monitor I/O. Offer supplements if intake poor.  8. Chronic combined systolic/diastolic CHF/chemo induced CM: Heart healthy diet. Monitor for signs of overload and check weights daily. BB discontinued due to bradycardia. Continue Lisinopril daily. Used lasix prn edema at home.  9. CAD w/p PTCA: On ASA and Zetia.  10. CKD: Avoid nephrotoxic medications. Monitor with serial checks.  11.T2DM: Hgb A1C- 8.6. Blood sugars poorly controlled.--Used Lantus 80 units with metformin at nights. Will resume Lantus 10 units daily and titrate as needed. Monitor BS ac/hs.  12. Hypothyroid: On supplement.     Post Admission Physician Evaluation:  1. Functional deficits secondary to left thalamus and PVWM infarcts. 2. Patient is admitted to receive collaborative, interdisciplinary care between the physiatrist, rehab nursing staff, and therapy team. 3. Patient's level of medical complexity and substantial therapy needs in context of that medical necessity cannot be provided at a lesser intensity of care such as a SNF. 4. Patient  has experienced substantial functional loss from his/her baseline which was documented above under the "Functional History" and "Functional Status" headings. Judging by the patient's diagnosis, physical exam, and functional history, the patient has potential for functional progress which will result in measurable gains while on inpatient rehab. These gains will be of substantial and practical use upon discharge in facilitating mobility and self-care at the household level. 5. Physiatrist will provide 24 hour management of medical needs as well as oversight of the therapy plan/treatment and provide guidance as appropriate regarding the interaction of the two. 6. The Preadmission Screening has been reviewed and patient status is unchanged unless otherwise stated above. 7. 24 hour rehab nursing will assist with bladder management, bowel management, safety, skin/wound care, disease management, medication administration, pain management and patient education and help integrate therapy concepts, techniques,education, etc. 8. PT will assess and treat for/with: Lower extremity strength, range of motion, stamina, balance, functional mobility, safety, adaptive techniques and equipment, NMR, family education, cognitive perceptual awareness. Goals are: supervision to min assist. 9. OT will assess and treat for/with: ADL's, functional mobility, safety, upper extremity strength, adaptive techniques and equipment, NMR, family education, cognitive perceptual awareness. Goals are: supervision to min assist. Therapy may proceed with showering this patient. 10. SLP will assess and treat for/with: cognition, communication, family education. Goals are: supervision to mod + assist. 11. Case Management and Social Worker will assess and treat for psychological issues and discharge planning. 12. Team conference will be held weekly to assess progress toward goals and to determine barriers to discharge. 13. Patient will receive at  least 3 hours of therapy per day at least 5 days per week. 14. ELOS: 20-24 days  15. Prognosis: excellent   Meredith Staggers, MD, [facility]  02/13/2018  Bary Leriche, PA-C  02/13/2018

## 2018-02-13 NOTE — Progress Notes (Signed)
I have insurance approval to admit pt to inpt rehab today. I met with daughter, [name], at bedside and she is in agreement to admit. I will contact Primary MD team to make the arrangements to admit today. RN CM made aware. [phone]

## 2018-02-13 NOTE — PMR Pre-admission (Addendum)
PMR Admission Coordinator Pre-Admission Assessment  Patient: [name] is an 83 y.o., female MRN: [id number] DOB: 01-Oct-1935 Height:   Weight: 59.8 kg (131 lb 13.4 oz)              Insurance Information HMO: yes    PPO:      PCP:      IPA:      80/20:      OTHER: Medicare advantage plan PRIMARY: [place] [employer]      Policy#: [id number]      Subscriber: pt CM Name: Jenny Reichmann      Phone#: [phone]     Fax#: [phone] Pre-Cert#: [id number]    Approved for 7 days with f/u with jill  Employer: retired Benefits:  Phone #: [phone]     Name: 02/12/18 Eff. Date: 11/21/2017     Deduct: none      Out of Pocket Max: $4400      Life Max: none CIR: $345 co pay per day days 1 to 5      SNF: no co pay days 1 until 20; $160 copy per day days 21-48; no co pay days 49 to 100 Outpatient: $40 co pay per visit     Co-Pay: visits per medical neccesity Home Health: 100%      Co-Pay: visits per medical neccesity DME: 80%     Co-Pay: 20% Providers: in network  SECONDARY: none      Medicaid Application Date:       Case Manager:  Disability Application Date:       Case Worker:   Emergency Contact Information Contact Information    Name Relation Home Work Mobile   [place] Daughter [phone]  [phone]   Mireille, [place] [phone]     [name] Daughter   [phone]     Current Medical History  Patient Admitting Diagnosis: left thalamic and left parietal periventricular white matter History of Present Illness:  HPI: [name] a 83 y.o.femalewith history of CAD, chemo induced CM, prior stroke, dementiawho was admitted on 02/09/18 with confusion and right sided weakness. CT head reviewed, unremarkable for acute intracranial process. Per report,MRI brainshowinglimited by motion artifact but showed possible acute early subacute infarcts in left thalamus and left parietal periventricular white matter. 2D echo showed EF 25% with septal, Mild AS and apical akinesis  of inferior wall. EEG done due to ongoing issues with confusion and agitation a and showed intermittent frontal slowing. Dr. Erlinda Hong recommended starting DOAC on 3/26 as not a candidate for ASA/Brillinta ---to repeat echo in 2-3 months and anticoagulation can be d/cedif EF >35%. BLE dopplers were negative for DVT.   She was treated with IV antibiotics X 3 days due to concerns of UTI. Has required sitters due to fall risk and safety concerns. Patient with resultant global aphasia, bouts of lethargy with agitation, left gaze preference, right sided weakness affecting mobility and self care tasks.   Patient has had issues with increased confusion at night and received Haldol prn, but this makes her more lethargic during the day. Daughter, [name], states that pt's dementia more with having to repeat herself, but sundowning and agitation has not been an issue at home.  Total: 6 NIHSS    Past Medical History  Past Medical History:  Diagnosis Date  . Atherosclerosis of autologous vein coronary artery bypass graft with unstable angina pectoris ([place])    CATH: 80% prox SVG-D1, 95% anastomotic SVG-D1 lesion  . CAD (coronary artery disease), native coronary artery  2004   a) 6/'38: Complicated PCI-of pLAD wtih PTCA  (failed redo PTCA) --> CABG 06/2003 for ISR LAD & D1 (LIMA-LAD, SVG-dRCA, SVG-D1);; b) 4/'07: Patent grafts, Normal RHC pressures; c) NSTEMI 9/'10: No culprit; 1/'12: recanalized LAD stent, patent grafts  . CAD S/P percutaneous coronary angioplasty 06/05/2014   a) 1/04: BMS x 2 LAD, PTCA D1 followed by unusuccessful redo PTCA D1 --> initially turned down CABG initially then CABG in 06/2003;; b) 05/2014: DES to Prox Cx & SVG-D1; PTCA of Anastomotic SVG-D1 lesion  . Cardiomyopathy secondary to chemotherapy ([place]) 07/2009   a) Combined Ischemic & Non-ischemic (Adriamycin from [facility])EF ~40% by Cath; by Echo in 08/2010 35-40%;; b) followup echo August 2013: EF 50-55%, aortic sclerosis without stenosis.  Grade 1 diastolic dysfunction  . Chronic combined systolic and diastolic congestive heart failure, NYHA class 2 ([place]) 07/2009   Mixed. 2D-echocardiogram (08/2010) - 35% to 40%. Diffuse hypokinesis. Grade 2 diastolic dysfunction.  . CKD (chronic kidney disease) stage 2, GFR 60-89 ml/min    BL Cr 1.6  . Dementia   . DJD (degenerative joint disease) of knee    left  . DM (diabetes mellitus), type 2, uncontrolled with complications ([place]) DX: [id number]   Stopped of Metformin in 04/2011 in setting of AKI (Cr 3), now on insulin.  . Essential hypertension 03/12/2012  . History of breast cancer    Primary oncologist Dr. Brigitte Pulse. // First diagnosed 2000, recurrence in 06/2010. S/P left mastectomy with radiation therapy (2000), following recurrence, started chemothearpy, completed course (06/2010.)  . History of CVA (cerebrovascular accident) 11/2009; 8& 04/2009   left frontal lobe stroke (11/2009), TIA (06/2009, 04/2009) with multiple prior TIAs manifested by aphasia. H/o CVA in 1988 involving right eye.  [name] History of DVT of lower extremity 07/2003   in setting of saphenous vein grafting for CABG  . History of renal artery stenosis 2010   Left 95% stenosis s/p left renal artery stenting (09/2009), right renal artery with 50% stenosis.  [name] Hx of adenomatous colonic polyps    remote history. Last colonoscopy (03/2004) - Normal colonoscopy to the cecum, rec 5 year follow-up colonoscopy.  . Hyperlipidemia with target LDL less than 70   . Hypothyroidism   . S/P CABG x 3 06/2003   LIMA-LAD, SVG-D1, SVG-dRCA    Family History  family history includes Cancer in her brother, father, and sister; Diabetes in her mother; Heart disease in her brother, brother, father, mother, and sister.  Prior Rehab/Hospitalizations:  Has the patient had major surgery during 100 days prior to admission? No  Has only had Home health in the past, never SNF or AIR.  Current Medications   Current Facility-Administered Medications:   .  acetaminophen (TYLENOL) tablet 650 mg, 650 mg, Oral, Q4H PRN **OR** acetaminophen (TYLENOL) solution 650 mg, 650 mg, Per Tube, Q4H PRN, 650 mg at 02/11/18 1559 **OR** acetaminophen (TYLENOL) suppository 650 mg, 650 mg, Rectal, Q4H PRN, Bland, Scott, DO, 650 mg at 02/10/18 2000 .  aspirin EC tablet 325 mg, 325 mg, Oral, Daily, 325 mg at 02/13/18 1055 **OR** aspirin suppository 300 mg, 300 mg, Rectal, Daily, Rosalin Hawking, MD, 300 mg at 02/10/18 1557 .  dextrose 5 %-0.45 % sodium chloride infusion, , Intravenous, Continuous, Diallo, Abdoulaye, MD, Stopped at 02/10/18 0100 .  ezetimibe (ZETIA) tablet 10 mg, 10 mg, Oral, Daily, Rosalin Hawking, MD, 10 mg at 02/13/18 1055 .  insulin aspart (novoLOG) injection 0-9 Units, 0-9 Units, Subcutaneous, TID WC, Bland, Scott, DO,  2 Units at 02/13/18 9381 .  levothyroxine (SYNTHROID, LEVOTHROID) tablet 75 mcg, 75 mcg, Oral, QAC breakfast, Everrett Coombe, MD, 75 mcg at 02/13/18 1055 .  lisinopril (PRINIVIL,ZESTRIL) tablet 5 mg, 5 mg, Oral, Daily, Dorothy Spark, MD, 5 mg at 02/13/18 1055  Patients Current Diet: Fall precautions Diet regular Room service appropriate? Yes; Fluid consistency: Thin  Precautions / Restrictions Precautions Precautions: Fall Restrictions Weight Bearing Restrictions: No   Has the patient had 2 or more falls or a fall with injury in the past year?No  Prior Activity Level Community (5-7x/wk): Has not drivin car highway in a few years; uses [employer] on fram property and drives her Chrysler cruiser between homes on their farm property    Patient and spouse live on farm property with children having homes on the same property. Patient has to repeat herself at times, but independent with all adls. Daughter, [name], manages pt's pill box for meds and calls her to remind of when to take these meds. Pt is a diabetic, but uses insulin pen and self injects her insulin. [name] checks her CBG's periodically. Spouse cooks. Patient does drive a Photographer on their property and her [place] car on the property to visit family. Does not drive on highway.  Home Assistive Devices / Equipment none  Prior Device Use: Indicate devices/aids used by the patient prior to current illness, exacerbation or injury? None of the above  Prior Functional Level Prior Function Level of Independence: Independent Comments: Independent with self care. Spouse cooks. No assistive device  Self Care: Did the patient need help bathing, dressing, using the toilet or eating?  Independent  Indoor Mobility: Did the patient need assistance with walking from room to room (with or without device)? Independent  Stairs: Did the patient need assistance with internal or external stairs (with or without device)? Independent  Functional Cognition: Did the patient need help planning regular tasks such as shopping or remembering to take medications? Needed some help  Current Functional Level Cognition  Arousal/Alertness: Awake/alert Overall Cognitive Status: Difficult to assess Difficult to assess due to: Impaired communication Current Attention Level: Focused Orientation Level: Disoriented X4(mumbles word) Following Commands: Follows one step commands inconsistently, Follows one step commands with increased time General Comments: Follows simple 1 step commands inconsistently with increased time and gestural cues. Lethargic towards middle-end of session secondary to being given Haldol early this AM. Right hemi body inattention noted. Able to attend to right visual field but prefers left gaze preference.  Attention: Selective Selective Attention: Impaired Selective Attention Impairment: Verbal basic, Functional basic Memory: (difficult to assess d/t aphasia) Awareness: Impaired Awareness Impairment: Emergent impairment, Anticipatory impairment    Extremity Assessment (includes Sensation/Coordination)  Upper Extremity Assessment: Generalized weakness, RUE  deficits/detail RUE Deficits / Details: RUE appeasr weaker than left; will further assess; spontaneous movemetn noted with Lable to hold washcloth twith L hand RUE Coordination: decreased fine motor, decreased gross motor  Lower Extremity Assessment: Defer to PT evaluation    ADLs  Overall ADL's : Needs assistance/impaired Functional mobility during ADLs: Total assistance, +2 for physical assistance General ADL Comments: total A    Mobility  Overal bed mobility: Needs Assistance Bed Mobility: Rolling, Sidelying to Sit Rolling: Min assist Sidelying to sit: Mod assist, HOB elevated Sit to supine: Total assist, +2 for physical assistance General bed mobility comments: Able to initiate movement of BLEs and use of BUEs to get to EOB. RUE inattention noted.     Transfers  Overall transfer  level: Needs assistance Equipment used: 2 person hand held assist Transfers: Sit to/from Stand Sit to Stand: Mod assist, From elevated surface General transfer comment: Assist to power to standing with cues for upright posture and hip/back extension. Transferred to chair post ambulation.    Ambulation / Gait / Stairs / Wheelchair Mobility  Ambulation/Gait Ambulation/Gait assistance: +2 physical assistance, Min assist Ambulation Distance (Feet): 7 Feet Assistive device: 2 person hand held assist Gait Pattern/deviations: Step-to pattern, Step-through pattern, Decreased dorsiflexion - right, Decreased step length - right, Decreased stance time - right, Trunk flexed General Gait Details: Slow, unsteady gait with RUE extensor tone, decreased foot clearance right foot esp when fatigued. Short step lengths bilaterally.  Gait velocity: decreased    Posture / Balance Dynamic Sitting Balance Sitting balance - Comments: Pt with LOB towards right with RUE in extensor pattern requiring external support intermittently. Balance Overall balance assessment: Needs assistance Sitting-balance support: Feet supported, No  upper extremity supported Sitting balance-Leahy Scale: Poor Sitting balance - Comments: Pt with LOB towards right with RUE in extensor pattern requiring external support intermittently. Standing balance support: During functional activity, Single extremity supported Standing balance-Leahy Scale: Poor Standing balance comment: Requires Min-Mod A for static and dynamic standing balance.     Special needs/care consideration BiPAP/CPAP  N/a CPM  N/a Continuous Drip IV  N/a Dialysis  N/a Life Vest  N/a Oxygen  N/a Special Bed n/a Trach Size n/a Wound Vac n/a Skin intact Bowel mgmt: no LBM documented Bladder mgmt: incontinent Diabetic mgmt yes pta Hgb A1c 8.8. patient self administers insulin pen at home. Daughter does not think they can manage a sliding scale administration at home due to pt's cognition Sitter at bedside due to high fall risk, global aphasia and decreased safety awareness       Daughter, [name], requesting assist with discussing code status for future reference. Previous Home Environment Living Arrangements: Spouse/significant other(Husband who is 46 years old)  Lives With: Spouse Available Help at Discharge: Family, Available 24 hours/day(spouse 24/7; daughter, [name] at night and weekends; son ,Edd) Type of Home: House Home Layout: One level Home Access: Level entry(once in door, 1 step entry into kitchen and one step into ot) Bathroom Shower/Tub: Walk-in shower(small walk in with curtain; 1 step into) Biochemist, clinical: Standard Bathroom Accessibility: Yes How Accessible: Accessible via walker [place]: No Additional Comments: Spoke to family after evaluation - pt lived at home with family and was able to hold a converstaion with her family but frequently repeated herself. Pt ableto complete her own aDL tasks and was independent with mobility.   Discharge Living Setting Plans for Discharge Living Setting: Patient's home, Lives with (comment)(spouse) Type of  Home at Discharge: House Discharge Home Layout: One level Discharge Home Access: Level entry Discharge Bathroom Shower/Tub: Walk-in shower(small walk in with one step into and curtain) Discharge Bathroom Toilet: Standard Discharge Bathroom Accessibility: Yes How Accessible: Accessible via walker Does the patient have any problems obtaining your medications?: No  Hardwood floors throughout home except carpet in den. Entry into home is a closed in garage. One step with bilateral rails into kitchen.  Social/Family/Support Systems Patient Roles: Spouse, Parent Contact Information: [name], daughter and spouse, Rush Landmark Anticipated Caregiver: Bryn Gulling and his wife, Fraser Din. Daughter may hire a woman to come assist with bathing as needed Anticipated Caregiver's Contact Information: [name] [phone] Ability/Limitations of Caregiver: [name], works days Caregiver Availability: 24/7 Discharge Plan Discussed with Primary Caregiver: Yes Is Caregiver In Agreement with Plan?: Yes Does  Caregiver/Family have Issues with Lodging/Transportation while Pt is in Rehab?: No  Family all live in different homes on family farm. Pt's spouse, Rush Landmark, can provide supervision. [name] is arranging for hired person to assist some with supervising bathing as needed. [name] is main contact for spouse is HOH.   Goals/Additional Needs Patient/Family Goal for Rehab: supervision to min assist with PT, OT, and SLP Expected length of stay: ELOS 19 to 23 days Special Service Needs: Patient has had a sitter in hospital due to more confusion and agitation at night Pt/Family Agrees to Admission and willing to participate: Yes Program Orientation Provided & Reviewed with Pt/Caregiver Including Roles  & Responsibilities: Yes  Decrease burden of Care through IP rehab admission: n/a  Possible need for SNF placement upon discharge:[name], daughter, is refusing SNF.  Patient Condition: This patient's medical and functional status has  changed since the consult dated 02/12/2018  in which the Rehabilitation Physician determined and documented that the patient was potentially appropriate for intensive rehabilitative care in an inpatient rehabilitation facility. Issues have been addressed and update has been discussed with Dr. Naaman Plummer and patient now appropriate for inpatient rehabilitation. Will admit to inpatient rehab today.   Preadmission Screen Completed By:  Cleatrice Burke, 02/13/2018 11:43 AM ______________________________________________________________________   Discussed status with Dr. Naaman Plummer on 02/13/2018 at  1208 and received telephone approval for admission today.  Admission Coordinator:  Cleatrice Burke, time 1914 Date 02/13/2018

## 2018-02-13 NOTE — Progress Notes (Signed)
Crystal Gong, RN  Rehab Admission Coordinator  Physical Medicine and Rehabilitation  PMR Pre-admission  Addendum  Date of Service:  02/13/2018 11:43 AM       Related encounter: ED to Hosp-Admission (Current) from 02/09/2018 in [place]           [] Hide copied text  [] Hover for details   PMR Admission Coordinator Pre-Admission Assessment  Patient: [name] is an 83 y.o., female MRN: [id number] DOB: October 10, 1935 Height:   Weight: 59.8 kg (131 lb 13.4 oz)                                                                                                                                                  Insurance Information HMO: yes    PPO:      PCP:      IPA:      80/20:      OTHER: [name] advantage plan PRIMARY: [name]      Policy#: [id number]      Subscriber: pt CM Name: [name]      Phone#: [phone]     Fax#: [phone] Pre-Cert#: [id number]    Approved for 7 days with f/u with jill  Employer: retired Benefits:  Phone #: [phone]     Name: 02/12/18 Eff. Date: 11/21/2017     Deduct: none      Out of Pocket Max: $4400      Life Max: none CIR: $345 co pay per day days 1 to 5      SNF: no co pay days 1 until 20; $160 copy per day days 21-48; no co pay days 49 to 100 Outpatient: $40 co pay per visit     Co-Pay: visits per medical neccesity Home Health: 100%      Co-Pay: visits per medical neccesity DME: 80%     Co-Pay: 20% Providers: in network  SECONDARY: none      Medicaid Application Date:       Case Manager:  Disability Application Date:       Case Worker:   Emergency Contact Information         Contact Information    Name Relation Home Work Mobile   [name] [phone]  [phone]   [name] [phone]     [name] [name]   [phone]     Current Medical History  Patient Admitting Diagnosis: left thalamic and left parietal periventricular white matter History of  Present Illness:  [name] a 82 y.o.femalewith history of CAD, chemo induced CM, prior stroke, dementiawho was admitted on 02/09/18 with confusion and right sided weakness. CT head reviewed, unremarkable for acute intracranial process. Per report,MRI brainshowinglimited by motion artifact but showed possible acute early subacute infarcts in left thalamus and left parietal periventricular white matter. 2D echo showed EF 25% with  septal, Mild AS and apical akinesis of inferior wall. EEG done due to ongoing issues with confusion and agitation a and showed intermittent frontal slowing. Dr. Erlinda Hong recommended starting [name] 3/26 as not a candidate for ASA/Brillinta ---to repeat echo in 2-3 months and anticoagulation can be d/cedif EF >35%.BLE dopplers were negative for DVT.  She was treated with IV antibiotics X 3 days due to concerns of UTI.Has required sitters due to fall risk and safety concerns.Patient with resultant global aphasia, bouts of lethargy with agitation, left gaze preference, right sided weakness affecting mobility and self care tasks.   Patient has had issues with increased confusion at night and received Haldol prn, but this makes her more lethargic during the day. [name], [name], states that pt's dementia more with having to repeat herself, but sundowning and agitation has not been an issue at home.  Total: 6 NIHSS  Past Medical History      Past Medical History:  Diagnosis Date  . Atherosclerosis of autologous vein coronary artery bypass graft with unstable angina pectoris ([place])    CATH: 80% prox SVG-D1, 95% anastomotic SVG-D1 lesion  . CAD (coronary artery disease), native coronary artery 2004   a) 0/'63: Complicated PCI-of pLAD wtih PTCA  (failed redo PTCA) --> CABG 06/2003 for ISR LAD & D1 (LIMA-LAD, SVG-dRCA, SVG-D1);; b) 4/'07: Patent grafts, Normal RHC pressures; c) NSTEMI 9/'10: No culprit; 1/'12: recanalized LAD stent, patent grafts  . CAD S/P  percutaneous coronary angioplasty 06/05/2014   a) 1/04: BMS x 2 LAD, PTCA D1 followed by unusuccessful redo PTCA D1 --> initially turned down CABG initially then CABG in 06/2003;; b) 05/2014: DES to Prox Cx & SVG-D1; PTCA of Anastomotic SVG-D1 lesion  . Cardiomyopathy secondary to chemotherapy ([place]) 07/2009   a) Combined Ischemic & Non-ischemic (Adriamycin from [facility])EF ~40% by Cath; by Echo in 08/2010 35-40%;; b) followup echo August 2013: EF 50-55%, aortic sclerosis without stenosis. Grade 1 diastolic dysfunction  . Chronic combined systolic and diastolic congestive heart failure, NYHA class 2 ([place]) 07/2009   Mixed. 2D-echocardiogram (08/2010) - 35% to 40%. Diffuse hypokinesis. Grade 2 diastolic dysfunction.  . CKD (chronic kidney disease) stage 2, GFR 60-89 ml/min    BL Cr 1.6  . Dementia   . DJD (degenerative joint disease) of knee    left  . DM (diabetes mellitus), type 2, uncontrolled with complications ([place]) DX: 0160   Stopped of Metformin in 04/2011 in setting of AKI (Cr 3), now on insulin.  . Essential hypertension 03/12/2012  . History of breast cancer    Primary oncologist Dr. Brigitte Pulse. // First diagnosed 2000, recurrence in 06/2010. S/P left mastectomy with radiation therapy (2000), following recurrence, started chemothearpy, completed course (06/2010.)  . History of CVA (cerebrovascular accident) 11/2009; 8& 04/2009   left frontal lobe stroke (11/2009), TIA (06/2009, 04/2009) with multiple prior TIAs manifested by aphasia. H/o CVA in 1988 involving right eye.  [name] History of DVT of lower extremity 07/2003   in setting of saphenous vein grafting for CABG  . History of renal artery stenosis 2010   Left 95% stenosis s/p left renal artery stenting (09/2009), right renal artery with 50% stenosis.  [name] Hx of adenomatous colonic polyps    remote history. Last colonoscopy (03/2004) - Normal colonoscopy to the cecum, rec 5 year follow-up colonoscopy.  . Hyperlipidemia with target  LDL less than 70   . Hypothyroidism   . S/P CABG x 3 06/2003   LIMA-LAD, SVG-D1, SVG-dRCA  Family History  family history includes Cancer in her brother, father, and sister; Diabetes in her mother; Heart disease in her brother, brother, father, mother, and sister.  Prior Rehab/Hospitalizations:  Has the patient had major surgery during 100 days prior to admission? No  Has only had Home health in the past, never SNF or AIR.  Current Medications   Current Facility-Administered Medications:  .  acetaminophen (TYLENOL) tablet 650 mg, 650 mg, Oral, Q4H PRN **OR** acetaminophen (TYLENOL) solution 650 mg, 650 mg, Per Tube, Q4H PRN, 650 mg at 02/11/18 1559 **OR** acetaminophen (TYLENOL) suppository 650 mg, 650 mg, Rectal, Q4H PRN, Bland, Scott, DO, 650 mg at 02/10/18 2000 .  aspirin EC tablet 325 mg, 325 mg, Oral, Daily, 325 mg at 02/13/18 1055 **OR** aspirin suppository 300 mg, 300 mg, Rectal, Daily, Rosalin Hawking, MD, 300 mg at 02/10/18 1557 .  dextrose 5 %-0.45 % sodium chloride infusion, , Intravenous, Continuous, Diallo, Abdoulaye, MD, Stopped at 02/10/18 0100 .  ezetimibe (ZETIA) tablet 10 mg, 10 mg, Oral, Daily, Rosalin Hawking, MD, 10 mg at 02/13/18 1055 .  insulin aspart (novoLOG) injection 0-9 Units, 0-9 Units, Subcutaneous, TID WC, Bland, Scott, DO, 2 Units at 02/13/18 [phone] .  levothyroxine (SYNTHROID, LEVOTHROID) tablet 75 mcg, 75 mcg, Oral, QAC breakfast, Everrett Coombe, MD, 75 mcg at 02/13/18 1055 .  lisinopril (PRINIVIL,ZESTRIL) tablet 5 mg, 5 mg, Oral, Daily, Dorothy Spark, MD, 5 mg at 02/13/18 1055  Patients Current Diet: Fall precautions Diet regular Room service appropriate? Yes; Fluid consistency: Thin  Precautions / Restrictions Precautions Precautions: Fall Restrictions Weight Bearing Restrictions: No   Has the patient had 2 or more falls or a fall with injury in the past year?No  Prior Activity Level Community (5-7x/wk): Has not drivin car highway in a few  years; uses [employer] on [place] property and drives her Chrysler cruiser between homes on their farm property    Patient and spouse live on farm property with children having homes on the same property. Patient has to repeat herself at times, but independent with all adls. [name], [name], manages pt's pill box for meds and calls her to remind of when to take these meds. Pt is a diabetic, but uses insulin pen and self injects her insulin. [name] checks her CBG's periodically. Spouse cooks. Patient does drive a [employer] multimedia on their property and her [place] car on the property to visit family. Does not drive on highway.  Home Assistive Devices / Equipment none  Prior Device Use: Indicate devices/aids used by the patient prior to current illness, exacerbation or injury? None of the above  Prior Functional Level Prior Function Level of Independence: Independent Comments: Independent with self care. Spouse cooks. No assistive device  Self Care: Did the patient need help bathing, dressing, using the toilet or eating?  Independent  Indoor Mobility: Did the patient need assistance with walking from room to room (with or without device)? Independent  Stairs: Did the patient need assistance with internal or external stairs (with or without device)? Independent  Functional Cognition: Did the patient need help planning regular tasks such as shopping or remembering to take medications? Needed some help  Current Functional Level Cognition  Arousal/Alertness: Awake/alert Overall Cognitive Status: Difficult to assess Difficult to assess due to: Impaired communication Current Attention Level: Focused Orientation Level: Disoriented X4(mumbles word) Following Commands: Follows one step commands inconsistently, Follows one step commands with increased time General Comments: Follows simple 1 step commands inconsistently with increased time and gestural cues.  Lethargic towards middle-end of  session secondary to being given Haldol early this AM. Right hemi body inattention noted. Able to attend to right visual field but prefers left gaze preference.  Attention: Selective Selective Attention: Impaired Selective Attention Impairment: Verbal basic, Functional basic Memory: (difficult to assess d/t aphasia) Awareness: Impaired Awareness Impairment: Emergent impairment, Anticipatory impairment    Extremity Assessment (includes Sensation/Coordination)  Upper Extremity Assessment: Generalized weakness, RUE deficits/detail RUE Deficits / Details: RUE appeasr weaker than left; will further assess; spontaneous movemetn noted with Lable to hold washcloth twith L hand RUE Coordination: decreased fine motor, decreased gross motor  Lower Extremity Assessment: Defer to PT evaluation    ADLs  Overall ADL's : Needs assistance/impaired Functional mobility during ADLs: Total assistance, +2 for physical assistance General ADL Comments: total A    Mobility  Overal bed mobility: Needs Assistance Bed Mobility: Rolling, Sidelying to Sit Rolling: Min assist Sidelying to sit: Mod assist, HOB elevated Sit to supine: Total assist, +2 for physical assistance General bed mobility comments: Able to initiate movement of BLEs and use of BUEs to get to EOB. RUE inattention noted.     Transfers  Overall transfer level: Needs assistance Equipment used: 2 person hand held assist Transfers: Sit to/from Stand Sit to Stand: Mod assist, From elevated surface General transfer comment: Assist to power to standing with cues for upright posture and hip/back extension. Transferred to chair post ambulation.    Ambulation / Gait / Stairs / Wheelchair Mobility  Ambulation/Gait Ambulation/Gait assistance: +2 physical assistance, Min assist Ambulation Distance (Feet): 7 Feet Assistive device: 2 person hand held assist Gait Pattern/deviations: Step-to pattern, Step-through pattern, Decreased dorsiflexion -  right, Decreased step length - right, Decreased stance time - right, Trunk flexed General Gait Details: Slow, unsteady gait with RUE extensor tone, decreased foot clearance right foot esp when fatigued. Short step lengths bilaterally.  Gait velocity: decreased    Posture / Balance Dynamic Sitting Balance Sitting balance - Comments: Pt with LOB towards right with RUE in extensor pattern requiring external support intermittently. Balance Overall balance assessment: Needs assistance Sitting-balance support: Feet supported, No upper extremity supported Sitting balance-Leahy Scale: Poor Sitting balance - Comments: Pt with LOB towards right with RUE in extensor pattern requiring external support intermittently. Standing balance support: During functional activity, Single extremity supported Standing balance-Leahy Scale: Poor Standing balance comment: Requires Min-Mod A for static and dynamic standing balance.     Special needs/care consideration BiPAP/CPAP  N/a CPM  N/a Continuous Drip IV  N/a Dialysis  N/a Life Vest  N/a Oxygen  N/a Special Bed n/a Trach Size n/a Wound Vac n/a Skin intact Bowel mgmt: no LBM documented Bladder mgmt: incontinent Diabetic mgmt yes pta Hgb A1c 8.8. patient self administers insulin pen at home. [name] does not think they can manage a sliding scale administration at home due to pt's cognition Sitter at bedside due to high fall risk, global aphasia and decreased safety awareness                                                              [name], [name], requesting assist with discussing code status for future reference. Previous Home Environment Living Arrangements: Spouse/significant other(Husband who is 43 years old)  Lives With: Spouse Available Help at Discharge: Family, Available 24  hours/day(spouse 24/7; [name], [name] at night and weekends; son ,Edd) Type of Home: House Home Layout: One level Home Access: Level entry(once in door, 1 step entry  into kitchen and one step into ot) Bathroom Shower/Tub: Walk-in shower(small walk in with curtain; 1 step into) Bathroom Toilet: Standard Bathroom Accessibility: Yes How Accessible: Accessible via walker [place]: No Additional Comments: Spoke to family after evaluation - pt lived at home with family and was able to hold a converstaion with her family but frequently repeated herself. Pt ableto complete her own aDL tasks and was independent with mobility.   Discharge Living Setting Plans for Discharge Living Setting: Patient's home, Lives with (comment)(spouse) Type of Home at Discharge: House Discharge Home Layout: One level Discharge Home Access: Level entry Discharge Bathroom Shower/Tub: Walk-in shower(small walk in with one step into and curtain) Discharge Bathroom Toilet: Standard Discharge Bathroom Accessibility: Yes How Accessible: Accessible via walker Does the patient have any problems obtaining your medications?: No  Hardwood floors throughout home except carpet in den. Entry into home is a closed in garage. One step with bilateral rails into kitchen.  Social/Family/Support Systems Patient Roles: Spouse, Parent Contact Information: [name], [name] and spouse, Rush Landmark Anticipated Caregiver: Bryn Gulling and his wife, Fraser Din. [name] may hire a woman to come assist with bathing as needed Anticipated Caregiver's Contact Information: [name] [phone] Ability/Limitations of Caregiver: [name], works days Caregiver Availability: 24/7 Discharge Plan Discussed with Primary Caregiver: Yes Is Caregiver In Agreement with Plan?: Yes Does Caregiver/Family have Issues with Lodging/Transportation while Pt is in Rehab?: No  Family all live in different homes on family farm. Pt's spouse, Rush Landmark, can provide supervision. [name] is arranging for hired person to assist some with supervising bathing as needed. [name] is main contact for spouse is HOH.   Goals/Additional  Needs Patient/Family Goal for Rehab: supervision to min assist with PT, OT, and SLP Expected length of stay: ELOS 19 to 23 days Special Service Needs: Patient has had a sitter in hospital due to more confusion and agitation at night Pt/Family Agrees to Admission and willing to participate: Yes Program Orientation Provided & Reviewed with Pt/Caregiver Including Roles  & Responsibilities: Yes  Decrease burden of Care through IP rehab admission: n/a  Possible need for SNF placement upon discharge:[name], [name], is refusing SNF.  Patient Condition: This patient's medical and functional status has changed since the consult dated 02/12/2018  in which the Rehabilitation Physician determined and documented that the patient was potentially appropriate for intensive rehabilitative care in an inpatient rehabilitation facility. Issues have been addressed and update has been discussed with Dr. Naaman Plummer and patient now appropriate for inpatient rehabilitation. Will admit to inpatient rehab today.   Preadmission Screen Completed By:  Cleatrice Burke, 02/13/2018 11:43 AM ______________________________________________________________________   Discussed status with Dr. Naaman Plummer on 02/13/2018 at  1208 and received telephone approval for admission today.  Admission Coordinator:  Cleatrice Burke, time 2878 Date 02/13/2018         Cosigned by: Meredith Staggers, MD at 02/13/2018 1:29 PM  Revision History

## 2018-02-13 NOTE — Care Management Important Message (Signed)
Important Message  Patient Details  Name: [name] MRN: [id number] Date of Birth: 01-23-1935   Medicare Important Message Given:  Yes    Takoya Jonas 02/13/2018, 1:44 PM

## 2018-02-13 NOTE — H&P (Signed)
Physical Medicine and Rehabilitation Admission H&P    Chief Complaint  Patient presents with  . Stroke with functional deficits.      HPI: [name] is a 83 y.o. female with history of CAD, chemo induced CM, prior stroke, dementia who was admitted on 02/09/18 with confusion and right sided weakness. CT head reviewed, unremarkable for acute intracranial process. Per report, MRI brain showing limited by motion artifact but showed possible acute early subacute infarcts in left thalamus and left parietal periventricular white matter. 2D echo showed EF 25% with septal,  Mild AS and apical akinesis of inferior wall. EEG done due to ongoing issues with confusion and agitation a and showed intermittent frontal slowing. Dr. Erlinda Hong recommended starting [place] on 3/26 as not a candidate for ASA/Brillinta ---to repeat echo in 2-3 months and anticoagulation can be d/ced if EF > 35%. BLE dopplers were negative for DVT. Family trying wanted to wait starting DOAC.   She was treated with IV antibiotics X 3 days due to concerns of UTI.  Has required sitters due to fall risk and safety concerns. Has issues with day night disruption. Palliative care consulted for [place] and family elected on DNR. Patient with resultant global aphasia, bouts of lethargy with agitation, left gaze preference, right sided weakness affecting mobility and self care tasks. CIR recommended due to functional deficits.    Review of Systems  Unable to perform ROS: Mental acuity    Past Medical History:  Diagnosis Date  . Atherosclerosis of autologous vein coronary artery bypass graft with unstable angina pectoris ([place])    CATH: 80% prox SVG-D1, 95% anastomotic SVG-D1 lesion  . CAD (coronary artery disease), native coronary artery 2004   a) 1/'02: Complicated PCI-of pLAD wtih PTCA  (failed redo PTCA) --> CABG 06/2003 for ISR LAD & D1 (LIMA-LAD, SVG-dRCA, SVG-D1);; b) 4/'07: Patent grafts, Normal RHC pressures; c) NSTEMI 9/'10: No culprit;  1/'12: recanalized LAD stent, patent grafts  . CAD S/P percutaneous coronary angioplasty 06/05/2014   a) 1/04: BMS x 2 LAD, PTCA D1 followed by unusuccessful redo PTCA D1 --> initially turned down CABG initially then CABG in 06/2003;; b) 05/2014: DES to Prox Cx & SVG-D1; PTCA of Anastomotic SVG-D1 lesion  . Cardiomyopathy secondary to chemotherapy ([place]) 07/2009   a) Combined Ischemic & Non-ischemic (Adriamycin from [facility])EF ~40% by Cath; by Echo in 08/2010 35-40%;; b) followup echo August 2013: EF 50-55%, aortic sclerosis without stenosis. Grade 1 diastolic dysfunction  . Chronic combined systolic and diastolic congestive heart failure, NYHA class 2 ([place]) 07/2009   Mixed. 2D-echocardiogram (08/2010) - 35% to 40%. Diffuse hypokinesis. Grade 2 diastolic dysfunction.  . CKD (chronic kidney disease) stage 2, GFR 60-89 ml/min    BL Cr 1.6  . Dementia   . DJD (degenerative joint disease) of knee    left  . DM (diabetes mellitus), type 2, uncontrolled with complications ([place]) DX: 5852   Stopped of Metformin in 04/2011 in setting of AKI (Cr 3), now on insulin.  . Essential hypertension 03/12/2012  . History of breast cancer    Primary oncologist Dr. Brigitte Pulse. // First diagnosed 2000, recurrence in 06/2010. S/P left mastectomy with radiation therapy (2000), following recurrence, started chemothearpy, completed course (06/2010.)  . History of CVA (cerebrovascular accident) 11/2009; 8& 04/2009   left frontal lobe stroke (11/2009), TIA (06/2009, 04/2009) with multiple prior TIAs manifested by aphasia. H/o CVA in 1988 involving right eye.  [name] History of DVT of lower extremity 07/2003  in setting of saphenous vein grafting for CABG  . History of renal artery stenosis 2010   Left 95% stenosis s/p left renal artery stenting (09/2009), right renal artery with 50% stenosis.  [name] Hx of adenomatous colonic polyps    remote history. Last colonoscopy (03/2004) - Normal colonoscopy to the cecum, rec 5 year follow-up  colonoscopy.  . Hyperlipidemia with target LDL less than 70   . Hypothyroidism   . S/P CABG x 3 06/2003   LIMA-LAD, SVG-D1, SVG-dRCA    Past Surgical History:  Procedure Laterality Date  . CARDIAC CATHETERIZATION  12/10/2002   Successful stenting of LAD with Cutting Balloon angioplasty of diagonal #1. There was a residual type B dissection in first diagonal branch.   . CARDIAC CATHETERIZATION  01/14/2003   Ostial 80% narrowing of the LAD - procedure was terminated after 5 unsuccessful attempts to pass the balloon into the stents and down to the lesion.  [name] CARDIAC CATHETERIZATION  07/22/2003   Recommended CABG revascularization  . CARDIAC CATHETERIZATION  03/01/2006   Low, normal LV systolic function, normal pulmonary artery pressures, and patent grafts.  [name] CARDIAC CATHETERIZATION  02/19/2008   Continue current medications and treatment plan.  [name] CARDIAC CATHETERIZATION  08/19/2009   Patent grafts with normal LV function  . CARDIAC CATHETERIZATION  11/25/2010   40% EF with anterior and distal inferior wall motion abnormalities. Patient grafts. Progression of diagonal and LAD disease.  [name] CARDIOVASCULAR STRESS TEST  09/30/2009   No scintigraphic evidence of inducible myocardial ischemia. No ECG changes. EKG negative for ischemia.  . CORONARY ARTERY BYPASS GRAFT  07/23/2003   x3. LIMA to LAD, SVG to diagonal, SVG to RCA.  [name] LEFT HEART CATHETERIZATION WITH CORONARY/GRAFT ANGIOGRAM N/A 06/05/2014   Procedure: LEFT HEART CATHETERIZATION WITH Beatrix Fetters;  Surgeon: Leonie Man, MD;  Location: [facility] CATH LAB;  Service: Cardiovascular;  Laterality: N/A;  . LUMBAR [place]    . RENAL ARTERY ANGIOPLASTY Left 10/05/2009   95% renal artery stenosis. Predilated with a 4x15 Aviator balloon, stented with a 5x15 Genesis and Aviator balloon stent with dilatatin at 10 atm. Resulting in reduction of 95% to 0% with excellent flow.  [name] RENAL DOPPLER  10/23/2011   Bilateral Renal Artery - No evidence of  significant diameter reduction, tortuosity, or any other vascular abnormality/  . SIMPLE MASTECTOMY  2000   left, in setting of breast cancer  . TOTAL ABDOMINAL HYSTERECTOMY    . TOTAL KNEE ARTHROPLASTY  12/2004, 12/2005   BL, right (2006) by Dr. Berenice Primas. Left (2007) by Dr. Ronnie Derby.  . TRANSTHORACIC ECHOCARDIOGRAM  03/13/2012   EF 50-55%, mild focal basal hypertrophy of the septum. Abnormal LV relaxation (grade 1 diastolic dysfunction).    Family History  Problem Relation Age of Onset  . Heart disease Mother   . Diabetes Mother   . Heart disease Father   . Cancer Father        colon cancer  . Heart disease Sister   . Heart disease Brother   . Cancer Brother        prostate cancer  . Heart disease Brother   . Cancer Sister        breast cancer    Social History:  Married. Lives with husband (frail per reports). [place]. A neighbor checks in daily?  Per reports  she has never smoked. She has never used smokeless tobacco. Per reports  she does not drink alcohol or use  drugs.    Allergies  Allergen Reactions  . Brilinta [Ticagrelor] Shortness Of Breath  . Plavix [Clopidogrel Bisulfate] Other (See Comments)    NOT A RESPONDER- P2Y12 ASSAY WAS 224 (09/02/14)  . Statins Other (See Comments)    Lethargy, muscle pain    Medications Prior to Admission  Medication Sig Dispense Refill  . aspirin EC 81 MG EC tablet Take 1 tablet (81 mg total) by mouth daily.    . furosemide (LASIX) 20 MG tablet Take 20 mg by mouth daily as needed for fluid or edema.    [name] ibuprofen (ADVIL,MOTRIN) 200 MG tablet Take 600 mg by mouth daily as needed for headache.    [name] LANTUS SOLOSTAR 100 UNIT/ML Solostar Pen Inject 80 Units into the skin every morning. Taper up to 110 units as directed until fasting glucose <130  5  . metFORMIN (GLUCOPHAGE-XR) 500 MG 24 hr tablet Take 1,000 mg by mouth every evening.   5  . metoprolol succinate (TOPROL-XL) 25 MG 24 hr tablet Take 25 mg by mouth  daily.  0  . nitroGLYCERIN (NITROSTAT) 0.4 MG SL tablet Place 1 tablet (0.4 mg total) under the tongue every 5 (five) minutes x 3 doses as needed for chest pain. 25 tablet 2  . SYNTHROID 100 MCG tablet Take 100 mcg by mouth daily.     . clopidogrel (PLAVIX) 75 MG tablet Take 1 tablet (75 mg total) by mouth daily. CALL FOR APPT FOR FURTHER REFILLS, THANKS! (Patient not taking: Reported on 02/09/2018) 15 tablet 0  . pantoprazole (PROTONIX) 40 MG tablet Take 1 tablet (40 mg total) by mouth daily. (Patient not taking: Reported on 08/04/2015) 30 tablet 11    Drug Regimen Review  Drug regimen was reviewed and remains appropriate with no significant issues identified  Home: Home Living Family/patient expects to be discharged to:: Skilled nursing facility Additional Comments: Spoke to family after evaluation - pt lived at home with family and was able to hold a converstaion with her family but frequently repeated herself. Pt ableto complete her own aDL tasks and was independent with mobility.    Functional History: Prior Function Level of Independence: Independent Comments: see above  Functional Status:  Mobility: Bed Mobility Overal bed mobility: Needs Assistance Bed Mobility: Rolling, Sidelying to Sit Rolling: Min assist Sidelying to sit: Mod assist, HOB elevated Sit to supine: Total assist, +2 for physical assistance General bed mobility comments: Able to initiate movement of BLEs and use of BUEs to get to EOB. RUE inattention noted.  Transfers Overall transfer level: Needs assistance Equipment used: 2 person hand held assist Transfers: Sit to/from Stand Sit to Stand: Mod assist, From elevated surface General transfer comment: Assist to power to standing with cues for upright posture and hip/back extension. Transferred to chair post ambulation. Ambulation/Gait Ambulation/Gait assistance: +2 physical assistance, Min assist Ambulation Distance (Feet): 7 Feet Assistive device: 2 person  hand held assist Gait Pattern/deviations: Step-to pattern, Step-through pattern, Decreased dorsiflexion - right, Decreased step length - right, Decreased stance time - right, Trunk flexed General Gait Details: Slow, unsteady gait with RUE extensor tone, decreased foot clearance right foot esp when fatigued. Short step lengths bilaterally.  Gait velocity: decreased    ADL: ADL Overall ADL's : Needs assistance/impaired Functional mobility during ADLs: Total assistance, +2 for physical assistance General ADL Comments: total A  Cognition: Cognition Overall Cognitive Status: Difficult to assess Arousal/Alertness: Lethargic Orientation Level: Disoriented X4(mumbles word) Attention: Selective Selective Attention: Impaired Selective Attention Impairment: Verbal basic, Functional  basic Memory: (difficult to assess d/t aphasia) Awareness: Impaired Awareness Impairment: Emergent impairment, Anticipatory impairment Cognition Arousal/Alertness: Lethargic, Suspect due to medications Behavior During Therapy: WFL for tasks assessed/performed Overall Cognitive Status: Difficult to assess Area of Impairment: Attention, Following commands, Problem solving Current Attention Level: Focused Following Commands: Follows one step commands inconsistently, Follows one step commands with increased time Problem Solving: Requires verbal cues, Requires tactile cues General Comments: Follows simple 1 step commands inconsistently with increased time and gestural cues. Lethargic towards middle-end of session secondary to being given Haldol early this AM. Right hemi body inattention noted. Able to attend to right visual field but prefers left gaze preference.  Difficult to assess due to: Impaired communication   Blood pressure 140/71, pulse 64, temperature 98.1 F (36.7 C), temperature source Axillary, resp. rate 18, weight 59.8 kg (131 lb 13.4 oz), SpO2 96 %. Physical Exam  Nursing note and vitals  reviewed. Constitutional: She appears well-developed and well-nourished. No distress.  HENT:  Head: Normocephalic and atraumatic.  Mouth/Throat: Oropharynx is clear and moist.  Eyes: Pupils are equal, round, and reactive to light. Conjunctivae are normal.  Left gaze preference  Neck: Normal range of motion. Neck supple.  Cardiovascular: Normal rate and regular rhythm.  Murmur heard. Respiratory: Effort normal and breath sounds normal. No stridor. No respiratory distress. She has no wheezes.  GI: Soft. Bowel sounds are normal. She exhibits no distension. There is no tenderness.  Musculoskeletal: She exhibits no edema or tenderness.  Neurological: She is alert.  Mild right facial weakness noted. Left gaze preference and had difficulty moving eyes to right field. Right visual field deficits. Globally aphasic with gibberish output. Receptive abilities appear better than receptive function.  Unable to point. Able to recognize name and family members. She needed visual and tactile cues to move limbs. Unable to perform MMT but moves RUE and RLE grossly 2 to 2+/5. LUE and LLE grossly 4-5/5. Withdraws to pain RUE and RLE.   Skin: Skin is warm and dry. She is not diaphoretic.  Psychiatric: Her affect is inappropriate. She is slowed. Cognition and memory are impaired. She expresses inappropriate judgment. She is inattentive.    Results for orders placed or performed during the hospital encounter of 02/09/18 (from the past 48 hour(s))  Glucose, capillary     Status: Abnormal   Collection Time: 02/11/18 11:46 AM  Result Value Ref Range   Glucose-Capillary 178 (H) 65 - 99 mg/dL   Comment 1 Notify RN    Comment 2 Document in Chart   Glucose, capillary     Status: Abnormal   Collection Time: 02/11/18  4:15 PM  Result Value Ref Range   Glucose-Capillary 247 (H) 65 - 99 mg/dL  Glucose, capillary     Status: Abnormal   Collection Time: 02/11/18  8:53 PM  Result Value Ref Range   Glucose-Capillary 196  (H) 65 - 99 mg/dL  Glucose, capillary     Status: Abnormal   Collection Time: 02/11/18 11:55 PM  Result Value Ref Range   Glucose-Capillary 171 (H) 65 - 99 mg/dL  Glucose, capillary     Status: Abnormal   Collection Time: 02/12/18  4:16 AM  Result Value Ref Range   Glucose-Capillary 168 (H) 65 - 99 mg/dL  Basic metabolic panel     Status: Abnormal   Collection Time: 02/12/18 10:17 AM  Result Value Ref Range   Sodium 138 135 - 145 mmol/L   Potassium 3.7 3.5 - 5.1 mmol/L   Chloride 105  101 - 111 mmol/L   CO2 22 22 - 32 mmol/L   Glucose, Bld 322 (H) 65 - 99 mg/dL   BUN 12 6 - 20 mg/dL   Creatinine, Ser 1.25 (H) 0.44 - 1.00 mg/dL   Calcium 8.9 8.9 - 10.3 mg/dL   GFR calc non Af Amer 40 (L) >60 mL/min   GFR calc Af Amer 46 (L) >60 mL/min    Comment: (NOTE) The eGFR has been calculated using the CKD EPI equation. This calculation has not been validated in all clinical situations. eGFR's persistently <60 mL/min signify possible Chronic Kidney Disease.    Anion gap 11 5 - 15    Comment: Performed at [facility] [address] [place] [address]  CBC     Status: None   Collection Time: 02/12/18 10:17 AM  Result Value Ref Range   WBC 6.4 4.0 - 10.5 K/uL   RBC 4.10 3.87 - 5.11 MIL/uL   Hemoglobin 13.2 12.0 - 15.0 g/dL   HCT 38.3 36.0 - 46.0 %   MCV 93.4 78.0 - 100.0 fL   MCH 32.2 26.0 - 34.0 pg   MCHC 34.5 30.0 - 36.0 g/dL   RDW 13.2 11.5 - 15.5 %   Platelets 157 150 - 400 K/uL    Comment: Performed at [facility], [place] [address], [place], [place] [address]  Glucose, capillary     Status: Abnormal   Collection Time: 02/12/18 10:43 AM  Result Value Ref Range   Glucose-Capillary 325 (H) 65 - 99 mg/dL  Glucose, capillary     Status: Abnormal   Collection Time: 02/12/18 12:23 PM  Result Value Ref Range   Glucose-Capillary 266 (H) 65 - 99 mg/dL  Glucose, capillary     Status: Abnormal   Collection Time: 02/12/18  4:07 PM  Result Value Ref Range    Glucose-Capillary 185 (H) 65 - 99 mg/dL  Glucose, capillary     Status: Abnormal   Collection Time: 02/12/18  9:57 PM  Result Value Ref Range   Glucose-Capillary 195 (H) 65 - 99 mg/dL  Glucose, capillary     Status: Abnormal   Collection Time: 02/13/18 12:43 AM  Result Value Ref Range   Glucose-Capillary 193 (H) 65 - 99 mg/dL   Comment 1 Notify RN    Comment 2 Document in Chart   Basic metabolic panel     Status: Abnormal   Collection Time: 02/13/18  4:17 AM  Result Value Ref Range   Sodium 137 135 - 145 mmol/L   Potassium 3.5 3.5 - 5.1 mmol/L   Chloride 105 101 - 111 mmol/L   CO2 20 (L) 22 - 32 mmol/L   Glucose, Bld 222 (H) 65 - 99 mg/dL   BUN 12 6 - 20 mg/dL   Creatinine, Ser 1.12 (H) 0.44 - 1.00 mg/dL   Calcium 8.8 (L) 8.9 - 10.3 mg/dL   GFR calc non Af Amer 45 (L) >60 mL/min   GFR calc Af Amer 52 (L) >60 mL/min    Comment: (NOTE) The eGFR has been calculated using the CKD EPI equation. This calculation has not been validated in all clinical situations. eGFR's persistently <60 mL/min signify possible Chronic Kidney Disease.    Anion gap 12 5 - 15    Comment: Performed at [facility] [address], [place], [place] [address]  CBC     Status: None   Collection Time: 02/13/18  4:17 AM  Result Value Ref  Range   WBC 9.0 4.0 - 10.5 K/uL   RBC 4.39 3.87 - 5.11 MIL/uL   Hemoglobin 14.0 12.0 - 15.0 g/dL   HCT 39.5 36.0 - 46.0 %   MCV 90.0 78.0 - 100.0 fL   MCH 31.9 26.0 - 34.0 pg   MCHC 35.4 30.0 - 36.0 g/dL   RDW 12.5 11.5 - 15.5 %   Platelets 161 150 - 400 K/uL    Comment: Performed at [facility], [place] [address], [place], [place] [address]  Glucose, capillary     Status: Abnormal   Collection Time: 02/13/18  6:38 AM  Result Value Ref Range   Glucose-Capillary 198 (H) 65 - 99 mg/dL  Glucose, capillary     Status: Abnormal   Collection Time: 02/13/18  7:42 AM  Result Value Ref Range   Glucose-Capillary 201 (H) 65 - 99 mg/dL   Comment 1 Notify RN     Comment 2 Document in Chart    No results found.     Medical Problem List and Plan: 1.  Functional deficits secondary to infarcts in left thalamus and left parietal periventricular white matter  -admit to inpatient rehab 2.  DVT Prophylaxis/Anticoagulation: Pharmaceutical: Lovenox and Other (comment) 3. Pain Management: tylenol prn 4. Mood: Patient currently with poor awareness of deficits. LCSW to follow for evaluation when appropriate.    -pt has demonstrating increased confusion and agitation at night, poor sleep patterns   -check sleep chart   -begin scheduled, low dose seroquel 5. Neuropsych: This patient is not capable of making decisions on her own behalf. 6. Skin/Wound Care: routine pressure relief measures 7. Fluids/Electrolytes/Nutrition: Monitor I/O. Offer supplements if intake poor.  8. Chronic combined systolic/diastolic CHF/chemo induced CM: Heart healthy diet. Monitor for signs of overload and check weights daily. BB discontinued due to bradycardia. Continue Lisinopril daily. Used lasix prn edema at home.  9. CAD w/p PTCA: On ASA and Zetia.  10. CKD: Avoid nephrotoxic medications. Monitor with serial checks.  11.T2DM: Hgb A1C- 8.6. Blood sugars poorly controlled.--Used Lantus 80 units with metformin at nights. Will resume Lantus 10 units daily and titrate as needed.  Monitor BS ac/hs.  12. Hypothyroid: On supplement.    Post Admission Physician Evaluation: 1. Functional deficits secondary  to left thalamus and PVWM infarcts. 2. Patient is admitted to receive collaborative, interdisciplinary care between the physiatrist, rehab nursing staff, and therapy team. 3. Patient's level of medical complexity and substantial therapy needs in context of that medical necessity cannot be provided at a lesser intensity of care such as a SNF. 4. Patient has experienced substantial functional loss from his/her baseline which was documented above under the "Functional History" and  "Functional Status" headings.  Judging by the patient's diagnosis, physical exam, and functional history, the patient has potential for functional progress which will result in measurable gains while on inpatient rehab.  These gains will be of substantial and practical use upon discharge  in facilitating mobility and self-care at the household level. 5. Physiatrist will provide 24 hour management of medical needs as well as oversight of the therapy plan/treatment and provide guidance as appropriate regarding the interaction of the two. 6. The Preadmission Screening has been reviewed and patient status is unchanged unless otherwise stated above. 7. 24 hour rehab nursing will assist with bladder management, bowel management, safety, skin/wound care, disease management, medication administration, pain management and patient education  and help integrate therapy concepts, techniques,education, etc. 8. PT will assess and treat for/with:  Lower extremity strength, range of motion, stamina, balance, functional mobility, safety, adaptive techniques and equipment, NMR, family education, cognitive perceptual awareness.   Goals are: supervision to min assist. 9. OT will assess and treat for/with: ADL's, functional mobility, safety, upper extremity strength, adaptive techniques and equipment, NMR, family education, cognitive perceptual awareness.   Goals are: supervision to min assist. Therapy may proceed with showering this patient. 10. SLP will assess and treat for/with: cognition, communication, family education.  Goals are: supervision to mod + assist. 11. Case Management and Social Worker will assess and treat for psychological issues and discharge planning. 12. Team conference will be held weekly to assess progress toward goals and to determine barriers to discharge. 13. Patient will receive at least 3 hours of therapy per day at least 5 days per week. 14. ELOS: 20-24 days       15. Prognosis:   excellent     Meredith Staggers, MD, [facility] 02/13/2018  Bary Leriche, PA-C 02/13/2018

## 2018-02-13 NOTE — Progress Notes (Signed)
Admit to unit with dtrs. Oriented to unit, plan of care , reviewed medications, therapy scheduled and routine. Discussed safety measures and rationale for low bed and mats. Dtrs. States an understanding of information given. Pt is confused and unable to follow. Margarito Liner

## 2018-02-13 NOTE — Progress Notes (Signed)
INTERIM PROGRESS  Spoke with daughter on phone [name], 2nd HPOA) about some of her questions regarding CIR.  We further discussed potential of starting eliquis 2.5mg  BID in light patient's bleed risk (HASBLED 4).  [name] requests we hold until she has had a chance to speak with patient's husband (1st HPOA) and reached family decision.  We also discussed code status further and low chances of full recovery if patient had to go through a full code.  She would like to discuss this further with her family as well and they will request palliative to come speak with them when they can all get together.  Patient to be d/c'd to CIR  -Dr. Criss Rosales

## 2018-02-13 NOTE — Progress Notes (Signed)
Family Medicine Teaching Service Daily Progress Note Intern Pager: [phone]  Patient name: [name] Medical record number: [id number] Date of birth: 1935/07/23 Age: 83 y.o. Gender: female  Primary Care Provider: Cyndi Bender, PA-C Consultants: neuro Code Status: full (advanced directive names HPOAs and declines life sustaining measures if in permanent coma or vegetative state)  Pt Overview and Major Events to Date:  [name] is a 83 y.o. female presenting with AMS. PMH is significant for MI, past stroke, HTN, HLD, T2DM, hypothyroidism, degenerative joint disease, CAD w/ CABG  Assessment and Plan:  Aphasia and facial droop in the setting of CVA:  Outside TPA time window on presentation. CT was neg for intracranial hemorrhage x2. Patient continued to have significant aphasia/word salad on our exam and neuro recommended admission for stroke workup. -Neurology following, appreciate recommendations -aspirin 325 -neuro recommends doac until f/u TTE (if EF>35% stop doac) will likely start eliquis 2.5mg  BID - (not candidate for plavix/brilinta) -SLP swallow eval-dys 3 -regular diet as tolerated -IVF D5 1/2NS at 80ml/hr  -PT/OT recommend SNF, family declined - Patient unable to have full MRI obtained yesterday due to movement, even with ativan administration. Neuro ordered repeat CT without which shows: .1 Mild hypoattenuation in left periatrial white matter, corresponding to area of possible diffusion abnormality on recent MRI. This appears slightly more prominent than on most recent head CT and could indicate expected evolution of subacute ischemia. 2. No hemorrhage. 3. Old bilateral frontal lobe and left occipital lobe infarcts. - neg LE DVT US  New EF of 25%- HFpEF down from 45-50%: Chronic combined systolic and diastolic congestive heart failure. Last prior EF 40-45% in 2015.  No signs of volume overload on exam, will continue to monitor volume status. -cardiology consulted -   Zetia 10 - continue ASA -stopped BB due to bradycardia -lisinopril -both neuro and cardiology recommend against TEE/loop  UTI: Large leuks/positive nitrites on UA.  {Patient cannot reliably communicate symptoms).   --treated w/ Ceftriaxone (3/22-24)   T2DM: Per daughter on home 80 lantus and metformin.  Patient will need glycemic control while inpatient, now on D5 1/2NS.  Family does not think they can administer even a simplified sliding scale. -SSI TID  -hold metformin  CAD: Contributing risk factor to CVA.  Does not tolerate statins however will likely benefit from aspirin given multiple risk factors and significant history with multiple CVA. -Follow Neurology recommendations  DJD: chronic, can ambulate independently at baseline -no acute intervention needed  HLD: chronic. Patient does not tolerate statins -neuro recs - May consider Zetia  Hypothyroidism: chronic, on home 100 synthroid.  TSH slightly low. -reduce to 75 synthroid  CAD s/p CABG x3: Patient unable to communicate verbally. Trop negx3 -will monitor clinically  FEN/GI: npo will reassess if patient passes swallow screen (now on D5 1/2NS) Prophylaxis: SCDs   Husband is HPOA> Naomie Crow [phone] first line and then daughter   Disposition: medically cleared for CIR today, pending admissions coordinator  Subjective:  Patient sleeping on rounds, had been awake most of night per sitter.  She did get 1mg  haldol overnight  Objective: Temp:  [97.5 F (36.4 C)-98.7 F (37.1 C)] 98.5 F (36.9 C) (03/26 0401) Pulse Rate:  [60-87] 76 (03/26 0401) Resp:  [16-20] 20 (03/26 0401) BP: (136-157)/(55-84) 145/76 (03/26 0401) SpO2:  [97 %-99 %] 99 % (03/26 0401) Physical Exam: General: sleeping, vitals stable Cardiovascular: RRR, 3/6 murmur, no LE edema Respiratory: CTA B from posterior, some expiratory wheezes on anterior ausculation,  normal work of breathing Gastrointestinal: soft, nontender,  nondistended MSK: sleeping, was moving well last night Derm: no rashes appreciated Neuro: patient sleeping  Laboratory: Recent Labs  Lab 02/09/18 1306 02/09/18 1323 02/12/18 1017 02/13/18 0417  WBC 7.6  --  6.4 9.0  HGB 13.7 13.3 13.2 14.0  HCT 40.0 39.0 38.3 39.5  PLT 156  --  157 161   Recent Labs  Lab 02/09/18 1306  02/11/18 0527 02/12/18 1017 02/13/18 0417  NA 139   < > 142 138 137  K 3.8   < > 3.7 3.7 3.5  CL 107   < > 107 105 105  CO2 23  --  21* 22 20*  BUN 14   < > 10 12 12   CREATININE 1.09*   < > 0.98 1.25* 1.12*  CALCIUM 8.9  --  9.1 8.9 8.8*  PROT 6.6  --   --   --   --   BILITOT 0.6  --   --   --   --   ALKPHOS 82  --   --   --   --   ALT 19  --   --   --   --   AST 25  --   --   --   --   GLUCOSE 163*   < > 129* 322* 222*   < > = values in this interval not displayed.   Lipid Panel     Component Value Date/Time   CHOL 193 02/10/2018 0428   TRIG 125 02/10/2018 0428   HDL 37 (L) 02/10/2018 0428   CHOLHDL 5.2 02/10/2018 0428   VLDL 25 02/10/2018 0428   LDLCALC 131 (H) 02/10/2018 0428    A1C 8.6 TSH 0.317  Imaging/Diagnostic Tests: No results found.   Sherene Sires, DO 02/13/2018, 6:26 AM PGY-1, [place] Intern pager: [phone], text pages welcome

## 2018-02-13 NOTE — Progress Notes (Signed)
Physical Therapy Treatment Patient Details Name: [name] MRN: [id number] DOB: Sep 15, 1935 Today's Date: 02/13/2018    History of Present Illness Pt is an 83 y/o female admitted on 02/09/18 secondary to facial droop and aphasia. Pt found to have a L MCA infarct. PMH including but not limited to dementia, HTN, HLD, CAD, DM, CKD II, Hypothyroidism, RAS, CAD s/p CABG, ischemic cardiomyopathy and prior CVAs.    PT Comments    Patient progressing well towards PT goals. Continues to require Mod A for gait training due to right inattention, imbalance and language difficulties. RLE drags more noticeably when fatigued. Difficulty following simple 1-2 step commands out of context due to aphasia. Encouraged family to be on pt's right side and assist pt using RUE for functional tasks. Eager to get to CIR. Will continue to follow.   Follow Up Recommendations  CIR;Supervision for mobility/OOB;Supervision/Assistance - 24 hour     Equipment Recommendations  None recommended by PT    Recommendations for Other Services       Precautions / Restrictions Precautions Precautions: Fall Restrictions Weight Bearing Restrictions: No    Mobility  Bed Mobility               General bed mobility comments: Up in chair upon PT arrival.  Transfers Overall transfer level: Needs assistance Equipment used: 1 person hand held assist Transfers: Sit to/from Stand Sit to Stand: Mod assist         General transfer comment: Assist to power to standing with cues for upright posture and hip/back extension. Stood from chair x5 with manual cues for hand placement. Difficult to initiate initially.  Ambulation/Gait Ambulation/Gait assistance: Mod assist;+2 safety/equipment Ambulation Distance (Feet): 30 Feet(+ 25' + 12') Assistive device: 1 person hand held assist Gait Pattern/deviations: Step-to pattern;Step-through pattern;Decreased dorsiflexion - right;Decreased step length - right;Decreased stance  time - right;Trunk flexed;Narrow base of support Gait velocity: decreased   General Gait Details: Flexed posture in trunk and hips with CoM anterior to BoS. Assist for weight shifting to advance LEs and LUE on rail for support. Decreased foot clearance on right and leaves RLE behind when fatigued.    Stairs            Wheelchair Mobility    Modified Rankin (Stroke Patients Only) Modified Rankin (Stroke Patients Only) Pre-Morbid Rankin Score: Moderate disability Modified Rankin: Moderately severe disability     Balance Overall balance assessment: Needs assistance Sitting-balance support: No upper extremity supported;Feet supported Sitting balance-Leahy Scale: Fair     Standing balance support: During functional activity Standing balance-Leahy Scale: Poor Standing balance comment: Requires Min-Mod A for static and dynamic standing balance.                             Cognition Arousal/Alertness: Awake/alert Behavior During Therapy: WFL for tasks assessed/performed Overall Cognitive Status: Difficult to assess Area of Impairment: Attention;Following commands;Problem solving                   Current Attention Level: Sustained   Following Commands: Follows one step commands inconsistently;Follows one step commands with increased time     Problem Solving: Requires verbal cues;Requires tactile cues;Decreased initiation General Comments: Able to follow simple 1 step commands in context. Does well with automatic and involuntary tasks using RUE but not always to commands. Right inattention noted. Able to gaze right with cues but not sustain.       Exercises  General Comments General comments (skin integrity, edema, etc.): Family present during session.      Pertinent Vitals/Pain Pain Assessment: Faces Faces Pain Scale: Hurts a little bit Pain Location: not able to state where Pain Descriptors / Indicators: Grimacing Pain Intervention(s):  Monitored during session;Repositioned    Home Living   Living Arrangements: Spouse/significant other(Husband who is 59 years old) Available Help at Discharge: Family;Available 24 hours/day(spouse 24/7; daughter, [name] at night and weekends; son ,Edd) Type of Home: House Home Access: Level entry(once in door, 1 step entry into kitchen and one step into ot)   Home Layout: One level        Prior Function        Comments: Independent with self care. Spouse cooks. No assistive device   PT Goals (current goals can now be found in the care plan section) Progress towards PT goals: Progressing toward goals    Frequency    Min 4X/week      PT Plan Current plan remains appropriate    Co-evaluation              AM-PAC PT "6 Clicks" Daily Activity  Outcome Measure  Difficulty turning over in bed (including adjusting bedclothes, sheets and blankets)?: Unable Difficulty moving from lying on back to sitting on the side of the bed? : Unable Difficulty sitting down on and standing up from a chair with arms (e.g., wheelchair, bedside commode, etc,.)?: Unable Help needed moving to and from a bed to chair (including a wheelchair)?: A Little Help needed walking in hospital room?: A Lot Help needed climbing 3-5 steps with a railing? : Total 6 Click Score: 9    End of Session Equipment Utilized During Treatment: Gait belt Activity Tolerance: Patient tolerated treatment well Patient left: in chair;with call bell/phone within reach;with chair alarm set;with family/visitor present Nurse Communication: Mobility status PT Visit Diagnosis: Other abnormalities of gait and mobility (R26.89);Hemiplegia and hemiparesis Hemiplegia - Right/Left: Right Hemiplegia - dominant/non-dominant: Dominant Hemiplegia - caused by: Cerebral infarction     Time: 9798-9211 PT Time Calculation (min) (ACUTE ONLY): 30 min  Charges:  $Gait Training: 8-22 mins $Neuromuscular Re-education: 8-22 mins                     G Codes:       Wray Kearns, PT, DPT [phone]     Marguarite Arbour A Norelle Runnion 02/13/2018, 3:07 PM

## 2018-02-13 NOTE — Consult Note (Signed)
Consultation Note Date: 02/13/2018   Patient Name: [name]  DOB: Apr 07, 1935  MRN: [id number]  Age / Sex: 83 y.o., female  PCP: Crystal Bender, PA-C Referring Physician: Leeanne Rio, MD  Reason for Consultation: Establishing goals of care  HPI/Patient Profile: 83 y.o. female  with past medical history of CVA, hypothyroidism, dementia, CAD s/p CABG, HTN, DJD, breast CA, CHF, CKD, and cardiomyopathy admitted on 02/09/2018 with new expressive aphasia and facial droop.  Echo revealed newly reduced EF to 25% from 40-45%.   PMT consulted for [place].   Clinical Assessment and Goals of Care: I have reviewed medical records including EPIC notes, labs and imaging, assessed the patient and then met at the bedside along with patient's son, [name] and daughter, [name],  to discuss diagnosis prognosis, GOC, EOL wishes, disposition and options.  Patient unable to participate in [place] d/t dementia and AMS.   I introduced Palliative Medicine as specialized medical care for people living with serious illness. It focuses on providing relief from the symptoms and stress of a serious illness. The goal is to improve quality of life for both the patient and the family.  We discussed a brief life review of the patient. [place] close to her other children[place].   As far as functional, she is able to ambulate well on her own, able to perform most ADLs. Very forgetful. No longer able to cook or drive.  As for nutrition, they report a good appetite.   We discussed her current illness and what it means in the larger context of her on-going co-morbidities.  Natural disease trajectory and expectations at EOL were discussed.  I attempted to elicit values and goals of care important to the patient.  Quality of life is important to the patient. Family reiterates that patient would never want to be kept alive  artificially, including tube feeding.  The difference between aggressive medical intervention and comfort care was considered in light of the patient's goals of care. They would like to continue the current plan to proceed with rehab placement and treat the treatable. However, they would like to make patient a DNR. We discussed how a time may come soon when comfort care is appropriate for the patient and what that would look like for her.  We called patient's husband to discuss DNR status as well - he confirms. Husband unable to be at hospital and is hard of hearing.   Advanced directives, concepts specific to code status, artifical feeding and hydration, and rehospitalization were considered and discussed. MOST form was discussed and left for review.  Hospice and Palliative Care services outpatient were explained and offered.  Questions and concerns were addressed.  Hard Choices booklet left for review. The family was encouraged to call with questions or concerns.   Primary Decision Maker HCPOA - husband and daughter [name]    SUMMARY OF RECOMMENDATIONS   - DNR status - treat the treatable - long conversation w/family about what to look for to indicate decline in patient and when hospice care may be appropriate -they will reach out to PMT if needed while patient is at [place]:  DNR   Additional Recommendations (Limitations, Scope, Preferences):  Full Scope Treatment, DNR  Prognosis:   Unable to determine  Discharge Planning: CIR      Primary Diagnoses: Present on Admission: . CVA (cerebral vascular accident) ([place]) . Stroke [facility])  I have reviewed the medical record, interviewed the patient and family, and examined the patient. The following aspects are pertinent.  Past Medical History:  Diagnosis Date  . Atherosclerosis of autologous vein coronary artery bypass graft with unstable angina pectoris ([place])    CATH: 80% prox SVG-D1, 95%  anastomotic SVG-D1 lesion  . CAD (coronary artery disease), native coronary artery 2004   a) 8/'91: Complicated PCI-of pLAD wtih PTCA  (failed redo PTCA) --> CABG 06/2003 for ISR LAD & D1 (LIMA-LAD, SVG-dRCA, SVG-D1);; b) 4/'07: Patent grafts, Normal RHC pressures; c) NSTEMI 9/'10: No culprit; 1/'12: recanalized LAD stent, patent grafts  . CAD S/P percutaneous coronary angioplasty 06/05/2014   a) 1/04: BMS x 2 LAD, PTCA D1 followed by unusuccessful redo PTCA D1 --> initially turned down CABG initially then CABG in 06/2003;; b) 05/2014: DES to Prox Cx & SVG-D1; PTCA of Anastomotic SVG-D1 lesion  . Cardiomyopathy secondary to chemotherapy ([place]) 07/2009   a) Combined Ischemic & Non-ischemic (Adriamycin from [facility])EF ~40% by Cath; by Echo in 08/2010 35-40%;; b) followup echo August 2013: EF 50-55%, aortic sclerosis without stenosis. Grade 1 diastolic dysfunction  . Chronic combined systolic and diastolic congestive heart failure, NYHA class 2 ([place]) 07/2009   Mixed. 2D-echocardiogram (08/2010) - 35% to 40%. Diffuse hypokinesis. Grade 2 diastolic dysfunction.  . CKD (chronic kidney disease) stage 2, GFR 60-89 ml/min    BL Cr 1.6  . Dementia   . DJD (degenerative joint disease) of knee    left  . DM (diabetes mellitus), type 2, uncontrolled with complications ([place]) DX: 6945   Stopped of Metformin in 04/2011 in setting of AKI (Cr 3), now on insulin.  . Essential hypertension 03/12/2012  . History of breast cancer    Primary oncologist Dr. Brigitte [name]. // First diagnosed 2000, recurrence in 06/2010. S/P left mastectomy with radiation therapy (2000), following recurrence, started chemothearpy, completed course (06/2010.)  . History of CVA (cerebrovascular accident) 11/2009; 8& 04/2009   left frontal lobe stroke (11/2009), TIA (06/2009, 04/2009) with multiple prior TIAs manifested by aphasia. H/o CVA in 1988 involving right eye.  [name] History of DVT of lower extremity 07/2003   in setting of saphenous vein grafting  for CABG  . History of renal artery stenosis 2010   Left 95% stenosis s/p left renal artery stenting (09/2009), right renal artery with 50% stenosis.  [name] Hx of adenomatous colonic polyps    remote history. Last colonoscopy (03/2004) - Normal colonoscopy to the cecum, rec 5 year follow-up colonoscopy.  . Hyperlipidemia with target LDL less than 70   . Hypothyroidism   . S/P CABG x 3 06/2003   LIMA-LAD, SVG-D1, SVG-dRCA   Social History   Socioeconomic History  . Marital status: Married    Spouse name: Not on file  . Number of children: 3  . Years of education: 10 grade  . Highest education level: Not on file  Occupational History  . Occupation: Retired    Comment: previously worked as a Agricultural engineer, intermittently worked at [employer]  . Financial resource strain: Not on file  . Food insecurity:    Worry: Not on file    Inability: Not on file  . Transportation needs:    Medical: Not on file    Non-medical: Not on file  Tobacco Use  . Smoking status: Never Smoker  . Smokeless tobacco: Never Used  Substance and Sexual Activity  . Alcohol use: No  . Drug use: No  . Sexual  activity: Never  Lifestyle  . Physical activity:    Days per week: Not on file    Minutes per session: Not on file  . Stress: Not on file  Relationships  . Social connections:    Talks on phone: Not on file    Gets together: Not on file    Attends religious service: Not on file    Active member of club or organization: Not on file    Attends meetings of clubs or organizations: Not on file    Relationship status: Not on file  Other Topics Concern  . Not on file  Social History Narrative   Patient lives at home with her husband.    She has 3 children, 3 grandchildren, and 1 great-grand child.   Insurance: Medicare.    Family History  Problem Relation Age of Onset  . Heart disease Mother   . Diabetes Mother   . Heart disease Father   . Cancer Father        colon cancer  . Heart disease  Sister   . Heart disease Brother   . Cancer Brother        prostate cancer  . Heart disease Brother   . Cancer Sister        breast cancer   Scheduled Meds: . aspirin EC  325 mg Oral Daily   Or  . aspirin  300 mg Rectal Daily  . ezetimibe  10 mg Oral Daily  . insulin aspart  0-9 Units Subcutaneous TID WC  . levothyroxine  75 mcg Oral QAC breakfast  . lisinopril  5 mg Oral Daily   Continuous Infusions: . dextrose 5 % and 0.45% NaCl Stopped (02/10/18 0100)   PRN Meds:.acetaminophen **OR** acetaminophen (TYLENOL) oral liquid 160 mg/5 mL **OR** acetaminophen Allergies  Allergen Reactions  . Brilinta [Ticagrelor] Shortness Of Breath  . Plavix [Clopidogrel Bisulfate] Other (See Comments)    NOT A RESPONDER- P2Y12 ASSAY WAS 224 (09/02/14)  . Statins Other (See Comments)    Lethargy, muscle pain   Review of Systems  Unable to perform ROS: Mental status change    Physical Exam  Constitutional:  Frail, elderly female  HENT:  Head: Normocephalic and atraumatic.  Cardiovascular: Normal heart sounds.  Pulmonary/Chest: Effort normal and breath sounds normal. No respiratory distress.  Abdominal: Soft. Bowel sounds are normal.  Neurological: She is alert.  Expressive aphasia  Skin: Skin is warm, dry and intact.  Psychiatric: Cognition and memory are impaired.    Vital Signs: BP (!) 122/44 (BP Location: Left Arm)   [name] 64   Temp 98.3 F (36.8 C) (Axillary)   Resp 18   Wt 59.8 kg (131 lb 13.4 oz)   SpO2 98%   BMI 23.35 kg/m  Pain Scale: Faces   Pain Score: Asleep   SpO2: SpO2: 98 % O2 Device:SpO2: 98 % O2 Flow Rate: .   IO: Intake/output summary: No intake or output data in the 24 hours ending 02/13/18 1443  LBM:   Baseline Weight: Weight: 59.8 kg (131 lb 13.4 oz) Most recent weight: Weight: 59.8 kg (131 lb 13.4 oz)     Palliative Assessment/Data: PPS 50%     Time In: 1400 Time Out: 1515 Time Total: 75 minutes Greater than 50%  of this time was spent  counseling and coordinating care related to the above assessment and plan.  Juel Burrow, DNP, AGNP-C Palliative Medicine Team [phone]

## 2018-02-13 NOTE — Care Management Note (Signed)
Case Management Note  Patient Details  Name: [name] MRN: [id number] Date of Birth: 09/03/1935  Subjective/Objective:                    Action/Plan: Pt is discharging to CIR. CM signing off.   Expected Discharge Date:                  Expected Discharge Plan:  [place]  In-House Referral:  NA  Discharge planning Services  CM Consult  Post Acute Care Choice:  Home Health Choice offered to:  Adult Children  DME Arranged:    DME Agency:     HH Arranged:    HH Agency:     Status of Service:  Completed, signed off  If discussed at [place] of Stay Meetings, dates discussed:    Additional Comments:  Pollie Friar, RN 02/13/2018, 11:32 AM

## 2018-02-14 ENCOUNTER — Inpatient Hospital Stay (HOSPITAL_COMMUNITY): Payer: Medicare Other

## 2018-02-14 ENCOUNTER — Inpatient Hospital Stay (HOSPITAL_COMMUNITY): Payer: Medicare Other | Admitting: Speech Pathology

## 2018-02-14 DIAGNOSIS — I69351 Hemiplegia and hemiparesis following cerebral infarction affecting right dominant side: Secondary | ICD-10-CM

## 2018-02-14 LAB — COMPREHENSIVE METABOLIC PANEL
ALK PHOS: 75 U/L (ref 38–126)
ALT: 19 U/L (ref 14–54)
AST: 24 U/L (ref 15–41)
Albumin: 3.3 g/dL — ABNORMAL LOW (ref 3.5–5.0)
Anion gap: 11 (ref 5–15)
BILIRUBIN TOTAL: 1 mg/dL (ref 0.3–1.2)
BUN: 12 mg/dL (ref 6–20)
CALCIUM: 9.1 mg/dL (ref 8.9–10.3)
CO2: 23 mmol/L (ref 22–32)
CREATININE: 1.12 mg/dL — AB (ref 0.44–1.00)
Chloride: 106 mmol/L (ref 101–111)
GFR, EST AFRICAN AMERICAN: 52 mL/min — AB (ref >60)
GFR, EST NON AFRICAN AMERICAN: 45 mL/min — AB (ref >60)
Glucose, Bld: 177 mg/dL — ABNORMAL HIGH (ref 65–99)
Potassium: 3.2 mmol/L — ABNORMAL LOW (ref 3.5–5.1)
Sodium: 140 mmol/L (ref 135–145)
Total Protein: 6.4 g/dL — ABNORMAL LOW (ref 6.5–8.1)

## 2018-02-14 LAB — CBC WITH DIFFERENTIAL/PLATELET
BASOS ABS: 0 10*3/uL (ref 0.0–0.1)
Basophils Relative: 0 %
Eosinophils Absolute: 0.1 10*3/uL (ref 0.0–0.7)
Eosinophils Relative: 2 %
HEMATOCRIT: 41.3 % (ref 36.0–46.0)
HEMOGLOBIN: 14.9 g/dL (ref 12.0–15.0)
LYMPHS ABS: 2.6 10*3/uL (ref 0.7–4.0)
LYMPHS PCT: 37 %
MCH: 33.3 pg (ref 26.0–34.0)
MCHC: 36.1 g/dL — ABNORMAL HIGH (ref 30.0–36.0)
MCV: 92.4 fL (ref 78.0–100.0)
Monocytes Absolute: 0.5 10*3/uL (ref 0.1–1.0)
Monocytes Relative: 6 %
NEUTROS ABS: 3.9 10*3/uL (ref 1.7–7.7)
NEUTROS PCT: 55 %
Platelets: 163 10*3/uL (ref 150–400)
RBC: 4.47 MIL/uL (ref 3.87–5.11)
RDW: 13.1 % (ref 11.5–15.5)
WBC: 7.1 10*3/uL (ref 4.0–10.5)

## 2018-02-14 LAB — GLUCOSE, CAPILLARY
GLUCOSE-CAPILLARY: 262 mg/dL — AB (ref 65–99)
Glucose-Capillary: 172 mg/dL — ABNORMAL HIGH (ref 65–99)
Glucose-Capillary: 243 mg/dL — ABNORMAL HIGH (ref 65–99)
Glucose-Capillary: 234 mg/dL — ABNORMAL HIGH (ref 65–99)

## 2018-02-14 MED ORDER — APIXABAN 2.5 MG PO TABS
2.5000 mg | ORAL_TABLET | Freq: Two times a day (BID) | ORAL | Status: DC
  Administered 2018-02-14 – 2018-03-02 (×29): 2.5 mg via ORAL
  Filled 2018-02-14 (×30): qty 1

## 2018-02-14 MED ORDER — QUETIAPINE FUMARATE 25 MG PO TABS
25.0000 mg | ORAL_TABLET | Freq: Every day | ORAL | Status: DC
  Administered 2018-02-14 – 2018-02-16 (×3): 25 mg via ORAL
  Filled 2018-02-14 (×4): qty 1

## 2018-02-14 MED ORDER — POTASSIUM CHLORIDE CRYS ER 20 MEQ PO TBCR
20.0000 meq | EXTENDED_RELEASE_TABLET | Freq: Two times a day (BID) | ORAL | Status: DC
  Administered 2018-02-14 – 2018-02-18 (×10): 20 meq via ORAL
  Filled 2018-02-14 (×10): qty 1

## 2018-02-14 NOTE — Evaluation (Signed)
Speech Language Pathology Assessment and Plan  Patient Details  Name: [name] MRN: [id number] Date of Birth: 25-Sep-1935  SLP Diagnosis: Aphasia;Cognitive Impairments  Rehab Potential: Fair ELOS: 02/28/2018    Today's Date: 02/14/2018 SLP Individual Time: 2355-7322 SLP Individual Time Calculation (min): 50 min Miss time: 10 minutes: fatigue  Problem List:  Patient Active Problem List   Diagnosis Date Noted  . Acute ischemic left middle cerebral artery (MCA) stroke ([place]) 02/13/2018  . DNR (do not resuscitate) discussion   . Palliative care by specialist   . Goals of care, counseling/discussion   . Sundowning   . Aphasia   . Coronary artery disease involving native coronary artery of native heart without angina pectoris   . History of CVA (cerebrovascular accident)   . Poorly controlled type 2 diabetes mellitus ([place])   . AKI (acute kidney injury) ([place])   . Agitation   . Expressive aphasia   . Stroke ([place]) 02/09/2018  . Ischemic cardiomyopathy 06/27/2014  . Weakness 06/11/2014  . CAD S/P DES to Prox Cx & SVG-D1; PTCA of Anastomotic SVG-D1 lesion 06/05/2014  . NSTEMI (non-ST elevated myocardial infarction) ([place]) 06/04/2014  . Gout 03/13/2012  . History of breast cancer   . Chronic combined systolic and diastolic congestive heart failure, NYHA class 2 ([place]) 03/12/2012  . 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 Renal artery stenosis ([place]) 03/12/2012  . Essential hypertension 03/12/2012  . CVA (cerebral vascular accident) ([place]) 03/12/2012  . DM (diabetes mellitus), type 2, uncontrolled with complications ([place]) 02/54/2706  . Chronic kidney disease (CKD), stage II (mild) 03/12/2012  . Hypothyroidism- (TSH low, Synthroid held) 03/12/2012  . Hyperlipidemia with target LDL less than 70; and statin intolerant 03/12/2012  . Bradycardia 03/12/2012  . Syncope 03/12/2012  . History of DVT (deep vein  thrombosis) 03/12/2012  . DJD (degenerative joint disease) of knee   . Hx of adenomatous colonic polyps    Past Medical History:  Past Medical History:  Diagnosis Date  . Atherosclerosis of autologous vein coronary artery bypass graft with unstable angina pectoris ([place])    CATH: 80% prox SVG-D1, 95% anastomotic SVG-D1 lesion  . CAD (coronary artery disease), native coronary artery 2004   a) 2/'37: Complicated PCI-of pLAD wtih PTCA  (failed redo PTCA) --> CABG 06/2003 for ISR LAD & D1 (LIMA-LAD, SVG-dRCA, SVG-D1);; b) 4/'07: Patent grafts, Normal RHC pressures; c) NSTEMI 9/'10: No culprit; 1/'12: recanalized LAD stent, patent grafts  . CAD S/P percutaneous coronary angioplasty 06/05/2014   a) 1/04: BMS x 2 LAD, PTCA D1 followed by unusuccessful redo PTCA D1 --> initially turned down CABG initially then CABG in 06/2003;; b) 05/2014: DES to Prox Cx & SVG-D1; PTCA of Anastomotic SVG-D1 lesion  . Cardiomyopathy secondary to chemotherapy ([place]) 07/2009   a) Combined Ischemic & Non-ischemic (Adriamycin from Br Ca Rx)EF ~40% by Cath; by Echo in 08/2010 35-40%;; b) followup echo August 2013: EF 50-55%, aortic sclerosis without stenosis. Grade 1 diastolic dysfunction  . Chronic combined systolic and diastolic congestive heart failure, NYHA class 2 ([place]) 07/2009   Mixed. 2D-echocardiogram (08/2010) - 35% to 40%. Diffuse hypokinesis. Grade 2 diastolic dysfunction.  . CKD (chronic kidney disease) stage 2, GFR 60-89 ml/min    BL Cr 1.6  . Dementia   . DJD (degenerative joint disease) of knee    left  . DM (diabetes mellitus), type 2, uncontrolled with complications ([place]) DX: [id number]   Stopped of Metformin  in 04/2011 in setting of AKI (Cr 3), now on insulin.  . Essential hypertension 03/12/2012  . History of breast cancer    Primary oncologist Dr. Brigitte Pulse. // First diagnosed 2000, recurrence in 06/2010. S/P left mastectomy with radiation therapy (2000), following recurrence, started chemothearpy, completed course  (06/2010.)  . History of CVA (cerebrovascular accident) 11/2009; 8& 04/2009   left frontal lobe stroke (11/2009), TIA (06/2009, 04/2009) with multiple prior TIAs manifested by aphasia. H/o CVA in 1988 involving right eye.  [name] History of DVT of lower extremity 07/2003   in setting of saphenous vein grafting for CABG  . History of renal artery stenosis 2010   Left 95% stenosis s/p left renal artery stenting (09/2009), right renal artery with 50% stenosis.  [name] Hx of adenomatous colonic polyps    remote history. Last colonoscopy (03/2004) - Normal colonoscopy to the cecum, rec 5 year follow-up colonoscopy.  . Hyperlipidemia with target LDL less than 70   . Hypothyroidism   . S/P CABG x 3 06/2003   LIMA-LAD, SVG-D1, SVG-dRCA   Past Surgical History:  Past Surgical History:  Procedure Laterality Date  . CARDIAC CATHETERIZATION  12/10/2002   Successful stenting of LAD with Cutting Balloon angioplasty of diagonal #1. There was a residual type B dissection in first diagonal branch.   . CARDIAC CATHETERIZATION  01/14/2003   Ostial 80% narrowing of the LAD - procedure was terminated after 5 unsuccessful attempts to pass the balloon into the stents and down to the lesion.  [name] CARDIAC CATHETERIZATION  07/22/2003   Recommended CABG revascularization  . CARDIAC CATHETERIZATION  03/01/2006   Low, normal LV systolic function, normal pulmonary artery pressures, and patent grafts.  [name] CARDIAC CATHETERIZATION  02/19/2008   Continue current medications and treatment plan.  [name] CARDIAC CATHETERIZATION  08/19/2009   Patent grafts with normal LV function  . CARDIAC CATHETERIZATION  11/25/2010   40% EF with anterior and distal inferior wall motion abnormalities. Patient grafts. Progression of diagonal and LAD disease.  [name] CARDIOVASCULAR STRESS TEST  09/30/2009   No scintigraphic evidence of inducible myocardial ischemia. No ECG changes. EKG negative for ischemia.  . CORONARY ARTERY BYPASS GRAFT  07/23/2003   x3. LIMA to LAD,  SVG to diagonal, SVG to RCA.  [name] LEFT HEART CATHETERIZATION WITH CORONARY/GRAFT ANGIOGRAM N/A 06/05/2014   Procedure: LEFT HEART CATHETERIZATION WITH Beatrix Fetters;  Surgeon: Leonie Man, MD;  Location: [facility] CATH LAB;  Service: Cardiovascular;  Laterality: N/A;  . LUMBAR [place]    . RENAL ARTERY ANGIOPLASTY Left 10/05/2009   95% renal artery stenosis. Predilated with a 4x15 Aviator balloon, stented with a 5x15 Genesis and Aviator balloon stent with dilatatin at 10 atm. Resulting in reduction of 95% to 0% with excellent flow.  [name] RENAL DOPPLER  10/23/2011   Bilateral Renal Artery - No evidence of significant diameter reduction, tortuosity, or any other vascular abnormality/  . SIMPLE MASTECTOMY  2000   left, in setting of breast cancer  . TOTAL ABDOMINAL HYSTERECTOMY    . TOTAL KNEE ARTHROPLASTY  12/2004, 12/2005   BL, right (2006) by Dr. Berenice Primas. Left (2007) by Dr. Ronnie Derby.  . TRANSTHORACIC ECHOCARDIOGRAM  03/13/2012   EF 50-55%, mild focal basal hypertrophy of the septum. Abnormal LV relaxation (grade 1 diastolic dysfunction).    Assessment / Plan / Recommendation Clinical Impression Patient is a 83 y.o. female with history of CAD, chemo induced CM, prior stroke, dementia who was admitted on 02/09/18 with confusion and right sided  weakness. CT head reviewed, unremarkable for acute intracranial process. Per report, MRI brain showing limited by motion artifact but showed possible acute early subacute infarcts in left thalamus and left parietal periventricular white matter. 2D echo showed EF 25% with septal, Mild AS and apical akinesis of inferior wall. EEG done due to ongoing issues with confusion and agitation a and showed intermittent frontal slowing. Dr. Erlinda Hong recommended starting [place] on 3/26 as not a candidate for ASA/Brillinta ---to repeat echo in 2-3 months and anticoagulation can be d/ced if EF > 35%. BLE dopplers were negative for DVT. Family trying wanted to wait starting DOAC. She  was treated with IV antibiotics X 3 days due to concerns of UTI. Has required sitters due to fall risk and safety concerns. Has issues with day night disruption. Palliative care consulted for [place] and family elected on DNR. Patient with resultant global aphasia, bouts of lethargy with agitation, left gaze preference, right sided weakness affecting mobility and self care tasks. CIR recommended due to functional deficits. Pt was admitted to CIR on 02/13/2018.  Patient presents with a Wernicke's aphasia along with severe receptive and expressive language deficits. Pt is unable to answer basic yes/no questions related to her own biographic information, name functional objects, follow basic 1-step commands, or select requested object in field of 2. Even though pt's expressive language is fluent, it mainly consists of jargon, neologisms, and perseveration without awareness of errors. Pt also demonstrates severe cognitive deficits exacerbated by baseline dementia that are characterized by decreased attention, decreased problem solving, and decreased awareness, which impacts her safety and ability to complete functional tasks. Patient's daughter and daughter-in-law present and SLP educated on patient's current deficits and goals of skilled SLP intervention. Both verbalized understanding. Patient would benefit from skilled SLP intervention to maximize her cognitive-linguistic function in order to maximize her overall functional independence prior to discharge.    Skilled Therapeutic Interventions          Administered a cognitive-linguistic evaluation. Please see above for details.     SLP Assessment  Patient will need skilled [place] [facility] Services during CIR admission    Recommendations  Oral Care Recommendations: Oral care BID Recommendations for Other Services: Neuropsych consult Patient destination: Home Follow up Recommendations: 24 hour supervision/assistance;Home Health SLP Equipment  Recommended: None recommended by SLP    SLP Frequency 3 to 5 out of 7 days   SLP Duration  SLP Intensity  SLP Treatment/Interventions 02/28/2018  Minumum of 1-2 x/day, 30 to 90 minutes  Cognitive remediation/compensation;Environmental controls;Multimodal communication approach;Therapeutic Activities;Functional tasks;Cueing hierarchy;Internal/external aids;Patient/family education;Speech/Language facilitation    Pain Pain Assessment Pain Score: 0-No pain Faces Pain Scale: No hurt  Prior Functioning Type of Home: House  Lives With: Spouse Available Help at Discharge: Family;Available 24 hours/day  Function:  Cognition Comprehension Comprehension assist level: Understands basic less than 25% of the time/ requires cueing >75% of the time  Expression   Expression assist level: Expresses basis less than 25% of the time/requires cueing >75% of the time.  Social Interaction Social Interaction assist level: Interacts appropriately 25 - 49% of time - Needs frequent redirection.  Problem Solving Problem solving assist level: Solves basic less than 25% of the time - needs direction nearly all the time or does not effectively solve problems and may need a restraint for safety  Memory Memory assist level: Recognizes or recalls less than 25% of the time/requires cueing greater than 75% of the time   Short Term Goals: Week 1: SLP Short  Term Goal 1 (Week 1): Patient will identify functional items from a field of 2 with 50% accuracy with Max A multimodal cues.  SLP Short Term Goal 2 (Week 1): Patient will answer basic yes/no questions in regards to biographical information with 50% accuracy and Max A multimodal cues.  SLP Short Term Goal 3 (Week 1): Patient will follow basic 1-step directions during functional tasks with Max A multimodal cues.  SLP Short Term Goal 4 (Week 1): Patient will complete basic and functional problem solving tasks with Max A multimodal cues.  SLP Short Term Goal 5 (Week  1): Patient will demonstrate sustained attention for 5-minute intervals during functional tasks with Max A verbal cues.  SLP Short Term Goal 6 (Week 1): Patient will self-monitor verbal errors x2 within a 45-minute session with Max A multimodal cues.   Refer to Care Plan for Long Term Goals  Recommendations for other services: Neuropsych  Discharge Criteria: Patient will be discharged from SLP if patient refuses treatment 3 consecutive times without medical reason, if treatment goals not met, if there is a change in medical status, if patient makes no progress towards goals or if patient is discharged from hospital.  The above assessment, treatment plan, treatment alternatives and goals were discussed and mutually agreed upon: by patient  Meredeth Ide  SLP - Student 02/14/2018, 3:16 PM

## 2018-02-14 NOTE — Progress Notes (Signed)
[place] for apixaban Indication: cardiomyopathy and history of stroke  Allergies  Allergen Reactions  . Brilinta [Ticagrelor] Shortness Of Breath  . Plavix [Clopidogrel Bisulfate] Other (See Comments)    NOT A RESPONDER- P2Y12 ASSAY WAS 224 (09/02/14)  . Statins Other (See Comments)    Lethargy, muscle pain    Patient Measurements: Weight: 132 lb 4.4 oz (60 kg)  Vital Signs: Temp: 98.1 F (36.7 C) (03/27 1440) Temp Source: Oral (03/27 1440) BP: 153/62 (03/27 1440) Pulse Rate: 64 (03/27 1440)  Labs: Recent Labs    02/12/18 1017 02/13/18 0417 02/14/18 0711  HGB 13.2 14.0 14.9  HCT 38.3 39.5 41.3  PLT 157 161 163  CREATININE 1.25* 1.12* 1.12*    CrCl cannot be calculated (Unknown ideal weight.).   Medical History: Past Medical History:  Diagnosis Date  . Atherosclerosis of autologous vein coronary artery bypass graft with unstable angina pectoris ([place])    CATH: 80% prox SVG-D1, 95% anastomotic SVG-D1 lesion  . CAD (coronary artery disease), native coronary artery 2004   a) 7/'03: Complicated PCI-of pLAD wtih PTCA  (failed redo PTCA) --> CABG 06/2003 for ISR LAD & D1 (LIMA-LAD, SVG-dRCA, SVG-D1);; b) 4/'07: Patent grafts, Normal RHC pressures; c) NSTEMI 9/'10: No culprit; 1/'12: recanalized LAD stent, patent grafts  . CAD S/P percutaneous coronary angioplasty 06/05/2014   a) 1/04: BMS x 2 LAD, PTCA D1 followed by unusuccessful redo PTCA D1 --> initially turned down CABG initially then CABG in 06/2003;; b) 05/2014: DES to Prox Cx & SVG-D1; PTCA of Anastomotic SVG-D1 lesion  . Cardiomyopathy secondary to chemotherapy ([place]) 07/2009   a) Combined Ischemic & Non-ischemic (Adriamycin from Br Ca Rx)EF ~40% by Cath; by Echo in 08/2010 35-40%;; b) followup echo August 2013: EF 50-55%, aortic sclerosis without stenosis. Grade 1 diastolic dysfunction  . Chronic combined systolic and diastolic congestive heart failure, NYHA class 2 ([place]) 07/2009    Mixed. 2D-echocardiogram (08/2010) - 35% to 40%. Diffuse hypokinesis. Grade 2 diastolic dysfunction.  . CKD (chronic kidney disease) stage 2, GFR 60-89 ml/min    BL Cr 1.6  . Dementia   . DJD (degenerative joint disease) of knee    left  . DM (diabetes mellitus), type 2, uncontrolled with complications ([place]) DX: [id number]   Stopped of Metformin in 04/2011 in setting of AKI (Cr 3), now on insulin.  . Essential hypertension 03/12/2012  . History of breast cancer    Primary oncologist Dr. Brigitte Pulse. // First diagnosed 2000, recurrence in 06/2010. S/P left mastectomy with radiation therapy (2000), following recurrence, started chemothearpy, completed course (06/2010.)  . History of CVA (cerebrovascular accident) 11/2009; 8& 04/2009   left frontal lobe stroke (11/2009), TIA (06/2009, 04/2009) with multiple prior TIAs manifested by aphasia. H/o CVA in 1988 involving right eye.  [name] History of DVT of lower extremity 07/2003   in setting of saphenous vein grafting for CABG  . History of renal artery stenosis 2010   Left 95% stenosis s/p left renal artery stenting (09/2009), right renal artery with 50% stenosis.  [name] Hx of adenomatous colonic polyps    remote history. Last colonoscopy (03/2004) - Normal colonoscopy to the cecum, rec 5 year follow-up colonoscopy.  . Hyperlipidemia with target LDL less than 70   . Hypothyroidism   . S/P CABG x 3 06/2003   LIMA-LAD, SVG-D1, SVG-dRCA    Assessment: 83 year old admitted to rehab with medical history of cardiomyopathy (EF 35%) and recent stroke. Patient and family  discussed anticoagulation with rehab PA, Dr Erlinda Hong was also part of discussion and recommended low dose apixaban. Given age of 83yo and wt of 60kg, low dose of 2.5mg  bid is appropriate.   Goal of Therapy:  Monitor platelets by anticoagulation protocol: Yes   Plan:  Continue apixaban 2.5mg  bid as ordered by MD Pharmacy to sign off and follow peripherally  Erin Hearing PharmD., BCPS Clinical  Pharmacist 02/14/2018 5:06 PM

## 2018-02-14 NOTE — Evaluation (Signed)
Occupational Therapy Assessment and Plan  Patient Details  Name: [name] MRN: [id number] Date of Birth: [birth year]  OT Diagnosis: cognitive deficits, hemiplegia affecting dominant side and muscle weakness (generalized) Rehab Potential: Rehab Potential (ACUTE ONLY): Fair ELOS: 14 days   Today's Date: 02/14/2018 OT Individual Time:900  - 1000       Problem List:  Patient Active Problem List   Diagnosis Date Noted  . Acute ischemic left middle cerebral artery (MCA) stroke ([place]) 02/13/2018  . DNR (do not resuscitate) discussion   . Palliative care by specialist   . Goals of care, counseling/discussion   . Sundowning   . Aphasia   . Coronary artery disease involving native coronary artery of native heart without angina pectoris   . History of CVA (cerebrovascular accident)   . Poorly controlled type 2 diabetes mellitus ([place])   . AKI (acute kidney injury) ([place])   . Agitation   . Expressive aphasia   . Stroke ([place]) 02/09/2018  . Ischemic cardiomyopathy 06/27/2014  . Weakness 06/11/2014  . CAD S/P DES to Prox Cx & SVG-D1; PTCA of Anastomotic SVG-D1 lesion 06/05/2014  . NSTEMI (non-ST elevated myocardial infarction) ([place]) 06/04/2014  . Gout 03/13/2012  . History of breast cancer   . Chronic combined systolic and diastolic congestive heart failure, NYHA class 2 ([place]) 03/12/2012  . 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 Renal artery stenosis ([place]) 03/12/2012  . Essential hypertension 03/12/2012  . CVA (cerebral vascular accident) ([place]) 03/12/2012  . DM (diabetes mellitus), type 2, uncontrolled with complications ([place]) 33/35/4562  . Chronic kidney disease (CKD), stage II (mild) 03/12/2012  . Hypothyroidism- (TSH low, Synthroid held) 03/12/2012  . Hyperlipidemia with target LDL less than 70; and statin intolerant 03/12/2012  . Bradycardia 03/12/2012  . Syncope 03/12/2012  . History of DVT (deep vein  thrombosis) 03/12/2012  . DJD (degenerative joint disease) of knee   . Hx of adenomatous colonic polyps     Past Medical History:  Past Medical History:  Diagnosis Date  . Atherosclerosis of autologous vein coronary artery bypass graft with unstable angina pectoris ([place])    CATH: 80% prox SVG-D1, 95% anastomotic SVG-D1 lesion  . CAD (coronary artery disease), native coronary artery 2004   a) 5/'63: Complicated PCI-of pLAD wtih PTCA  (failed redo PTCA) --> CABG 06/2003 for ISR LAD & D1 (LIMA-LAD, SVG-dRCA, SVG-D1);; b) 4/'07: Patent grafts, Normal RHC pressures; c) NSTEMI 9/'10: No culprit; 1/'12: recanalized LAD stent, patent grafts  . CAD S/P percutaneous coronary angioplasty 06/05/2014   a) 1/04: BMS x 2 LAD, PTCA D1 followed by unusuccessful redo PTCA D1 --> initially turned down CABG initially then CABG in 06/2003;; b) 05/2014: DES to Prox Cx & SVG-D1; PTCA of Anastomotic SVG-D1 lesion  . Cardiomyopathy secondary to chemotherapy ([place]) 07/2009   a) Combined Ischemic & Non-ischemic (Adriamycin from [facility] Rx)EF ~40% by Cath; by Echo in 08/2010 35-40%;; b) followup echo August 2013: EF 50-55%, aortic sclerosis without stenosis. Grade 1 diastolic dysfunction  . Chronic combined systolic and diastolic congestive heart failure, NYHA class 2 ([place]) 07/2009   Mixed. 2D-echocardiogram (08/2010) - 35% to 40%. Diffuse hypokinesis. Grade 2 diastolic dysfunction.  . CKD (chronic kidney disease) stage 2, GFR 60-89 ml/min    BL Cr 1.6  . Dementia   . DJD (degenerative joint disease) of knee    left  . DM (diabetes mellitus), type 2, uncontrolled with complications ([facility]) DX:  2001   Stopped of Metformin in 04/2011 in setting of AKI (Cr 3), now on insulin.  . Essential hypertension 03/12/2012  . History of breast cancer    Primary oncologist Dr. Brigitte Pulse. // First diagnosed 2000, recurrence in 06/2010. S/P left mastectomy with radiation therapy (2000), following recurrence, started chemothearpy, completed  course (06/2010.)  . History of CVA (cerebrovascular accident) 11/2009; 8& 04/2009   left frontal lobe stroke (11/2009), TIA (06/2009, 04/2009) with multiple prior TIAs manifested by aphasia. H/o CVA in 1988 involving right eye.  [name] History of DVT of lower extremity 07/2003   in setting of saphenous vein grafting for CABG  . History of renal artery stenosis 2010   Left 95% stenosis s/p left renal artery stenting (09/2009), right renal artery with 50% stenosis.  [name] Hx of adenomatous colonic polyps    remote history. Last colonoscopy (03/2004) - Normal colonoscopy to the cecum, rec 5 year follow-up colonoscopy.  . Hyperlipidemia with target LDL less than 70   . Hypothyroidism   . S/P CABG x 3 06/2003   LIMA-LAD, SVG-D1, SVG-dRCA   Past Surgical History:  Past Surgical History:  Procedure Laterality Date  . CARDIAC CATHETERIZATION  12/10/2002   Successful stenting of LAD with Cutting Balloon angioplasty of diagonal #1. There was a residual type B dissection in first diagonal branch.   . CARDIAC CATHETERIZATION  01/14/2003   Ostial 80% narrowing of the LAD - procedure was terminated after 5 unsuccessful attempts to pass the balloon into the stents and down to the lesion.  [name] CARDIAC CATHETERIZATION  07/22/2003   Recommended CABG revascularization  . CARDIAC CATHETERIZATION  03/01/2006   Low, normal LV systolic function, normal pulmonary artery pressures, and patent grafts.  [name] CARDIAC CATHETERIZATION  02/19/2008   Continue current medications and treatment plan.  [name] CARDIAC CATHETERIZATION  08/19/2009   Patent grafts with normal LV function  . CARDIAC CATHETERIZATION  11/25/2010   40% EF with anterior and distal inferior wall motion abnormalities. Patient grafts. Progression of diagonal and LAD disease.  [name] CARDIOVASCULAR STRESS TEST  09/30/2009   No scintigraphic evidence of inducible myocardial ischemia. No ECG changes. EKG negative for ischemia.  . CORONARY ARTERY BYPASS GRAFT  07/23/2003   x3. LIMA to  LAD, SVG to diagonal, SVG to RCA.  [name] LEFT HEART CATHETERIZATION WITH CORONARY/GRAFT ANGIOGRAM N/A 06/05/2014   Procedure: LEFT HEART CATHETERIZATION WITH Beatrix Fetters;  Surgeon: Leonie Man, MD;  Location: [facility] CATH LAB;  Service: Cardiovascular;  Laterality: N/A;  . LUMBAR [place]    . RENAL ARTERY ANGIOPLASTY Left 10/05/2009   95% renal artery stenosis. Predilated with a 4x15 Aviator balloon, stented with a 5x15 Genesis and Aviator balloon stent with dilatatin at 10 atm. Resulting in reduction of 95% to 0% with excellent flow.  [name] RENAL DOPPLER  10/23/2011   Bilateral Renal Artery - No evidence of significant diameter reduction, tortuosity, or any other vascular abnormality/  . SIMPLE MASTECTOMY  2000   left, in setting of breast cancer  . TOTAL ABDOMINAL HYSTERECTOMY    . TOTAL KNEE ARTHROPLASTY  12/2004, 12/2005   BL, right (2006) by Dr. Berenice Primas. Left (2007) by Dr. Ronnie Derby.  . TRANSTHORACIC ECHOCARDIOGRAM  03/13/2012   EF 50-55%, mild focal basal hypertrophy of the septum. Abnormal LV relaxation (grade 1 diastolic dysfunction).    Assessment & Plan Clinical Impression:  PESSY DELAMAR is a 83 y.o. female with history of CAD, chemo induced CM, prior stroke, dementia who was admitted  on 02/09/18 with confusion and right sided weakness. CT head reviewed, unremarkable for acute intracranial process. Per report, MRI brain showing limited by motion artifact but showed possible acute early subacute infarcts in left thalamus and left parietal periventricular white matter. 2D echo showed EF 25% with septal, Mild AS and apical akinesis of inferior wall. EEG done due to ongoing issues with confusion and agitation a and showed intermittent frontal slowing. Has required sitters due to fall risk and safety concerns. Has issues with day night disruption. Palliative care consulted for [place] and family elected on DNR. Patient with resultant global aphasia, bouts of lethargy with agitation, left gaze  preference, right sided weakness affecting mobility and self care tasks. CIR recommended due to functional deficits.Patient transferred to CIR on 02/13/2018 .    Patient currently requires min with basic self-care skills secondary to muscle weakness and muscle paralysis, impaired timing and sequencing, abnormal tone, unbalanced muscle activation, motor apraxia, ataxia, decreased coordination and decreased motor planning, decreased initiation, decreased attention, decreased awareness, decreased problem solving, decreased safety awareness, decreased memory and delayed processing and decreased sitting balance, decreased standing balance, decreased postural control, hemiplegia and decreased balance strategies.  Prior to hospitalization, patient could complete ADL tasks with independent .  Patient will benefit from skilled intervention to decrease level of assist with basic self-care skills and increase level of independence with iADL prior to discharge home with care partner.  Anticipate patient will require 24 hour supervision and minimal physical assistance and follow up home health.  OT - End of Session Activity Tolerance: Tolerates 10 - 20 min activity with multiple rests Endurance Deficit: Yes OT Assessment Rehab Potential (ACUTE ONLY): Fair OT Patient demonstrates impairments in the following area(s): Balance;Safety;Behavior;Sensory;Cognition;Vision;Endurance;Motor;Perception OT Basic ADL's Functional Problem(s): Eating;Grooming;Bathing;Dressing;Toileting OT Transfers Functional Problem(s): Toilet;Tub/Shower OT Additional Impairment(s): Fuctional Use of Upper Extremity OT Plan OT Intensity: Minimum of 1-2 x/day, 45 to 90 minutes OT Frequency: 5 out of 7 days OT Duration/Estimated Length of Stay: 14 days OT Treatment/Interventions: Cognitive remediation/compensation;DME/adaptive equipment instruction;Self Care/advanced ADL retraining;Therapeutic Activities;UE/LE Coordination  activities;Visual/perceptual remediation/compensation;UE/LE Strength taining/ROM;Wheelchair propulsion/positioning;Patient/family education;Therapeutic Exercise;Neuromuscular re-education;Balance/vestibular training;Discharge planning;Functional electrical stimulation OT Self Feeding Anticipated Outcome(s): (S) OT Basic Self-Care Anticipated Outcome(s): min A- (S) OT Toileting Anticipated Outcome(s): (S) OT Bathroom Transfers Anticipated Outcome(s): (S) OT Recommendation Recommendations for Other Services: Speech consult;Therapeutic Recreation consult Patient destination: Home Follow Up Recommendations: Home health OT Equipment Recommended: To be determined   Skilled Therapeutic Intervention Pt received sitting up in w/c from PT, with family present. Extensive discussion with family re OT POC, goal setting, and d/c. Pt completed w/c to toilet transfer with +2 assistance for safety, with pt requiring mod A HHA. Multimodal cues required for R UE placement on grab bar. Pt then ambulated 9f to shower with +2 HHA with mod A. Pt sat on shower bench and completed UB bathing with min A for thoroughness, with frequent manual cues required to re-position R UE d/t significant overshooting during intentional movements. Pt able to lift B LE to complete B LE washing with steadying assist for sitting balance. Pt transferred from shower to w/c with mod A HHA. Pt donned nightgown with min A to sequence positioning of shirt. Pt set up to perform oral hygiene at sink, with associated movements occurring in pt's paretic R UE while brushing teeth with her L UE. Pt required multimodal cueing to complete grooming tasks bimanually. Education provided to pt's daughter re rehab expectations and goals. Family very well willing to help. Pt left sitting  up in recliner with chair alarm on, quick release belt on, and telesitter present.   OT Evaluation Precautions/Restrictions  Precautions Precautions:  Fall Restrictions Weight Bearing Restrictions: No General Chart Reviewed: Yes Family/Caregiver Present: Yes Vital Signs Therapy Vitals Temp: 98.1 F (36.7 C) Temp Source: Oral Pulse Rate: 64 Resp: 18 BP: (!) 153/62 Patient Position (if appropriate): Lying Oxygen Therapy SpO2: 100 % O2 Device: Room Air Pain Pain Assessment Pain Score: 0-No pain Faces Pain Scale: No hurt Home Living/Prior Functioning Home Living Family/patient expects to be discharged to:: Private residence Living Arrangements: Spouse/significant other, Children Available Help at Discharge: Family, Available 24 hours/day Type of Home: House Home Access: Stairs to enter [employer] of Steps: 3 STE with rail, 2 steps from den to [employer] Layout: One level Bathroom Shower/Tub: Multimedia programmer: Standard Additional Comments: [place] several other members of her family live as well  Lives With: Spouse IADL History Homemaking Responsibilities: No Type of Occupation: Agricultural engineer Prior Function Level of Independence: Independent with basic ADLs, Independent with gait, Needs assistance with homemaking Homemaking Assistance Comments: Pt's spouse and children performed most homemaking tasks Driving: No Comments: independent without AD, [place] with family nearby    Vision Patient Visual Report: Other (comment)(L gaze preference) Vision Assessment?: Vision impaired- to be further tested in functional context Additional Comments: Pt able to scan environment with cueing Perception  Perception: Impaired Inattention/Neglect: Impaired-to be further tested in functional context Praxis Praxis: Impaired Praxis Impairment Details: Initiation;Ideomotor;Motor planning;Ideation Cognition Overall Cognitive Status: History of cognitive impairments - at baseline Arousal/Alertness: Awake/alert Memory: Impaired Memory Impairment: Storage deficit;Retrieval  deficit;Decreased recall of new information;Decreased short term memory Decreased Short Term Memory: Verbal basic;Functional basic Attention: Focused Focused Attention: Impaired Sustained Attention: Impaired Sustained Attention Impairment: Verbal basic;Functional basic Selective Attention: Impaired Awareness: Impaired Awareness Impairment: Intellectual impairment Problem Solving: Impaired Problem Solving Impairment: Functional basic;Verbal basic Behaviors: Restless;Impulsive Safety/Judgment: Impaired Sensation Sensation Light Touch: Impaired by gross assessment Stereognosis: Impaired by gross assessment Hot/Cold: Appears Intact Proprioception: Impaired Detail Proprioception Impaired Details: Impaired RUE Additional Comments: sensation appears intact B LEs, difficult to formally assess seconday to cognition Coordination Gross Motor Movements are Fluid and Coordinated: No Fine Motor Movements are Fluid and Coordinated: No Coordination and Movement Description: impaired coordination R UE/LE Motor  Motor Motor: Hemiplegia;Abnormal tone Motor - Skilled Clinical Observations: R hemiparesis, increased tone R UE/R LE Mobility  Transfers Transfers: Sit to Stand;Stand to Sit Sit to Stand: 4: Min assist Sit to Stand Details: Manual facilitation for placement;Tactile cues for sequencing;Tactile cues for initiation;Tactile cues for weight shifting;Tactile cues for placement;Verbal cues for safe use of DME/AE Stand to Sit: 4: Min assist Stand to Sit Details (indicate cue type and reason): Tactile cues for initiation;Manual facilitation for placement;Tactile cues for placement;Tactile cues for sequencing;Tactile cues for weight shifting  Trunk/Postural Assessment  Cervical Assessment Cervical Assessment: Exceptions to WFL(Forward flexion) Cervical AROM Overall Cervical AROM Comments: foward neck flexion Thoracic Assessment Thoracic Assessment: Exceptions to WFL(kyphotic) Lumbar  Assessment Lumbar Assessment: Exceptions to WFL(Posterior pelvic tilt) Postural Control Postural Control: Deficits on evaluation  Balance Balance Balance Assessed: No Static Sitting Balance Static Sitting - Level of Assistance: 5: Stand by assistance Dynamic Sitting Balance Dynamic Sitting - Level of Assistance: 4: Min assist Static Standing Balance Static Standing - Level of Assistance: 4: Min assist Dynamic Standing Balance Dynamic Standing - Level of Assistance: 3: Mod assist Extremity/Trunk Assessment RUE Assessment RUE Assessment: Exceptions to  WFL RUE AROM (degrees) Overall AROM Right Upper Extremity: Deficits;Unable to assess;Due to impaired cognition LUE Assessment LUE Assessment: Within Functional Limits   See Function Navigator for Current Functional Status.   Refer to Care Plan for Long Term Goals  Recommendations for other services: Therapeutic rec   Discharge Criteria: Patient will be discharged from OT if patient refuses treatment 3 consecutive times without medical reason, if treatment goals not met, if there is a change in medical status, if patient makes no progress towards goals or if patient is discharged from hospital.  The above assessment, treatment plan, treatment alternatives and goals were discussed and mutually agreed upon: by family  Curtis Sites 02/14/2018, 3:18 PM

## 2018-02-14 NOTE — Evaluation (Signed)
Physical Therapy Assessment and Plan  Patient Details  Name: NEYA CREEGAN MRN: [id number] Date of Birth: [birth year]  PT Diagnosis: Difficulty walking, Hemiparesis dominant, Hypertonia, Impaired cognition and Muscle weakness Rehab Potential: Good ELOS: 2 weeks   Today's Date: 02/14/2018 PT Individual Time: 0801-0900 PT Individual Time Calculation (min): 59 min    Problem List:  Patient Active Problem List   Diagnosis Date Noted  . Acute ischemic left middle cerebral artery (MCA) stroke ([place]) 02/13/2018  . DNR (do not resuscitate) discussion   . Palliative care by specialist   . Goals of care, counseling/discussion   . Sundowning   . Aphasia   . Coronary artery disease involving native coronary artery of native heart without angina pectoris   . History of CVA (cerebrovascular accident)   . Poorly controlled type 2 diabetes mellitus ([place])   . AKI (acute kidney injury) ([place])   . Agitation   . Expressive aphasia   . Stroke ([place]) 02/09/2018  . Ischemic cardiomyopathy 06/27/2014  . Weakness 06/11/2014  . CAD S/P DES to Prox Cx & SVG-D1; PTCA of Anastomotic SVG-D1 lesion 06/05/2014  . NSTEMI (non-ST elevated myocardial infarction) ([place]) 06/04/2014  . Gout 03/13/2012  . History of breast cancer   . Chronic combined systolic and diastolic congestive heart failure, NYHA class 2 ([place]) 03/12/2012  . 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 Renal artery stenosis ([place]) 03/12/2012  . Essential hypertension 03/12/2012  . CVA (cerebral vascular accident) ([place]) 03/12/2012  . DM (diabetes mellitus), type 2, uncontrolled with complications ([place]) 38/25/0539  . Chronic kidney disease (CKD), stage II (mild) 03/12/2012  . Hypothyroidism- (TSH low, Synthroid held) 03/12/2012  . Hyperlipidemia with target LDL less than 70; and statin intolerant 03/12/2012  . Bradycardia 03/12/2012  . Syncope 03/12/2012  . History of DVT  (deep vein thrombosis) 03/12/2012  . DJD (degenerative joint disease) of knee   . Hx of adenomatous colonic polyps     Past Medical History:  Past Medical History:  Diagnosis Date  . Atherosclerosis of autologous vein coronary artery bypass graft with unstable angina pectoris ([place])    CATH: 80% prox SVG-D1, 95% anastomotic SVG-D1 lesion  . CAD (coronary artery disease), native coronary artery 2004   a) 7/'67: Complicated PCI-of pLAD wtih PTCA  (failed redo PTCA) --> CABG 06/2003 for ISR LAD & D1 (LIMA-LAD, SVG-dRCA, SVG-D1);; b) 4/'07: Patent grafts, Normal RHC pressures; c) NSTEMI 9/'10: No culprit; 1/'12: recanalized LAD stent, patent grafts  . CAD S/P percutaneous coronary angioplasty 06/05/2014   a) 1/04: BMS x 2 LAD, PTCA D1 followed by unusuccessful redo PTCA D1 --> initially turned down CABG initially then CABG in 06/2003;; b) 05/2014: DES to Prox Cx & SVG-D1; PTCA of Anastomotic SVG-D1 lesion  . Cardiomyopathy secondary to chemotherapy ([place]) 07/2009   a) Combined Ischemic & Non-ischemic (Adriamycin from [facility] Rx)EF ~40% by Cath; by Echo in 08/2010 35-40%;; b) followup echo August 2013: EF 50-55%, aortic sclerosis without stenosis. Grade 1 diastolic dysfunction  . Chronic combined systolic and diastolic congestive heart failure, NYHA class 2 ([place]) 07/2009   Mixed. 2D-echocardiogram (08/2010) - 35% to 40%. Diffuse hypokinesis. Grade 2 diastolic dysfunction.  . CKD (chronic kidney disease) stage 2, GFR 60-89 ml/min    BL Cr 1.6  . Dementia   . DJD (degenerative joint disease) of knee    left  . DM (diabetes mellitus), type 2, uncontrolled with complications ([place]) DX: [id number]  Stopped of Metformin in 04/2011 in setting of AKI (Cr 3), now on insulin.  . Essential hypertension 03/12/2012  . History of breast cancer    Primary oncologist Dr. Brigitte Pulse. // First diagnosed 2000, recurrence in 06/2010. S/P left mastectomy with radiation therapy (2000), following recurrence, started chemothearpy,  completed course (06/2010.)  . History of CVA (cerebrovascular accident) 11/2009; 8& 04/2009   left frontal lobe stroke (11/2009), TIA (06/2009, 04/2009) with multiple prior TIAs manifested by aphasia. H/o CVA in 1988 involving right eye.  [name] History of DVT of lower extremity 07/2003   in setting of saphenous vein grafting for CABG  . History of renal artery stenosis 2010   Left 95% stenosis s/p left renal artery stenting (09/2009), right renal artery with 50% stenosis.  [name] Hx of adenomatous colonic polyps    remote history. Last colonoscopy (03/2004) - Normal colonoscopy to the cecum, rec 5 year follow-up colonoscopy.  . Hyperlipidemia with target LDL less than 70   . Hypothyroidism   . S/P CABG x 3 06/2003   LIMA-LAD, SVG-D1, SVG-dRCA   Past Surgical History:  Past Surgical History:  Procedure Laterality Date  . CARDIAC CATHETERIZATION  12/10/2002   Successful stenting of LAD with Cutting Balloon angioplasty of diagonal #1. There was a residual type B dissection in first diagonal branch.   . CARDIAC CATHETERIZATION  01/14/2003   Ostial 80% narrowing of the LAD - procedure was terminated after 5 unsuccessful attempts to pass the balloon into the stents and down to the lesion.  [name] CARDIAC CATHETERIZATION  07/22/2003   Recommended CABG revascularization  . CARDIAC CATHETERIZATION  03/01/2006   Low, normal LV systolic function, normal pulmonary artery pressures, and patent grafts.  [name] CARDIAC CATHETERIZATION  02/19/2008   Continue current medications and treatment plan.  [name] CARDIAC CATHETERIZATION  08/19/2009   Patent grafts with normal LV function  . CARDIAC CATHETERIZATION  11/25/2010   40% EF with anterior and distal inferior wall motion abnormalities. Patient grafts. Progression of diagonal and LAD disease.  [name] CARDIOVASCULAR STRESS TEST  09/30/2009   No scintigraphic evidence of inducible myocardial ischemia. No ECG changes. EKG negative for ischemia.  . CORONARY ARTERY BYPASS GRAFT  07/23/2003    x3. LIMA to LAD, SVG to diagonal, SVG to RCA.  [name] LEFT HEART CATHETERIZATION WITH CORONARY/GRAFT ANGIOGRAM N/A 06/05/2014   Procedure: LEFT HEART CATHETERIZATION WITH Beatrix Fetters;  Surgeon: Leonie Man, MD;  Location: [facility] CATH LAB;  Service: Cardiovascular;  Laterality: N/A;  . LUMBAR [place]    . RENAL ARTERY ANGIOPLASTY Left 10/05/2009   95% renal artery stenosis. Predilated with a 4x15 Aviator balloon, stented with a 5x15 Genesis and Aviator balloon stent with dilatatin at 10 atm. Resulting in reduction of 95% to 0% with excellent flow.  [name] RENAL DOPPLER  10/23/2011   Bilateral Renal Artery - No evidence of significant diameter reduction, tortuosity, or any other vascular abnormality/  . SIMPLE MASTECTOMY  2000   left, in setting of breast cancer  . TOTAL ABDOMINAL HYSTERECTOMY    . TOTAL KNEE ARTHROPLASTY  12/2004, 12/2005   BL, right (2006) by Dr. Berenice Primas. Left (2007) by Dr. Ronnie Derby.  . TRANSTHORACIC ECHOCARDIOGRAM  03/13/2012   EF 50-55%, mild focal basal hypertrophy of the septum. Abnormal LV relaxation (grade 1 diastolic dysfunction).    Assessment & Plan Clinical Impression: Patient is a 83 y.o. year old female with history of CAD, chemo induced CM, prior stroke, dementia who was admitted on 02/09/18 with confusion  and right sided weakness. CT head reviewed, unremarkable for acute intracranial process. Per report, MRI brain showing limited by motion artifact but showed possible acute early subacute infarcts in left thalamus and left parietal periventricular white matter. 2D echo showed EF 25% with septal, Mild AS and apical akinesis of inferior wall. EEG done due to ongoing issues with confusion and agitation a and showed intermittent frontal slowing. Dr. Erlinda Hong recommended starting [facility] on 3/26 as not a candidate for ASA/Brillinta ---to repeat echo in 2-3 months and anticoagulation can be d/ced if EF > 35%. BLE dopplers were negative for DVT. Family trying wanted to wait starting  DOAC.  She was treated with IV antibiotics X 3 days due to concerns of UTI. Has required sitters due to fall risk and safety concerns. Has issues with day night disruption. Palliative care consulted for [place] and family elected on DNR. Patient with resultant global aphasia, bouts of lethargy with agitation, left gaze preference, right sided weakness affecting mobility and self care tasks. CIR recommended due to functional deficits.  Patient transferred to CIR on 02/13/2018 .   Patient currently requires mod with mobility secondary to muscle weakness, abnormal tone, decreased coordination and decreased motor planning, decreased attention and decreased safety awareness and decreased standing balance, decreased postural control, hemiplegia and decreased balance strategies.  Prior to hospitalization, patient was independent  with mobility and lived with Spouse in a House home.  Home access is 3 STE with rail, 2 steps from den to [employer] to enter.  Patient will benefit from skilled PT intervention to maximize safe functional mobility, minimize fall risk and decrease caregiver burden for planned discharge home with 24 hour supervision.  Anticipate patient will benefit from follow up [place] at discharge.  PT - End of Session Activity Tolerance: Tolerates 30+ min activity with multiple rests Endurance Deficit: Yes PT Assessment Rehab Potential (ACUTE/IP ONLY): Good PT Barriers to Discharge: Home environment access/layout;Behavior PT Patient demonstrates impairments in the following area(s): Balance;Behavior;Endurance;Motor;Pain;Perception;Safety PT Transfers Functional Problem(s): Bed to Chair;Bed Mobility;Car;Furniture;Floor PT Locomotion Functional Problem(s): Ambulation;Wheelchair Mobility;Stairs PT Plan PT Intensity: Minimum of 1-2 x/day ,45 to 90 minutes PT Frequency: 5 out of 7 days PT Duration Estimated Length of Stay: 2 weeks PT Treatment/Interventions: Ambulation/gait training;Discharge  planning;Functional mobility training;Therapeutic Activities;Visual/perceptual remediation/compensation;Balance/vestibular training;Neuromuscular re-education;Therapeutic Exercise;Wheelchair propulsion/positioning;Cognitive remediation/compensation;DME/adaptive equipment instruction;Pain management;Splinting/orthotics;UE/LE Strength taining/ROM;Community reintegration;Functional electrical stimulation;Patient/family education;Stair training;UE/LE Coordination activities PT Transfers Anticipated Outcome(s): supervision PT Locomotion Anticipated Outcome(s): supervision PT Recommendation Follow Up Recommendations: Home health PT;24 hour supervision/assistance Patient destination: Home Equipment Recommended: To be determined  Skilled Therapeutic Intervention Evaluation completed (see details above and below) with education with pt's daughter on PT POC and goals and individual treatment initiated with focus on functional transfers and mobility. Pt supine in bed upon PT arrival with husband present. Pt agreeable to therapy tx and no evidence of pain. Pt transferred from supine>sitting EOB with min assist. Pt spilled coffee on bed and gown, therapist assisted pt to change gown and don robe, min assist for threading R arm through clothing. Pt performed stand pivot transfer from bed>w/c mod assist and transported to the gym. Pt ambulated x 30 ft without AD, mod assist, verbal cues for R LE awareness and step length. Pt ambulated x 30 ft with L handrail and min assist, verbal cues for techniques and R step length. Pt alert throughout session and following commands 75% of the time when therapist provides cues. Pt transported to ortho gym and performed car transfer with max assist as pt lifted  L LE into car before sitting, not following therapists cues. Pt transported back to gym and performed stand pivot transfer from w/c<>mat mod assist. Pt worked on sit<>stand from mat x 10 with emphasis on symmetric weightbearing  and midline in standing, min-mod assist. Pt transported back to room and left seated in w/c, hand off to OT.   PT Evaluation Precautions/Restrictions Precautions Precautions: Fall Restrictions Weight Bearing Restrictions: No General   Vital SignsTherapy Vitals Temp: 98 F (36.7 C) Temp Source: Oral Pulse Rate: 71 Resp: 16 BP: (!) 114/96 Patient Position (if appropriate): Lying Oxygen Therapy SpO2: 98 % O2 Device: Room Air   Home Living/Prior Functioning Home Living Available Help at Discharge: Family;Available 24 hours/day Type of Home: House Home Access: Stairs to enter [employer] of Steps: 3 STE with rail, 2 steps from den to [employer] Layout: One level  Lives With: Spouse Prior Function Level of Independence: Independent with basic ADLs;Independent with gait Driving: No Comments: independent without AD, [place]   Cognition Overall Cognitive Status: Difficult to assess Arousal/Alertness: Awake/alert Selective Attention: Impaired Awareness: Impaired Safety/Judgment: Impaired Sensation Sensation Light Touch: Appears Intact Proprioception: Appears Intact Additional Comments: sensation appears intact B LEs, difficult to formally assess seconday to cognition Coordination Gross Motor Movements are Fluid and Coordinated: No Fine Motor Movements are Fluid and Coordinated: No Coordination and Movement Description: impaired coordination R LE Motor  Motor Motor: Hemiplegia;Abnormal tone Motor - Skilled Clinical Observations: R hemiparesis, increased tone R UE/R LE   Trunk/Postural Assessment  Cervical Assessment Cervical Assessment: Exceptions to WFL(forward head posture) Thoracic Assessment Thoracic Assessment: Exceptions to WFL(kyphotic) Lumbar Assessment Lumbar Assessment: Exceptions to WFL(posterior pelvic tilt) Postural Control Postural Control: Deficits on evaluation  Balance Balance Balance Assessed: Yes Static  Sitting Balance Static Sitting - Level of Assistance: 5: Stand by assistance Dynamic Sitting Balance Dynamic Sitting - Level of Assistance: 4: Min assist Static Standing Balance Static Standing - Level of Assistance: 4: Min assist Dynamic Standing Balance Dynamic Standing - Level of Assistance: 3: Mod assist Extremity Assessment  RLE Assessment RLE Assessment: Exceptions to WFL(unable to formally assess seconday to cognition, appears grossly 3+/5 seen functionally) LLE Assessment LLE Assessment: Exceptions to WFL(unable to formally assess secondary to cognition, appears 4/5 globally)   See Function Navigator for Current Functional Status.   Refer to Care Plan for Long Term Goals  Recommendations for other services: None   Discharge Criteria: Patient will be discharged from PT if patient refuses treatment 3 consecutive times without medical reason, if treatment goals not met, if there is a change in medical status, if patient makes no progress towards goals or if patient is discharged from hospital.  The above assessment, treatment plan, treatment alternatives and goals were discussed and mutually agreed upon: by family  Netta Corrigan, PT, DPT 02/14/2018, 9:22 AM

## 2018-02-14 NOTE — Progress Notes (Signed)
Daughter had questions about role of blood thinners as well as risks. Discussed EF, intracranial atherosclerosis and Dr.Xu's recommendations of low dose Eliquis with repeat echo and d/c once EF >35%. Daughter would like to decrease stroke risk and wants to initiate Eliquis.

## 2018-02-14 NOTE — Progress Notes (Addendum)
Speech Language Pathology Daily Session Note  Patient Details  Name: [name] MRN: [id number] Date of Birth: March 15, 1935  Today's Date: 02/14/2018 SLP Individual Time: 1500-1530 SLP Individual Time Calculation (min): 30 min  Short Term Goals: Week 1: SLP Short Term Goal 1 (Week 1): Patient will identify functional items from a field of 2 with 50% accuracy with Max A multimodal cues.  SLP Short Term Goal 2 (Week 1): Patient will answer basic yes/no questions in regards to biographical information with 50% accuracy and Max A multimodal cues.  SLP Short Term Goal 3 (Week 1): Patient will follow basic 1-step directions during functional tasks with Max A multimodal cues.  SLP Short Term Goal 4 (Week 1): Patient will complete basic and functional problem solving tasks with Max A multimodal cues.  SLP Short Term Goal 5 (Week 1): Patient will demonstrate sustained attention for 5-minute intervals during functional tasks with Max A verbal cues.  SLP Short Term Goal 6 (Week 1): Patient will self-monitor verbal errors x2 within a 45-minute session with Max A multimodal cues.   Skilled Therapeutic Interventions:  Pt was seen for skilled ST targeting communication goals.  Pt's daughters reported that pt hadn't been to the bathroom in a little while so therapist offered to take pt to bathroom with assistance from nurse tech.  Pt needed max to total gestural, tactile, and hand over hand cues when transferring back and forth from wheelchair to toilet and when washing her hands at the sink due to receptive language deficits.  Pt needed max to total cues to recognize and correct verbal errors during exchanges with therapist.  SLP reviewed and reinforced education provided by primary SLP during evaluation regarding techniques to increase pt's awareness of verbal errors.  All questions were answered to pt's family's satisfaction at this time.  Pt left in recliner with chair alarm set, quick release belt donned  and call bell within reach.    Function:  Eating Eating                 Cognition Comprehension Comprehension assist level: Understands basic less than 25% of the time/ requires cueing >75% of the time  Expression   Expression assist level: Expresses basis less than 25% of the time/requires cueing >75% of the time.  Social Interaction Social Interaction assist level: Interacts appropriately 25 - 49% of time - Needs frequent redirection.  Problem Solving Problem solving assist level: Solves basic less than 25% of the time - needs direction nearly all the time or does not effectively solve problems and may need a restraint for safety  Memory Memory assist level: Recognizes or recalls less than 25% of the time/requires cueing greater than 75% of the time    Pain Pain Assessment Pain Scale: 0-10 Pain Score: 0-No pain Faces Pain Scale: No hurt  Therapy/Group: Individual Therapy  Goodwin [place], Selinda Orion 02/14/2018, 3:50 PM

## 2018-02-14 NOTE — Progress Notes (Addendum)
Subjective/Complaints: Pt awakens to voice, drinks coffee using Left hand no cough ROS- cannot obtain due to aphasia Objective: Vital Signs: Blood pressure (!) 141/53, pulse 64, temperature 97.7 F (36.5 C), temperature source Oral, SpO2 100 %. No results found. Results for orders placed or performed during the hospital encounter of 02/13/18 (from the past 72 hour(s))  Glucose, capillary     Status: Abnormal   Collection Time: 02/13/18  8:54 PM  Result Value Ref Range   Glucose-Capillary 206 (H) 65 - 99 mg/dL  Glucose, capillary     Status: Abnormal   Collection Time: 02/14/18  6:36 AM  Result Value Ref Range   Glucose-Capillary 172 (H) 65 - 99 mg/dL     HEENT: normal Cardio: RRR and no murmur Resp: CTA B/L and no wheezes GI: BS positive and NT, ND Extremity:  No Edema Skin:   Bruise forearms Neuro: Abnormal Sensory reduced sensation to pinch Right Upper and Lower limb, Abnormal Motor 3/5 RUE and RLE, Abnormal FMC Ataxic/ dec FMC and Aphasic Musc/Skel:  Other no pain with UE or LE ROM Gen NAD   Assessment/Plan: 1. Functional deficits secondary to Left thalamic and Left parietal infarct which require 3+ hours per day of interdisciplinary therapy in a comprehensive inpatient rehab setting. Physiatrist is providing close team supervision and 24 hour management of active medical problems listed below. Physiatrist and rehab team continue to assess barriers to discharge/monitor patient progress toward functional and medical goals. FIM:       Function - Toileting Toileting steps completed by helper: Adjust clothing prior to toileting, Performs perineal hygiene, Adjust clothing after toileting Assist level: Two helpers  Function - Air cabin crew transfer assistive device: Bedside commode Assist level to bedside commode (at bedside): 2 helpers Assist level from bedside commode (at bedside): 2 helpers                       Medical Problem List and Plan:   1. Functional deficits secondary to infarcts in left thalamus and left parietal periventricular white matter  -admit to inpatient rehab  PT, OT, SLP evals today 2. DVT Prophylaxis/Anticoagulation: Pharmaceutical: Lovenox and Other (comment)  3. Pain Management: tylenol prn  4. Mood: Patient currently with poor awareness of deficits. LCSW to follow for evaluation when appropriate.  -pt has demonstrating increased confusion and agitation at night, poor sleep patterns  -check sleep chart  -begin scheduled, low dose seroquel  5. Neuropsych: This patient is not capable of making decisions on her own behalf.  6. Skin/Wound Care: routine pressure relief measures  7. Fluids/Electrolytes/Nutrition: Monitor I/O. Offer supplements if intake poor.  8. Chronic combined systolic/diastolic CHF/chemo induced CM: Heart healthy diet. Monitor for signs of overload and check weights daily. BB discontinued due to bradycardia. Continue Lisinopril daily. Used lasix prn edema at home.  Vitals:   02/13/18 1714  BP: (!) 141/53  Pulse: 64  Temp: 97.7 F (36.5 C)  SpO2: 100%   9. CAD w/p PTCA: On ASA and Zetia.  10. CKD: Avoid nephrotoxic medications. Monitor with serial checks.  11.T2DM: Hgb A1C- 8.6. Blood sugars poorly controlled.--Used Lantus 80 units with metformin at nights. Increase Lantus to 15 units daily and titrate as needed. Monitor BS ac/hs.  CBG (last 3)  Recent Labs    02/13/18 1635 02/13/18 2054 02/14/18 0636  GLUCAP 302* 206* 172*  12. Hypothyroid: On supplement.     LOS (Days) 1 A FACE TO FACE EVALUATION WAS PERFORMED  Mitzi Hansen  E Kirsteins 02/14/2018, 7:01 AM

## 2018-02-14 NOTE — Plan of Care (Signed)
  Problem: RH BLADDER ELIMINATION Goal: RH STG MANAGE BLADDER WITH ASSISTANCE Description STG Manage Bladder With  Min Assistance  Outcome: Progressing   Problem: RH BOWEL ELIMINATION Goal: RH STG MANAGE BOWEL WITH ASSISTANCE Description STG Manage Bowel with mod I Assistance.  Outcome: Not Progressing   Problem: RH SAFETY Goal: RH STG ADHERE TO SAFETY PRECAUTIONS W/ASSISTANCE/DEVICE Description STG Adhere to Safety Precautions With cues/reminders/ Assistance/Device.  Outcome: Not Progressing   Problem: RH PAIN MANAGEMENT Goal: RH STG PAIN MANAGED AT OR BELOW PT'S PAIN GOAL Description At or below level 4  Outcome: Not Progressing   Problem: RH KNOWLEDGE DEFICIT Goal: RH STG INCREASE KNOWLEDGE OF DIABETES Description Family will be able to verbalize understanding of diabetes management, CBG monitoring and medications indepently with cues/reminders/resources provided  Outcome: Not Progressing

## 2018-02-15 ENCOUNTER — Inpatient Hospital Stay (HOSPITAL_COMMUNITY): Payer: Medicare Other

## 2018-02-15 ENCOUNTER — Inpatient Hospital Stay (HOSPITAL_COMMUNITY): Payer: Medicare Other | Admitting: Physical Therapy

## 2018-02-15 ENCOUNTER — Inpatient Hospital Stay (HOSPITAL_COMMUNITY): Payer: Medicare Other | Admitting: Speech Pathology

## 2018-02-15 LAB — CBC WITH DIFFERENTIAL/PLATELET
BASOS ABS: 0 10*3/uL (ref 0.0–0.1)
BASOS PCT: 0 %
Eosinophils Absolute: 0.1 10*3/uL (ref 0.0–0.7)
Eosinophils Relative: 1 %
HEMATOCRIT: 41.2 % (ref 36.0–46.0)
HEMOGLOBIN: 13.9 g/dL (ref 12.0–15.0)
LYMPHS PCT: 18 %
Lymphs Abs: 1.5 10*3/uL (ref 0.7–4.0)
MCH: 31.7 pg (ref 26.0–34.0)
MCHC: 33.7 g/dL (ref 30.0–36.0)
MCV: 94.1 fL (ref 78.0–100.0)
Monocytes Absolute: 0.5 10*3/uL (ref 0.1–1.0)
Monocytes Relative: 6 %
NEUTROS PCT: 75 %
Neutro Abs: 6.2 10*3/uL (ref 1.7–7.7)
Platelets: 164 10*3/uL (ref 150–400)
RBC: 4.38 MIL/uL (ref 3.87–5.11)
RDW: 13 % (ref 11.5–15.5)
WBC: 8.2 10*3/uL (ref 4.0–10.5)

## 2018-02-15 LAB — GLUCOSE, CAPILLARY
GLUCOSE-CAPILLARY: 179 mg/dL — AB (ref 65–99)
GLUCOSE-CAPILLARY: 337 mg/dL — AB (ref 65–99)
GLUCOSE-CAPILLARY: 253 mg/dL — AB (ref 65–99)
GLUCOSE-CAPILLARY: 185 mg/dL — AB (ref 65–99)
Glucose-Capillary: 404 mg/dL — ABNORMAL HIGH (ref 65–99)

## 2018-02-15 MED ORDER — SORBITOL 70 % SOLN
30.0000 mL | Freq: Every day | Status: DC | PRN
  Administered 2018-02-15 – 2018-02-26 (×3): 30 mL via ORAL
  Filled 2018-02-15 (×5): qty 30

## 2018-02-15 MED ORDER — POLYETHYLENE GLYCOL 3350 17 G PO PACK
17.0000 g | PACK | Freq: Every day | ORAL | Status: DC
  Administered 2018-02-15 – 2018-03-02 (×13): 17 g via ORAL
  Filled 2018-02-15 (×14): qty 1

## 2018-02-15 MED ORDER — CEPHALEXIN 250 MG PO CAPS
250.0000 mg | ORAL_CAPSULE | Freq: Three times a day (TID) | ORAL | Status: DC
  Administered 2018-02-15 – 2018-02-17 (×7): 250 mg via ORAL
  Filled 2018-02-15 (×9): qty 1

## 2018-02-15 MED ORDER — MAGNESIUM HYDROXIDE 400 MG/5ML PO SUSP
30.0000 mL | Freq: Once | ORAL | Status: AC
  Administered 2018-02-15: 30 mL via ORAL
  Filled 2018-02-15: qty 30

## 2018-02-15 NOTE — Progress Notes (Addendum)
Occupational Therapy Session Note  Patient Details  Name: [name] MRN: [id number] Date of Birth: Jan 19, 1935  Today's Date: 02/15/2018 OT Individual Time: 0800-0900, Session 2: [id number] OT Individual Time Calculation (min): 60 min , Session 2: 86min   Short Term Goals: Week 1:  OT Short Term Goal 1 (Week 1): Pt will safely transfer from w/c to toilet with min A  OT Short Term Goal 2 (Week 1): Pt will complete oral hygiene with min multimodal cues OT Short Term Goal 3 (Week 1): Pt will complete seated level LB dressing with stand by assist  Skilled Therapeutic Interventions/Progress Updates:    Session 1: Pt received supine in bed with no c/o pain verbalized or observed, with family present. Pt followed 2 step command with multimodal cueing to transfer supine to EOB, with CGA. Multimodal cueing and mod A provided during stand pivot transfer to w/c. Pt completed transfer to toilet with max A and multomodal cueing, including manual facilitation to place R UE on grab bar with significant overshooting observed with any intentional movement of the R UE. Pt took several steps to shower chair with max A HHA, several LOB and attempting to sit multiple times. In shower, pt had difficulty following directions, requiring multimodal cues to complete thorough bathing, perseverating several times on washing R upper leg. Pt completed stand pivot transfer to w/c with mod A. Pt donned nightgown with CGA occasionally moving fabric out of pt's way. Pt able to perform figure 4 and don R sock , however is unable to bring L foot to cross.Pt set up to perform oral hygiene at sink, demonstrating associated movements with her R hand while holding toothbrush with her L hand. Pt able to complete task with (S). Pt left sitting up in w/c with family and MD present.   Session 2: Pt received sitting up in w/c with lunch, family present. Family educated re positioning of R UE d/t lack of sensation to protect joints, as well  as strategies to effectively use cueing with pt. Manual facilitation was provided to position R UE in eyesight on lunch tray. Multimodal cues provided to bimanually grasp cup and bring to mouth x4. Pt presenting with object agnosia throughout meal. Pt used L hand to feed self several bites. Pt required max HOH to facilitate bringing R hand to mouth with orange slice. Pt re-oriented several times throughout session with verbal and visual cues. Pt left in w/c with chair alarm set, quick release belt donned, telesitter present, and family present.   Therapy Documentation Precautions:  Precautions Precautions: Fall Restrictions Weight Bearing Restrictions: No Vital Signs: Therapy Vitals Temp: 98.8 F (37.1 C) Temp Source: Oral Pulse Rate: 79 BP: (!) 162/75 Patient Position (if appropriate): Sitting Oxygen Therapy SpO2: 100 % O2 Device: Room Air Pain: Pain Assessment Pain Score: 0-No pain Faces Pain Scale: No hurt  See Function Navigator for Current Functional Status.   Therapy/Group: Individual Therapy  Curtis Sites 02/15/2018, 12:02 PM

## 2018-02-15 NOTE — Progress Notes (Signed)
[facility] Individual Statement of Services  Patient Name:  [name]  Date:  02/15/2018  Welcome to the [facility].  Our goal is to provide you with an individualized program based on your diagnosis and situation, designed to meet your specific needs.  With this comprehensive rehabilitation program, you will be expected to participate in at least 3 hours of rehabilitation therapies Monday-Friday, with modified therapy programming on the weekends.  Your rehabilitation program will include the following services:  Physical Therapy (PT), Occupational Therapy (OT), Speech Therapy (ST), 24 hour per day rehabilitation nursing, Case Management (Social Worker), Rehabilitation Medicine, Nutrition Services and Pharmacy Services  Weekly team conferences will be held on Wednesdays to discuss your progress.  Your Social Worker will talk with you frequently to get your input and to update you on team discussions.  Team conferences with you and your family in attendance may also be held.  Expected length of stay:  2 weeks  Overall anticipated outcome:  Supervision overall (minimal assistance with right arm tasks)  Depending on your progress and recovery, your program may change. Your Social Worker will coordinate services and will keep you informed of any changes. Your Social Worker's name and contact numbers are listed  below.  The following services may also be recommended but are not provided by the [facility] will be made to provide these services after discharge if needed.  Arrangements include referral to agencies that provide these services.  Your insurance has been verified to be:  [employer] Your primary doctor is:  Dr. Cyndi Bender  Pertinent information will be shared with your doctor and your  insurance company.  Social Worker:  Ovidio Kin, [place] or ([phone]  Information discussed with and copy given to patient by: Trey Sailors, 02/15/2018, 12:21 PM

## 2018-02-15 NOTE — Progress Notes (Signed)
Physical Therapy Session Note  Patient Details  Name: [name] MRN: [id number] Date of Birth: 25-May-1935  Today's Date: 02/15/2018 PT Individual Time: 2297-9892 PT Individual Time Calculation (min): 15 min  and Today's Date: 02/15/2018 PT Missed Time: 55 Minutes Missed Time Reason: Patient fatigue;Patient ill (Comment)  Short Term Goals: Week 1:  PT Short Term Goal 1 (Week 1): Pt will transfer bed<>chair w/ min assist PT Short Term Goal 2 (Week 1): Pt will ambulate 50' w/ LRAD w/ min assist PT Short Term Goal 3 (Week 1): Pt will follow simple, 1-step commands during functional mobility 100% of the time PT Short Term Goal 4 (Week 1): Pt will maintain dynamic sitting balance w/ supervision  Skilled Therapeutic Interventions/Progress Updates:   Attempted visit in AM, pt sleeping and refused to wake up despite max stimulation from therapist. Daughter present stating she has been c/o headache and has a low-grade fever and she may just be feeling unwell overall. Missed 60 minutes of skilled PT 2/2 refusal/ill in AM. Re-attempted visit in PM. Pt sleeping again, but easily arousable and signaling that she needed to urinate. Transferred to EOB w/ min assist and ambulated to/from toilet w/ mod assist x2 via HHA. Max verbal and visual cues for gait pattern and safety. Pt noted to have incontinent urination episode in brief. Changed brief at toilet w/ total assist and returned to EOB. Assisted pt w/ donning new gown as previous one was wet from urination. Max-total assist to change. Returned to supine and pt refusing further therapy, fell asleep quickly in supine. Ended session in supine, call bell within reach and all needs met. Caregiver present. Missed 45 minutes overall of skilled PT this date.   Therapy Documentation Precautions:  Precautions Precautions: Fall Restrictions Weight Bearing Restrictions: No General: PT Amount of Missed Time (min): 45 Minutes PT Missed Treatment Reason:  Patient fatigue;Patient ill (Comment) Vital Signs: Therapy Vitals Temp: 98 F (36.7 C) Temp Source: Oral Pulse Rate: 77 Resp: 18 BP: 139/65 Patient Position (if appropriate): Lying Oxygen Therapy SpO2: 99 % O2 Device: Room Air  See Function Navigator for Current Functional Status.   Therapy/Group: Individual Therapy  Katria Botts K Arnette 02/15/2018, 4:44 PM

## 2018-02-15 NOTE — Discharge Instructions (Addendum)
Inpatient Rehab Discharge Instructions  [name] Discharge date and time:  03/01/18  Activities/Precautions/ Functional Status: Activity: no lifting, driving, or strenuous exercise till cleared by MD Diet: diabetic diet and low fat, low cholesterol diet Wound Care: none needed    Functional status:  ___ No restrictions     ___ Walk up steps independently _X__ 24/7 supervision/assistance   ___ Walk up steps with assistance ___ Intermittent supervision/assistance  ___ Bathe/dress independently ___ Walk with walker     _X__ Bathe/dress with assistance ___ Walk Independently    ___ Shower independently ___ Walk with assistance    ___ Shower with assistance _X__ No alcohol     ___ Return to work/school ________   Special Instructions: 1. Offer fluids/shakes/food when awake. 2. Perform foley care twice a day.  3. Keep a record of intake and output.  Empty foley bag daily.  COMMUNITY REFERRALS UPON DISCHARGE:    Home Health:   PT, OT, RN, AIDE, SW  Agency:[facility]   Date of last service:03/02/2018 Medical Equipment/Items [place]  Agency/Supplier:[facility]  [phone] Other:HIRED AIDE FOR DAY TIME-8 HOURS   STROKE/TIA DISCHARGE INSTRUCTIONS SMOKING Cigarette smoking nearly doubles your risk of having a stroke & is the single most alterable risk factor  If you smoke or have smoked in the last 12 months, you are advised to quit smoking for your health.  Most of the excess cardiovascular risk related to smoking disappears within a year of stopping.  Ask you doctor about anti-smoking medications  [place] Quit Line: 1-800-QUIT NOW  Free Smoking Cessation Classes [phone]  CHOLESTEROL Know your levels; limit fat & cholesterol in your diet  Lipid Panel     Component Value Date/Time   CHOL 193 02/10/2018 0428   TRIG 125 02/10/2018 0428   HDL 37 (L) 02/10/2018 0428   CHOLHDL 5.2 02/10/2018 0428   VLDL 25 02/10/2018 0428   LDLCALC 131 (H) 02/10/2018 0428      Many patients benefit from treatment even if their cholesterol is at goal.  Goal: Total Cholesterol (CHOL) less than 160  Goal:  Triglycerides (TRIG) less than 150  Goal:  HDL greater than 40  Goal:  LDL (LDLCALC) less than 100   BLOOD PRESSURE American Stroke Association blood pressure target is less that 120/80 mm/Hg  Your discharge blood pressure is:  BP: 117/68  Monitor your blood pressure  Limit your salt and alcohol intake  Many individuals will require more than one medication for high blood pressure  DIABETES (A1c is a blood sugar average for last 3 months) Goal HGBA1c is under 7% (HBGA1c is blood sugar average for last 3 months)  Diabetes:     Lab Results  Component Value Date   HGBA1C 8.6 (H) 02/10/2018     Your HGBA1c can be lowered with medications, healthy diet, and exercise.  Check your blood sugar as directed by your physician  Call your physician if you experience unexplained or low blood sugars.  PHYSICAL ACTIVITY/REHABILITATION Goal is 30 minutes at least 4 days per week  Activity: No driving, Therapies: see above Return to work: N/A  Activity decreases your risk of heart attack and stroke and makes your heart stronger.  It helps control your weight and blood pressure; helps you relax and can improve your mood.  Participate in a regular exercise program.  Talk with your doctor about the best form of exercise for you (dancing,  walking, swimming, cycling).  DIET/WEIGHT Goal is to maintain a healthy weight  Your discharge diet is: Diet Carb Modified Fluid consistency: Thin; Room service appropriate? Yes  liquids Your height is:   Your current weight is: Weight: 60.1 kg (132 lb 7.9 oz) Your Body Mass Index (BMI) is:  BMI (Calculated): 23.48  Following the type of diet specifically designed for you will help prevent another stroke.  You are at goal weight       Your goal Body Mass Index (BMI) is 19-24.  Healthy food habits can help reduce 3 risk factors for stroke:  High cholesterol, hypertension, and excess weight.  RESOURCES Stroke/Support Group:  Call [phone]   STROKE EDUCATION PROVIDED/REVIEWED AND GIVEN TO PATIENT Stroke warning signs and symptoms How to activate emergency medical system (call 911). Medications prescribed at discharge. Need for follow-up after discharge. Personal risk factors for stroke. Pneumonia vaccine given:  Flu vaccine given:  My questions have been answered, the writing is legible, and I understand these instructions.  I will adhere to these goals & educational materials that have been provided to me after my discharge from the hospital.     My questions have been answered and I understand these instructions. I will adhere to these goals and the provided educational materials after my discharge from the hospital.  Patient/Caregiver Signature _______________________________ Date __________  Clinician Signature _______________________________________ Date __________  Please bring this form and your medication list with you to all your follow-up doctor's appointments.    Information on my medicine - ELIQUIS (apixaban)  This medication education was reviewed with me or my healthcare representative as part of my discharge preparation.    Why was Eliquis prescribed for you? Eliquis was prescribed for you to reduce the risk of a blood clot forming that can cause a stroke if you have a medical condition called atrial fibrillation (a type of irregular heartbeat).  What do You need to know about Eliquis ? Take your Eliquis TWICE DAILY - one tablet in the morning and one tablet in the evening with or without food. If you have difficulty swallowing the tablet whole please discuss with your pharmacist how to take the medication safely.  Take Eliquis exactly as prescribed by your doctor and DO NOT stop taking  Eliquis without talking to the doctor who prescribed the medication.  Stopping may increase your risk of developing a stroke.  Refill your prescription before you run out.  After discharge, you should have regular check-up appointments with your healthcare provider that is prescribing your Eliquis.  In the future your dose may need to be changed if your kidney function or weight changes by a significant amount or as you get older.  What do you do if you miss a dose? If you miss a dose, take it as soon as you remember on the same day and resume taking twice daily.  Do not take more than one dose of ELIQUIS at the same time to make up a missed dose.  Important Safety Information A possible side effect of Eliquis is bleeding. You should call your healthcare provider right away if you experience any of the following: ? Bleeding from an injury or your nose that does not stop. ? Unusual colored urine (red or dark brown) or unusual colored stools (red or black). ? Unusual bruising for unknown reasons. ? A serious fall or if you hit your head (even if there is no bleeding).  Some medicines may interact with Eliquis and might  increase your risk of bleeding or clotting while on Eliquis. To help avoid this, consult your healthcare provider or pharmacist prior to using any new prescription or non-prescription medications, including herbals, vitamins, non-steroidal anti-inflammatory drugs (NSAIDs) and supplements.  This website has more information on Eliquis (apixaban): http://[email or web address].com/eliquis/home

## 2018-02-15 NOTE — Progress Notes (Signed)
Speech Language Pathology Daily Session Note  Patient Details  Name: [name] MRN: [id number] Date of Birth: January 05, 1935  Today's Date: 02/15/2018 SLP Individual Time: 0930-1020 SLP Individual Time Calculation (min): 50 min  Short Term Goals: Week 1: SLP Short Term Goal 1 (Week 1): Patient will identify functional items from a field of 2 with 50% accuracy with Max A multimodal cues.  SLP Short Term Goal 2 (Week 1): Patient will answer basic yes/no questions in regards to biographical information with 50% accuracy and Max A multimodal cues.  SLP Short Term Goal 3 (Week 1): Patient will follow basic 1-step directions during functional tasks with Max A multimodal cues.  SLP Short Term Goal 4 (Week 1): Patient will complete basic and functional problem solving tasks with Max A multimodal cues.  SLP Short Term Goal 5 (Week 1): Patient will demonstrate sustained attention for 5-minute intervals during functional tasks with Max A verbal cues.  SLP Short Term Goal 6 (Week 1): Patient will self-monitor verbal errors x2 within a 45-minute session with Max A multimodal cues.   Skilled Therapeutic Interventions: Skilled treatment session focused on communication goals. Upon arrival, patient appeared lethargic and patient's daughter was feeding her breakfast. SLP facilitated session by utilizing the context of the meal to maximize comprehension in regards to answering basic questions at the word level for wants/needs.  Patient with decreased verbal output today and patient answered ~50% questions appropriately in regards to wants/needs at the word level and with gestures, however, patient unable to verbalize purposefully any other time when given choices. Patient became more lethargic throughout session and required Max A multimodal cues to open her eyes. RN aware and vitals were taken. Patient taken to the bathroom per daughters request and required Max-Total A to follow directions to complete tasks,  however, patient able to void. Daughter educated on goals of skilled SLP intervention and strategies to utilize to maximize communication. Patient left upright in wheelchair with RN and PA present. Continue with current plan of care.      Function:  Eating Eating   Modified Consistency Diet: No Eating Assist Level: Helper feeds patient           Cognition Comprehension Comprehension assist level: Understands basic 25 - 49% of the time/ requires cueing 50 - 75% of the time  Expression   Expression assist level: Expresses basis less than 25% of the time/requires cueing >75% of the time.  Social Interaction Social Interaction assist level: Interacts appropriately 25 - 49% of time - Needs frequent redirection.  Problem Solving Problem solving assist level: Solves basic less than 25% of the time - needs direction nearly all the time or does not effectively solve problems and may need a restraint for safety  Memory Memory assist level: Recognizes or recalls less than 25% of the time/requires cueing greater than 75% of the time    Pain Pain Assessment Pain Scale: Faces Faces Pain Scale: No hurt  Therapy/Group: Individual Therapy  [name] 02/15/2018, 10:38 AM

## 2018-02-15 NOTE — Plan of Care (Signed)
  Problem: RH BLADDER ELIMINATION Goal: RH STG MANAGE BLADDER WITH ASSISTANCE Description STG Manage Bladder With  Min Assistance  Outcome: Progressing   Problem: RH PAIN MANAGEMENT Goal: RH STG PAIN MANAGED AT OR BELOW PT'S PAIN GOAL Description At or below level 4  Outcome: Progressing   Problem: Consults Goal: RH STROKE PATIENT EDUCATION Description See Patient Education module for education specifics  Outcome: Not Progressing   Problem: RH BOWEL ELIMINATION Goal: RH STG MANAGE BOWEL WITH ASSISTANCE Description STG Manage Bowel with mod I Assistance.  Outcome: Not Progressing   Problem: RH SAFETY Goal: RH STG ADHERE TO SAFETY PRECAUTIONS W/ASSISTANCE/DEVICE Description STG Adhere to Safety Precautions With cues/reminders/ Assistance/Device.  Outcome: Not Progressing

## 2018-02-15 NOTE — Progress Notes (Signed)
Social Work  Social Work Assessment and Plan  Patient Details  Name: [name] MRN: [id number] Date of Birth: 1935-11-14  Today's Date: 02/15/2018  Problem List:  Patient Active Problem List   Diagnosis Date Noted  . Acute ischemic left middle cerebral artery (MCA) stroke ([place]) 02/13/2018  . DNR (do not resuscitate) discussion   . Palliative care by specialist   . Goals of care, counseling/discussion   . Sundowning   . Aphasia   . Coronary artery disease involving native coronary artery of native heart without angina pectoris   . History of CVA (cerebrovascular accident)   . Poorly controlled type 2 diabetes mellitus ([place])   . AKI (acute kidney injury) ([place])   . Agitation   . Expressive aphasia   . Stroke ([place]) 02/09/2018  . Ischemic cardiomyopathy 06/27/2014  . Weakness 06/11/2014  . CAD S/P DES to Prox Cx & SVG-D1; PTCA of Anastomotic SVG-D1 lesion 06/05/2014  . NSTEMI (non-ST elevated myocardial infarction) ([place]) 06/04/2014  . Gout 03/13/2012  . History of breast cancer   . Chronic combined systolic and diastolic congestive heart failure, NYHA class 2 ([place]) 03/12/2012  . 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 Renal artery stenosis ([place]) 03/12/2012  . Essential hypertension 03/12/2012  . CVA (cerebral vascular accident) ([place]) 03/12/2012  . DM (diabetes mellitus), type 2, uncontrolled with complications ([place]) 78/29/5621  . Chronic kidney disease (CKD), stage II (mild) 03/12/2012  . Hypothyroidism- (TSH low, Synthroid held) 03/12/2012  . Hyperlipidemia with target LDL less than 70; and statin intolerant 03/12/2012  . Bradycardia 03/12/2012  . Syncope 03/12/2012  . History of DVT (deep vein thrombosis) 03/12/2012  . DJD (degenerative joint disease) of knee   . Hx of adenomatous colonic polyps    Past Medical History:  Past Medical History:  Diagnosis Date  . Atherosclerosis of autologous  vein coronary artery bypass graft with unstable angina pectoris ([place])    CATH: 80% prox SVG-D1, 95% anastomotic SVG-D1 lesion  . CAD (coronary artery disease), native coronary artery 2004   a) 3/'08: Complicated PCI-of pLAD wtih PTCA  (failed redo PTCA) --> CABG 06/2003 for ISR LAD & D1 (LIMA-LAD, SVG-dRCA, SVG-D1);; b) 4/'07: Patent grafts, Normal RHC pressures; c) NSTEMI 9/'10: No culprit; 1/'12: recanalized LAD stent, patent grafts  . CAD S/P percutaneous coronary angioplasty 06/05/2014   a) 1/04: BMS x 2 LAD, PTCA D1 followed by unusuccessful redo PTCA D1 --> initially turned down CABG initially then CABG in 06/2003;; b) 05/2014: DES to Prox Cx & SVG-D1; PTCA of Anastomotic SVG-D1 lesion  . Cardiomyopathy secondary to chemotherapy ([place]) 07/2009   a) Combined Ischemic & Non-ischemic (Adriamycin from [facility])EF ~40% by Cath; by Echo in 08/2010 35-40%;; b) followup echo August 2013: EF 50-55%, aortic sclerosis without stenosis. Grade 1 diastolic dysfunction  . Chronic combined systolic and diastolic congestive heart failure, NYHA class 2 ([place]) 07/2009   Mixed. 2D-echocardiogram (08/2010) - 35% to 40%. Diffuse hypokinesis. Grade 2 diastolic dysfunction.  . CKD (chronic kidney disease) stage 2, GFR 60-89 ml/min    BL Cr 1.6  . Dementia   . DJD (degenerative joint disease) of knee    left  . DM (diabetes mellitus), type 2, uncontrolled with complications ([place]) DX: [id number]   Stopped of Metformin in 04/2011 in setting of AKI (Cr 3), now on insulin.  . Essential hypertension 03/12/2012  . History of breast cancer    Primary oncologist  Dr. Weinhardt. // First diagnosed 2000, recurrence in 06/2010. S/P left mastectomy with radiation therapy (2000), following recurrence, started chemothearpy, completed course (06/2010.)  . History of CVA (cerebrovascular accident) 11/2009; 8& 04/2009   left frontal lobe stroke (11/2009), TIA (06/2009, 04/2009) with multiple prior TIAs manifested by aphasia. H/o CVA in 1988  involving right eye.  . History of DVT of lower extremity 07/2003   in setting of saphenous vein grafting for CABG  . History of renal artery stenosis 2010   Left 95% stenosis s/p left renal artery stenting (09/2009), right renal artery with 50% stenosis.  . Hx of adenomatous colonic polyps    remote history. Last colonoscopy (03/2004) - Normal colonoscopy to the cecum, rec 5 year follow-up colonoscopy.  . Hyperlipidemia with target LDL less than 70   . Hypothyroidism   . S/P CABG x 3 06/2003   LIMA-LAD, SVG-D1, SVG-dRCA   Past Surgical History:  Past Surgical History:  Procedure Laterality Date  . CARDIAC CATHETERIZATION  12/10/2002   Successful stenting of LAD with Cutting Balloon angioplasty of diagonal #1. There was a residual type B dissection in first diagonal branch.   . CARDIAC CATHETERIZATION  01/14/2003   Ostial 80% narrowing of the LAD - procedure was terminated after 5 unsuccessful attempts to pass the balloon into the stents and down to the lesion.  . CARDIAC CATHETERIZATION  07/22/2003   Recommended CABG revascularization  . CARDIAC CATHETERIZATION  03/01/2006   Low, normal LV systolic function, normal pulmonary artery pressures, and patent grafts.  . CARDIAC CATHETERIZATION  02/19/2008   Continue current medications and treatment plan.  . CARDIAC CATHETERIZATION  08/19/2009   Patent grafts with normal LV function  . CARDIAC CATHETERIZATION  11/25/2010   40% EF with anterior and distal inferior wall motion abnormalities. Patient grafts. Progression of diagonal and LAD disease.  . CARDIOVASCULAR STRESS TEST  09/30/2009   No scintigraphic evidence of inducible myocardial ischemia. No ECG changes. EKG negative for ischemia.  . CORONARY ARTERY BYPASS GRAFT  07/23/2003   x3. LIMA to LAD, SVG to diagonal, SVG to RCA.  . LEFT HEART CATHETERIZATION WITH CORONARY/GRAFT ANGIOGRAM N/A 06/05/2014   Procedure: LEFT HEART CATHETERIZATION WITH CORONARY/GRAFT ANGIOGRAM;  Surgeon: David W Harding,  MD;  Location: [facility] CATH LAB;  Service: Cardiovascular;  Laterality: N/A;  . LUMBAR DISC SURGERY    . RENAL ARTERY ANGIOPLASTY Left 10/05/2009   95% renal artery stenosis. Predilated with a 4x15 Aviator balloon, stented with a 5x15 Genesis and Aviator balloon stent with dilatatin at 10 atm. Resulting in reduction of 95% to 0% with excellent flow.  . RENAL DOPPLER  10/23/2011   Bilateral Renal Artery - No evidence of significant diameter reduction, tortuosity, or any other vascular abnormality/  . SIMPLE MASTECTOMY  2000   left, in setting of breast cancer  . TOTAL ABDOMINAL HYSTERECTOMY    . TOTAL KNEE ARTHROPLASTY  12/2004, 12/2005   BL, right (2006) by Dr. Graves. Left (2007) by Dr. Lucey.  . TRANSTHORACIC ECHOCARDIOGRAM  03/13/2012   EF 50-55%, mild focal basal hypertrophy of the septum. Abnormal LV relaxation (grade 1 diastolic dysfunction).   Social History:  reports that she has never smoked. She has never used smokeless tobacco. She reports that she does not drink alcohol or use drugs.  Family / Support Systems Marital Status: Married Patient Roles: Spouse, Parent Spouse/Significant Other: Bill Children: [name]-daughter [phone]  Eddie-son [phone] Pam-daughter [phone] Other Supports: Extended family and in-laws Anticipated Caregiver: Family members and   may hire some assist to help when working and for pt's husband respite Ability/Limitations of Caregiver: All of the kids work during the day Caregiver Availability: Other (Comment)(Coming up with a 24 hr plan) Family Dynamics: Close knit family all [place], so very close and involved in each other's lives. They have church members who are supportive and visit. Mainly the family members and their children who will be the caregivers.  Social History Preferred language: English Religion: Pentecostal Cultural Background: No issues Education: High School Read: Yes Write:  Yes Employment Status: Retired Legal Hisotry/Current Legal Issues: No issues Guardian/Conservator: None-according to MD pt is not fully capable of making her own decisions while here. Have a HCPOA-[name] has been appointed   Abuse/Neglect Abuse/Neglect Assessment Can Be Completed: Unable to assess, patient is non-responsive or altered mental status  Emotional Status Pt's affect, behavior adn adjustment status: According to Pam-daughter pt has alwasy been independent and still was with her dementia. She did not cook anymore and needed to be supervised due to her memory issues. But she could go between all of their homes and use a gator to drive. Recent Psychosocial Issues: other health issues-dementia and CHF Pyschiatric History: No history deferred depression screen due to pt is not really alert and has speech deficits from her stroke now. Will see if can re-assess or just receive information from her family. According to daughter she was happy and enjoyed life. Substance Abuse History: No issues  Patient / Family Perceptions, Expectations & Goals Pt/Family understanding of illness & functional limitations: Family is here daily and stays the night with her for her confusion. They have spoken with the MD and feel they have an understanding up to this point. They are concerned aobut how she is doing today she seems out of it. Encouraged  her to talk with the RN or PA regarding medical questions. Premorbid pt/family roles/activities: Wife, Mother, grandmother, retiree, church member, etc Anticipated changes in roles/activities/participation: resume Pt/family expectations/goals: Pam states: " I hope she does better and we can get her home, that is the plan." Husband states: " We will do what she needs."  Community Resources Community Agencies: None Premorbid Home Care/DME Agencies: None Transportation available at discharge: Family Resource referrals recommended: Support group  (specify)  Discharge Planning Living Arrangements: Spouse/significant other Support Systems: Spouse/significant other, Children, Other relatives, Friends/neighbors, Church/faith community Type of Residence: Private residence Insurance Resources: Private Insurance (specify)(UHC-Medicare) Financial Resources: Social Security Financial Screen Referred: No Living Expenses: Own Money Management: Spouse Does the patient have any problems obtaining your medications?: No Home Management: Family members Patient/Family Preliminary Plans: Return home with family and hired caregiver. Family aware pt will require 24 hr care and hope she will progress and her speech will get better while here. Having medical issues today and x-rays to check her chest. Discussed with daughter will need to see how she progresses and what level she will achieve while here. Sw Barriers to Discharge: Decreased caregiver support Sw Barriers to Discharge Comments: husband only able to provide 24 hr supervision and is 82 yo Social Work Anticipated Follow Up Needs: HH/OP, Support Group  Clinical Impression Patient was doing medically better yesterday than today. Having medical issues-chest x-ray, labs, etc. Aware pt will require 24 hr supervision at discharge from rehab. Family has made a promise to pt not to place her in a NH. Family is very involved and stays the night here to support her and help with her confusion. Will   work on discharge needs and provide support to family.  ,  G 02/15/2018, 12:53 PM   

## 2018-02-15 NOTE — Patient Care Conference (Signed)
Inpatient RehabilitationTeam Conference and Plan of Care Update Date: 02/14/2018   Time: 11:25 AM    Patient Name: [name]      Medical Record Number: [id number]  Date of Birth: July 15, 1935 Sex: Female         Room/Bed: 4W18C/4W18C-01 Payor Info: Payor: Theme park manager MEDICARE / Plan: UHC MEDICARE / Product Type: *No Product type* /    Admitting Diagnosis: Stroke  Admit Date/Time:  02/13/2018  4:57 PM Admission Comments: No comment available   Primary Diagnosis:  <principal problem not specified> Principal Problem: <principal problem not specified>  Patient Active Problem List   Diagnosis Date Noted  . Acute ischemic left middle cerebral artery (MCA) stroke ([place]) 02/13/2018  . DNR (do not resuscitate) discussion   . Palliative care by specialist   . Goals of care, counseling/discussion   . Sundowning   . Aphasia   . Coronary artery disease involving native coronary artery of native heart without angina pectoris   . History of CVA (cerebrovascular accident)   . Poorly controlled type 2 diabetes mellitus ([place])   . AKI (acute kidney injury) ([place])   . Agitation   . Expressive aphasia   . Stroke ([place]) 02/09/2018  . Ischemic cardiomyopathy 06/27/2014  . Weakness 06/11/2014  . CAD S/P DES to Prox Cx & SVG-D1; PTCA of Anastomotic SVG-D1 lesion 06/05/2014  . NSTEMI (non-ST elevated myocardial infarction) ([place]) 06/04/2014  . Gout 03/13/2012  . History of breast cancer   . Chronic combined systolic and diastolic congestive heart failure, NYHA class 2 ([place]) 03/12/2012  . 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 Renal artery stenosis ([place]) 03/12/2012  . Essential hypertension 03/12/2012  . CVA (cerebral vascular accident) ([place]) 03/12/2012  . DM (diabetes mellitus), type 2, uncontrolled with complications ([place]) 22/12/5425  . Chronic kidney disease (CKD), stage II (mild) 03/12/2012  . Hypothyroidism- (TSH  low, Synthroid held) 03/12/2012  . Hyperlipidemia with target LDL less than 70; and statin intolerant 03/12/2012  . Bradycardia 03/12/2012  . Syncope 03/12/2012  . History of DVT (deep vein thrombosis) 03/12/2012  . DJD (degenerative joint disease) of knee   . Hx of adenomatous colonic polyps     Expected Discharge Date: Expected Discharge Date: 02/28/18  Team Members Present: Physician leading conference: Dr. Alysia Penna Social Worker Present: Alfonse Alpers, LCSW;Becky DuPree, LCSW Nurse Present: Dorthula Nettles, RN PT Present: Michaelene Song, PT OT Present: Cherylynn Ridges, OT;Other (comment)(Sandra Glenshaw, OT) SLP Present: Weston Anna, SLP PPS Coordinator present : Daiva Nakayama, RN, CRRN     Current Status/Progress Goal Weekly Team Focus  Medical   Severe sensory deficits on RIght side, Mod A amb, severe aphasia  reduce fall risk  initiate rehab program   Bowel/Bladder   Intermittent incontinence, Last BM unknown  Maiantain continence  Timed Toileting   Swallow/Nutrition/ Hydration             ADL's   mod A overal d/t poor propioception of R UE and motor planning  min A- (S) overall  ADL retraining, safe transfer training, NMR of R UE    Mobility   min assist bed mobility, mod assist stand pivot, mod assist gait 30 ft without rail, max assist car transfer  supervision  functional mobility, awareness, gait, safety   Communication   Eval Pending          Safety/Cognition/ Behavioral Observations  Eval Pending  Pain   Nonverbal signs of pain  Pain < 3  Assess Qshift/PRN    Skin   Skin intact  Prevent breakdown and infection       Rehab Goals Patient on target to meet rehab goals: Yes Rehab Goals Revised: none - pt's first conference *See Care Plan and progress notes for long and short-term goals.     Barriers to Discharge  Current Status/Progress Possible Resolutions Date Resolved   Physician    Medical stability     initiating therapy program   Cont rehab      Nursing                  PT  Home environment access/layout;Behavior                 OT                  SLP                SW                Discharge Planning/Teaching Needs:  Pt plans to return to her home with family to assist her and provide 24/7 supervision.  Family to receive education closer to pt's d/c.   Team Discussion:  Pt with stroke resulting in sensory ataxia, poor awareness, aphasia.  Dr. Letta Pate has started pt on a supplement for low potassium.  Pt is incontinent and nursing is starting timed toileting with her.  Pt still has saline lock in right UE.  Pt is on carb modified diet with thin liquids.  Pt did well with OT once in the routine of self care, but is very apraxic and has trouble using right hand.  OT goals are min A to supervision.  Pt was min A for bed mobility and min to mod for transfers and mod A without a device/min A with rail for ambulation.  Pt's car tx was max A due to pt having difficulty following commands.  PT has supervision level goals.  ST to evaluate later today.   Revisions to Treatment Plan:  none    Continued Need for Acute Rehabilitation Level of Care: The patient requires daily medical management by a physician with specialized training in physical medicine and rehabilitation for the following conditions: Daily direction of a multidisciplinary physical rehabilitation program to ensure safe treatment while eliciting the highest outcome that is of practical value to the patient.: Yes Daily medical management of patient stability for increased activity during participation in an intensive rehabilitation regime.: Yes Daily analysis of laboratory values and/or radiology reports with any subsequent need for medication adjustment of medical intervention for : Neurological problems  Kennedey Digilio, Silvestre Mesi 02/15/2018, 12:15 PM

## 2018-02-15 NOTE — Progress Notes (Signed)
Attempt instruction in use of Flutter valve.  Pt confused, unable to process information at this time.  Discussed with family ant bedside, and instructed in use.  Pt family to continue efforts to have pt use.  Also discussed with RN, who will continue attempts to teach as well.

## 2018-02-16 ENCOUNTER — Inpatient Hospital Stay (HOSPITAL_COMMUNITY): Payer: Medicare Other | Admitting: Speech Pathology

## 2018-02-16 ENCOUNTER — Inpatient Hospital Stay (HOSPITAL_COMMUNITY): Payer: Medicare Other | Admitting: Physical Therapy

## 2018-02-16 ENCOUNTER — Inpatient Hospital Stay (HOSPITAL_COMMUNITY): Payer: Medicare Other | Admitting: Occupational Therapy

## 2018-02-16 LAB — URINALYSIS, ROUTINE W REFLEX MICROSCOPIC
BACTERIA UA: NONE SEEN
BILIRUBIN URINE: NEGATIVE
Glucose, UA: >=500 mg/dL — AB
KETONES UR: NEGATIVE mg/dL
Leukocytes, UA: NEGATIVE
NITRITE: NEGATIVE
PROTEIN: NEGATIVE mg/dL
Specific Gravity, Urine: 1.011 (ref 1.005–1.030)
pH: 5 (ref 5.0–8.0)

## 2018-02-16 LAB — GLUCOSE, CAPILLARY
GLUCOSE-CAPILLARY: 174 mg/dL — AB (ref 65–99)
GLUCOSE-CAPILLARY: 328 mg/dL — AB (ref 65–99)
GLUCOSE-CAPILLARY: 224 mg/dL — AB (ref 65–99)
Glucose-Capillary: 287 mg/dL — ABNORMAL HIGH (ref 65–99)

## 2018-02-16 NOTE — Plan of Care (Signed)
  Problem: Consults Goal: RH STROKE PATIENT EDUCATION Description See Patient Education module for education specifics  Outcome: Progressing   Problem: RH BOWEL ELIMINATION Goal: RH STG MANAGE BOWEL WITH ASSISTANCE Description STG Manage Bowel with mod I Assistance.  Outcome: Progressing Flowsheets (Taken 02/16/2018 1243) STG: Pt will manage bowels with assistance: 6-Modified independent   Problem: RH BLADDER ELIMINATION Goal: RH STG MANAGE BLADDER WITH ASSISTANCE Description STG Manage Bladder With  Min Assistance  Outcome: Progressing Flowsheets (Taken 02/16/2018 1243) STG: Pt will manage bladder with assistance: 6-Modified independent   Problem: RH PAIN MANAGEMENT Goal: RH STG PAIN MANAGED AT OR BELOW PT'S PAIN GOAL Description At or below level 4  Outcome: Progressing   Problem: RH KNOWLEDGE DEFICIT Goal: RH STG INCREASE KNOWLEDGE OF DIABETES Description Family will be able to verbalize understanding of diabetes management, CBG monitoring and medications indepently with cues/reminders/resources provided  Outcome: Progressing Goal: RH STG INCREASE KNOWLEDGE OF HYPERTENSION Description Family will be able to state and demo understanding of HTN, management, medications and dietary restrictions independendly using cues/resources and handouts provided  Outcome: Progressing

## 2018-02-16 NOTE — Progress Notes (Signed)
Speech Language Pathology Daily Session Note  Patient Details  Name: [name] MRN: [id number] Date of Birth: 02/28/1935  Today's Date: 02/16/2018 SLP Individual Time: 1400-1445 SLP Individual Time Calculation (min): 45 min  Short Term Goals: Week 1: SLP Short Term Goal 1 (Week 1): Patient will identify functional items from a field of 2 with 50% accuracy with Max A multimodal cues.  SLP Short Term Goal 2 (Week 1): Patient will answer basic yes/no questions in regards to biographical information with 50% accuracy and Max A multimodal cues.  SLP Short Term Goal 3 (Week 1): Patient will follow basic 1-step directions during functional tasks with Max A multimodal cues.  SLP Short Term Goal 4 (Week 1): Patient will complete basic and functional problem solving tasks with Max A multimodal cues.  SLP Short Term Goal 5 (Week 1): Patient will demonstrate sustained attention for 5-minute intervals during functional tasks with Max A verbal cues.  SLP Short Term Goal 6 (Week 1): Patient will self-monitor verbal errors x2 within a 45-minute session with Max A multimodal cues.   Skilled Therapeutic Interventions: Skilled treatment session focused on communication goals. SLP facilitated session by providing a familiar context to maximize patient's overall comprehension. Patient able to choose appropriate item from a field of 2 in 100% of opportunities and followed basic directions with folding task. Patient continues to demonstrate fluent jargon, however, patient did demonstrate increased "islands" of appropriate language. Patient also demonstrated increased appropriate social responses during a basic conversation. SLP also provided audio feedback in attempts to improve awareness without success. Patient left upright in wheelchair with family present. Continue with current plan of care.     Function:   Cognition Comprehension Comprehension assist level: Understands basic less than 25% of the time/  requires cueing >75% of the time  Expression   Expression assist level: Expresses basis less than 25% of the time/requires cueing >75% of the time.  Social Interaction Social Interaction assist level: Interacts appropriately less than 25% of the time. May be withdrawn or combative.  Problem Solving Problem solving assist level: Solves basic less than 25% of the time - needs direction nearly all the time or does not effectively solve problems and may need a restraint for safety  Memory Memory assist level: Recognizes or recalls less than 25% of the time/requires cueing greater than 75% of the time    Pain Pain Assessment Pain Scale: 0-10 Pain Score: 0-No pain  Therapy/Group: Individual Therapy  Saleena Tamas 02/16/2018, 2:59 PM

## 2018-02-16 NOTE — Progress Notes (Signed)
Subjective/Complaints: Husband states " I can't hear her"  We discussed aphasia as it relates to pt communication, discussed ELOS ROS- cannot obtain due to aphasia Objective: Vital Signs: Blood pressure 140/61, pulse 73, temperature (!) 97.4 F (36.3 C), temperature source Oral, resp. rate 18, weight 60 kg (132 lb 4.4 oz), SpO2 99 %. Dg Chest 1 View  Result Date: 02/15/2018 CLINICAL DATA:  Fever and confusion. The patient was unable to tolerate positioning for the lateral view. EXAM: CHEST  1 VIEW COMPARISON:  PA and lateral chest x-ray of February 09, 2018. FINDINGS: The lungs are less well inflated today. The interstitial markings are coarse. There is mild elevation of the left hemidiaphragm with gaseous distention of bowel or the stomach deep to it. The heart is normal in size. The pulmonary vascularity is not clearly engorged. There are post CABG changes. There is calcification in the wall of the aortic arch. IMPRESSION: Mild bilateral hypoinflation which accentuates the interstitial markings. I cannot exclude a pneumonitis type process or low-grade interstitial edema. No alveolar pneumonia. Electronically Signed   By: David  Martinique M.D.   On: 02/15/2018 12:35   Results for orders placed or performed during the hospital encounter of 02/13/18 (from the past 72 hour(s))  Glucose, capillary     Status: Abnormal   Collection Time: 02/13/18  8:54 PM  Result Value Ref Range   Glucose-Capillary 206 (H) 65 - 99 mg/dL  Glucose, capillary     Status: Abnormal   Collection Time: 02/14/18  6:36 AM  Result Value Ref Range   Glucose-Capillary 172 (H) 65 - 99 mg/dL  CBC with Differential/Platelet     Status: Abnormal   Collection Time: 02/14/18  7:11 AM  Result Value Ref Range   WBC 7.1 4.0 - 10.5 K/uL   RBC 4.47 3.87 - 5.11 MIL/uL   Hemoglobin 14.9 12.0 - 15.0 g/dL   HCT 41.3 36.0 - 46.0 %   MCV 92.4 78.0 - 100.0 fL   MCH 33.3 26.0 - 34.0 pg   MCHC 36.1 (H) 30.0 - 36.0 g/dL   RDW 13.1 11.5 - 15.5 %    Platelets 163 150 - 400 K/uL   Neutrophils Relative % 55 %   Neutro Abs 3.9 1.7 - 7.7 K/uL   Lymphocytes Relative 37 %   Lymphs Abs 2.6 0.7 - 4.0 K/uL   Monocytes Relative 6 %   Monocytes Absolute 0.5 0.1 - 1.0 K/uL   Eosinophils Relative 2 %   Eosinophils Absolute 0.1 0.0 - 0.7 K/uL   Basophils Relative 0 %   Basophils Absolute 0.0 0.0 - 0.1 K/uL    Comment: Performed at [facility], [address], [place], [place] [address]  Comprehensive metabolic panel     Status: Abnormal   Collection Time: 02/14/18  7:11 AM  Result Value Ref Range   Sodium 140 135 - 145 mmol/L   Potassium 3.2 (L) 3.5 - 5.1 mmol/L   Chloride 106 101 - 111 mmol/L   CO2 23 22 - 32 mmol/L   Glucose, Bld 177 (H) 65 - 99 mg/dL   BUN 12 6 - 20 mg/dL   Creatinine, Ser 1.12 (H) 0.44 - 1.00 mg/dL   Calcium 9.1 8.9 - 10.3 mg/dL   Total Protein 6.4 (L) 6.5 - 8.1 g/dL   Albumin 3.3 (L) 3.5 - 5.0 g/dL   AST 24 15 - 41 U/L   ALT 19 14 - 54 U/L   Alkaline Phosphatase 75 38 - 126  U/L   Total Bilirubin 1.0 0.3 - 1.2 mg/dL   GFR calc non Af Amer 45 (L) >60 mL/min   GFR calc Af Amer 52 (L) >60 mL/min    Comment: (NOTE) The eGFR has been calculated using the CKD EPI equation. This calculation has not been validated in all clinical situations. eGFR's persistently <60 mL/min signify possible Chronic Kidney Disease.    Anion gap 11 5 - 15    Comment: Performed at [facility] [address], [place], [place] [address]  Glucose, capillary     Status: Abnormal   Collection Time: 02/14/18 11:29 AM  Result Value Ref Range   Glucose-Capillary 243 (H) 65 - 99 mg/dL  Glucose, capillary     Status: Abnormal   Collection Time: 02/14/18  4:38 PM  Result Value Ref Range   Glucose-Capillary 262 (H) 65 - 99 mg/dL  Glucose, capillary     Status: Abnormal   Collection Time: 02/14/18  8:51 PM  Result Value Ref Range   Glucose-Capillary 234 (H) 65 - 99 mg/dL  Glucose, capillary     Status: Abnormal   Collection  Time: 02/15/18  6:47 AM  Result Value Ref Range   Glucose-Capillary 179 (H) 65 - 99 mg/dL  CBC with Differential/Platelet     Status: None   Collection Time: 02/15/18 10:36 AM  Result Value Ref Range   WBC 8.2 4.0 - 10.5 K/uL   RBC 4.38 3.87 - 5.11 MIL/uL   Hemoglobin 13.9 12.0 - 15.0 g/dL   HCT 41.2 36.0 - 46.0 %   MCV 94.1 78.0 - 100.0 fL   MCH 31.7 26.0 - 34.0 pg   MCHC 33.7 30.0 - 36.0 g/dL   RDW 13.0 11.5 - 15.5 %   Platelets 164 150 - 400 K/uL   Neutrophils Relative % 75 %   Neutro Abs 6.2 1.7 - 7.7 K/uL   Lymphocytes Relative 18 %   Lymphs Abs 1.5 0.7 - 4.0 K/uL   Monocytes Relative 6 %   Monocytes Absolute 0.5 0.1 - 1.0 K/uL   Eosinophils Relative 1 %   Eosinophils Absolute 0.1 0.0 - 0.7 K/uL   Basophils Relative 0 %   Basophils Absolute 0.0 0.0 - 0.1 K/uL    Comment: Performed at [facility], [place] [address] [place], [place] [address]  Glucose, capillary     Status: Abnormal   Collection Time: 02/15/18 11:29 AM  Result Value Ref Range   Glucose-Capillary 404 (H) 65 - 99 mg/dL   Comment 1 Notify RN   Glucose, capillary     Status: Abnormal   Collection Time: 02/15/18 12:50 PM  Result Value Ref Range   Glucose-Capillary 337 (H) 65 - 99 mg/dL  Glucose, capillary     Status: Abnormal   Collection Time: 02/15/18  4:44 PM  Result Value Ref Range   Glucose-Capillary 253 (H) 65 - 99 mg/dL   Comment 1 Notify RN   Glucose, capillary     Status: Abnormal   Collection Time: 02/15/18 10:13 PM  Result Value Ref Range   Glucose-Capillary 185 (H) 65 - 99 mg/dL   Comment 1 Notify RN   Glucose, capillary     Status: Abnormal   Collection Time: 02/16/18  6:44 AM  Result Value Ref Range   Glucose-Capillary 174 (H) 65 - 99 mg/dL   Comment 1 Notify RN      HEENT: normal Cardio: RRR and no murmur Resp: CTA B/L and no wheezes GI:  BS positive and NT, ND Extremity:  No Edema Skin:   Bruise forearms Neuro: Abnormal Sensory reduced sensation to pinch Right Upper and  Lower limb, Abnormal Motor 3/5 RUE and RLE, Abnormal FMC Ataxic/ dec FMC and Aphasic Musc/Skel:  Other no pain with UE or LE ROM Gen NAD   Assessment/Plan: 1. Functional deficits secondary to Left thalamic and Left parietal infarct which require 3+ hours per day of interdisciplinary therapy in a comprehensive inpatient rehab setting. Physiatrist is providing close team supervision and 24 hour management of active medical problems listed below. Physiatrist and rehab team continue to assess barriers to discharge/monitor patient progress toward functional and medical goals. FIM: Function - Bathing Position: Shower Body parts bathed by patient: Left upper leg, Right upper leg, Chest, Abdomen, Front perineal area, Right lower leg, Left lower leg Body parts bathed by helper: Buttocks, Back, Right arm, Left arm Bathing not applicable: Buttocks Assist Level: Touching or steadying assistance(Pt > 75%)  Function- Upper Body Dressing/Undressing What is the patient wearing?: Pull over shirt/dress Pull over shirt/dress - Perfomed by patient: Thread/unthread right sleeve, Thread/unthread left sleeve, Put head through opening, Pull shirt over trunk Assist Level: Touching or steadying assistance(Pt > 75%) Function - Lower Body Dressing/Undressing What is the patient wearing?: Non-skid slipper socks Position: Wheelchair/chair at sink Non-skid slipper socks- Performed by patient: Don/doff right sock Non-skid slipper socks- Performed by helper: Don/doff left sock Assist for footwear: Partial/moderate assist  Function - Toileting Toileting steps completed by patient: Performs perineal hygiene Toileting steps completed by helper: Adjust clothing prior to toileting, Adjust clothing after toileting Toileting Assistive Devices: Grab bar or rail Assist level: Touching or steadying assistance (Pt.75%)  Function - Air cabin crew transfer assistive device: Elevated toilet seat/BSC over toilet Assist  level to toilet: Maximal assist (Pt 25 - 49%/lift and lower) Assist level from toilet: Maximal assist (Pt 25 - 49%/lift and lower) Assist level to bedside commode (at bedside): 2 helpers Assist level from bedside commode (at bedside): 2 helpers  Function - Chair/bed transfer Chair/bed transfer method: Ambulatory Chair/bed transfer assist level: Moderate assist (Pt 50 - 74%/lift or lower) Chair/bed transfer assistive device: Armrests Chair/bed transfer details: Verbal cues for technique, Manual facilitation for placement, Manual facilitation for weight shifting, Tactile cues for sequencing, Tactile cues for initiation, Visual cues/gestures for sequencing, Tactile cues for placement, Tactile cues for posture  Function - Locomotion: Wheelchair Will patient use wheelchair at discharge?: No Function - Locomotion: Ambulation Assistive device: No device Max distance: 15' Assist level: Moderate assist (Pt 50 - 74%) Assist level: Moderate assist (Pt 50 - 74%) Walk 50 feet with 2 turns activity did not occur: Safety/medical concerns Walk 150 feet activity did not occur: Safety/medical concerns Walk 10 feet on uneven surfaces activity did not occur: Safety/medical concerns  Function - Comprehension Comprehension: Auditory Comprehension assist level: Understands basic 25 - 49% of the time/ requires cueing 50 - 75% of the time  Function - Expression Expression: Verbal Expression assist level: Expresses basis less than 25% of the time/requires cueing >75% of the time.  Function - Social Interaction Social Interaction assist level: Interacts appropriately 25 - 49% of time - Needs frequent redirection.  Function - Problem Solving Problem solving assist level: Solves basic less than 25% of the time - needs direction nearly all the time or does not effectively solve problems and may need a restraint for safety  Function - Memory Memory assist level: Recognizes or recalls less than 25% of the  time/requires cueing  greater than 75% of the time Patient normally able to recall (first 3 days only): None of the above  Medical Problem List and Plan:  1. Functional deficits secondary to infarcts in left thalamus and left parietal periventricular white matter  -admit to inpatient rehab  PT, OT, SLP CIR level 2. DVT Prophylaxis/Anticoagulation: Pharmaceutical: Lovenox and Other (comment)  3. Pain Management: tylenol prn  4. Mood: Patient currently with poor awareness of deficits. LCSW to follow for evaluation when appropriate.  -pt has demonstrating increased confusion and agitation at night, poor sleep patterns  -check sleep chart  -begin scheduled, low dose seroquel  5. Neuropsych: This patient is not capable of making decisions on her own behalf.  6. Skin/Wound Care: routine pressure relief measures  7. Fluids/Electrolytes/Nutrition: Monitor I/O.354m fluid on 3/28 Offer supplements if intake poor.  8. Chronic combined systolic/diastolic CHF/chemo induced CM: Heart healthy diet. Monitor for signs of overload and check weights daily. BB discontinued due to bradycardia. Continue Lisinopril daily. Used lasix prn edema at home.  Vitals:   02/15/18 1412 02/16/18 0606  BP: 139/65 140/61  Pulse: 77 73  Resp: 18 18  Temp: 98 F (36.7 C) (!) 97.4 F (36.3 C)  SpO2: 99% 99%   9. CAD w/p PTCA: On ASA and Zetia.  10. CKD: Avoid nephrotoxic medications. Monitor with serial checks.  11.T2DM: Hgb A1C- 8.6. Blood sugars poorly controlled.--Used Lantus 80 units with metformin at nights. Lantus to 15 units daily and titrate as needed. Monitor BS ac/hs. Improving 3/29 CBG (last 3)  Recent Labs    02/15/18 1644 02/15/18 2213 02/16/18 0644  GLUCAP 253* 185* 174*  12. Hypothyroid: On supplement.     LOS (Days) 3 A FACE TO FACE EVALUATION WAS PERFORMED  ACharlett Blake3/29/2019, 7:18 AM

## 2018-02-16 NOTE — Progress Notes (Signed)
Physical Therapy Session Note  Patient Details  Name: [name] MRN: [id number] Date of Birth: [birth year]  Today's Date: 02/16/2018 PT Individual Time: 1005-1100 PT Individual Time Calculation (min): 55 min   Short Term Goals: Week 1:  PT Short Term Goal 1 (Week 1): Pt will transfer bed<>chair w/ min assist PT Short Term Goal 2 (Week 1): Pt will ambulate 50' w/ LRAD w/ min assist PT Short Term Goal 3 (Week 1): Pt will follow simple, 1-step commands during functional mobility 100% of the time PT Short Term Goal 4 (Week 1): Pt will maintain dynamic sitting balance w/ supervision  Skilled Therapeutic Interventions/Progress Updates:   Pt in supine and agreeable to therapy, daughter present throughout session. Pt intermittently rubbing front of head and grimacing, made RN aware. Unable to determine pain level 2/2 aphasia. Worked on overall tolerance to OOB activity, command following w/ functional mobility, and sequencing of functional motor tasks. Transferred to EOB w/ min assist and provided max assist to don housecoat over nightgown. Transferred to w/c via squat pivot w/ max assist. All squat pivot transfers w/ max assist this session and increased time 2/2 multiple attempts being made, max verbal/visual/tactile cues for technique, sequencing, and initiation. Pt inconsistently following 1-step commands, 20-25% of time. Total assist w/c transport to/from gym and worked on gait w/ unilateral HHA on hemi side for safety. Pt w/ perceived increased fear of ambulating and all standing activity this session, required max reassurance. Ambulated 10' w/ mod assist and sat on mat. Transferred back to w/c and spent time calming patient. Pt seemed to be wanting to toilet, she was trying to remove brief. Returned to room and performed toilet transfer, max assist and total assist for brief management and pericare. Pt w/ continent BM and urination. Ended session in w/c, and in care of family, all needs met.  Educated family on importance of engaging pt in upright, sitting activity to increase tolerance to upright and promote more normal sleep/wake cycles as pt has been sleeping most of days while on rehab. All verbalized understanding. QRB engaged.   Therapy Documentation Precautions:  Precautions Precautions: Fall Restrictions Weight Bearing Restrictions: No Pain: Pain Assessment Pain Scale: 0-10 Pain Score: 0-No pain  See Function Navigator for Current Functional Status.   Therapy/Group: Individual Therapy  Deedra Pro K Arnette 02/16/2018, 12:13 PM

## 2018-02-16 NOTE — Progress Notes (Signed)
Occupational Therapy Session Note  Patient Details  Name: [name] MRN: [id number] Date of Birth: [birth year]  Today's Date: 02/16/2018 OT Individual Time: 1300-1400 OT Individual Time Calculation (min): 60 min   Short Term Goals: Week 1:  OT Short Term Goal 1 (Week 1): Pt will safely transfer from w/c to toilet with min A  OT Short Term Goal 2 (Week 1): Pt will complete oral hygiene with min multimodal cues OT Short Term Goal 3 (Week 1): Pt will complete seated level LB dressing with stand by assist  Skilled Therapeutic Interventions/Progress Updates:    Pt greeted semi-reclined in bed with daughters present, stating pt acting like she needed to go to the bathroom. Stand-pivot bed>wc with max A to power up and facilitation to pivot. Pt brought to commode and needed max A to transfer on/off commode with hand over hand A for placement of R UE 2/2 apraxia and proprioception deficits. Pt unable to void. Stand-pivot back to wc, then to tub bench in similar fashion. Pt not following one-step commands to assist with bathing today requiring total A to wash body. Hand over hand A to integrate R UE functionally into bathing tasks. Stand-pivot out of shower with max A and facilitation at trunk for head/hips relationship. Pt brought to sink in wc and worked on [place] with pt assisted some with pulling shirt overhead. Provided hand over hand A to hold brush and try to comb hair with R UE. Pt would not initiate hair brushing and needed max A overall. Provided pt with R elbow pad to help protect UE. Pt left seated in wc with safety belt on and family present in preparation for upcomming SLP session.   Therapy Documentation Precautions:  Precautions Precautions: Fall Restrictions Weight Bearing Restrictions: No Pain: Pain Assessment Pain Scale: 0-10 Pain Score: 0-No pain  See Function Navigator for Current Functional Status.   Therapy/Group: Individual Therapy  Valma Cava 02/16/2018,  2:10 PM

## 2018-02-16 NOTE — IPOC Note (Addendum)
Overall Plan of Care [facility]) Patient Details Name: [name] MRN: [id number] DOB: 26-Apr-1935  Admitting Diagnosis: <principal problem not specified>  Hospital Problems: Active Problems:   Aphasia   Poorly controlled type 2 diabetes mellitus ([place])   Acute ischemic left middle cerebral artery (MCA) stroke ([place])   Sundowning     Functional Problem List: Nursing Bladder, Bowel, Safety  PT Balance, Behavior, Endurance, Motor, Pain, Perception, Safety  OT Balance, Safety, Behavior, Sensory, Cognition, Vision, Endurance, Motor, Perception  SLP Cognition, Perception, Safety  TR         Basic ADL's: OT Eating, Grooming, Bathing, Dressing, Toileting     Advanced  ADL's: OT       Transfers: PT Bed to Chair, Bed Mobility, Car, Furniture, Floor  OT Toilet, Metallurgist: PT Ambulation, Emergency planning/management officer, Stairs     Additional Impairments: OT Fuctional Use of Upper Extremity  SLP Communication, Social Cognition comprehension, expression Problem Solving, Memory, Awareness, Attention  TR      Anticipated Outcomes Item Anticipated Outcome  Self Feeding (S)  Swallowing  N/A   Basic self-care  min A- (S)  Toileting  (S)   Bathroom Transfers (S)  Bowel/Bladder  Min assist   Transfers  supervision  Locomotion  supervision  Communication  Mod A  Cognition  Mod A  Pain  less than 3 out of 10 (face scale)  Safety/Judgment  Min assist   Therapy Plan: PT Intensity: Minimum of 1-2 x/day ,45 to 90 minutes PT Frequency: 5 out of 7 days PT Duration Estimated Length of Stay: 2 weeks OT Intensity: Minimum of 1-2 x/day, 45 to 90 minutes OT Frequency: 5 out of 7 days OT Duration/Estimated Length of Stay: 14 days SLP Intensity: Minumum of 1-2 x/day, 30 to 90 minutes SLP Frequency: 3 to 5 out of 7 days SLP Duration/Estimated Length of Stay: 02/28/2018    Team Interventions: Nursing Interventions Patient/Family Education, Bowel Management, Bladder  Management, Medication Management, Discharge Planning  PT interventions Ambulation/gait training, Discharge planning, Functional mobility training, Therapeutic Activities, Visual/perceptual remediation/compensation, Training and development officer, Neuromuscular re-education, Therapeutic Exercise, Wheelchair propulsion/positioning, Cognitive remediation/compensation, DME/adaptive equipment instruction, Pain management, Splinting/orthotics, UE/LE Strength taining/ROM, Community reintegration, Technical sales engineer stimulation, Patient/family education, IT trainer, UE/LE Coordination activities  OT Interventions Cognitive remediation/compensation, DME/adaptive equipment instruction, Self Care/advanced ADL retraining, Therapeutic Activities, UE/LE Coordination activities, Visual/perceptual remediation/compensation, UE/LE Strength taining/ROM, Wheelchair propulsion/positioning, Patient/family education, Therapeutic Exercise, Neuromuscular re-education, Training and development officer, Discharge planning, Functional electrical stimulation  SLP Interventions Cognitive remediation/compensation, Environmental controls, Multimodal communication approach, Therapeutic Activities, Functional tasks, Cueing hierarchy, Internal/external aids, Patient/family education, Speech/Language facilitation  TR Interventions    SW/CM Interventions Discharge Planning, Psychosocial Support, Patient/Family Education   Barriers to Discharge MD  Medical stability, Incontinence and aphasia  Nursing      PT Home environment access/layout, Behavior    OT      SLP      SW Decreased caregiver support husband only able to provide 24 hr supervision and is 81 yo   Team Discharge Planning: Destination: PT-Home ,OT- Home , SLP-Home Projected Follow-up: PT-Home health PT, 24 hour supervision/assistance, OT-  Home health OT, SLP-24 hour supervision/assistance, Home Health SLP Projected Equipment Needs: PT-To be determined, OT- To be  determined, SLP-None recommended by SLP Equipment Details: PT- , OT-  Patient/family involved in discharge planning: PT- Family member/caregiver,  OT-Patient, Family member/caregiver, SLP-Family member/caregiver, Patient  MD ELOS: 14-19d Medical Rehab Prognosis:  Good Assessment:  83 y.o. female with history of CAD, chemo  induced CM, prior stroke, dementia who was admitted on 02/09/18 with confusion and right sided weakness. CT head reviewed, unremarkable for acute intracranial process. Per report, MRI brain showing limited by motion artifact but showed possible acute early subacute infarcts in left thalamus and left parietal periventricular white matter. 2D echo showed EF 25% with septal, Mild AS and apical akinesis of inferior wall. EEG done due to ongoing issues with confusion and agitation a and showed intermittent frontal slowing. Dr. Erlinda Hong recommended starting [place] on 3/26 as not a candidate for ASA/Br[place] ---to repeat echo in 2-3 months and anticoagulation can be d/ced if EF > 35%. BLE dopplers were negative for DVT. Family trying wanted to wait starting DOAC.  She was treated with IV antibiotics X 3 days due to concerns of UTI. Has required sitters due to fall risk and safety concerns. Has issues with day night disruption. Palliative care consulted for [place] and family elected on DNR   Now requiring 24/7 Rehab RN,MD, as well as CIR level PT, OT and SLP.  Treatment team will focus on ADLs and mobility with goals set at [place] See Team Conference Notes for weekly updates to the plan of care

## 2018-02-16 NOTE — Progress Notes (Addendum)
Speech Language Pathology Daily Session Note  Patient Details  Name: [name] MRN: [id number] Date of Birth: 03/04/1935  Today's Date: 02/16/2018 SLP Individual Time: 9702-6378 SLP Individual Time Calculation (min): 30 min  Short Term Goals: Week 1: SLP Short Term Goal 1 (Week 1): Patient will identify functional items from a field of 2 with 50% accuracy with Max A multimodal cues.  SLP Short Term Goal 2 (Week 1): Patient will answer basic yes/no questions in regards to biographical information with 50% accuracy and Max A multimodal cues.  SLP Short Term Goal 3 (Week 1): Patient will follow basic 1-step directions during functional tasks with Max A multimodal cues.  SLP Short Term Goal 4 (Week 1): Patient will complete basic and functional problem solving tasks with Max A multimodal cues.  SLP Short Term Goal 5 (Week 1): Patient will demonstrate sustained attention for 5-minute intervals during functional tasks with Max A verbal cues.  SLP Short Term Goal 6 (Week 1): Patient will self-monitor verbal errors x2 within a 45-minute session with Max A multimodal cues.   Skilled Therapeutic Interventions:  Pt was seen for skilled ST targeting communication goals and ongoing family education.  Pt's daughter, [name], was present throughout therapy session.  Pt needed max visual and gestural cues when transferring from bed to wheelchair to maximize attention and alertness during therapy session.  Pt continues to have fluent streams of jargon with no awareness of verbal errors; however, pt did have multiple instances of mirroring therapist's facial expressions and body language.  Pt needed max assist to total assist multmiodal cues for redirection to task when feeding herself her breakfast meal.  Discussed realistic prognostic expectations with pt's daughter and provided skilled education regarding techniques to maximize pt's cognitive-linguistic function.  As therapist was departing, pt waved and said  "good bye" in response.  Pt was left with daughter at bedside in wheelchair with chair alarm set and quick release belt donned.  Continue per current plan of care.    Function:  Eating Eating   Modified Consistency Diet: No Eating Assist Level: Supervision or verbal cues;Helper scoops food on utensil;Helper feeds patient           Cognition Comprehension Comprehension assist level: Understands basic less than 25% of the time/ requires cueing >75% of the time  Expression   Expression assist level: Expresses basis less than 25% of the time/requires cueing >75% of the time.  Social Interaction Social Interaction assist level: Interacts appropriately 25 - 49% of time - Needs frequent redirection.  Problem Solving Problem solving assist level: Solves basic less than 25% of the time - needs direction nearly all the time or does not effectively solve problems and may need a restraint for safety  Memory Memory assist level: Recognizes or recalls less than 25% of the time/requires cueing greater than 75% of the time    Pain Pain Assessment Pain Scale: 0-10 Pain Score: 0-No pain  Therapy/Group: Individual Therapy  Carlethia Mesquita, Selinda Orion 02/16/2018, 1:02 PM

## 2018-02-17 ENCOUNTER — Inpatient Hospital Stay (HOSPITAL_COMMUNITY): Payer: Medicare Other | Admitting: Physical Therapy

## 2018-02-17 ENCOUNTER — Inpatient Hospital Stay (HOSPITAL_COMMUNITY): Payer: Medicare Other | Admitting: Occupational Therapy

## 2018-02-17 ENCOUNTER — Inpatient Hospital Stay (HOSPITAL_COMMUNITY): Payer: Medicare Other | Admitting: Speech Pathology

## 2018-02-17 LAB — URINE CULTURE

## 2018-02-17 LAB — GLUCOSE, CAPILLARY
GLUCOSE-CAPILLARY: 255 mg/dL — AB (ref 65–99)
GLUCOSE-CAPILLARY: 285 mg/dL — AB (ref 65–99)
GLUCOSE-CAPILLARY: 326 mg/dL — AB (ref 65–99)
Glucose-Capillary: 351 mg/dL — ABNORMAL HIGH (ref 65–99)

## 2018-02-17 MED ORDER — INSULIN GLARGINE 100 UNIT/ML ~~LOC~~ SOLN
15.0000 [IU] | Freq: Every day | SUBCUTANEOUS | Status: DC
  Administered 2018-02-17: 15 [IU] via SUBCUTANEOUS
  Filled 2018-02-17: qty 0.15

## 2018-02-17 MED ORDER — QUETIAPINE FUMARATE 25 MG PO TABS
25.0000 mg | ORAL_TABLET | ORAL | Status: DC
  Administered 2018-02-17 – 2018-02-21 (×4): 25 mg via ORAL
  Filled 2018-02-17 (×4): qty 1

## 2018-02-17 NOTE — Progress Notes (Signed)
Speech Language Pathology Daily Session Note  Patient Details  Name: [name] MRN: [id number] Date of Birth: November 18, 1935  Today's Date: 02/17/2018 SLP Individual Time: 3825-0539 SLP Individual Time Calculation (min): 42 min  Short Term Goals: Week 1: SLP Short Term Goal 1 (Week 1): Patient will identify functional items from a field of 2 with 50% accuracy with Max A multimodal cues.  SLP Short Term Goal 2 (Week 1): Patient will answer basic yes/no questions in regards to biographical information with 50% accuracy and Max A multimodal cues.  SLP Short Term Goal 3 (Week 1): Patient will follow basic 1-step directions during functional tasks with Max A multimodal cues.  SLP Short Term Goal 4 (Week 1): Patient will complete basic and functional problem solving tasks with Max A multimodal cues.  SLP Short Term Goal 5 (Week 1): Patient will demonstrate sustained attention for 5-minute intervals during functional tasks with Max A verbal cues.  SLP Short Term Goal 6 (Week 1): Patient will self-monitor verbal errors x2 within a 45-minute session with Max A multimodal cues.   Skilled Therapeutic Interventions:   Pt was seen for skilled ST targeting communication goals.  Pt needed max assist to come to sitting at edge of bed and donn a warm jacket.  She was transferred to wheelchair to maximize attention, alertness, and participation for therapy session.  Pt fed herself a small amount of magic cup with mod-max assist and gestured via facial expressions when she was finished.  SLP facilitated the session with singing tasks to address familiar and automatic verbalizations.  Pt could intermittent mark rhythm and intonation of music but not the actual words.  Pt was returned to room and left in wheelchair with chair alarm set, quick release belt donned, and call bell within reach.  Continue per current plan of care.     Function:  Eating Eating   Modified Consistency Diet: No Eating Assist Level:  Helper scoops food on utensil;Help with picking up utensils           Cognition Comprehension Comprehension assist level: Understands basic less than 25% of the time/ requires cueing >75% of the time  Expression   Expression assist level: Expresses basis less than 25% of the time/requires cueing >75% of the time.  Social Interaction Social Interaction assist level: Interacts appropriately less than 25% of the time. May be withdrawn or combative.  Problem Solving Problem solving assist level: Solves basic less than 25% of the time - needs direction nearly all the time or does not effectively solve problems and may need a restraint for safety  Memory Memory assist level: Recognizes or recalls less than 25% of the time/requires cueing greater than 75% of the time    Pain Pain Assessment Pain Scale: 0-10 Pain Score: 0-No pain  Therapy/Group: Individual Therapy  Hamsini Verrilli, Selinda Orion 02/17/2018, 1:39 PM

## 2018-02-17 NOTE — Progress Notes (Signed)
Subjective/Complaints: Aphasic, eating better per daughter ROS- cannot obtain due to aphasia Objective: Vital Signs: Blood pressure (!) 159/96, pulse 72, temperature 98 F (36.7 C), temperature source Oral, resp. rate 18, height 5\' 3"  (1.6 m), weight 60 kg (132 lb 4.4 oz), SpO2 98 %. Dg Chest 1 View  Result Date: 02/15/2018 CLINICAL DATA:  Fever and confusion. The patient was unable to tolerate positioning for the lateral view. EXAM: CHEST  1 VIEW COMPARISON:  PA and lateral chest x-ray of February 09, 2018. FINDINGS: The lungs are less well inflated today. The interstitial markings are coarse. There is mild elevation of the left hemidiaphragm with gaseous distention of bowel or the stomach deep to it. The heart is normal in size. The pulmonary vascularity is not clearly engorged. There are post CABG changes. There is calcification in the wall of the aortic arch. IMPRESSION: Mild bilateral hypoinflation which accentuates the interstitial markings. I cannot exclude a pneumonitis type process or low-grade interstitial edema. No alveolar pneumonia. Electronically Signed   By: David  Martinique M.D.   On: 02/15/2018 12:35   Results for orders placed or performed during the hospital encounter of 02/13/18 (from the past 72 hour(s))  Glucose, capillary     Status: Abnormal   Collection Time: 02/14/18 11:29 AM  Result Value Ref Range   Glucose-Capillary 243 (H) 65 - 99 mg/dL  Glucose, capillary     Status: Abnormal   Collection Time: 02/14/18  4:38 PM  Result Value Ref Range   Glucose-Capillary 262 (H) 65 - 99 mg/dL  Glucose, capillary     Status: Abnormal   Collection Time: 02/14/18  8:51 PM  Result Value Ref Range   Glucose-Capillary 234 (H) 65 - 99 mg/dL  Glucose, capillary     Status: Abnormal   Collection Time: 02/15/18  6:47 AM  Result Value Ref Range   Glucose-Capillary 179 (H) 65 - 99 mg/dL  Urinalysis, Routine w reflex microscopic     Status: Abnormal   Collection Time: 02/15/18 10:17 AM   Result Value Ref Range   Color, Urine YELLOW YELLOW   APPearance CLEAR CLEAR   Specific Gravity, Urine 1.011 1.005 - 1.030   pH 5.0 5.0 - 8.0   Glucose, UA >=500 (A) NEGATIVE mg/dL   Hgb urine dipstick SMALL (A) NEGATIVE   Bilirubin Urine NEGATIVE NEGATIVE   Ketones, ur NEGATIVE NEGATIVE mg/dL   Protein, ur NEGATIVE NEGATIVE mg/dL   Nitrite NEGATIVE NEGATIVE   Leukocytes, UA NEGATIVE NEGATIVE   RBC / HPF 0-5 0 - 5 RBC/hpf   WBC, UA 0-5 0 - 5 WBC/hpf   Bacteria, UA NONE SEEN NONE SEEN   Squamous Epithelial / LPF 0-5 (A) NONE SEEN    Comment: Performed at [facility], [address] [place], [place] [address]  CBC with Differential/Platelet     Status: None   Collection Time: 02/15/18 10:36 AM  Result Value Ref Range   WBC 8.2 4.0 - 10.5 K/uL   RBC 4.38 3.87 - 5.11 MIL/uL   Hemoglobin 13.9 12.0 - 15.0 g/dL   HCT 41.2 36.0 - 46.0 %   MCV 94.1 78.0 - 100.0 fL   MCH 31.7 26.0 - 34.0 pg   MCHC 33.7 30.0 - 36.0 g/dL   RDW 13.0 11.5 - 15.5 %   Platelets 164 150 - 400 K/uL   Neutrophils Relative % 75 %   Neutro Abs 6.2 1.7 - 7.7 K/uL   Lymphocytes Relative 18 %   Lymphs Abs  1.5 0.7 - 4.0 K/uL   Monocytes Relative 6 %   Monocytes Absolute 0.5 0.1 - 1.0 K/uL   Eosinophils Relative 1 %   Eosinophils Absolute 0.1 0.0 - 0.7 K/uL   Basophils Relative 0 %   Basophils Absolute 0.0 0.0 - 0.1 K/uL    Comment: Performed at [facility] [address] [place], [place] [address]  Glucose, capillary     Status: Abnormal   Collection Time: 02/15/18 11:29 AM  Result Value Ref Range   Glucose-Capillary 404 (H) 65 - 99 mg/dL   Comment 1 Notify RN   Glucose, capillary     Status: Abnormal   Collection Time: 02/15/18 12:50 PM  Result Value Ref Range   Glucose-Capillary 337 (H) 65 - 99 mg/dL  Glucose, capillary     Status: Abnormal   Collection Time: 02/15/18  4:44 PM  Result Value Ref Range   Glucose-Capillary 253 (H) 65 - 99 mg/dL   Comment 1 Notify RN   Glucose, capillary      Status: Abnormal   Collection Time: 02/15/18 10:13 PM  Result Value Ref Range   Glucose-Capillary 185 (H) 65 - 99 mg/dL   Comment 1 Notify RN   Glucose, capillary     Status: Abnormal   Collection Time: 02/16/18  6:44 AM  Result Value Ref Range   Glucose-Capillary 174 (H) 65 - 99 mg/dL   Comment 1 Notify RN   Glucose, capillary     Status: Abnormal   Collection Time: 02/16/18 11:37 AM  Result Value Ref Range   Glucose-Capillary 287 (H) 65 - 99 mg/dL  Glucose, capillary     Status: Abnormal   Collection Time: 02/16/18  4:26 PM  Result Value Ref Range   Glucose-Capillary 328 (H) 65 - 99 mg/dL  Glucose, capillary     Status: Abnormal   Collection Time: 02/16/18  9:42 PM  Result Value Ref Range   Glucose-Capillary 224 (H) 65 - 99 mg/dL  Glucose, capillary     Status: Abnormal   Collection Time: 02/17/18  6:42 AM  Result Value Ref Range   Glucose-Capillary 255 (H) 65 - 99 mg/dL     HEENT: normal Cardio: RRR and no murmur Resp: CTA B/L and no wheezes GI: BS positive and NT, ND Extremity:  No Edema Skin:   Bruise forearms Neuro: Abnormal Sensory reduced sensation to pinch Right Upper and Lower limb, Abnormal Motor 3/5 RUE and RLE, Abnormal FMC Ataxic/ dec FMC and Aphasic Musc/Skel:  Other no pain with UE or LE ROM Gen NAD   Assessment/Plan: 1. Functional deficits secondary to Left thalamic and Left parietal infarct which require 3+ hours per day of interdisciplinary therapy in a comprehensive inpatient rehab setting. Physiatrist is providing close team supervision and 24 hour management of active medical problems listed below. Physiatrist and rehab team continue to assess barriers to discharge/monitor patient progress toward functional and medical goals. FIM: Function - Bathing Position: Shower Body parts bathed by patient: Right arm Body parts bathed by helper: Left arm, Chest, Abdomen, Front perineal area, Buttocks, Right upper leg, Left upper leg, Right lower leg, Left  lower leg, Back Bathing not applicable: Buttocks Assist Level: Touching or steadying assistance(Pt > 75%)  Function- Upper Body Dressing/Undressing What is the patient wearing?: Pull over shirt/dress Pull over shirt/dress - Perfomed by patient: Thread/unthread right sleeve, Thread/unthread left sleeve, Put head through opening, Pull shirt over trunk Pull over shirt/dress - Perfomed by helper: Thread/unthread right sleeve, Thread/unthread left  sleeve, Pull shirt over trunk, Put head through opening Assist Level: Touching or steadying assistance(Pt > 75%) Function - Lower Body Dressing/Undressing What is the patient wearing?: Non-skid slipper socks Position: Wheelchair/chair at sink Non-skid slipper socks- Performed by patient: Don/doff right sock Non-skid slipper socks- Performed by helper: Don/doff right sock, Don/doff left sock Assist for footwear: Dependant Assist for lower body dressing: Touching or steadying assistance (Pt > 75%)  Function - Toileting Toileting steps completed by patient: Performs perineal hygiene Toileting steps completed by helper: Adjust clothing after toileting, Adjust clothing prior to toileting, Performs perineal hygiene Toileting Assistive Devices: Grab bar or rail Assist level: Touching or steadying assistance (Pt.75%)  Function - Air cabin crew transfer assistive device: Grab bar Assist level to toilet: Maximal assist (Pt 25 - 49%/lift and lower) Assist level from toilet: Maximal assist (Pt 25 - 49%/lift and lower) Assist level to bedside commode (at bedside): 2 helpers Assist level from bedside commode (at bedside): 2 helpers  Function - Chair/bed transfer Chair/bed transfer method: Squat pivot Chair/bed transfer assist level: Maximal assist (Pt 25 - 49%/lift and lower) Chair/bed transfer assistive device: Armrests Chair/bed transfer details: Verbal cues for technique, Manual facilitation for placement, Manual facilitation for weight shifting,  Tactile cues for sequencing, Tactile cues for initiation, Visual cues/gestures for sequencing, Tactile cues for placement, Tactile cues for posture  Function - Locomotion: Wheelchair Will patient use wheelchair at discharge?: No Assist Level: Dependent (Pt equals 0%)(for transport) Function - Locomotion: Ambulation Assistive device: Hand held assist Max distance: 10' Assist level: Moderate assist (Pt 50 - 74%) Assist level: Moderate assist (Pt 50 - 74%) Walk 50 feet with 2 turns activity did not occur: Safety/medical concerns Walk 150 feet activity did not occur: Safety/medical concerns Walk 10 feet on uneven surfaces activity did not occur: Safety/medical concerns  Function - Comprehension Comprehension: Auditory Comprehension assist level: Understands basic less than 25% of the time/ requires cueing >75% of the time  Function - Expression Expression: Verbal Expression assist level: Expresses basis less than 25% of the time/requires cueing >75% of the time.  Function - Social Interaction Social Interaction assist level: Interacts appropriately less than 25% of the time. May be withdrawn or combative.  Function - Problem Solving Problem solving assist level: Solves basic less than 25% of the time - needs direction nearly all the time or does not effectively solve problems and may need a restraint for safety  Function - Memory Memory assist level: Recognizes or recalls less than 25% of the time/requires cueing greater than 75% of the time Patient normally able to recall (first 3 days only): None of the above  Medical Problem List and Plan:  1. Functional deficits secondary to infarcts in left thalamus and left parietal periventricular white matter  -admit to inpatient rehab  PT, OT, SLP CIR level 2. DVT Prophylaxis/Anticoagulation: Pharmaceutical: Lovenox and Other (comment)  3. Pain Management: tylenol prn  4. Mood: Patient currently with poor awareness of deficits. LCSW to  follow for evaluation when appropriate.  -pt has demonstrating increased confusion and agitation at night, poor sleep patterns  -check sleep chart  -begin scheduled, low dose seroquel  5. Neuropsych: This patient is not capable of making decisions on her own behalf.  6. Skin/Wound Care: routine pressure relief measures  7. Fluids/Electrolytes/Nutrition: Monitor I/O.357ml fluid on 3/28 Offer supplements if intake poor.  8. Chronic combined systolic/diastolic CHF/chemo induced CM: Heart healthy diet. Monitor for signs of overload and check weights daily. BB discontinued due to bradycardia.  Continue Lisinopril daily. Used lasix prn edema at home.  Vitals:   02/16/18 1544 02/17/18 0338  BP: (!) 151/61 (!) 159/96  Pulse: 68 72  Resp: 18 18  Temp: 98.2 F (36.8 C) 98 F (36.7 C)  SpO2: 98% 98%   9. CAD w/p PTCA: On ASA and Zetia.  10. CKD: Avoid nephrotoxic medications. Monitor with serial checks.  11.T2DM: Hgb A1C- 8.6. Blood sugars poorly controlled.--Used Lantus 80 units with metformin at nights. Increase Lantus to 15 units daily and titrate as needed. Monitor BS ac/hs. Uncontrolled 3/30 CBG (last 3)  Recent Labs    02/16/18 1626 02/16/18 2142 02/17/18 0642  GLUCAP 328* 224* 255*  12. Hypothyroid: On supplement.     LOS (Days) 4 A FACE TO FACE EVALUATION WAS PERFORMED  Charlett Blake 02/17/2018, 9:12 AM

## 2018-02-17 NOTE — Progress Notes (Signed)
Physical Therapy Session Note  Patient Details  Name: [name] MRN: [id number] Date of Birth: [birth year]  Today's Date: 02/17/2018 PT Individual Time: 1100-1145 AND 1430-1515 (15 min make up from morning) PT Individual Time Calculation (min): 45 min  AND 45 min  Short Term Goals: Week 1:  PT Short Term Goal 1 (Week 1): Pt will transfer bed<>chair w/ min assist PT Short Term Goal 2 (Week 1): Pt will ambulate 50' w/ LRAD w/ min assist PT Short Term Goal 3 (Week 1): Pt will follow simple, 1-step commands during functional mobility 100% of the time PT Short Term Goal 4 (Week 1): Pt will maintain dynamic sitting balance w/ supervision  Skilled Therapeutic Interventions/Progress Updates:   Session 1:  Pt in w/c and agreeable to therapy, daughter present, no c/o pain. Total assist w/c transport to/from gym for time management. Worked on functional mobility in gym w/ gait. Assessed use of RW for gait, ambulated 36' w/ RW and mod assist for upright balance (R lateral lean), lateral weight shifting, and maintaining RUE on walker. 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 Stopped use of RW and ambulated w/ HHA x2 w/ better success, 40'. Max verbal and manual cues to facilitate safe turning to sit in w/c. Pt continued to speak nonsensical jargon throughout session, however improved 1-step command following. Pt beginning to fall asleep during rest break and not easily arousable. Returned to room in w/c and transferred to EOB, max assist via stand pivot, and transferred to supine. Pt quickly fell asleep. Ended session in supine and in care of daughter, all needs met. Bed alarm on.   Discussion w/ daughter at end of session who expresses concern about pt's CLOF, progression, and goals of d/c to home. Educated her on slow progression of stroke impairments and reaffirmed that she is correct in thinking ahead of time about d/c plan and  making arrangements accordingly at home. Encouraged her to continue observing and participating in therapies, she has been present for many sessions with this therapist, and to continue engaging w/ pt to work on orientation and cognition. Daughter verbalized understanding and appreciative of education.   Session 2: Pt in w/c and agreeable to therapy, no evidence of pain, multiple family members present for session. Total assist w/c transport to/from BI gym, performed dynavision this session to work on attention to task, command following, and to assess extent of R inattention impairments. Pt able to attend for 2-3 taps before losing attention and required max verbal, visual, and manual cues to continue. Averaged 5 sec response time on L visual field, 15 sec response time on R visual field. Educated family members on use of dynavision to work on listed impairments, but limits in carryover to functional tasks. Educated on using automatic cues such as "stand-up" or "take a drink" as pt responds more consistently to functional and familiar commands for successful motor planning vs abstract commands. Family members expressed similar concerns that daughter expressed in morning session, reiterated CLOF, LTGs, and d/c recommendations. Answered questions regarding cause of stroke, stroke impairments, and stroke prevention in relation to pt's specific stroke impairments. All appreciative of education and verbalized understanding. Returned to room and transferred to bedside commode as pt appearing to signal she needed to toilet. Unsuccessful in voiding, total assist for brief management and transferred to EOB and supine w/ mod assist. Ended session in supine and in care of family, all  needs met.   Therapy Documentation Precautions:  Precautions Precautions: Fall Restrictions Weight Bearing Restrictions: No General: PT Amount of Missed Time (min): 15 Minutes PT Missed Treatment Reason: Patient fatigue  See  Function Navigator for Current Functional Status.   Therapy/Group: Individual Therapy  Roshelle Traub K Arnette 02/17/2018, 12:31 PM

## 2018-02-17 NOTE — Progress Notes (Signed)
Occupational Therapy Session Note  Patient Details  Name: [name] MRN: [id number] Date of Birth: [birth year]  Today's Date: 02/17/2018 OT Individual Time: 0800-0900 OT Individual Time Calculation (min): 60 min    Short Term Goals: Week 1:  OT Short Term Goal 1 (Week 1): Pt will safely transfer from w/c to toilet with min A  OT Short Term Goal 2 (Week 1): Pt will complete oral hygiene with min multimodal cues OT Short Term Goal 3 (Week 1): Pt will complete seated level LB dressing with stand by assist  Skilled Therapeutic Interventions/Progress Updates:    Treatment session focused on ADLs/self care training, transfer training, pt/family education, NMR, and activity tolerance. Upon entering, pt supine in bed sleeping, required additional t/c and v/c to arouse. Moved to EOB sitting with continual cues to reorient throughout session. Pt transferred into w/c with max A x 2, noted to be incontinent. Pt transferred from w/c<>shower chair using grab bars with max A and additional cues for safe transfer. Pt R ataxia present throughout shower and required max A hand over hand for NMR for washing UB and LB in sitting. Pt completed UB/LB dressing with continual prompting for initiating and completing with max A at sit<>stand level at sink side. Pt completed grooming at sink with continual prompting throughout for thoroughness. Daughter present during therapy and therapist provided suggestion to help orient patient. Pt left resting in w/c with needs met and daughter present and set up for breakfast.    Therapy Documentation Precautions:  Precautions Precautions: Fall Restrictions Weight Bearing Restrictions: No  See Function Navigator for Current Functional Status.   Therapy/Group: Individual Therapy  Delon Sacramento 02/17/2018, 12:03 PM

## 2018-02-18 ENCOUNTER — Inpatient Hospital Stay (HOSPITAL_COMMUNITY): Payer: Medicare Other

## 2018-02-18 LAB — GLUCOSE, CAPILLARY
GLUCOSE-CAPILLARY: 331 mg/dL — AB (ref 65–99)
Glucose-Capillary: 258 mg/dL — ABNORMAL HIGH (ref 65–99)
Glucose-Capillary: 368 mg/dL — ABNORMAL HIGH (ref 65–99)
Glucose-Capillary: 457 mg/dL — ABNORMAL HIGH (ref 65–99)
Glucose-Capillary: 481 mg/dL — ABNORMAL HIGH (ref 65–99)

## 2018-02-18 MED ORDER — INSULIN GLARGINE 100 UNIT/ML ~~LOC~~ SOLN
20.0000 [IU] | Freq: Every day | SUBCUTANEOUS | Status: DC
  Administered 2018-02-18: 20 [IU] via SUBCUTANEOUS
  Filled 2018-02-18: qty 0.2

## 2018-02-18 MED ORDER — INSULIN ASPART 100 UNIT/ML ~~LOC~~ SOLN
11.0000 [IU] | Freq: Once | SUBCUTANEOUS | Status: AC
  Administered 2018-02-18: 11 [IU] via SUBCUTANEOUS

## 2018-02-18 NOTE — Progress Notes (Signed)
Physical Therapy Session Note  Patient Details  Name: [name] MRN: [id number] Date of Birth: 1935/01/21  Today's Date: 02/18/2018 PT Individual Time: 1749-4496 PT Individual Time Calculation (min): 45 min   Short Term Goals: Week 1:  PT Short Term Goal 1 (Week 1): Pt will transfer bed<>chair w/ min assist PT Short Term Goal 2 (Week 1): Pt will ambulate 50' w/ LRAD w/ min assist PT Short Term Goal 3 (Week 1): Pt will follow simple, 1-step commands during functional mobility 100% of the time PT Short Term Goal 4 (Week 1): Pt will maintain dynamic sitting balance w/ supervision  Skilled Therapeutic Interventions/Progress Updates:    Pt supine in bed upon PT arrival, no evidence of pain. Pt asleep and difficult to arouse, requiring max assist to transfer to EOB and mod-max assist for sitting balance. Pt attempting to lay back down but therapist and family able to engage her in eating breakfast. Pt worked on seated balance, increased posterior lean with verbal and tactile cues to correct. Pt would take bites of food with manual placement of fork in patients hand, pt would not reach for fork or food on her own. Requiring hand over hand assist to reach for cups for a drink. RN providing medications as well while therapist working on sitting tolerance/balance. Pt performed sit<>stand with mod assist and stand pivot mod assist to bedside commode. Pt incontinent of bladder, therapist cleaned peri area and changed brief total assist. Pt transferred from commode>w/c stand pivot mod assist. Pt left seated in w/c with QRB in place, chair alarm set and family present.   Therapy Documentation Precautions:  Precautions Precautions: Fall Restrictions Weight Bearing Restrictions: No   See Function Navigator for Current Functional Status.   Therapy/Group: Individual Therapy  Netta Corrigan, PT, DPT 02/18/2018, 7:53 AM

## 2018-02-18 NOTE — Progress Notes (Signed)
Pt.'s Family had some concerns with pt.'s nutritional intake. Family feels pt may benefit from a possible PRN snack in between meals, however pt CBG's range on the high end. Staff working with family to increase liquid intake for the remainder of the shift.

## 2018-02-18 NOTE — Significant Event (Signed)
CBG - 458. MD notified. New orders received. Hyper 2/2 milkshake + glucerna and meal afternoon.

## 2018-02-18 NOTE — Progress Notes (Signed)
Subjective/Complaints: Discussed prior status wit hson who states that dementia was worsening at home ( talking about seeing her deceased parents )    ROS- cannot obtain due to aphasia Objective: Vital Signs: Blood pressure (!) 148/92, pulse 75, temperature 98.1 F (36.7 C), temperature source Oral, resp. rate 18, height 5\' 3"  (1.6 m), weight 60 kg (132 lb 4.4 oz), SpO2 100 %. No results found. Results for orders placed or performed during the hospital encounter of 02/13/18 (from the past 72 hour(s))  Urinalysis, Routine w reflex microscopic     Status: Abnormal   Collection Time: 02/15/18 10:17 AM  Result Value Ref Range   Color, Urine YELLOW YELLOW   APPearance CLEAR CLEAR   Specific Gravity, Urine 1.011 1.005 - 1.030   pH 5.0 5.0 - 8.0   Glucose, UA >=500 (A) NEGATIVE mg/dL   Hgb urine dipstick SMALL (A) NEGATIVE   Bilirubin Urine NEGATIVE NEGATIVE   Ketones, ur NEGATIVE NEGATIVE mg/dL   Protein, ur NEGATIVE NEGATIVE mg/dL   Nitrite NEGATIVE NEGATIVE   Leukocytes, UA NEGATIVE NEGATIVE   RBC / HPF 0-5 0 - 5 RBC/hpf   WBC, UA 0-5 0 - 5 WBC/hpf   Bacteria, UA NONE SEEN NONE SEEN   Squamous Epithelial / LPF 0-5 (A) NONE SEEN    Comment: Performed at [facility], [address] [place], [place] [address]  CBC with Differential/Platelet     Status: None   Collection Time: 02/15/18 10:36 AM  Result Value Ref Range   WBC 8.2 4.0 - 10.5 K/uL   RBC 4.38 3.87 - 5.11 MIL/uL   Hemoglobin 13.9 12.0 - 15.0 g/dL   HCT 41.2 36.0 - 46.0 %   MCV 94.1 78.0 - 100.0 fL   MCH 31.7 26.0 - 34.0 pg   MCHC 33.7 30.0 - 36.0 g/dL   RDW 13.0 11.5 - 15.5 %   Platelets 164 150 - 400 K/uL   Neutrophils Relative % 75 %   Neutro Abs 6.2 1.7 - 7.7 K/uL   Lymphocytes Relative 18 %   Lymphs Abs 1.5 0.7 - 4.0 K/uL   Monocytes Relative 6 %   Monocytes Absolute 0.5 0.1 - 1.0 K/uL   Eosinophils Relative 1 %   Eosinophils Absolute 0.1 0.0 - 0.7 K/uL   Basophils Relative 0 %   Basophils Absolute 0.0  0.0 - 0.1 K/uL    Comment: Performed at [facility] [address], [place], [place] [address]  Glucose, capillary     Status: Abnormal   Collection Time: 02/15/18 11:29 AM  Result Value Ref Range   Glucose-Capillary 404 (H) 65 - 99 mg/dL   Comment 1 Notify RN   Glucose, capillary     Status: Abnormal   Collection Time: 02/15/18 12:50 PM  Result Value Ref Range   Glucose-Capillary 337 (H) 65 - 99 mg/dL  Glucose, capillary     Status: Abnormal   Collection Time: 02/15/18  4:44 PM  Result Value Ref Range   Glucose-Capillary 253 (H) 65 - 99 mg/dL   Comment 1 Notify RN   Glucose, capillary     Status: Abnormal   Collection Time: 02/15/18 10:13 PM  Result Value Ref Range   Glucose-Capillary 185 (H) 65 - 99 mg/dL   Comment 1 Notify RN   Glucose, capillary     Status: Abnormal   Collection Time: 02/16/18  6:44 AM  Result Value Ref Range   Glucose-Capillary 174 (H) 65 - 99 mg/dL  Comment 1 Notify RN   Glucose, capillary     Status: Abnormal   Collection Time: 02/16/18 11:37 AM  Result Value Ref Range   Glucose-Capillary 287 (H) 65 - 99 mg/dL  Culture, Urine     Status: Abnormal   Collection Time: 02/16/18  3:03 PM  Result Value Ref Range   Specimen Description URINE, CLEAN CATCH    Special Requests      NONE Performed at [facility], [place] [address], [place], [place] [address]    Culture MULTIPLE SPECIES PRESENT, SUGGEST RECOLLECTION (A)    Report Status 02/17/2018 FINAL   Glucose, capillary     Status: Abnormal   Collection Time: 02/16/18  4:26 PM  Result Value Ref Range   Glucose-Capillary 328 (H) 65 - 99 mg/dL  Glucose, capillary     Status: Abnormal   Collection Time: 02/16/18  9:42 PM  Result Value Ref Range   Glucose-Capillary 224 (H) 65 - 99 mg/dL  Glucose, capillary     Status: Abnormal   Collection Time: 02/17/18  6:42 AM  Result Value Ref Range   Glucose-Capillary 255 (H) 65 - 99 mg/dL  Glucose, capillary     Status: Abnormal   Collection Time:  02/17/18 11:46 AM  Result Value Ref Range   Glucose-Capillary 285 (H) 65 - 99 mg/dL  Glucose, capillary     Status: Abnormal   Collection Time: 02/17/18  4:38 PM  Result Value Ref Range   Glucose-Capillary 351 (H) 65 - 99 mg/dL  Glucose, capillary     Status: Abnormal   Collection Time: 02/17/18  9:30 PM  Result Value Ref Range   Glucose-Capillary 326 (H) 65 - 99 mg/dL  Glucose, capillary     Status: Abnormal   Collection Time: 02/18/18  6:29 AM  Result Value Ref Range   Glucose-Capillary 258 (H) 65 - 99 mg/dL     HEENT: normal Cardio: RRR and no murmur Resp: CTA B/L and no wheezes GI: BS positive and NT, ND Extremity:  No Edema Skin:   Bruise forearms Neuro: Abnormal Sensory reduced sensation to pinch Right Upper and Lower limb, Abnormal Motor 3/5 RUE and RLE, Abnormal FMC Ataxic/ dec FMC and Aphasic Musc/Skel:  Other no pain with UE or LE ROM Gen NAD   Assessment/Plan: 1. Functional deficits secondary to Left thalamic and Left parietal infarct which require 3+ hours per day of interdisciplinary therapy in a comprehensive inpatient rehab setting. Physiatrist is providing close team supervision and 24 hour management of active medical problems listed below. Physiatrist and rehab team continue to assess barriers to discharge/monitor patient progress toward functional and medical goals. FIM: Function - Bathing Position: Shower Body parts bathed by patient: Right arm, Chest, Left upper leg Body parts bathed by helper: Left arm, Abdomen, Front perineal area, Buttocks, Right upper leg, Right lower leg, Left lower leg, Back Bathing not applicable: Buttocks Assist Level: Touching or steadying assistance(Pt > 75%)  Function- Upper Body Dressing/Undressing What is the patient wearing?: Pull over shirt/dress Pull over shirt/dress - Perfomed by patient: Thread/unthread right sleeve, Thread/unthread left sleeve, Put head through opening, Pull shirt over trunk Pull over shirt/dress -  Perfomed by helper: Thread/unthread right sleeve, Thread/unthread left sleeve, Pull shirt over trunk, Put head through opening Assist Level: Touching or steadying assistance(Pt > 75%) Function - Lower Body Dressing/Undressing What is the patient wearing?: Non-skid slipper socks Position: Wheelchair/chair at sink Non-skid slipper socks- Performed by patient: Don/doff right sock Non-skid slipper socks- Performed by  helper: Don/doff right sock, Don/doff left sock Assist for footwear: Dependant Assist for lower body dressing: Touching or steadying assistance (Pt > 75%)  Function - Toileting Toileting steps completed by patient: Performs perineal hygiene Toileting steps completed by helper: Adjust clothing after toileting, Adjust clothing prior to toileting, Performs perineal hygiene Toileting Assistive Devices: Grab bar or rail Assist level: Touching or steadying assistance (Pt.75%)  Function - Toilet Transfers Toilet transfer assistive device: Grab bar Assist level to toilet: Maximal assist (Pt 25 - 49%/lift and lower) Assist level from toilet: Maximal assist (Pt 25 - 49%/lift and lower) Assist level to bedside commode (at bedside): 2 helpers Assist level from bedside commode (at bedside): 2 helpers  Function - Chair/bed transfer Chair/bed transfer method: Squat pivot Chair/bed transfer assist level: Moderate assist (Pt 50 - 74%/lift or lower) Chair/bed transfer assistive device: Armrests, Bedrails Chair/bed transfer details: Verbal cues for technique, Manual facilitation for placement, Manual facilitation for weight shifting, Tactile cues for sequencing, Tactile cues for initiation, Tactile cues for placement  Function - Locomotion: Wheelchair Will patient use wheelchair at discharge?: No Assist Level: Dependent (Pt equals 0%)(for transport) Function - Locomotion: Ambulation Assistive device: Walker-rolling Max distance: 64' Assist level: Moderate assist (Pt 50 - 74%) Assist level:  Moderate assist (Pt 50 - 74%) Walk 50 feet with 2 turns activity did not occur: Safety/medical concerns Assist level: Moderate assist (Pt 50 - 74%) Walk 150 feet activity did not occur: Safety/medical concerns Walk 10 feet on uneven surfaces activity did not occur: Safety/medical concerns  Function - Comprehension Comprehension: Auditory Comprehension assist level: Understands basic less than 25% of the time/ requires cueing >75% of the time  Function - Expression Expression: Verbal Expression assist level: Expresses basis less than 25% of the time/requires cueing >75% of the time.  Function - Social Interaction Social Interaction assist level: Interacts appropriately less than 25% of the time. May be withdrawn or combative.  Function - Problem Solving Problem solving assist level: Solves basic less than 25% of the time - needs direction nearly all the time or does not effectively solve problems and may need a restraint for safety  Function - Memory Memory assist level: Recognizes or recalls less than 25% of the time/requires cueing greater than 75% of the time Patient normally able to recall (first 3 days only): None of the above  Medical Problem List and Plan:  1. Functional deficits secondary to infarcts in left thalamus and left parietal periventricular white matter  -admit to inpatient rehab  PT, OT, SLP CIR level 2. DVT Prophylaxis/Anticoagulation: Pharmaceutical: Lovenox and Other (comment)  3. Pain Management: tylenol prn  4. Mood: Patient currently with poor awareness of deficits. LCSW to follow for evaluation when appropriate.  -pt has demonstrating increased confusion and agitation at night, poor sleep patterns  -check sleep chart  -begin scheduled, low dose seroquel  5. Neuropsych: This patient is not capable of making decisions on her own behalf.  6. Skin/Wound Care: routine pressure relief measures  7. Fluids/Electrolytes/Nutrition: Monitor I/O.452ml fluid on 3/28  Offer supplements if intake poor.  8. Chronic combined systolic/diastolic CHF/chemo induced CM: Heart healthy diet. Monitor for signs of overload and check weights daily. BB discontinued due to bradycardia. Continue Lisinopril daily. Used lasix prn edema at home.  Vitals:   02/17/18 1522 02/18/18 0423  BP: 121/61 (!) 148/92  Pulse: 75 75  Resp: 18 18  Temp: 97.9 F (36.6 C) 98.1 F (36.7 C)  SpO2: 92% 100%   9. CAD w/p  PTCA: On ASA and Zetia.  10. CKD: Avoid nephrotoxic medications. Monitor with serial checks.  11.T2DM: Hgb A1C- 8.6. Blood sugars poorly controlled.--Used Lantus 80 units with metformin at nights. Increase Lantus to 15 units daily and titrate as needed. Monitor BS ac/hs. Uncontrolled 3/31, increase lantus to 20U CBG (last 3)  Recent Labs    02/17/18 1638 02/17/18 2130 02/18/18 0629  GLUCAP 351* 326* 258*  12. Hypothyroid: On supplement.  13.  UA neg , Cx multiple species, will d/c Keflex   LOS (Days) 5 A FACE TO FACE EVALUATION WAS PERFORMED  Charlett Blake 02/18/2018, 8:58 AM

## 2018-02-18 NOTE — Plan of Care (Signed)
  Problem: RH KNOWLEDGE DEFICIT Goal: RH STG INCREASE KNOWLEDGE OF DIABETES Description Family will be able to verbalize understanding of diabetes management, CBG monitoring and medications indepently with cues/reminders/resources provided  Outcome: Not Progressing

## 2018-02-19 ENCOUNTER — Inpatient Hospital Stay (HOSPITAL_COMMUNITY): Payer: Medicare Other

## 2018-02-19 ENCOUNTER — Inpatient Hospital Stay (HOSPITAL_COMMUNITY): Payer: Medicare Other | Admitting: Speech Pathology

## 2018-02-19 ENCOUNTER — Inpatient Hospital Stay (HOSPITAL_COMMUNITY): Payer: Medicare Other | Admitting: Occupational Therapy

## 2018-02-19 DIAGNOSIS — R569 Unspecified convulsions: Secondary | ICD-10-CM

## 2018-02-19 LAB — BASIC METABOLIC PANEL
Anion gap: 9 (ref 5–15)
Anion gap: 9 (ref 5–15)
BUN: 24 mg/dL — ABNORMAL HIGH (ref 6–20)
BUN: 25 mg/dL — AB (ref 6–20)
CALCIUM: 9 mg/dL (ref 8.9–10.3)
CHLORIDE: 101 mmol/L (ref 101–111)
CO2: 24 mmol/L (ref 22–32)
CO2: 23 mmol/L (ref 22–32)
Calcium: 9.5 mg/dL (ref 8.9–10.3)
Chloride: 102 mmol/L (ref 101–111)
Creatinine, Ser: 1.37 mg/dL — ABNORMAL HIGH (ref 0.44–1.00)
Creatinine, Ser: 1.27 mg/dL — ABNORMAL HIGH (ref 0.44–1.00)
GFR calc Af Amer: 45 mL/min — ABNORMAL LOW (ref >60)
GFR calc non Af Amer: 35 mL/min — ABNORMAL LOW (ref >60)
GFR, EST AFRICAN AMERICAN: 41 mL/min — AB (ref >60)
GFR, EST NON AFRICAN AMERICAN: 39 mL/min — AB (ref >60)
GLUCOSE: 210 mg/dL — AB (ref 65–99)
Glucose, Bld: 220 mg/dL — ABNORMAL HIGH (ref 65–99)
POTASSIUM: 5.7 mmol/L — AB (ref 3.5–5.1)
POTASSIUM: 5.2 mmol/L — AB (ref 3.5–5.1)
SODIUM: 134 mmol/L — AB (ref 135–145)
SODIUM: 134 mmol/L — AB (ref 135–145)

## 2018-02-19 LAB — CBC
HEMATOCRIT: 44.4 % (ref 36.0–46.0)
Hemoglobin: 15.3 g/dL — ABNORMAL HIGH (ref 12.0–15.0)
MCH: 32.8 pg (ref 26.0–34.0)
MCHC: 34.5 g/dL (ref 30.0–36.0)
MCV: 95.1 fL (ref 78.0–100.0)
PLATELETS: 208 10*3/uL (ref 150–400)
RBC: 4.67 MIL/uL (ref 3.87–5.11)
RDW: 13.1 % (ref 11.5–15.5)
WBC: 9.3 10*3/uL (ref 4.0–10.5)

## 2018-02-19 LAB — GLUCOSE, CAPILLARY
GLUCOSE-CAPILLARY: 244 mg/dL — AB (ref 65–99)
GLUCOSE-CAPILLARY: 288 mg/dL — AB (ref 65–99)
GLUCOSE-CAPILLARY: 282 mg/dL — AB (ref 65–99)
GLUCOSE-CAPILLARY: 214 mg/dL — AB (ref 65–99)
GLUCOSE-CAPILLARY: 165 mg/dL — AB (ref 65–99)
Glucose-Capillary: 217 mg/dL — ABNORMAL HIGH (ref 65–99)

## 2018-02-19 LAB — MAGNESIUM: MAGNESIUM: 2.7 mg/dL — AB (ref 1.7–2.4)

## 2018-02-19 MED ORDER — SODIUM POLYSTYRENE SULFONATE PO POWD
15.0000 g | Freq: Once | ORAL | Status: DC
  Filled 2018-02-19: qty 15

## 2018-02-19 MED ORDER — LEVETIRACETAM IN NACL 1000 MG/100ML IV SOLN: 1000.0000 mg | Freq: Two times a day (BID) | INTRAVENOUS | Status: DC

## 2018-02-19 MED ORDER — POLYVINYL ALCOHOL 1.4 % OP SOLN: 1.0000 [drp] | OPHTHALMIC | 0 refills | Status: AC | PRN

## 2018-02-19 MED ORDER — MUSCLE RUB 10-15 % EX CREA: 1.0000 "application " | TOPICAL_CREAM | CUTANEOUS | 0 refills | Status: AC | PRN

## 2018-02-19 MED ORDER — LEVETIRACETAM IN NACL 1000 MG/100ML IV SOLN: 1000.0000 mg | INTRAVENOUS | Status: DC

## 2018-02-19 MED ORDER — INSULIN GLARGINE 100 UNIT/ML ~~LOC~~ SOLN
25.0000 [IU] | Freq: Every day | SUBCUTANEOUS | Status: DC
  Administered 2018-02-20: 25 [IU] via SUBCUTANEOUS
  Filled 2018-02-19 (×2): qty 0.25

## 2018-02-19 MED ORDER — SODIUM CHLORIDE 0.9% FLUSH
10.0000 mL | INTRAVENOUS | Status: DC | PRN
  Administered 2018-02-20: 20 mL
  Filled 2018-02-19: qty 40

## 2018-02-19 MED ORDER — SODIUM POLYSTYRENE SULFONATE 15 GM/60ML PO SUSP: 15.0000 g | Freq: Once | ORAL | Status: DC

## 2018-02-19 MED ORDER — ACETAMINOPHEN 325 MG PO TABS: 650.0000 mg | ORAL_TABLET | Freq: Four times a day (QID) | ORAL | Status: AC | PRN

## 2018-02-19 MED ORDER — BLISTEX MEDICATED EX OINT: 1.0000 "application " | TOPICAL_OINTMENT | CUTANEOUS | Status: AC | PRN

## 2018-02-19 MED ORDER — SODIUM POLYSTYRENE SULFONATE PO POWD
15.0000 g | Freq: Once | ORAL | Status: DC
  Filled 2018-02-19 (×2): qty 15

## 2018-02-19 MED ORDER — ACETAMINOPHEN 325 MG PO TABS
650.0000 mg | ORAL_TABLET | ORAL | Status: DC | PRN
  Administered 2018-02-20 – 2018-02-25 (×6): 650 mg via ORAL
  Filled 2018-02-19 (×8): qty 2

## 2018-02-19 MED ORDER — LEVETIRACETAM IN NACL 1000 MG/100ML IV SOLN
1000.0000 mg | INTRAVENOUS | Status: AC
  Administered 2018-02-19: 1000 mg via INTRAVENOUS
  Filled 2018-02-19: qty 100

## 2018-02-19 MED ORDER — ACETAMINOPHEN 650 MG RE SUPP
650.0000 mg | RECTAL | Status: DC | PRN
  Filled 2018-02-19: qty 1

## 2018-02-19 MED ORDER — SODIUM CHLORIDE 0.9 % IV SOLN
250.0000 mg | Freq: Two times a day (BID) | INTRAVENOUS | Status: DC
  Administered 2018-02-19 – 2018-02-20 (×2): 250 mg via INTRAVENOUS
  Filled 2018-02-19 (×3): qty 2.5

## 2018-02-19 MED ORDER — SODIUM CHLORIDE 0.9 % IV SOLN
INTRAVENOUS | Status: DC
  Administered 2018-02-19: 10:00:00 via INTRAVENOUS

## 2018-02-19 MED ORDER — SODIUM CHLORIDE 0.9 % IV SOLN
INTRAVENOUS | Status: DC
  Administered 2018-02-19 – 2018-02-20 (×2): via INTRAVENOUS

## 2018-02-19 NOTE — Progress Notes (Addendum)
STROKE TEAM PROGRESS NOTE   SUBJECTIVE (INTERVAL HISTORY) Her multiple family members are at the bedside.  I was asked by Dr. Dianna Limbo to see the patient as she had 2 episodes of possible focal seizures witnessed this morning. The family witnessed the first one in which there was some transient jerking of the neck and right upper extremity lasting only a few minutes. The second one was witnessed shortly thereafter by the nurse. Is no generalized tonic-clonic activity tongue bite or injury noted. Patient has had a follow-up CT scan of the head today which I have reviewed shows no acute abnormality and shows expected evolutionary changes in her left brain infarct. Prior to these episodes she had no known history of seizures. She continues to have global aphasia and communication seems quite limited. She appears sleepy and postictal after these episodes.    OBJECTIVE Temp:  [97.4 F (36.3 C)-99.9 F (37.7 C)] 97.4 F (36.3 C) (04/01 1347) Pulse Rate:  [63-80] 73 (04/01 1347) Resp:  [17-18] 17 (04/01 1347) BP: (106-138)/(68-95) 111/95 (04/01 1347) SpO2:  [98 %-100 %] 100 % (04/01 1347)  CBC:  Recent Labs  Lab 02/14/18 0711 02/15/18 1036 02/19/18 0601  WBC 7.1 8.2 9.3  NEUTROABS 3.9 6.2  --   HGB 14.9 13.9 15.3*  HCT 41.3 41.2 44.4  MCV 92.4 94.1 95.1  PLT 163 164 465    Basic Metabolic Panel:  Recent Labs  Lab 02/19/18 0601 02/19/18 1716  NA 134* 134*  K 5.7* 5.2*  CL 101 102  CO2 24 23  GLUCOSE 220* 210*  BUN 24* 25*  CREATININE 1.37* 1.27*  CALCIUM 9.5 9.0  MG 2.7*  --     Lipid Panel:     Component Value Date/Time   CHOL 193 02/10/2018 0428   TRIG 125 02/10/2018 0428   HDL 37 (L) 02/10/2018 0428   CHOLHDL 5.2 02/10/2018 0428   VLDL 25 02/10/2018 0428   LDLCALC 131 (H) 02/10/2018 0428   HgbA1c:  Lab Results  Component Value Date   HGBA1C 8.6 (H) 02/10/2018   Urine Drug Screen:     Component Value Date/Time   LABOPIA NONE DETECTED 02/09/2018 1433   COCAINSCRNUR NONE DETECTED 02/09/2018 1433   LABBENZ NONE DETECTED 02/09/2018 1433   AMPHETMU NONE DETECTED 02/09/2018 1433   THCU NONE DETECTED 02/09/2018 1433   LABBARB NONE DETECTED 02/09/2018 1433    Alcohol Level     Component Value Date/Time   ETH <10 02/09/2018 1306    IMAGING  Ct Head Wo Contrast 02/09/2018 IMPRESSION:  No acute abnormality. Atrophy, chronic microvascular ischemic change remote bilateral frontal infarcts. Atherosclerosis. Chronic left sphenoid sinus disease.   Ct Angio Head W Or Wo Contrast Ct Angio Neck W Or Wo Contrast 02/10/2018 IMPRESSION:  1. Negative CTA for large vessel occlusion.  2. Atheromatous stenoses of approximately 60% about the carotid bifurcations/proximal ICAs bilaterally.  3. Attenuation of the distal left MCA branches, most likely related to chronic left MCA territory infarct.  4. Moderate to severe left V1 and V2 stenoses as above.  5. Advanced intracranial atherosclerotic disease throughout the remaining intracranial circulation, most notable within the carotid siphons, right worse than left.   Dg Chest 2 View 02/09/2018 IMPRESSION:  No active cardiopulmonary disease.   LE venous doppler no DVT  EEG today 02/19/18 shows moderate diffuse slowing at times triphasic appearance. Occasional focal slowing over the left temporal region.  The previous EEG on 02/10/17 was also abnormal with   intermittent frontal  slowing (FIRDA).   FIRDA is a non-specific finding that can be seen with toxic, metabolic, diffuse, or multifocal structural processes.  No definite epileptiform changes were noted.   A single EEG without epileptiform changes does not exclude the diagnosis of epilepsy. Clinical correlation advised.  Transthoracic Echocardiogram - Left ventricle: Septal and apical akinesis inferior wall hypokinesis. The cavity size was severely dilated. Wall thickness was normal. The estimated ejection fraction was 25%. Doppler parameters are consistent  with both elevated ventricular end-diastolic filling pressure and elevated left atrial filling pressure. - Aortic valve: Given morphology and decreased EF AS likely more in moderae range despite low gradient. There was mild stenosis. There was mild regurgitation. - Mitral valve: Calcified annulus. Mildly thickened leaflets . - Atrial septum: No defect or patent foramen ovale was identified.   PHYSICAL EXAM General - Well nourished, well developed, in no apparent distress.  Cardiovascular - Regular rate and rhythm.  Neuro - lethargic post ictal likely. Awakens to stimulation. follows simple commands inconsistently. Still with word salad and expressive aphasia but able to say "take that away from me" spontaneously, not able to name or repeat.  Left gaze preference, barely cross midline, right neglect, not blinking to visual threat on the right but does on the Left.  Right facial droop, tongue midline, right upper extremity and lower extremity 4/5 with drift, RUE and RLE normal strength without drift. Able to hold a cup in her hand to her mouth on the Left. Sensation grossly intact.    ASSESSMENT/PLAN Ms. JASMINEMARIE SHERRARD is a 83 y.o. female with history of coronary artery disease, hypothyroidism, hyperlipidemia, renal artery stenosis, previous DVT, history of breast cancer, diabetes mellitus, dementia, previous strokes, chronic kidney disease, and cardiomyopathy presenting with aphasia and right-sided weakness. She did not receive IV t-PA due to late presentation.  Stroke - left MCA small frontoparietal infarct - embolic pattern, likely due to low EF. Undiagnosed afib not excluded.   Resultant global aphasia, right hemiparesis, right facial droop, right neglect, left gaze  CT head - No acute abnormality. Remote bilateral frontal infarcts  MRI head - not cooperative  CT repeat - left frontoparietal small infarcts  EEG - FIRDA but no seizure  CTA H&N - bilateral ICA proximal 60% stenosis, left  M2 stenosis  2D Echo - EF 25%, down from 40-45% in the past  LE venous Doppler negative for DVT  LDL - 131  HgbA1c - 8.6  VTE prophylaxis - SCDs Diet Carb Modified Fluid consistency: Thin; Room service appropriate? Yes  aspirin 81 mg daily and clopidogrel 75 mg daily prior to admission, now on aspirin 325 mg daily.   Ongoing aggressive stroke risk factor management  Therapy recommendations:  CIR  Disposition:  CLR  Cardiomyopathy with low EF  TTE showed EF 25%  08/2014 - EF 40-45%  Follows with Dr. Ellyn Hack in cardiology  Likely the source of embolic stroke  Cardiology on board, on lisinopril    Continue eliquis History of TIA/stroke  TIA in 04/2009 and 06/2009  1988 CVA- right eye vision difficulty  2011 left frontal infarct  04/2011 -right-sided weakness and aphasia - CT showed old left frontal infarct - MRI showed a left PLIC/thalamic infarct  Baseline mild dementia  Short-term memory deficit  But able to perform all ADLs  Still driving locally   Able to carry normal conversation  Need outpt follow up with neurology for monitoring  She has presented today with 2 episodes of focal seizures likely secondary to  irritability from a recent left MCA infarct. CT scan shows no acute findings. Recommend IV Keppra 1 g loading dose followed by 250 mg twice daily. Continue eliquis for stroke prevention and aggressive risk factor modification. Long discussion at the bedside with the patient and multiple family members and answered questions. Discussed with rehabilitation teampractitioner. Greater than 50% time during this 35 minute visit was spent on counseling and coordination of care about her seizures, stroke pending treatment, evaluation and answering questions.     Neurology will sign off. Please call with questions. Pt will follow up with stroke clinic at [facility] in about 4-6 weeks. Thanks for the consult. [place] for Pager  information 02/19/2018 6:29 PM    .      To contact Stroke Continuity provider, please refer to [email or web address] After hours, contact General Neurology

## 2018-02-19 NOTE — Progress Notes (Signed)
SLP Cancellation Note  Patient Details Name: [name] MRN: [id number] DOB: October 10, 1935   Cancelled treatment:       Patient missed 45 minutes of skilled SLP intervention secondary to patient on a medical hold due to change in medical status.                                                                                                 [place], [place] 02/19/2018, 9:48 AM

## 2018-02-19 NOTE — Progress Notes (Addendum)
Patient was up in the chair eating with family when she started having seizures. and became lethargic.Dr was informed, moved patient in the bed ,checked vitals signs and blood sugar ( stable).Reesa Chew ,PA ordered Keppra and CT scan STAT.Also,family was informed about all situation.

## 2018-02-19 NOTE — Progress Notes (Signed)
Physical Therapy Session Note  Patient Details  Name: [name] MRN: [id number] Date of Birth: [birth year]  Today's Date: 02/19/2018 PT Individual Time: 0800-0810 PT Individual Time Calculation (min): 10 min  Missed time: 50 minute- medical hold  Short Term Goals: Week 1:  PT Short Term Goal 1 (Week 1): Pt will transfer bed<>chair w/ min assist PT Short Term Goal 2 (Week 1): Pt will ambulate 50' w/ LRAD w/ min assist PT Short Term Goal 3 (Week 1): Pt will follow simple, 1-step commands during functional mobility 100% of the time PT Short Term Goal 4 (Week 1): Pt will maintain dynamic sitting balance w/ supervision  Skilled Therapeutic Interventions/Progress Updates:    Pt seated in w/c with daughter upon PT arrival, daughter calling for RN and reports that the pt's "whole body was shaking, I was just trying to feed her breakfast." RN arrived immediately, therapist transferred pt back to bed per MD request, squat pivot with max assist. Therapist assisted pt to supine and left in care of medical team.  Pt missed 50 minutes of skilled therapy tx this session secondary to medical status.   Therapy Documentation Precautions:  Precautions Precautions: Fall Restrictions Weight Bearing Restrictions: No   See Function Navigator for Current Functional Status.   Therapy/Group: Individual Therapy  Netta Corrigan, PT, DPT 02/19/2018, 7:44 AM

## 2018-02-19 NOTE — Procedures (Signed)
ELECTROENCEPHALOGRAM REPORT  Date of Study: 02/19/2018  Patient's Name: [name] MRN: [id number] Date of Birth: [birth year]  Referring Provider: Reesa Chew, PA-C  Clinical History: This is an 83 year old woman with seizure, lethargy  Medications: Keppra  Technical Summary: A multichannel digital EEG recording measured by the international 10-20 system with electrodes applied with paste and impedances below 5000 ohms performed as portable with EKG monitoring in a lethargic patient.  Hyperventilation and photic stimulation were not performed.  The digital EEG was referentially recorded, reformatted, and digitally filtered in a variety of bipolar and referential montages for optimal display.   Description: The patient is poorly responsive during the recording.  During maximal wakefulness, there is a poorly sustained low to medium voltage 7 Hz posterior dominant rhythm that poorly attenuates with eye opening and eye closure. This is admixed with a moderate amount of diffuse 4-5 Hz theta and 2-3 Hz delta slowing of the waking background, at times with triphasic appearance. There is occasional additional focal theta and delta slowing over the left temporal region, at times sharply contoured without clear epileptogenic potential. Normal sleep architecture is not seen. Hyperventilation and photic stimulation were not performed. There were no clear epileptiform discharges or electrographic seizures seen.    EKG lead was unremarkable.  Impression: This awake and drowsy EEG is abnormal due to the presence of: 1. Moderate diffuse slowing of the waking background, at times with triphasic appearance 2. Occasional focal slowing over the left temporal region  Clinical Correlation of the above findings indicates diffuse cerebral dysfunction that is non-specific in etiology and can be seen with hypoxic/ischemic injury, toxic/metabolic encephalopathies, neurodegenerative disorders, or medication  effect. Additional focal slowing over the left temporal region indicates focal cerebral dysfunction in this region suggestive of underlying structural or physiologic abnormality. The absence of epileptiform discharges does not rule out a clinical diagnosis of epilepsy.  Clinical correlation is advised.   Ellouise Newer, M.D.

## 2018-02-19 NOTE — Progress Notes (Signed)
EEG completed, results pending. 

## 2018-02-19 NOTE — Progress Notes (Signed)
Labs reviewed. Patient with AKI as well as hyperkalemia due to supplementation. Will d/c supplement and should improve with hydration. Will give a dose of kayexalate additionally. Check lytes in am. Will check CXR to rule out aspiration event.

## 2018-02-19 NOTE — Plan of Care (Signed)
  Problem: RH BLADDER ELIMINATION Goal: RH STG MANAGE BLADDER WITH ASSISTANCE Description STG Manage Bladder With  Min Assistance  02/19/2018 1700 by Glean Salen, RN Flowsheets (Taken 02/19/2018 1700) STG: Pt will manage bladder with assistance: 1-Total assistance 02/19/2018 1556 by Glean Salen, RN Outcome: Progressing Flowsheets (Taken 02/19/2018 1556) STG: Pt will manage bladder with assistance: 1-Total assistance   Problem: RH PAIN MANAGEMENT Goal: RH STG PAIN MANAGED AT OR BELOW PT'S PAIN GOAL Description At or below level 4  02/19/2018 1700 by Glean Salen, RN Outcome: Progressing 02/19/2018 1556 by Glean Salen, RN Outcome: Progressing

## 2018-02-19 NOTE — Plan of Care (Signed)
  Problem: Consults Goal: RH STROKE PATIENT EDUCATION Description See Patient Education module for education specifics  Outcome: Progressing   Problem: RH BOWEL ELIMINATION Goal: RH STG MANAGE BOWEL WITH ASSISTANCE Description STG Manage Bowel with mod I Assistance.  Outcome: Progressing Flowsheets (Taken 02/19/2018 1556) STG: Pt will manage bowels with assistance: 1-Total assistance Goal: RH STG MANAGE BOWEL W/MEDICATION W/ASSISTANCE Description STG Manage Bowel with Medication with mod I Assistance.  Outcome: Progressing Flowsheets (Taken 02/19/2018 1556) STG: Pt will manage bowels with medication with assistance: 1-Total assistance   Problem: RH BLADDER ELIMINATION Goal: RH STG MANAGE BLADDER WITH ASSISTANCE Description STG Manage Bladder With  Min Assistance  Outcome: Progressing Flowsheets (Taken 02/19/2018 1556) STG: Pt will manage bladder with assistance: 1-Total assistance   Problem: RH SAFETY Goal: RH STG ADHERE TO SAFETY PRECAUTIONS W/ASSISTANCE/DEVICE Description STG Adhere to Safety Precautions With  Mod cues/reminders/ Assistance/Device.   Outcome: Progressing Flowsheets (Taken 02/19/2018 1556) STG:Pt will adhere to safety precautions with assistance/device: 1-Total assistance   Problem: RH PAIN MANAGEMENT Goal: RH STG PAIN MANAGED AT OR BELOW PT'S PAIN GOAL Description At or below level 4  Outcome: Progressing   Problem: RH KNOWLEDGE DEFICIT Goal: RH STG INCREASE KNOWLEDGE OF DIABETES Description Family will be able to verbalize understanding of diabetes management, CBG monitoring and medications indepently with cues/reminders/resources provided  Outcome: Progressing Goal: RH STG INCREASE KNOWLEDGE OF HYPERTENSION Description Family will be able to state and demo understanding of HTN, management, medications and dietary restrictions independendly using cues/resources and handouts provided  Outcome: Progressing

## 2018-02-19 NOTE — Progress Notes (Signed)
Occupational Therapy Note  Patient Details  Name: SAFIA PANZER MRN: [id number] Date of Birth: 14-Apr-1935  Today's Date: 02/19/2018 OT Missed Time: 20 Minutes Missed Time Reason: MD hold (comment)(seizure activity)   Pt missed 60 minutes of skilled OT treatment 2/2 change in medical status and on medical hold. Will follow up pending plan of care.    Daneen Schick Pennie Vanblarcom 02/19/2018, 11:23 AM

## 2018-02-19 NOTE — Progress Notes (Addendum)
Subjective/Complaints:  Poor intake, family gave her a shake ans CBGs went up to >400  Called to room.  Family was feeding the patient and they noted shaking of all 4 limbs as well as reduced responsiveness. Recheck CBG was 240. O2 sat 97% on room air. Vital signs were stable afebrile Patient would withdraw to pain on the right/involved side.  Remains a phasic.     ROS- cannot obtain due to aphasia Objective: Vital Signs: Blood pressure 138/68, pulse 80, temperature 99.9 F (37.7 C), temperature source Axillary, resp. rate 18, height 5\' 3"  (1.6 m), weight 60 kg (132 lb 4.4 oz), SpO2 98 %. No results found. Results for orders placed or performed during the hospital encounter of 02/13/18 (from the past 72 hour(s))  Glucose, capillary     Status: Abnormal   Collection Time: 02/16/18 11:37 AM  Result Value Ref Range   Glucose-Capillary 287 (H) 65 - 99 mg/dL  Culture, Urine     Status: Abnormal   Collection Time: 02/16/18  3:03 PM  Result Value Ref Range   Specimen Description URINE, CLEAN CATCH    Special Requests      NONE Performed at [facility], [place] [address] [place], [place] [address]    Culture MULTIPLE SPECIES PRESENT, SUGGEST RECOLLECTION (A)    Report Status 02/17/2018 FINAL   Glucose, capillary     Status: Abnormal   Collection Time: 02/16/18  4:26 PM  Result Value Ref Range   Glucose-Capillary 328 (H) 65 - 99 mg/dL  Glucose, capillary     Status: Abnormal   Collection Time: 02/16/18  9:42 PM  Result Value Ref Range   Glucose-Capillary 224 (H) 65 - 99 mg/dL  Glucose, capillary     Status: Abnormal   Collection Time: 02/17/18  6:42 AM  Result Value Ref Range   Glucose-Capillary 255 (H) 65 - 99 mg/dL  Glucose, capillary     Status: Abnormal   Collection Time: 02/17/18 11:46 AM  Result Value Ref Range   Glucose-Capillary 285 (H) 65 - 99 mg/dL  Glucose, capillary     Status: Abnormal   Collection Time: 02/17/18  4:38 PM  Result Value Ref Range    Glucose-Capillary 351 (H) 65 - 99 mg/dL  Glucose, capillary     Status: Abnormal   Collection Time: 02/17/18  9:30 PM  Result Value Ref Range   Glucose-Capillary 326 (H) 65 - 99 mg/dL  Glucose, capillary     Status: Abnormal   Collection Time: 02/18/18  6:29 AM  Result Value Ref Range   Glucose-Capillary 258 (H) 65 - 99 mg/dL  Glucose, capillary     Status: Abnormal   Collection Time: 02/18/18 11:24 AM  Result Value Ref Range   Glucose-Capillary 368 (H) 65 - 99 mg/dL  Glucose, capillary     Status: Abnormal   Collection Time: 02/18/18  4:56 PM  Result Value Ref Range   Glucose-Capillary 457 (H) 65 - 99 mg/dL  Glucose, capillary     Status: Abnormal   Collection Time: 02/18/18  6:33 PM  Result Value Ref Range   Glucose-Capillary 481 (H) 65 - 99 mg/dL  Glucose, capillary     Status: Abnormal   Collection Time: 02/18/18  9:00 PM  Result Value Ref Range   Glucose-Capillary 331 (H) 65 - 99 mg/dL  Glucose, capillary     Status: Abnormal   Collection Time: 02/19/18  6:33 AM  Result Value Ref Range   Glucose-Capillary 217 (H) 65 -  99 mg/dL     HEENT: normal Cardio: RRR and no murmur Resp: CTA B/L and no wheezes GI: BS positive and NT, ND Extremity:  No Edema Skin:   Bruise forearms Neuro: Abnormal Sensory reduced sensation to pinch Right Upper and Lower limb, Abnormal Motor 3/5 RUE and RLE, Abnormal FMC Ataxic/ dec FMC and Aphasic Musc/Skel:  Other no pain with UE or LE ROM Gen NAD   Assessment/Plan: 1. Functional deficits secondary to Left thalamic and Left parietal infarct which require 3+ hours per day of interdisciplinary therapy in a comprehensive inpatient rehab setting. Physiatrist is providing close team supervision and 24 hour management of active medical problems listed below. Physiatrist and rehab team continue to assess barriers to discharge/monitor patient progress toward functional and medical goals. FIM: Function - Bathing Position: Shower Body parts bathed by  patient: Right arm, Chest, Left upper leg Body parts bathed by helper: Left arm, Abdomen, Front perineal area, Buttocks, Right upper leg, Right lower leg, Left lower leg, Back Bathing not applicable: Buttocks Assist Level: Touching or steadying assistance(Pt > 75%)  Function- Upper Body Dressing/Undressing What is the patient wearing?: Pull over shirt/dress Pull over shirt/dress - Perfomed by patient: Thread/unthread right sleeve, Thread/unthread left sleeve, Put head through opening, Pull shirt over trunk Pull over shirt/dress - Perfomed by helper: Thread/unthread right sleeve, Thread/unthread left sleeve, Pull shirt over trunk, Put head through opening Assist Level: Touching or steadying assistance(Pt > 75%) Function - Lower Body Dressing/Undressing What is the patient wearing?: Non-skid slipper socks Position: Wheelchair/chair at sink Non-skid slipper socks- Performed by patient: Don/doff right sock Non-skid slipper socks- Performed by helper: Don/doff right sock, Don/doff left sock Assist for footwear: Dependant Assist for lower body dressing: Touching or steadying assistance (Pt > 75%)  Function - Toileting Toileting steps completed by patient: Performs perineal hygiene Toileting steps completed by helper: Adjust clothing after toileting, Adjust clothing prior to toileting, Performs perineal hygiene Toileting Assistive Devices: Grab bar or rail Assist level: Touching or steadying assistance (Pt.75%)  Function - Air cabin crew transfer assistive device: Grab bar Assist level to toilet: Maximal assist (Pt 25 - 49%/lift and lower) Assist level from toilet: Maximal assist (Pt 25 - 49%/lift and lower) Assist level to bedside commode (at bedside): 2 helpers Assist level from bedside commode (at bedside): 2 helpers  Function - Chair/bed transfer Chair/bed transfer method: Stand pivot Chair/bed transfer assist level: Moderate assist (Pt 50 - 74%/lift or lower) Chair/bed  transfer assistive device: Armrests Chair/bed transfer details: Verbal cues for technique, Manual facilitation for placement, Manual facilitation for weight shifting, Tactile cues for sequencing, Tactile cues for initiation, Tactile cues for placement  Function - Locomotion: Wheelchair Will patient use wheelchair at discharge?: No Assist Level: Dependent (Pt equals 0%)(for transport) Function - Locomotion: Ambulation Assistive device: Walker-rolling Max distance: 58' Assist level: Moderate assist (Pt 50 - 74%) Assist level: Moderate assist (Pt 50 - 74%) Walk 50 feet with 2 turns activity did not occur: Safety/medical concerns Assist level: Moderate assist (Pt 50 - 74%) Walk 150 feet activity did not occur: Safety/medical concerns Walk 10 feet on uneven surfaces activity did not occur: Safety/medical concerns  Function - Comprehension Comprehension: Auditory Comprehension assist level: Understands basic less than 25% of the time/ requires cueing >75% of the time  Function - Expression Expression: Verbal Expression assist level: Expresses basis less than 25% of the time/requires cueing >75% of the time.  Function - Social Interaction Social Interaction assist level: Interacts appropriately less than 25% of  the time. May be withdrawn or combative.  Function - Problem Solving Problem solving assist level: Solves basic less than 25% of the time - needs direction nearly all the time or does not effectively solve problems and may need a restraint for safety  Function - Memory Memory assist level: Recognizes or recalls less than 25% of the time/requires cueing greater than 75% of the time Patient normally able to recall (first 3 days only): None of the above  Medical Problem List and Plan:  1. Functional deficits secondary to infarcts in left thalamus and left parietal periventricular white matter  -admit to inpatient rehab  PT, OT, SLP CIR level 2. DVT Prophylaxis/Anticoagulation:  Pharmaceutical: Lovenox and Other (comment)  3. Pain Management: tylenol prn  4. Mood: Patient currently with poor awareness of deficits. LCSW to follow for evaluation when appropriate.  -pt has demonstrating increased confusion and agitation at night, poor sleep patterns  -check sleep chart  -begin scheduled, low dose seroquel  5. Neuropsych: This patient is not capable of making decisions on her own behalf.  6. Skin/Wound Care: routine pressure relief measures  7. Fluids/Electrolytes/Nutrition: Monitor I/O.444ml fluid on 3/31 Offer supplements if intake poor.  8. Chronic combined systolic/diastolic CHF/chemo induced CM: Heart healthy diet. Monitor for signs of overload and check weights daily. BB discontinued due to bradycardia. Continue Lisinopril daily. Used lasix prn edema at home.  Vitals:   02/18/18 1440 02/19/18 0145  BP: 112/70 138/68  Pulse: 76 80  Resp: 17 18  Temp: 97.7 F (36.5 C) 99.9 F (37.7 C)  SpO2: 100% 98%  BP controlled 9. CAD w/p PTCA: On ASA and Zetia.  10. CKD: Avoid nephrotoxic medications. Monitor with serial checks.  11.T2DM: Hgb A1C- 8.6. Blood sugars poorly controlled.--Used Lantus 80 units with metformin at nights. Increase Lantus to 15 units daily and titrate as needed. Monitor BS ac/hs. Uncontrolled 4/1, increase lantus to 25U CBG (last 3)  Recent Labs    02/18/18 1833 02/18/18 2100 02/19/18 0633  GLUCAP 481* 331* 217*  12. Hypothyroid: On supplement.   #13.  Probable seizure, generalized, discussed with Dr. Leonie Man, will load with Keppra 1 g repeat CT head, check EEG neurology to evaluate today   LOS (Days) 6 A FACE TO FACE EVALUATION WAS PERFORMED  Charlett Blake 02/19/2018, 7:44 AM

## 2018-02-20 ENCOUNTER — Inpatient Hospital Stay (HOSPITAL_COMMUNITY): Payer: Medicare Other | Admitting: Speech Pathology

## 2018-02-20 ENCOUNTER — Inpatient Hospital Stay (HOSPITAL_COMMUNITY): Payer: Medicare Other

## 2018-02-20 ENCOUNTER — Inpatient Hospital Stay (HOSPITAL_COMMUNITY): Payer: Medicare Other | Admitting: Occupational Therapy

## 2018-02-20 LAB — BASIC METABOLIC PANEL
ANION GAP: 9 (ref 5–15)
BUN: 22 mg/dL — ABNORMAL HIGH (ref 6–20)
CO2: 23 mmol/L (ref 22–32)
Calcium: 8.7 mg/dL — ABNORMAL LOW (ref 8.9–10.3)
Chloride: 104 mmol/L (ref 101–111)
Creatinine, Ser: 1.2 mg/dL — ABNORMAL HIGH (ref 0.44–1.00)
GFR calc non Af Amer: 42 mL/min — ABNORMAL LOW (ref >60)
GFR, EST AFRICAN AMERICAN: 48 mL/min — AB (ref >60)
GLUCOSE: 193 mg/dL — AB (ref 65–99)
POTASSIUM: 4.7 mmol/L (ref 3.5–5.1)
SODIUM: 136 mmol/L (ref 135–145)

## 2018-02-20 LAB — CBC WITH DIFFERENTIAL/PLATELET
Basophils Absolute: 0 K/uL (ref 0.0–0.1)
Basophils Relative: 0 %
Eosinophils Absolute: 0.1 K/uL (ref 0.0–0.7)
Eosinophils Relative: 1 %
HCT: 40.4 % (ref 36.0–46.0)
Hemoglobin: 13.7 g/dL (ref 12.0–15.0)
Lymphocytes Relative: 26 %
Lymphs Abs: 2.5 K/uL (ref 0.7–4.0)
MCH: 32.2 pg (ref 26.0–34.0)
MCHC: 33.9 g/dL (ref 30.0–36.0)
MCV: 95.1 fL (ref 78.0–100.0)
Monocytes Absolute: 0.6 K/uL (ref 0.1–1.0)
Monocytes Relative: 6 %
Neutro Abs: 6.5 K/uL (ref 1.7–7.7)
Neutrophils Relative %: 67 %
Platelets: 204 K/uL (ref 150–400)
RBC: 4.25 MIL/uL (ref 3.87–5.11)
RDW: 12.9 % (ref 11.5–15.5)
WBC: 9.6 K/uL (ref 4.0–10.5)

## 2018-02-20 LAB — GLUCOSE, CAPILLARY
GLUCOSE-CAPILLARY: 233 mg/dL — AB (ref 65–99)
Glucose-Capillary: 180 mg/dL — ABNORMAL HIGH (ref 65–99)
Glucose-Capillary: 199 mg/dL — ABNORMAL HIGH (ref 65–99)
Glucose-Capillary: 283 mg/dL — ABNORMAL HIGH (ref 65–99)

## 2018-02-20 MED ORDER — LEVETIRACETAM 100 MG/ML PO SOLN
250.0000 mg | Freq: Two times a day (BID) | ORAL | Status: DC
  Administered 2018-02-20 – 2018-02-27 (×13): 250 mg via ORAL
  Filled 2018-02-20 (×15): qty 5

## 2018-02-20 NOTE — Care Management Important Message (Signed)
Important Message  Patient Details  Name: [name] MRN: [id number] Date of Birth: 1935-04-07   Medicare Important Message Given:  Yes    Taylee Gunnells 02/20/2018, 2:30 PM

## 2018-02-20 NOTE — Progress Notes (Signed)
Subjective/Complaints:  No further seizures , appreciate neuro note CXR neg Labs reveiwed   Pt remains globally aphasic.     ROS- cannot obtain due to aphasia Objective: Vital Signs: Blood pressure (!) 159/77, pulse 72, temperature (!) 97.5 F (36.4 C), resp. rate 17, height 5' 3"  (1.6 m), weight 60 kg (132 lb 4.4 oz), SpO2 100 %. Dg Chest 1 View  Result Date: 02/19/2018 CLINICAL DATA:  Recent seizure activity EXAM: CHEST  1 VIEW COMPARISON:  02/15/2018 FINDINGS: Cardiac shadow is stable. Postoperative changes are again seen. The lungs are well aerated bilaterally. No focal infiltrate or sizable effusion is seen. No bony abnormality is noted. IMPRESSION: No active disease. Electronically Signed   By: Inez Catalina M.D.   On: 02/19/2018 12:42   Ct Head Wo Contrast  Result Date: 02/19/2018 CLINICAL DATA:  Follow up stroke Patient who had seizures this am EXAM: CT HEAD WITHOUT CONTRAST TECHNIQUE: Contiguous axial images were obtained from the base of the skull through the vertex without intravenous contrast. COMPARISON:  02/11/2018 FINDINGS: Brain: The left posterior MCA distribution infarct has evolved since the prior exam. There is now well-defined hypoattenuation that extends from the inferior left parietal lobe to the posterior left temporal lobe. There is mild associated mass effect with mild compression of the atrium and posterior body of the left lateral ventricle. There are no new areas of infarction. Small old areas of infarction are noted along both posterior frontal lobes. Old right deep white matter lacune infarct just above the basal ganglia. Patchy areas of white matter hypoattenuation are noted consistent with mild chronic microvascular ischemic change. The ventricles are normal in size for this patient's age. There are no parenchymal or extra-axial masses. No intracranial hemorrhage. Vascular: No hyperdense vessel or unexpected calcification. Skull: Normal. Negative for fracture or  focal lesion. Sinuses/Orbits: Globes and orbits are unremarkable. The left sphenoid sinus is mostly opacified with chronic mucosal thickening and secretions, stable from the prior study. Remaining sinuses are clear. Clear mastoid air cells. Other: None. IMPRESSION: 1. Normal expected evolution of the left posterior MCA distribution infarct since the prior study. There are no new areas of infarction and there is no intracranial hemorrhage. No hydrocephalus. Electronically Signed   By: Lajean Manes M.D.   On: 02/19/2018 10:49   Results for orders placed or performed during the hospital encounter of 02/13/18 (from the past 72 hour(s))  Glucose, capillary     Status: Abnormal   Collection Time: 02/17/18 11:46 AM  Result Value Ref Range   Glucose-Capillary 285 (H) 65 - 99 mg/dL  Glucose, capillary     Status: Abnormal   Collection Time: 02/17/18  4:38 PM  Result Value Ref Range   Glucose-Capillary 351 (H) 65 - 99 mg/dL  Glucose, capillary     Status: Abnormal   Collection Time: 02/17/18  9:30 PM  Result Value Ref Range   Glucose-Capillary 326 (H) 65 - 99 mg/dL  Glucose, capillary     Status: Abnormal   Collection Time: 02/18/18  6:29 AM  Result Value Ref Range   Glucose-Capillary 258 (H) 65 - 99 mg/dL  Glucose, capillary     Status: Abnormal   Collection Time: 02/18/18 11:24 AM  Result Value Ref Range   Glucose-Capillary 368 (H) 65 - 99 mg/dL  Glucose, capillary     Status: Abnormal   Collection Time: 02/18/18  4:56 PM  Result Value Ref Range   Glucose-Capillary 457 (H) 65 - 99 mg/dL  Glucose, capillary  Status: Abnormal   Collection Time: 02/18/18  6:33 PM  Result Value Ref Range   Glucose-Capillary 481 (H) 65 - 99 mg/dL  Glucose, capillary     Status: Abnormal   Collection Time: 02/18/18  9:00 PM  Result Value Ref Range   Glucose-Capillary 331 (H) 65 - 99 mg/dL  Basic metabolic panel     Status: Abnormal   Collection Time: 02/19/18  6:01 AM  Result Value Ref Range   Sodium 134  (L) 135 - 145 mmol/L   Potassium 5.7 (H) 3.5 - 5.1 mmol/L   Chloride 101 101 - 111 mmol/L   CO2 24 22 - 32 mmol/L   Glucose, Bld 220 (H) 65 - 99 mg/dL   BUN 24 (H) 6 - 20 mg/dL   Creatinine, Ser 1.37 (H) 0.44 - 1.00 mg/dL   Calcium 9.5 8.9 - 10.3 mg/dL   GFR calc non Af Amer 35 (L) >60 mL/min   GFR calc Af Amer 41 (L) >60 mL/min    Comment: (NOTE) The eGFR has been calculated using the CKD EPI equation. This calculation has not been validated in all clinical situations. eGFR's persistently <60 mL/min signify possible Chronic Kidney Disease.    Anion gap 9 5 - 15    Comment: Performed at [facility] [address] [place], [place] [address]  CBC     Status: Abnormal   Collection Time: 02/19/18  6:01 AM  Result Value Ref Range   WBC 9.3 4.0 - 10.5 K/uL   RBC 4.67 3.87 - 5.11 MIL/uL   Hemoglobin 15.3 (H) 12.0 - 15.0 g/dL   HCT 44.4 36.0 - 46.0 %   MCV 95.1 78.0 - 100.0 fL   MCH 32.8 26.0 - 34.0 pg   MCHC 34.5 30.0 - 36.0 g/dL   RDW 13.1 11.5 - 15.5 %   Platelets 208 150 - 400 K/uL    Comment: Performed at [facility], [place] [address] [place], [place] [address]  Magnesium     Status: Abnormal   Collection Time: 02/19/18  6:01 AM  Result Value Ref Range   Magnesium 2.7 (H) 1.7 - 2.4 mg/dL    Comment: Performed at [facility] [address] [place], [place] [address]  Glucose, capillary     Status: Abnormal   Collection Time: 02/19/18  6:33 AM  Result Value Ref Range   Glucose-Capillary 217 (H) 65 - 99 mg/dL  Glucose, capillary     Status: Abnormal   Collection Time: 02/19/18  8:14 AM  Result Value Ref Range   Glucose-Capillary 244 (H) 65 - 99 mg/dL  Glucose, capillary     Status: Abnormal   Collection Time: 02/19/18  9:55 AM  Result Value Ref Range   Glucose-Capillary 288 (H) 65 - 99 mg/dL  Glucose, capillary     Status: Abnormal   Collection Time: 02/19/18 11:35 AM  Result Value Ref Range   Glucose-Capillary 282 (H) 65 - 99 mg/dL  Glucose,  capillary     Status: Abnormal   Collection Time: 02/19/18  4:38 PM  Result Value Ref Range   Glucose-Capillary 214 (H) 65 - 99 mg/dL  Basic metabolic panel     Status: Abnormal   Collection Time: 02/19/18  5:16 PM  Result Value Ref Range   Sodium 134 (L) 135 - 145 mmol/L   Potassium 5.2 (H) 3.5 - 5.1 mmol/L   Chloride 102 101 - 111 mmol/L   CO2 23 22 - 32  mmol/L   Glucose, Bld 210 (H) 65 - 99 mg/dL   BUN 25 (H) 6 - 20 mg/dL   Creatinine, Ser 1.27 (H) 0.44 - 1.00 mg/dL   Calcium 9.0 8.9 - 10.3 mg/dL   GFR calc non Af Amer 39 (L) >60 mL/min   GFR calc Af Amer 45 (L) >60 mL/min    Comment: (NOTE) The eGFR has been calculated using the CKD EPI equation. This calculation has not been validated in all clinical situations. eGFR's persistently <60 mL/min signify possible Chronic Kidney Disease.    Anion gap 9 5 - 15    Comment: Performed at [facility] [address], [place], [place] [address]  Glucose, capillary     Status: Abnormal   Collection Time: 02/19/18  8:40 PM  Result Value Ref Range   Glucose-Capillary 165 (H) 65 - 99 mg/dL  CBC with Differential/Platelet     Status: None   Collection Time: 02/20/18  4:40 AM  Result Value Ref Range   WBC 9.6 4.0 - 10.5 K/uL   RBC 4.25 3.87 - 5.11 MIL/uL   Hemoglobin 13.7 12.0 - 15.0 g/dL   HCT 40.4 36.0 - 46.0 %   MCV 95.1 78.0 - 100.0 fL   MCH 32.2 26.0 - 34.0 pg   MCHC 33.9 30.0 - 36.0 g/dL   RDW 12.9 11.5 - 15.5 %   Platelets 204 150 - 400 K/uL   Neutrophils Relative % 67 %   Neutro Abs 6.5 1.7 - 7.7 K/uL   Lymphocytes Relative 26 %   Lymphs Abs 2.5 0.7 - 4.0 K/uL   Monocytes Relative 6 %   Monocytes Absolute 0.6 0.1 - 1.0 K/uL   Eosinophils Relative 1 %   Eosinophils Absolute 0.1 0.0 - 0.7 K/uL   Basophils Relative 0 %   Basophils Absolute 0.0 0.0 - 0.1 K/uL    Comment: Performed at [facility], [place] [address], [place], [place] [address]  Basic metabolic panel     Status: Abnormal   Collection Time:  02/20/18  4:40 AM  Result Value Ref Range   Sodium 136 135 - 145 mmol/L   Potassium 4.7 3.5 - 5.1 mmol/L   Chloride 104 101 - 111 mmol/L   CO2 23 22 - 32 mmol/L   Glucose, Bld 193 (H) 65 - 99 mg/dL   BUN 22 (H) 6 - 20 mg/dL   Creatinine, Ser 1.20 (H) 0.44 - 1.00 mg/dL   Calcium 8.7 (L) 8.9 - 10.3 mg/dL   GFR calc non Af Amer 42 (L) >60 mL/min   GFR calc Af Amer 48 (L) >60 mL/min    Comment: (NOTE) The eGFR has been calculated using the CKD EPI equation. This calculation has not been validated in all clinical situations. eGFR's persistently <60 mL/min signify possible Chronic Kidney Disease.    Anion gap 9 5 - 15    Comment: Performed at [facility] [address], [place], [place] [address]  Glucose, capillary     Status: Abnormal   Collection Time: 02/20/18  6:28 AM  Result Value Ref Range   Glucose-Capillary 180 (H) 65 - 99 mg/dL     HEENT: normal Cardio: RRR and no murmur Resp: CTA B/L and no wheezes GI: BS positive and NT, ND Extremity:  No Edema Skin:   Bruise forearms Neuro: Abnormal Sensory reduced sensation to pinch Right Upper and Lower limb, Abnormal Motor 3/5 RUE and RLE, Abnormal FMC Ataxic/ dec FMC and  Aphasic Musc/Skel:  Other no pain with UE or LE ROM Gen NAD   Assessment/Plan: 1. Functional deficits secondary to Left thalamic and Left parietal infarct which require 3+ hours per day of interdisciplinary therapy in a comprehensive inpatient rehab setting. Physiatrist is providing close team supervision and 24 hour management of active medical problems listed below. Physiatrist and rehab team continue to assess barriers to discharge/monitor patient progress toward functional and medical goals. FIM: Function - Bathing Position: Shower Body parts bathed by patient: Right arm, Chest, Left upper leg Body parts bathed by helper: Left arm, Abdomen, Front perineal area, Buttocks, Right upper leg, Right lower leg, Left lower leg, Back Bathing not applicable:  Buttocks Assist Level: Touching or steadying assistance(Pt > 75%)  Function- Upper Body Dressing/Undressing What is the patient wearing?: Pull over shirt/dress Pull over shirt/dress - Perfomed by patient: Thread/unthread right sleeve, Thread/unthread left sleeve, Put head through opening, Pull shirt over trunk Pull over shirt/dress - Perfomed by helper: Thread/unthread right sleeve, Thread/unthread left sleeve, Pull shirt over trunk, Put head through opening Assist Level: Touching or steadying assistance(Pt > 75%) Function - Lower Body Dressing/Undressing What is the patient wearing?: Non-skid slipper socks Position: Wheelchair/chair at sink Non-skid slipper socks- Performed by patient: Don/doff right sock Non-skid slipper socks- Performed by helper: Don/doff right sock, Don/doff left sock Assist for footwear: Dependant Assist for lower body dressing: Touching or steadying assistance (Pt > 75%)  Function - Toileting Toileting activity did not occur: Safety/medical concerns Toileting steps completed by patient: Performs perineal hygiene Toileting steps completed by helper: Adjust clothing after toileting, Adjust clothing prior to toileting, Performs perineal hygiene Toileting Assistive Devices: Grab bar or rail Assist level: Touching or steadying assistance (Pt.75%)  Function - Air cabin crew transfer activity did not occur: Safety/medical concerns Toilet transfer assistive device: Grab bar Assist level to toilet: Maximal assist (Pt 25 - 49%/lift and lower) Assist level from toilet: Maximal assist (Pt 25 - 49%/lift and lower) Assist level to bedside commode (at bedside): 2 helpers Assist level from bedside commode (at bedside): 2 helpers  Function - Chair/bed transfer Chair/bed transfer method: Squat pivot Chair/bed transfer assist level: Maximal assist (Pt 25 - 49%/lift and lower) Chair/bed transfer assistive device: Armrests Chair/bed transfer details: Verbal cues for  technique, Manual facilitation for placement, Manual facilitation for weight shifting, Tactile cues for sequencing, Tactile cues for initiation, Tactile cues for placement  Function - Locomotion: Wheelchair Will patient use wheelchair at discharge?: No Assist Level: Dependent (Pt equals 0%)(for transport) Function - Locomotion: Ambulation Assistive device: Walker-rolling Max distance: 24' Assist level: Moderate assist (Pt 50 - 74%) Assist level: Moderate assist (Pt 50 - 74%) Walk 50 feet with 2 turns activity did not occur: Safety/medical concerns Assist level: Moderate assist (Pt 50 - 74%) Walk 150 feet activity did not occur: Safety/medical concerns Walk 10 feet on uneven surfaces activity did not occur: Safety/medical concerns  Function - Comprehension Comprehension: Auditory Comprehension assist level: Understands basic less than 25% of the time/ requires cueing >75% of the time  Function - Expression Expression: Verbal Expression assist level: Expresses basis less than 25% of the time/requires cueing >75% of the time.  Function - Social Interaction Social Interaction assist level: Interacts appropriately less than 25% of the time. May be withdrawn or combative.  Function - Problem Solving Problem solving assist level: Solves basic less than 25% of the time - needs direction nearly all the time or does not effectively solve problems and may need a restraint for  safety  Function - Memory Memory assist level: Recognizes or recalls less than 25% of the time/requires cueing greater than 75% of the time Patient normally able to recall (first 3 days only): None of the above  Medical Problem List and Plan:  1. Functional deficits secondary to infarcts in left thalamus and left parietal periventricular white matter  -admit to inpatient rehab  PT, OT, SLP CIR level, Team conf in am 2. DVT Prophylaxis/Anticoagulation: Pharmaceutical: Lovenox and Other (comment)  3. Pain Management:  tylenol prn  4. Mood: Patient currently with poor awareness of deficits. LCSW to follow for evaluation when appropriate.  -pt has demonstrating increased confusion and agitation at night, poor sleep patterns  -check sleep chart  -begin scheduled, low dose seroquel  5. Neuropsych: This patient is not capable of making decisions on her own behalf.  6. Skin/Wound Care: routine pressure relief measures  7. Fluids/Electrolytes/Nutrition: encourage fluids, K is corrected 8. Chronic combined systolic/diastolic CHF/chemo induced CM: Heart healthy diet. Monitor for signs of overload and check weights daily. BB discontinued due to bradycardia. Continue Lisinopril daily. Used lasix prn edema at home.  Vitals:   02/19/18 1347 02/20/18 0525  BP: (!) 111/95 (!) 159/77  Pulse: 73 72  Resp: 17   Temp: (!) 97.4 F (36.3 C) (!) 97.5 F (36.4 C)  SpO2: 100% 100%  BP controlled 9. CAD w/p PTCA: On ASA and Zetia.  10. CKD: Avoid nephrotoxic medications. Monitor with serial checks.  11.T2DM: Hgb A1C- 8.6. Blood sugars poorly controlled.--Used Lantus 80 units with metformin at nights. Increase Lantus to 15 units daily and titrate as needed. Monitor BS ac/hs.fair control 4/2, increase lantus to 25U CBG (last 3)  Recent Labs    02/19/18 1638 02/19/18 2040 02/20/18 0628  GLUCAP 214* 165* 180*  12. Hypothyroid: On supplement.   #13. 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

## 2018-02-20 NOTE — Progress Notes (Signed)
Occupational Therapy Session Note  Patient Details  Name: [name] MRN: [id number] Date of Birth: 25-Aug-1935  Today's Date: 02/20/2018 OT Individual Time: 8657-8469 OT Individual Time Calculation (min): 70 min    Short Term Goals: Week 1:  OT Short Term Goal 1 (Week 1): Pt will safely transfer from w/c to toilet with min A  OT Short Term Goal 2 (Week 1): Pt will complete oral hygiene with min multimodal cues OT Short Term Goal 3 (Week 1): Pt will complete seated level LB dressing with stand by assist  Skilled Therapeutic Interventions/Progress Updates:    Pt greeted sitting in wc with family present. Continues to have word salad repeating nonsensicle sentences. Pt brought to therapy gym in wc. Stand-pivot to therapy mat with min/mod A, increased time, and multimodal cues. Engaged pt in B UE ball toss activity with hand over hand A to try to integrate L UE. Pt would bring L UE to midline but had difficulty maintaining grip on ball. Pt easily distracted when other patients entered gym and required increased time and multimodal cues to try to regain attention to task. Attempted horse shoe activity, but pt unable to associate horse shoes to toss purposefully.  B UE folding activity seated on therapy mat with increased time and multimodal cues to initiate folding. Poor coordination with R UE overshooting at every attempt to fold. Pt's grandson entered room and pt was excited to see him, but unable to voice name or relation. Pt transferred back to wc in similar fashion and returned to room. Provided pt with pink foam to try to squeeze with R UE and educated family member on purposeful movements of R hand. Per family request, pt left up in wc with safety belt on and family present.   Therapy Documentation Precautions:  Precautions Precautions: Fall Restrictions Weight Bearing Restrictions: No Pain:   none/denies pain See Function Navigator for Current Functional Status.   Therapy/Group:  Individual Therapy  Valma Cava 02/20/2018, 3:27 PM

## 2018-02-20 NOTE — Progress Notes (Signed)
Occupational Therapy Session Note  Patient Details  Name: [name] MRN: [id number] Date of Birth: 1935/08/04  Today's Date: 02/20/2018 OT Individual Time: 1130-1200 OT Individual Time Calculation (min): 30 min    Short Term Goals: Week 1:  OT Short Term Goal 1 (Week 1): Pt will safely transfer from w/c to toilet with min A  OT Short Term Goal 2 (Week 1): Pt will complete oral hygiene with min multimodal cues OT Short Term Goal 3 (Week 1): Pt will complete seated level LB dressing with stand by assist Week 2:     Skilled Therapeutic Interventions/Progress Updates:    Focus on participation in functional tasks, attention to a task (sustained) and following one step commands with multimodal cues. Began with table task trying for isolated UE use but self distracting and unable to visual and cognitively attend to task even through presented at midline.  Transitioned to bilateral task of throwing and catching ball with increased appropriate use of right hand and more automatic.  Transitioned to bilateral HHA ambulation in the hallway ~70 feet - positioning around pt's trunk on the right side (instead of holding her right hand- resulting in increased pushing. Min A with +2.   Began incontinent of urine in session during ambulation. Returned to room and changed in standing with min A for balance and total A for hygiene. Left sitting up with daughter in w/c with safety belt.  Therapy Documentation Precautions:  Precautions Precautions: Fall Restrictions Weight Bearing Restrictions: No Pain:   no c/o pain  See Function Navigator for Current Functional Status.   Therapy/Group: Individual Therapy  Willeen Cass [facility] 02/20/2018, 2:10 PM

## 2018-02-20 NOTE — Progress Notes (Signed)
Speech Language Pathology Daily Session Note  Patient Details  Name: [name] MRN: [id number] Date of Birth: 07/23/1935  Today's Date: 02/20/2018 SLP Individual Time: 4098-1191 SLP Individual Time Calculation (min): 45 min  Short Term Goals: Week 1: SLP Short Term Goal 1 (Week 1): Patient will identify functional items from a field of 2 with 50% accuracy with Max A multimodal cues.  SLP Short Term Goal 2 (Week 1): Patient will answer basic yes/no questions in regards to biographical information with 50% accuracy and Max A multimodal cues.  SLP Short Term Goal 3 (Week 1): Patient will follow basic 1-step directions during functional tasks with Max A multimodal cues.  SLP Short Term Goal 4 (Week 1): Patient will complete basic and functional problem solving tasks with Max A multimodal cues.  SLP Short Term Goal 5 (Week 1): Patient will demonstrate sustained attention for 5-minute intervals during functional tasks with Max A verbal cues.  SLP Short Term Goal 6 (Week 1): Patient will self-monitor verbal errors x2 within a 45-minute session with Max A multimodal cues.   Skilled Therapeutic Interventions: Skilled treatment session focused on cognitive-linguistic goals.  Upon arrival, patient was awake but restless while in bed. Patient attempting to take gown off and utilized gestures to communicate she need to use the bathroom. Patient was total A to sit EOB and was transferred to [facility] with +2 assist for safety. Patient was incontinent of bowel and required total +2 assist for hygiene. Throughout session, patient required Max A multimodal cues to follow commands within functional tasks with fluent jargon that patient required Max A multimodal cues to self-monitor. Patient left upright in wheelchair with quick release belt in place and daughter present. Continue with current plan of care.       Function:   Cognition Comprehension Comprehension assist level: Understands basic less than 25% of  the time/ requires cueing >75% of the time  Expression   Expression assist level: Expresses basis less than 25% of the time/requires cueing >75% of the time.  Social Interaction Social Interaction assist level: Interacts appropriately less than 25% of the time. May be withdrawn or combative.  Problem Solving Problem solving assist level: Solves basic less than 25% of the time - needs direction nearly all the time or does not effectively solve problems and may need a restraint for safety  Memory Memory assist level: Recognizes or recalls less than 25% of the time/requires cueing greater than 75% of the time    Pain No/Denies Pain   Therapy/Group: Individual Therapy  Orry Sigl 02/20/2018, 2:39 PM

## 2018-02-20 NOTE — Progress Notes (Signed)
Physical Therapy Session Note  Patient Details  Name: [name] MRN: [id number] Date of Birth: [birth year]  Today's Date: 02/20/2018 PT Individual Time: 1300-1400 PT Individual Time Calculation (min): 60 min   Short Term Goals: Week 1:  PT Short Term Goal 1 (Week 1): Pt will transfer bed<>chair w/ min assist PT Short Term Goal 2 (Week 1): Pt will ambulate 50' w/ LRAD w/ min assist PT Short Term Goal 3 (Week 1): Pt will follow simple, 1-step commands during functional mobility 100% of the time PT Short Term Goal 4 (Week 1): Pt will maintain dynamic sitting balance w/ supervision  Skilled Therapeutic Interventions/Progress Updates:    Pt seated in w/c upon PT arrival, agreeable to therapy tx and no evidence of pain. Session focused on attention to task and ambulation. Pt transported from room>dayroom in w/c total assist. Attempted to use kinetron for LE movement, pt able to participate x 30 sec before becoming distracted with talking. Pt transported to [facility] gym to use dynavision. Pt using dynavision to reach with L UE, requiring hand over hand assist to initiate movement and then able to find red lights on her own on her left side, unable to visually find red lights on R side. Pt transported from to gym total assist. Stand pivot transfer from w/c>mat with visual cues and gestures for sequencing. Pt able to perform sit<>stands x 3 from mat with therapist sitting in front of pt, facilitation weightbearing through R LE and anterior trunk lean. Pt seated edge of mat worked on attention to task reaching for bean bags with L UE and tossing into buck, visual cues and gestures used to keep pts attention on task. Pt ambulated x 100 ft from gym with +2 assist, seated rest break and then ambulated x 120 ft back to room and into bathroom, max visual and tactile cues to turn to sit on toilet. Pt with soiled brief, continent of bowel and bladder once seated on toilet. Pt ambulated out of bathroom to w/c +2 assist  and left seated in w/c with family present and QRB in place.   Therapy Documentation Precautions:  Precautions Precautions: Fall Restrictions Weight Bearing Restrictions: No   See Function Navigator for Current Functional Status.   Therapy/Group: Individual Therapy  Netta Corrigan, PT, DPT 02/20/2018, 8:02 AM

## 2018-02-20 NOTE — Progress Notes (Signed)
STROKE TEAM PROGRESS NOTE   SUBJECTIVE (INTERVAL HISTORY) Her multiple family members are at the bedside. Patient is currently having a bath and her exam was deferred. but as per family she is doing better she's had no further seizures.EEG yesterday showed focal slowing but without definite epileptiform activity. CT scan of the brain also did not show any acute abnormality.  OBJECTIVE Temp:  [97.4 F (36.3 C)-97.5 F (36.4 C)] 97.5 F (36.4 C) (04/02 0525) Pulse Rate:  [72-73] 72 (04/02 0525) Resp:  [17] 17 (04/01 1347) BP: (111-159)/(77-95) 149/81 (04/02 0927) SpO2:  [100 %] 100 % (04/02 0525)  CBC:  Recent Labs  Lab 02/15/18 1036 02/19/18 0601 02/20/18 0440  WBC 8.2 9.3 9.6  NEUTROABS 6.2  --  6.5  HGB 13.9 15.3* 13.7  HCT 41.2 44.4 40.4  MCV 94.1 95.1 95.1  PLT 164 208 891    Basic Metabolic Panel:  Recent Labs  Lab 02/19/18 0601 02/19/18 1716 02/20/18 0440  NA 134* 134* 136  K 5.7* 5.2* 4.7  CL 101 102 104  CO2 24 23 23   GLUCOSE 220* 210* 193*  BUN 24* 25* 22*  CREATININE 1.37* 1.27* 1.20*  CALCIUM 9.5 9.0 8.7*  MG 2.7*  --   --     Lipid Panel:     Component Value Date/Time   CHOL 193 02/10/2018 0428   TRIG 125 02/10/2018 0428   HDL 37 (L) 02/10/2018 0428   CHOLHDL 5.2 02/10/2018 0428   VLDL 25 02/10/2018 0428   LDLCALC 131 (H) 02/10/2018 0428   HgbA1c:  Lab Results  Component Value Date   HGBA1C 8.6 (H) 02/10/2018   Urine Drug Screen:     Component Value Date/Time   LABOPIA NONE DETECTED 02/09/2018 1433   COCAINSCRNUR NONE DETECTED 02/09/2018 1433   LABBENZ NONE DETECTED 02/09/2018 1433   AMPHETMU NONE DETECTED 02/09/2018 1433   THCU NONE DETECTED 02/09/2018 1433   LABBARB NONE DETECTED 02/09/2018 1433    Alcohol Level     Component Value Date/Time   ETH <10 02/09/2018 1306    IMAGING  Ct Head Wo Contrast 02/09/2018 IMPRESSION:  No acute abnormality. Atrophy, chronic microvascular ischemic change remote bilateral frontal infarcts.  Atherosclerosis. Chronic left sphenoid sinus disease.   Ct Angio Head W Or Wo Contrast Ct Angio Neck W Or Wo Contrast 02/10/2018 IMPRESSION:  1. Negative CTA for large vessel occlusion.  2. Atheromatous stenoses of approximately 60% about the carotid bifurcations/proximal ICAs bilaterally.  3. Attenuation of the distal left MCA branches, most likely related to chronic left MCA territory infarct.  4. Moderate to severe left V1 and V2 stenoses as above.  5. Advanced intracranial atherosclerotic disease throughout the remaining intracranial circulation, most notable within the carotid siphons, right worse than left.   Dg Chest 2 View 02/09/2018 IMPRESSION:  No active cardiopulmonary disease.   LE venous doppler no DVT  EEG today 02/19/18 shows moderate diffuse slowing at times triphasic appearance. Occasional focal slowing over the left temporal region.  The previous EEG on 02/10/17 was also abnormal with   intermittent frontal slowing (FIRDA).   FIRDA is a non-specific finding that can be seen with toxic, metabolic, diffuse, or multifocal structural processes.  No definite epileptiform changes were noted.   A single EEG without epileptiform changes does not exclude the diagnosis of epilepsy. Clinical correlation advised.  Transthoracic Echocardiogram - Left ventricle: Septal and apical akinesis inferior wall hypokinesis. The cavity size was severely dilated. Wall thickness was normal. The estimated  ejection fraction was 25%. Doppler parameters are consistent with both elevated ventricular end-diastolic filling pressure and elevated left atrial filling pressure. - Aortic valve: Given morphology and decreased EF AS likely more in moderae range despite low gradient. There was mild stenosis. There was mild regurgitation. - Mitral valve: Calcified annulus. Mildly thickened leaflets . - Atrial septum: No defect or patent foramen ovale was identified.   PHYSICAL EXAM General - Well nourished, well  developed, in no apparent distress.  Cardiovascular - Regular rate and rhythm.  Neuro - lethargic post ictal likely. Awakens to stimulation. follows simple commands inconsistently. Still with word salad and expressive aphasia but able to say "take that away from me" spontaneously, not able to name or repeat.  Left gaze preference, barely cross midline, right neglect, not blinking to visual threat on the right but does on the Left.  Right facial droop, tongue midline, right upper extremity and lower extremity 4/5 with drift, RUE and RLE normal strength without drift. Able to hold a cup in her hand to her mouth on the Left. Sensation grossly intact.    ASSESSMENT/PLAN [name] is a 83 y.o. female with history of coronary artery disease, hypothyroidism, hyperlipidemia, renal artery stenosis, previous DVT, history of breast cancer, diabetes mellitus, dementia, previous strokes, chronic kidney disease, and cardiomyopathy presenting with aphasia and right-sided weakness. She did not receive IV t-PA due to late presentation.  Stroke - left MCA small frontoparietal infarct - embolic pattern, likely due to low EF. Undiagnosed afib not excluded.   Resultant global aphasia, right hemiparesis, right facial droop, right neglect, left gaze  CT head - No acute abnormality. Remote bilateral frontal infarcts  MRI head - not cooperative  CT repeat - left frontoparietal small infarcts  EEG - FIRDA but no seizure  CTA H&N - bilateral ICA proximal 60% stenosis, left M2 stenosis  2D Echo - EF 25%, down from 40-45% in the past  LE venous Doppler negative for DVT  LDL - 131  HgbA1c - 8.6  VTE prophylaxis - SCDs Diet Carb Modified Fluid consistency: Thin; Room service appropriate? Yes  aspirin 81 mg daily and clopidogrel 75 mg daily prior to admission, now on aspirin 325 mg daily.   Ongoing aggressive stroke risk factor management  Therapy recommendations:  CIR  Disposition:   CLR  Cardiomyopathy with low EF  TTE showed EF 25%  08/2014 - EF 40-45%  Follows with Dr. Ellyn Hack in cardiology  Likely the source of embolic stroke  Cardiology on board, on lisinopril    Continue eliquis History of TIA/stroke  TIA in 04/2009 and 06/2009  1988 CVA- right eye vision difficulty  2011 left frontal infarct  04/2011 -right-sided weakness and aphasia - CT showed old left frontal infarct - MRI showed a left PLIC/thalamic infarct  Baseline mild dementia  Short-term memory deficit  But able to perform all ADLs  Still driving locally   Able to carry normal conversation  Need outpt follow up with neurology for monitoring  She has presented today with 2 episodes of focal seizures likely secondary to irritability from a recent left MCA infarct. CT scan shows no acute findings. Recommend IV Keppra 1 g loading dose followed by 250 mg twice daily. Continue eliquis for stroke prevention and aggressive risk factor modification. Long discussion at the bedside with the patient and multiple family members and answered questions. Discussed with rehabilitation teampractitioner.  .     Neurology will sign off. Please call with questions. Pt will  follow up with stroke clinic at [facility] in about 4-6 weeks. Thanks for the consult. [place] for Pager information 02/20/2018 1:10 PM    .      To contact Stroke Continuity provider, please refer to [email or web address] After hours, contact General Neurology

## 2018-02-21 ENCOUNTER — Inpatient Hospital Stay (HOSPITAL_COMMUNITY): Payer: Medicare Other

## 2018-02-21 ENCOUNTER — Inpatient Hospital Stay (HOSPITAL_COMMUNITY): Payer: Medicare Other | Admitting: Speech Pathology

## 2018-02-21 ENCOUNTER — Inpatient Hospital Stay (HOSPITAL_COMMUNITY): Payer: Medicare Other | Admitting: Occupational Therapy

## 2018-02-21 LAB — GLUCOSE, CAPILLARY
Glucose-Capillary: 214 mg/dL — ABNORMAL HIGH (ref 65–99)
Glucose-Capillary: 195 mg/dL — ABNORMAL HIGH (ref 65–99)
Glucose-Capillary: 153 mg/dL — ABNORMAL HIGH (ref 65–99)
Glucose-Capillary: 159 mg/dL — ABNORMAL HIGH (ref 65–99)

## 2018-02-21 MED ORDER — INSULIN GLARGINE 100 UNIT/ML ~~LOC~~ SOLN
30.0000 [IU] | Freq: Every day | SUBCUTANEOUS | Status: DC
  Administered 2018-02-22 – 2018-02-26 (×5): 30 [IU] via SUBCUTANEOUS
  Filled 2018-02-21 (×6): qty 0.3

## 2018-02-21 NOTE — Progress Notes (Signed)
Occupational Therapy Weekly Progress Note  Patient Details  Name: [name] MRN: [id number] Date of Birth: 10-Dec-1934  Beginning of progress report period: February 13, 2018 End of progress report period: February 21, 2018  Today's Date: 02/21/2018 OT Individual Time: 4356-8616 OT Individual Time Calculation (min): 18 min   Patient has met 0 of 3 short term goals.  Pt is making slow progress towards OT goals at this time. Pt has had noted decline since seizure activity and has been very inconsistent. Pt has been limited by fatigue and needs mostly max A for her BADL tasks 2/2  R UE apattention, initiation, alien arm syndrome, and apraxia.  Patient continues to demonstrate the following deficits: muscle weakness, impaired timing and sequencing, abnormal tone, unbalanced muscle activation, motor apraxia, ataxia, decreased coordination and decreased motor planning, decreased midline orientation, decreased attention to right, right side neglect and decreased motor planning, decreased initiation, decreased attention, decreased awareness, decreased problem solving, decreased safety awareness, decreased memory and delayed processing and decreased sitting balance, decreased standing balance, decreased postural control, hemiplegia, decreased balance strategies and difficulty maintaining precautions and therefore will continue to benefit from skilled OT intervention to enhance overall performance with BADL and Reduce care partner burden.  Patient not progressing toward long term goals.  See goal revision..  Plan of care revisions: Min/Mod A overall.  OT Short Term Goals Week 1:  OT Short Term Goal 1 (Week 1): Pt will safely transfer from w/c to toilet with min A  OT Short Term Goal 1 - Progress (Week 1): Progressing toward goal OT Short Term Goal 2 (Week 1): Pt will complete oral hygiene with min multimodal cues OT Short Term Goal 2 - Progress (Week 1): Progressing toward goal OT Short Term Goal 3 (Week 1):  Pt will complete seated level LB dressing with stand by assist OT Short Term Goal 3 - Progress (Week 1): Progressing toward goal Week 2:  OT Short Term Goal 1 (Week 2): Pt will safely transfer from w/c to toilet with consistent mod A OT Short Term Goal 2 (Week 2): Pt will complete oral hygiene with mod multimodal cues OT Short Term Goal 3 (Week 2): Pt will complete seated level LB dressing with mod  Skilled Therapeutic Interventions/Progress Updates:    Pt very lethargic sitting in wc upon OT arrival with daughter-in-law present. Pt noted to not be sitting on cushion in wc, so OT got new cushion. Max/total A sit<>stand from wc while daughter in-law placed wc cushion. Pt closed eyes again after standing and would not wake up to participate further in therapy. Pt left seated in wc with safety belt on, R UE propped on pillow, and family present.   Therapy Documentation Precautions:  Precautions Precautions: Fall Restrictions Weight Bearing Restrictions: No General: General PT Missed Treatment Reason: Patient fatigue Pain: Pain Assessment Pain Score: 0-No pain  See Function Navigator for Current Functional Status.   Therapy/Group: Individual Therapy  Valma Cava 02/21/2018, 12:02 PM

## 2018-02-21 NOTE — Patient Care Conference (Signed)
Inpatient RehabilitationTeam Conference and Plan of Care Update Date: 02/21/2018   Time: 11:25 AM    Patient Name: [name]      Medical Record Number: [id number]  Date of Birth: 1935/03/05 Sex: Female         Room/Bed: 4W18C/4W18C-01 Payor Info: Payor: [employer] MEDICARE / Plan: [employer] MEDICARE / Product Type: *No Product type* /    Admitting Diagnosis: Stroke  Admit Date/Time:  02/13/2018  4:57 PM Admission Comments: No comment available   Primary Diagnosis:  <principal problem not specified> Principal Problem: <principal problem not specified>  Patient Active Problem List   Diagnosis Date Noted  . Seizures ([place])   . Acute ischemic left middle cerebral artery (MCA) stroke ([place]) 02/13/2018  . DNR (do not resuscitate) discussion   . Palliative care by specialist   . Goals of care, counseling/discussion   . Sundowning   . Aphasia   . Coronary artery disease involving native coronary artery of native heart without angina pectoris   . History of CVA (cerebrovascular accident)   . Poorly controlled type 2 diabetes mellitus ([place])   . AKI (acute kidney injury) ([place])   . Agitation   . Expressive aphasia   . Stroke ([place]) 02/09/2018  . Ischemic cardiomyopathy 06/27/2014  . Weakness 06/11/2014  . CAD S/P DES to Prox Cx & SVG-D1; PTCA of Anastomotic SVG-D1 lesion 06/05/2014  . NSTEMI (non-ST elevated myocardial infarction) ([place]) 06/04/2014  . Gout 03/13/2012  . History of breast cancer   . Chronic combined systolic and diastolic congestive heart failure, NYHA class 2 ([place]) 03/12/2012  . 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 Renal artery stenosis ([place]) 03/12/2012  . Essential hypertension 03/12/2012  . CVA (cerebral vascular accident) ([place]) 03/12/2012  . DM (diabetes mellitus), type 2, uncontrolled with complications ([place]) 25/36/6440  . Chronic kidney disease (CKD), stage II (mild) 03/12/2012  .  Hypothyroidism- (TSH low, Synthroid held) 03/12/2012  . Hyperlipidemia with target LDL less than 70; and statin intolerant 03/12/2012  . Bradycardia 03/12/2012  . Syncope 03/12/2012  . History of DVT (deep vein thrombosis) 03/12/2012  . DJD (degenerative joint disease) of knee   . Hx of adenomatous colonic polyps     Expected Discharge Date: Expected Discharge Date: 02/28/18  Team Members Present: Physician leading conference: Dr. Alysia Penna Social Worker Present: Ovidio Kin, LCSW Nurse Present: Blair Heys, RN PT Present: Michaelene Song, PT OT Present: Cherylynn Ridges, OT SLP Present: Weston Anna, SLP PPS Coordinator present : Daiva Nakayama, RN, CRRN     Current Status/Progress Goal Weekly Team Focus  Medical   Severe sensory deficits, seizure disorder post stroke, globally aphasia  Reduce risk of recurrent seizures  Change Depakote from IV to p.o., discussed with neurology   Bowel/Bladder   Incontinent of B/B  Regain continence  Timed Toileting   Swallow/Nutrition/ Hydration             ADL's   Mod/Max A overall, R UE apraxis and motor planning, very inconsistent   Min A/supervision, lofty  ADL retraining, pt/family education, NMR, attention, initiation, safety awareness   Mobility   mod assist bed mobility, mod assist stand pivot, +2 for gait up to 150 ft with L HHA and 2nd person on R side  supervision  functional mobility, awareness, gait, safety   Communication   Max-Total A  Mod A  multimodal communication, following commands    Safety/Cognition/ Behavioral Observations  Max-Total A   Mod A  functional problem solving, attention, awareness    Pain   No evidence of pain.  Pain < 2  Assess Qshift and PRN   Skin   Skin intact  Prevent infection/breakdown         *See Care Plan and progress notes for long and short-term goals.     Barriers to Discharge  Current Status/Progress Possible Resolutions Date Resolved   Physician    Medical stability      Minimal progress  May need to consider SNF option      Nursing                  PT  Home environment access/layout;Behavior                 OT                  SLP                SW                Discharge Planning/Teaching Needs:  Family aware pt will need 24 hr care upon discharge but still asking if she will recover from this      Team Discussion:  Goals may be downgraded due to lack of progress I therapies. Language with contextual cues. Po good. Keppra regulating her seizures. Incontinent with B & B timed toileting. Max currently. Will need to begin family training end of week or beginning next week. Family aware will need 24 hr care.  Revisions to Treatment Plan:  DC 4/10    Continued Need for Acute Rehabilitation Level of Care: The patient requires daily medical management by a physician with specialized training in physical medicine and rehabilitation for the following conditions: Daily direction of a multidisciplinary physical rehabilitation program to ensure safe treatment while eliciting the highest outcome that is of practical value to the patient.: Yes Daily medical management of patient stability for increased activity during participation in an intensive rehabilitation regime.: Yes Daily analysis of laboratory values and/or radiology reports with any subsequent need for medication adjustment of medical intervention for : Neurological problems;Blood pressure problems;Mood/behavior problems  Reika Callanan, Gardiner Rhyme 02/21/2018, 1:31 PM

## 2018-02-21 NOTE — Progress Notes (Signed)
Speech Language Pathology Weekly Progress Note  Patient Details  Name: WINDY DUDEK MRN: [id number] Date of Birth: February 14, 1935  Beginning of progress report period: February 14, 2018 End of progress report period: February 21, 2018    Short Term Goals: Week 1: SLP Short Term Goal 1 (Week 1): Patient will identify functional items from a field of 2 with 50% accuracy with Max A multimodal cues.  SLP Short Term Goal 1 - Progress (Week 1): Not met SLP Short Term Goal 2 (Week 1): Patient will answer basic yes/no questions in regards to biographical information with 50% accuracy and Max A multimodal cues.  SLP Short Term Goal 2 - Progress (Week 1): Not met SLP Short Term Goal 3 (Week 1): Patient will follow basic 1-step directions during functional tasks with Max A multimodal cues.  SLP Short Term Goal 3 - Progress (Week 1): Not met SLP Short Term Goal 4 (Week 1): Patient will complete basic and functional problem solving tasks with Max A multimodal cues.  SLP Short Term Goal 4 - Progress (Week 1): Not met SLP Short Term Goal 5 (Week 1): Patient will demonstrate sustained attention for 5-minute intervals during functional tasks with Max A verbal cues.  SLP Short Term Goal 5 - Progress (Week 1): Not met SLP Short Term Goal 6 (Week 1): Patient will self-monitor verbal errors x2 within a 45-minute session with Max A multimodal cues.  SLP Short Term Goal 6 - Progress (Week 1): Not met    New Short Term Goals: Week 2: SLP Short Term Goal 1 (Week 2): Patient will identify functional items from a field of 2 with 50% accuracy with Max A multimodal cues.  SLP Short Term Goal 2 (Week 2): Patient will answer basic yes/no questions in regards to biographical information with 50% accuracy and Max A multimodal cues.  SLP Short Term Goal 3 (Week 2): Patient will follow basic 1-step directions during functional tasks with Max A multimodal cues.  SLP Short Term Goal 4 (Week 2): Patient will complete basic and  functional problem solving tasks with Max A multimodal cues.  SLP Short Term Goal 5 (Week 2): Patient will demonstrate sustained attention for 5-minute intervals during functional tasks with Max A verbal cues.  SLP Short Term Goal 6 (Week 2): Patient will self-monitor verbal errors x2 within a 45-minute session with Max A multimodal cues.    Weekly Progress Updates: Patient has made minimal gains and has not met any STG's consistently this reporting period. Patient's progress has been limited by decreased arousal and fatigue since new onset of seizures. Currently, patient requires overall Max-Total A for functional communication, but demonstrates increased use of gestures, body language, and facial expressions to purposefully communicate basic wants/needs. Patient continues to demonstrate fluent jargon; however, is able to produce increased instances of appropriate language and social responses during basic conversations. Patient continues to require Total A verbal cues for awareness of verbal errors during basic communication. Within a familiar context, patient demonstrates increased auditory comprehension and is able to follow basic directions with Max A verbal cues. Patient also demonstrates basic problem solving and sustained attention with Max-Total A verbal cues with familiar tasks. Patient and family education ongoing. Patient would benefit from continued skilled SLP intervention to maximize her cognitive-linguistic function prior to discharge.      Intensity: Minumum of 1-2 x/day, 30 to 90 minutes Frequency: 3 to 5 out of 7 days Duration/Length of Stay: 02/28/2018 Treatment/Interventions: Cognitive remediation/compensation;Environmental controls;Multimodal communication approach;Therapeutic Activities;Functional tasks;Cueing  hierarchy;Internal/external aids;Patient/family education;Speech/Language facilitation       Meredeth Ide  SLP - Student 02/21/2018, 9:37 AM

## 2018-02-21 NOTE — Progress Notes (Signed)
SLP Cancellation Note  Patient Details Name: [name] MRN: [id number] DOB: 03-25-1935   Cancelled treatment:        Attempted to see pt for scheduled make up therapy session.  Pt received with PT present.  PT reports minimal participation in therapy session and that pt kept her eyes closed for the majority of treatment.  Pt would briefly respond to name and auditory/tactile stimulation but would not awaken sufficiently to meaningfully participate in therapies.  As a result, SLP assisted PT in getting pt back to bed and pt was left with family at bedside.  Will follow up at next available appointment.                                                                                                  Kamoni Gentles, Selinda Orion 02/21/2018, 3:28 PM

## 2018-02-21 NOTE — Progress Notes (Signed)
Occupational Therapy Session Note  Patient Details  Name: [name] MRN: [id number] Date of Birth: 09-30-1935  Today's Date: 02/21/2018 OT Individual Time: 1300-1330 OT Individual Time Calculation (min): 30 min    Short Term Goals: Week 2:  OT Short Term Goal 1 (Week 2): Pt will safely transfer from w/c to toilet with consistent mod A OT Short Term Goal 2 (Week 2): Pt will complete oral hygiene with mod multimodal cues OT Short Term Goal 3 (Week 2): Pt will complete seated level LB dressing with mod  Skilled Therapeutic Interventions/Progress Updates:    Pt received sitting up in w/c in room with family present. Multimodal cueing used to attempt and increase pt arousal d/t extreme fatigue. Pt presented with washcloth and attempted to engage pt in ADL task, with pt attempting to initiate participation but then refusing. Pt transferred back to bed via squat pivot and max A, with pt attempting to participate in sit to stand. Pt required max A for brief change and peri-hygiene at bed level. Further therapy deferred today d/t pt arousal. Discussion with family re reorientating pt throughout the day and the importance of sleep schedules. Pt left supine in bed with bed alarm on.   Therapy Documentation Precautions:  Precautions Precautions: Fall Restrictions Weight Bearing Restrictions: No General: General PT Missed Treatment Reason: Patient fatigue  Pain: Pain Assessment Pain Score: 0-No pain Faces Pain Scale: No hurt  See Function Navigator for Current Functional Status.   Therapy/Group: Individual Therapy  Curtis Sites 02/21/2018, 1:38 PM

## 2018-02-21 NOTE — Progress Notes (Signed)
Social Work Patient ID: [name], female   DOB: Apr 25, 1935, 83 y.o.   MRN: [id number]  Spoke with [name]-daughter via telephone to discuss team conference goals mi-mod level and discharge 4/10. Discussed she will need 24 hr hands on care and we will need [employer] training end of this week or early next week with them. She is meeting with [employer] people tomorrow for signing up for their services, she is aware her Mom will have to have active Medicaid to be eligible for this program. She will let me know about family training and will work on discharge needs.

## 2018-02-21 NOTE — Progress Notes (Signed)
Patient has been very sleepy today,did not eat much either,vitals are stable,Dr was informed about patient status,continue to monitor her .

## 2018-02-21 NOTE — Progress Notes (Signed)
Physical Therapy Weekly Progress Note  Patient Details  Name: [name] MRN: [id number] Date of Birth: December 17, 1934  Beginning of progress report period: February 14, 2018 End of progress report period: February 21, 2018  Today's Date: 02/21/2018 PT Individual Time: 0900-0915, 1430-1500 PT Individual Time Calculation (min): 15 min and 30 min and Today's Date: 02/21/2018 PT Missed Time: 45 Minutes Missed Time Reason: Patient fatigue  Patient has met 0 of 4 short term goals.  Pt is not progressing towards long term goals thus goals have been down graded to min-mod assist overall. Pt's participation in therapy is limited secondary to communication and cognitive deficits along with altered sleep-wake cycle. When actively participating in therapy pt can be a min assist level for bed mobility and mod assist for transfers. Pt requires +2 assist for ambulation secondary to safety and poor awareness.   Patient continues to demonstrate the following deficits impaired timing and sequencing, decreased coordination and decreased motor planning, decreased attention to right, decreased attention, decreased safety awareness and decreased memory and decreased sitting balance, decreased standing balance, decreased postural control, hemiplegia and decreased balance strategies and therefore will continue to benefit from skilled PT intervention to increase functional independence with mobility.  Patient not progressing toward long term goals.  See goal revision..  Plan of care revisions: pt's goals have been downgraded to min assist for bed mobility and transfers, mod assist for short distance gait. [name]  PT Short Term Goals Week 1:  PT Short Term Goal 1 (Week 1): Pt will transfer bed<>chair w/ min assist PT Short Term Goal 1 - Progress (Week 1): Not met PT Short Term Goal 2 (Week 1): Pt will ambulate 50' w/ LRAD w/ min assist PT Short Term Goal 2 - Progress (Week 1): Not met PT Short Term Goal 3 (Week 1): Pt will follow  simple, 1-step commands during functional mobility 100% of the time PT Short Term Goal 3 - Progress (Week 1): Not met PT Short Term Goal 4 (Week 1): Pt will maintain dynamic sitting balance w/ supervision PT Short Term Goal 4 - Progress (Week 1): Progressing toward goal Week 2:  PT Short Term Goal 1 (Week 2): STG=LTG due to ELOS  Skilled Therapeutic Interventions/Progress Updates:   Session 1: Pt supine in bed upon PT arrival, asleep and difficult to arouse. Therapist transferred from supine>sitting EOB with max assist in attempts to arouse pt. Pt opened eyes and then immediately closed again, therapist providing max assist for pts sitting balance. Pt not arousing to tactile or verbal stimuli, therapist transferred back to supine max assist and boosted up in bed. Left supine in bed with bed alarm set, RN reports pt did not fall asleep until early this AM and agrees to let pt rest.  Pt missed 45 minutes of skilled therapy tx, will follow up later this afternoon.   Session 2: Pt seen for make up time. Pt supine in bed upon PT arrival, asleep and difficult to arouse. Therapist transferred pt from supine>sitting max assist. Pt beginning to vocalize but does no open eye. RN applies wet wash cloth to face, does not open eyes. Pt performed sit<>stand x 2 with max assist, actively participating in movement but does not open eyes. Pt transferred from bed>w/c stand pivot max assist. Pt given cup, grasps with L hand but does not bring to mouth to drink. Pt given comb, grasps with L hand a brings to hair. Pt continues to demonstrate fatigue and does not fully arouse. Pt  transferred back to bed stand pivot max assist, left supine in bed with needs in reach and family present.   Therapy Documentation Precautions:  Precautions Precautions: Fall Restrictions Weight Bearing Restrictions: No   See Function Navigator for Current Functional Status.  Therapy/Group: Individual Therapy  Netta Corrigan, PT,  DPT 02/21/2018, 7:50 AM

## 2018-02-21 NOTE — Progress Notes (Signed)
Subjective/Complaints:   Sleeping  In bed, awakens to physical stim , not voice    ROS- cannot obtain due to aphasia Objective: Vital Signs: Blood pressure 137/68, pulse 62, temperature (!) 97.3 F (36.3 C), temperature source Axillary, resp. rate 16, height 5' 3"  (1.6 m), weight 60.1 kg (132 lb 7.9 oz), SpO2 100 %. Dg Chest 1 View  Result Date: 02/19/2018 CLINICAL DATA:  Recent seizure activity EXAM: CHEST  1 VIEW COMPARISON:  02/15/2018 FINDINGS: Cardiac shadow is stable. Postoperative changes are again seen. The lungs are well aerated bilaterally. No focal infiltrate or sizable effusion is seen. No bony abnormality is noted. IMPRESSION: No active disease. Electronically Signed   By: Inez Catalina M.D.   On: 02/19/2018 12:42   Ct Head Wo Contrast  Result Date: 02/19/2018 CLINICAL DATA:  Follow up stroke Patient who had seizures this am EXAM: CT HEAD WITHOUT CONTRAST TECHNIQUE: Contiguous axial images were obtained from the base of the skull through the vertex without intravenous contrast. COMPARISON:  02/11/2018 FINDINGS: Brain: The left posterior MCA distribution infarct has evolved since the prior exam. There is now well-defined hypoattenuation that extends from the inferior left parietal lobe to the posterior left temporal lobe. There is mild associated mass effect with mild compression of the atrium and posterior body of the left lateral ventricle. There are no new areas of infarction. Small old areas of infarction are noted along both posterior frontal lobes. Old right deep white matter lacune infarct just above the basal ganglia. Patchy areas of white matter hypoattenuation are noted consistent with mild chronic microvascular ischemic change. The ventricles are normal in size for this patient's age. There are no parenchymal or extra-axial masses. No intracranial hemorrhage. Vascular: No hyperdense vessel or unexpected calcification. Skull: Normal. Negative for fracture or focal lesion.  Sinuses/Orbits: Globes and orbits are unremarkable. The left sphenoid sinus is mostly opacified with chronic mucosal thickening and secretions, stable from the prior study. Remaining sinuses are clear. Clear mastoid air cells. Other: None. IMPRESSION: 1. Normal expected evolution of the left posterior MCA distribution infarct since the prior study. There are no new areas of infarction and there is no intracranial hemorrhage. No hydrocephalus. Electronically Signed   By: Lajean Manes M.D.   On: 02/19/2018 10:49   Results for orders placed or performed during the hospital encounter of 02/13/18 (from the past 72 hour(s))  Glucose, capillary     Status: Abnormal   Collection Time: 02/18/18 11:24 AM  Result Value Ref Range   Glucose-Capillary 368 (H) 65 - 99 mg/dL  Glucose, capillary     Status: Abnormal   Collection Time: 02/18/18  4:56 PM  Result Value Ref Range   Glucose-Capillary 457 (H) 65 - 99 mg/dL  Glucose, capillary     Status: Abnormal   Collection Time: 02/18/18  6:33 PM  Result Value Ref Range   Glucose-Capillary 481 (H) 65 - 99 mg/dL  Glucose, capillary     Status: Abnormal   Collection Time: 02/18/18  9:00 PM  Result Value Ref Range   Glucose-Capillary 331 (H) 65 - 99 mg/dL  Basic metabolic panel     Status: Abnormal   Collection Time: 02/19/18  6:01 AM  Result Value Ref Range   Sodium 134 (L) 135 - 145 mmol/L   Potassium 5.7 (H) 3.5 - 5.1 mmol/L   Chloride 101 101 - 111 mmol/L   CO2 24 22 - 32 mmol/L   Glucose, Bld 220 (H) 65 - 99 mg/dL  BUN 24 (H) 6 - 20 mg/dL   Creatinine, Ser 1.37 (H) 0.44 - 1.00 mg/dL   Calcium 9.5 8.9 - 10.3 mg/dL   GFR calc non Af Amer 35 (L) >60 mL/min   GFR calc Af Amer 41 (L) >60 mL/min    Comment: (NOTE) The eGFR has been calculated using the CKD EPI equation. This calculation has not been validated in all clinical situations. eGFR's persistently <60 mL/min signify possible Chronic Kidney Disease.    Anion gap 9 5 - 15    Comment:  Performed at [facility] [address], [place], [place] [address]  CBC     Status: Abnormal   Collection Time: 02/19/18  6:01 AM  Result Value Ref Range   WBC 9.3 4.0 - 10.5 K/uL   RBC 4.67 3.87 - 5.11 MIL/uL   Hemoglobin 15.3 (H) 12.0 - 15.0 g/dL   HCT 44.4 36.0 - 46.0 %   MCV 95.1 78.0 - 100.0 fL   MCH 32.8 26.0 - 34.0 pg   MCHC 34.5 30.0 - 36.0 g/dL   RDW 13.1 11.5 - 15.5 %   Platelets 208 150 - 400 K/uL    Comment: Performed at [facility], [place] [address], [place], [place] [address]  Magnesium     Status: Abnormal   Collection Time: 02/19/18  6:01 AM  Result Value Ref Range   Magnesium 2.7 (H) 1.7 - 2.4 mg/dL    Comment: Performed at [facility] [address] [place], [place] [address]  Glucose, capillary     Status: Abnormal   Collection Time: 02/19/18  6:33 AM  Result Value Ref Range   Glucose-Capillary 217 (H) 65 - 99 mg/dL  Glucose, capillary     Status: Abnormal   Collection Time: 02/19/18  8:14 AM  Result Value Ref Range   Glucose-Capillary 244 (H) 65 - 99 mg/dL  Glucose, capillary     Status: Abnormal   Collection Time: 02/19/18  9:55 AM  Result Value Ref Range   Glucose-Capillary 288 (H) 65 - 99 mg/dL  Glucose, capillary     Status: Abnormal   Collection Time: 02/19/18 11:35 AM  Result Value Ref Range   Glucose-Capillary 282 (H) 65 - 99 mg/dL  Glucose, capillary     Status: Abnormal   Collection Time: 02/19/18  4:38 PM  Result Value Ref Range   Glucose-Capillary 214 (H) 65 - 99 mg/dL  Basic metabolic panel     Status: Abnormal   Collection Time: 02/19/18  5:16 PM  Result Value Ref Range   Sodium 134 (L) 135 - 145 mmol/L   Potassium 5.2 (H) 3.5 - 5.1 mmol/L   Chloride 102 101 - 111 mmol/L   CO2 23 22 - 32 mmol/L   Glucose, Bld 210 (H) 65 - 99 mg/dL   BUN 25 (H) 6 - 20 mg/dL   Creatinine, Ser 1.27 (H) 0.44 - 1.00 mg/dL   Calcium 9.0 8.9 - 10.3 mg/dL   GFR calc non Af Amer 39 (L) >60 mL/min   GFR calc Af Amer 45 (L) >60 mL/min     Comment: (NOTE) The eGFR has been calculated using the CKD EPI equation. This calculation has not been validated in all clinical situations. eGFR's persistently <60 mL/min signify possible Chronic Kidney Disease.    Anion gap 9 5 - 15    Comment: Performed at [facility] [address] [place], [place] [address]  Glucose, capillary  Status: Abnormal   Collection Time: 02/19/18  8:40 PM  Result Value Ref Range   Glucose-Capillary 165 (H) 65 - 99 mg/dL  CBC with Differential/Platelet     Status: None   Collection Time: 02/20/18  4:40 AM  Result Value Ref Range   WBC 9.6 4.0 - 10.5 K/uL   RBC 4.25 3.87 - 5.11 MIL/uL   Hemoglobin 13.7 12.0 - 15.0 g/dL   HCT 40.4 36.0 - 46.0 %   MCV 95.1 78.0 - 100.0 fL   MCH 32.2 26.0 - 34.0 pg   MCHC 33.9 30.0 - 36.0 g/dL   RDW 12.9 11.5 - 15.5 %   Platelets 204 150 - 400 K/uL   Neutrophils Relative % 67 %   Neutro Abs 6.5 1.7 - 7.7 K/uL   Lymphocytes Relative 26 %   Lymphs Abs 2.5 0.7 - 4.0 K/uL   Monocytes Relative 6 %   Monocytes Absolute 0.6 0.1 - 1.0 K/uL   Eosinophils Relative 1 %   Eosinophils Absolute 0.1 0.0 - 0.7 K/uL   Basophils Relative 0 %   Basophils Absolute 0.0 0.0 - 0.1 K/uL    Comment: Performed at [facility], [address], [place], [place] [address]  Basic metabolic panel     Status: Abnormal   Collection Time: 02/20/18  4:40 AM  Result Value Ref Range   Sodium 136 135 - 145 mmol/L   Potassium 4.7 3.5 - 5.1 mmol/L   Chloride 104 101 - 111 mmol/L   CO2 23 22 - 32 mmol/L   Glucose, Bld 193 (H) 65 - 99 mg/dL   BUN 22 (H) 6 - 20 mg/dL   Creatinine, Ser 1.20 (H) 0.44 - 1.00 mg/dL   Calcium 8.7 (L) 8.9 - 10.3 mg/dL   GFR calc non Af Amer 42 (L) >60 mL/min   GFR calc Af Amer 48 (L) >60 mL/min    Comment: (NOTE) The eGFR has been calculated using the CKD EPI equation. This calculation has not been validated in all clinical situations. eGFR's persistently <60 mL/min signify possible Chronic  Kidney Disease.    Anion gap 9 5 - 15    Comment: Performed at [facility] [address], [place], [place] [address]  Glucose, capillary     Status: Abnormal   Collection Time: 02/20/18  6:28 AM  Result Value Ref Range   Glucose-Capillary 180 (H) 65 - 99 mg/dL  Glucose, capillary     Status: Abnormal   Collection Time: 02/20/18 11:30 AM  Result Value Ref Range   Glucose-Capillary 199 (H) 65 - 99 mg/dL  Glucose, capillary     Status: Abnormal   Collection Time: 02/20/18  4:54 PM  Result Value Ref Range   Glucose-Capillary 233 (H) 65 - 99 mg/dL  Glucose, capillary     Status: Abnormal   Collection Time: 02/20/18  8:39 PM  Result Value Ref Range   Glucose-Capillary 283 (H) 65 - 99 mg/dL  Glucose, capillary     Status: Abnormal   Collection Time: 02/21/18  6:47 AM  Result Value Ref Range   Glucose-Capillary 214 (H) 65 - 99 mg/dL     HEENT: normal Cardio: RRR and no murmur Resp: CTA B/L and no wheezes GI: BS positive and NT, ND Extremity:  No Edema Skin:   Bruise forearms Neuro: Abnormal Sensory reduced sensation to pinch Right Upper and Lower limb, Abnormal Motor 3/5 RUE and RLE, Abnormal FMC Ataxic/ dec FMC and Aphasic Musc/Skel:  Other no pain with UE or LE ROM Gen NAD   Assessment/Plan: 1. Functional deficits secondary to Left thalamic and Left parietal infarct which require 3+ hours per day of interdisciplinary therapy in a comprehensive inpatient rehab setting. Physiatrist is providing close team supervision and 24 hour management of active medical problems listed below. Physiatrist and rehab team continue to assess barriers to discharge/monitor patient progress toward functional and medical goals. FIM: Function - Bathing Position: Shower Body parts bathed by patient: Right arm, Chest, Left upper leg Body parts bathed by helper: Left arm, Abdomen, Front perineal area, Buttocks, Right upper leg, Right lower leg, Left lower leg, Back Bathing not applicable:  Buttocks Assist Level: Touching or steadying assistance(Pt > 75%)  Function- Upper Body Dressing/Undressing What is the patient wearing?: Pull over shirt/dress Pull over shirt/dress - Perfomed by patient: Thread/unthread right sleeve, Thread/unthread left sleeve, Put head through opening, Pull shirt over trunk Pull over shirt/dress - Perfomed by helper: Thread/unthread right sleeve, Thread/unthread left sleeve, Pull shirt over trunk, Put head through opening Assist Level: Touching or steadying assistance(Pt > 75%) Function - Lower Body Dressing/Undressing What is the patient wearing?: Non-skid slipper socks Position: Wheelchair/chair at sink Non-skid slipper socks- Performed by patient: Don/doff right sock Non-skid slipper socks- Performed by helper: Don/doff right sock, Don/doff left sock Assist for footwear: Dependant Assist for lower body dressing: Touching or steadying assistance (Pt > 75%)  Function - Toileting Toileting activity did not occur: Safety/medical concerns Toileting steps completed by patient: Performs perineal hygiene Toileting steps completed by helper: Adjust clothing prior to toileting, Performs perineal hygiene, Adjust clothing after toileting Toileting Assistive Devices: Grab bar or rail Assist level: Touching or steadying assistance (Pt.75%)  Function - Air cabin crew transfer activity did not occur: Safety/medical concerns Toilet transfer assistive device: Grab bar Assist level to toilet: Maximal assist (Pt 25 - 49%/lift and lower) Assist level from toilet: Maximal assist (Pt 25 - 49%/lift and lower) Assist level to bedside commode (at bedside): 2 helpers Assist level from bedside commode (at bedside): 2 helpers  Function - Chair/bed transfer Chair/bed transfer method: Stand pivot Chair/bed transfer assist level: Moderate assist (Pt 50 - 74%/lift or lower) Chair/bed transfer assistive device: Armrests Chair/bed transfer details: Manual facilitation  for placement, Manual facilitation for weight shifting, Tactile cues for sequencing, Tactile cues for initiation, Tactile cues for placement  Function - Locomotion: Wheelchair Will patient use wheelchair at discharge?: No Assist Level: Dependent (Pt equals 0%)(for transport) Function - Locomotion: Ambulation Assistive device: Hand held assist Max distance: 100 ft Assist level: 2 helpers Assist level: 2 helpers Walk 50 feet with 2 turns activity did not occur: Safety/medical concerns Assist level: 2 helpers Walk 150 feet activity did not occur: Safety/medical concerns Walk 10 feet on uneven surfaces activity did not occur: Safety/medical concerns  Function - Comprehension Comprehension: Auditory Comprehension assist level: Understands basic less than 25% of the time/ requires cueing >75% of the time  Function - Expression Expression: Verbal Expression assist level: Expresses basis less than 25% of the time/requires cueing >75% of the time.  Function - Social Interaction Social Interaction assist level: Interacts appropriately less than 25% of the time. May be withdrawn or combative.  Function - Problem Solving Problem solving assist level: Solves basic less than 25% of the time - needs direction nearly all the time or does not effectively solve problems and may need a restraint for safety  Function - Memory Memory assist level: Recognizes or recalls less than 25% of the  time/requires cueing greater than 75% of the time Patient normally able to recall (first 3 days only): None of the above  Medical Problem List and Plan:  1. Functional deficits secondary to infarcts in left thalamus and left parietal periventricular white matter  -admit to inpatient rehab  PT, OT, SLP CIR level, Team conference today please see physician documentation under team conference tab, met with team face-to-face to discuss problems,progress, and goals. Formulized individual treatment plan based on medical  history, underlying problem and comorbidities. 2. DVT Prophylaxis/Anticoagulation: Pharmaceutical: Lovenox and Other (comment)  3. Pain Management: tylenol prn  4. Mood: Patient currently with poor awareness of deficits. LCSW to follow for evaluation when appropriate.  -pt has demonstrating increased confusion and agitation at night, poor sleep patterns  -check sleep chart  -begin scheduled, low dose seroquel  5. Neuropsych: This patient is not capable of making decisions on her own behalf.  6. Skin/Wound Care: routine pressure relief measures  7. Fluids/Electrolytes/Nutrition: encourage fluids, K is corrected 8. Chronic combined systolic/diastolic CHF/chemo induced CM: Heart healthy diet. Monitor for signs of overload and check weights daily. BB discontinued due to bradycardia. Continue Lisinopril daily. Used lasix prn edema at home.  Vitals:   02/20/18 1430 02/21/18 0056  BP: 114/71 137/68  Pulse: 80 62  Resp:  16  Temp: 98.7 F (37.1 C) (!) 97.3 F (36.3 C)  SpO2: (!) 89% 100%  BP controlled 4/3 9. CAD w/p PTCA: On ASA and Zetia.  10. CKD: Avoid nephrotoxic medications. Monitor with serial checks.  11.T2DM: Hgb A1C- 8.6. Blood sugars poorly controlled.--Used Lantus 80 units with metformin at nights. Increase Lantus to 15 units daily and titrate as needed. Monitor BS ac/hs.fair control 4/3, increase lantus to 30U CBG (last 3)  Recent Labs    02/20/18 1654 02/20/18 2039 02/21/18 0647  GLUCAP 233* 283* 214*  12. Hypothyroid: On supplement.   #13. 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

## 2018-02-21 NOTE — Progress Notes (Signed)
SLP Cancellation Note  Patient Details Name: [name] MRN: [id number] DOB: February 25, 1935   Cancelled treatment:     Patient missed 30 minutes of skilled SLP intervention due to fatigue and lethargy. Patient unable to maintain arousal long enough to participate in treatment session.  RN aware and reported that patient was awake the majority of the night. Will re-attempt as able.                                                                                                  Ysela Hettinger 02/21/2018, 10:35 AM

## 2018-02-21 NOTE — Plan of Care (Signed)
  Problem: Consults Goal: RH STROKE PATIENT EDUCATION Description See Patient Education module for education specifics  Outcome: Not Progressing   Problem: RH BOWEL ELIMINATION Goal: RH STG MANAGE BOWEL WITH ASSISTANCE Description STG Manage Bowel with mod I Assistance.  Outcome: Not Progressing Goal: RH STG MANAGE BOWEL W/MEDICATION W/ASSISTANCE Description STG Manage Bowel with Medication with mod I Assistance.  Outcome: Not Progressing   Problem: RH BLADDER ELIMINATION Goal: RH STG MANAGE BLADDER WITH ASSISTANCE Description STG Manage Bladder With  Min Assistance  Outcome: Not Progressing   Problem: RH SAFETY Goal: RH STG ADHERE TO SAFETY PRECAUTIONS W/ASSISTANCE/DEVICE Description STG Adhere to Safety Precautions With  Mod cues/reminders/ Assistance/Device.   Outcome: Not Progressing   Problem: RH PAIN MANAGEMENT Goal: RH STG PAIN MANAGED AT OR BELOW PT'S PAIN GOAL Description At or below level 4  Outcome: Not Progressing   Problem: RH KNOWLEDGE DEFICIT Goal: RH STG INCREASE KNOWLEDGE OF DIABETES Description Family will be able to verbalize understanding of diabetes management, CBG monitoring and medications indepently with cues/reminders/resources provided  Outcome: Not Progressing Goal: RH STG INCREASE KNOWLEDGE OF HYPERTENSION Description Family will be able to state and demo understanding of HTN, management, medications and dietary restrictions independendly using cues/resources and handouts provided  Outcome: Not Progressing

## 2018-02-22 ENCOUNTER — Inpatient Hospital Stay (HOSPITAL_COMMUNITY): Payer: Medicare Other | Admitting: Occupational Therapy

## 2018-02-22 ENCOUNTER — Inpatient Hospital Stay (HOSPITAL_COMMUNITY): Payer: Medicare Other | Admitting: Speech Pathology

## 2018-02-22 ENCOUNTER — Inpatient Hospital Stay (HOSPITAL_COMMUNITY): Payer: Medicare Other

## 2018-02-22 LAB — BASIC METABOLIC PANEL
Anion gap: 11 (ref 5–15)
BUN: 21 mg/dL — AB (ref 6–20)
CALCIUM: 9.3 mg/dL (ref 8.9–10.3)
CHLORIDE: 105 mmol/L (ref 101–111)
CO2: 20 mmol/L — AB (ref 22–32)
CREATININE: 1.17 mg/dL — AB (ref 0.44–1.00)
GFR calc non Af Amer: 43 mL/min — ABNORMAL LOW (ref >60)
GFR, EST AFRICAN AMERICAN: 50 mL/min — AB (ref >60)
GLUCOSE: 172 mg/dL — AB (ref 65–99)
Potassium: 4.4 mmol/L (ref 3.5–5.1)
Sodium: 136 mmol/L (ref 135–145)

## 2018-02-22 LAB — GLUCOSE, CAPILLARY
GLUCOSE-CAPILLARY: 222 mg/dL — AB (ref 65–99)
Glucose-Capillary: 173 mg/dL — ABNORMAL HIGH (ref 65–99)
Glucose-Capillary: 164 mg/dL — ABNORMAL HIGH (ref 65–99)
Glucose-Capillary: 153 mg/dL — ABNORMAL HIGH (ref 65–99)
Glucose-Capillary: 163 mg/dL — ABNORMAL HIGH (ref 65–99)
Glucose-Capillary: 175 mg/dL — ABNORMAL HIGH (ref 65–99)

## 2018-02-22 MED ORDER — LORAZEPAM 0.5 MG PO TABS
0.5000 mg | ORAL_TABLET | Freq: Every day | ORAL | Status: DC
  Administered 2018-02-22 – 2018-02-23 (×2): 0.5 mg via ORAL
  Filled 2018-02-22 (×2): qty 1

## 2018-02-22 MED ORDER — AMANTADINE HCL 50 MG/5ML PO SYRP
50.0000 mg | ORAL_SOLUTION | Freq: Two times a day (BID) | ORAL | Status: DC
  Administered 2018-02-22 – 2018-02-26 (×6): 50 mg via ORAL
  Filled 2018-02-22 (×9): qty 5

## 2018-02-22 NOTE — Plan of Care (Addendum)
  Problem: RH BLADDER ELIMINATION Goal: RH STG MANAGE BLADDER WITH ASSISTANCE Description STG Manage Bladder With  Min Assistance  Outcome: Not Progressing   Problem: RH BOWEL ELIMINATION Goal: RH STG MANAGE BOWEL WITH ASSISTANCE Description STG Manage Bowel with mod I Assistance.  Outcome: Not Progressing   Problem: RH SAFETY Goal: RH STG ADHERE TO SAFETY PRECAUTIONS W/ASSISTANCE/DEVICE Description STG Adhere to Safety Precautions With  Mod cues/reminders/ Assistance/Device.   Outcome: Not Progressing   Problem: RH KNOWLEDGE DEFICIT Goal: RH STG INCREASE KNOWLEDGE OF DIABETES Description Family will be able to verbalize understanding of diabetes management, CBG monitoring and medications indepently with cues/reminders/resources provided  Outcome: Not Progressing   Problem: RH KNOWLEDGE DEFICIT Goal: RH STG INCREASE KNOWLEDGE OF HYPERTENSION Description Family will be able to state and demo understanding of HTN, management, medications and dietary restrictions independendly using cues/resources and handouts provided  Outcome: Not Progressing  Patient is sleepy and unable eat and  do therapy. Pam PA notified

## 2018-02-22 NOTE — Progress Notes (Addendum)
Physical Therapy Session Note  Patient Details  Name: [name] MRN: [id number] Date of Birth: 11-May-1935  Today's Date: 02/22/2018 PT Individual Time: 0900-0940 AND 1430-1500 (make up time) PT Individual Time Calculation (min): 40 min  AND 30 min (make up time)  and Today's Date: 02/22/2018 PT Missed Time: 20 Minutes Missed Time Reason: Patient fatigue  Short Term Goals: Week 2:  PT Short Term Goal 1 (Week 2): STG=LTG due to ELOS  Skilled Therapeutic Interventions/Progress Updates:   Session 1:  Pt in supine and required max stimulation to wake up, continued to required mod-max verbal/manual/tactile cues to stay awake and participatory in session. Pt did not open her eyes during session. Appropriate verbal or non-verbal responses to questions and commands 3-4 times, otherwise vocalizing non-fluent jargon throughout. Session focused on increasing tolerance to upright/OOB activity and working on initiation of functional motor tasks. Transferred to EOB and maintained static sitting for 20+ minutes w/ mod-max assist overall while 2nd helper attempting to engage pt w/ eating breakfast and washing face. Pt did not initiate opening mouth when food was placed on lips, but did wash face when provided w/ wash cloth. Performed multiple sit<>stand transfers, total assist x2, in attempts to transfer via stand pivot to w/c, however pt unable to initiate pivoting and required total assist to maintain upright posture. She did perform ~25% effort to boost into standing once provided w/ max initiation cues. Verbal and manual cues for anterior weight shifting, initiation of transfer, R foot placement, and posture. Transferred to recliner via squat pivot total assist x2. Total assist to reposition in recliner for comfort and safety. Took pt out of room in attempts to increase arousal, pt continued to keep eyes close and did not respond to attempts to take a drink of orange juice. Returned to room and ended session  in recliner, call bell within reach. Quick release belt engaged and chair alarm on. NT made aware of pt's status. Missed 20 min of skilled PT 2/2 fatigue.   Session 2: Pt seen for skilled PT make up time as she was awake and alert. Focused on overall arousal, attention to task, and functional tasks including eating and donning new clothing. Pt participating in eating lunch that family member brought in w/ max verbal and tactile cues for initiation, attention to task, and reminders to chew. Pt verbose w/ non-fluent jargon throughout session and had eyes open entire time while eating, no pocketing of food noted between bites and small sips of water in between bites. Max assist x1 to stand while 2nd helper assisted w/ bringing pants up over hips, max assist overall to thread pants and shirt. Pt able to initiate putting on pants and shirt w/o cues, but required assistance to complete task. Pt w/ decreased arousal towards end of session and keeping eyes closed, deferred continuing to eat lunch and educated family member on importance of being awake and alert in order to safely eat. Ended session in recliner and in care of RN, all needs met.   Therapy Documentation Precautions:  Precautions Precautions: Fall Restrictions Weight Bearing Restrictions: No General: PT Amount of Missed Time (min): 20 Minutes PT Missed Treatment Reason: Patient fatigue Vital Signs: Therapy Vitals BP: (!) 158/91  See Function Navigator for Current Functional Status.   Therapy/Group: Individual Therapy  Maximino Cozzolino K Arnette 02/22/2018, 9:55 AM

## 2018-02-22 NOTE — Progress Notes (Signed)
Patient will not open her mouth to eat; attempted to give pudding but she will just lick and continue mumbling; offered ensure in cup but same thing patient will lick and continue mumbling. Patient daughter called to check on patient and continued to  express concern re: pt sleeping too much. Pam PA notified.Continued to monitor.

## 2018-02-22 NOTE — Progress Notes (Signed)
Recreational Therapy Session Note  Patient Details  Name: [name] MRN: [id number] Date of Birth: 21-Apr-1935 Today's Date: 02/22/2018  TR eval deferred at this time as pt is not appropriate for TR services.  Will continue to monitor through team. Kylie Simmonds 02/22/2018, 9:33 AM

## 2018-02-22 NOTE — Progress Notes (Signed)
Speech Language Pathology Daily Session Note  Patient Details  Name: [name] MRN: [id number] Date of Birth: 08-Apr-1935  Today's Date: 02/22/2018 SLP Individual Time: 1300-1320 SLP Individual Time Calculation (min): 20 min  Missed Time: 40 mins, fatigue   Short Term Goals: Week 2: SLP Short Term Goal 1 (Week 2): Patient will identify functional items from a field of 2 with 50% accuracy with Max A multimodal cues.  SLP Short Term Goal 2 (Week 2): Patient will answer basic yes/no questions in regards to biographical information with 50% accuracy and Max A multimodal cues.  SLP Short Term Goal 3 (Week 2): Patient will follow basic 1-step directions during functional tasks with Max A multimodal cues.  SLP Short Term Goal 4 (Week 2): Patient will complete basic and functional problem solving tasks with Max A multimodal cues.  SLP Short Term Goal 5 (Week 2): Patient will demonstrate sustained attention for 5-minute intervals during functional tasks with Max A verbal cues.  SLP Short Term Goal 6 (Week 2): Patient will self-monitor verbal errors x2 within a 45-minute session with Max A multimodal cues.   Skilled Therapeutic Interventions: Skilled treatment session focused on cognitive-linguistic goals. Upon arrival, patient was asleep while upright in the recliner. Patient would arouse to voice for minimal amounts of time but would never open her eyes despite Max A multimodal cues. SLP attempted to engage patient in therapeutic activities with Max A multimodal cues, however, patient too lethargic to effectively participate, therefore, patient ended 40 minutes early. Patient left supine in recliner with quick release belt in place and chair alarm on. Continue with current plan of care.      Function:   Cognition Comprehension Comprehension assist level: Understands basic less than 25% of the time/ requires cueing >75% of the time  Expression   Expression assist level: Expresses basis less than  25% of the time/requires cueing >75% of the time.  Social Interaction Social Interaction assist level: Interacts appropriately less than 25% of the time. May be withdrawn or combative.  Problem Solving Problem solving assist level: Solves basic less than 25% of the time - needs direction nearly all the time or does not effectively solve problems and may need a restraint for safety  Memory Memory assist level: Recognizes or recalls less than 25% of the time/requires cueing greater than 75% of the time    Pain No indications of pain   Therapy/Group: Individual Therapy  Lenis Nettleton 02/22/2018, 1:29 PM

## 2018-02-22 NOTE — Progress Notes (Signed)
Speech Language Pathology Daily Session Note  Patient Details  Name: MASSA PE MRN: [id number] Date of Birth: 15-Feb-1935  Today's Date: 02/22/2018 SLP Individual Time: 1030-1038 SLP Individual Time Calculation (min): 8 min  Short Term Goals: Week 2: SLP Short Term Goal 1 (Week 2): Patient will identify functional items from a field of 2 with 50% accuracy with Max A multimodal cues.  SLP Short Term Goal 2 (Week 2): Patient will answer basic yes/no questions in regards to biographical information with 50% accuracy and Max A multimodal cues.  SLP Short Term Goal 3 (Week 2): Patient will follow basic 1-step directions during functional tasks with Max A multimodal cues.  SLP Short Term Goal 4 (Week 2): Patient will complete basic and functional problem solving tasks with Max A multimodal cues.  SLP Short Term Goal 5 (Week 2): Patient will demonstrate sustained attention for 5-minute intervals during functional tasks with Max A verbal cues.  SLP Short Term Goal 6 (Week 2): Patient will self-monitor verbal errors x2 within a 45-minute session with Max A multimodal cues.   Skilled Therapeutic Interventions: Skilled treatment session focused on cognition goals. SLP received pt sleeping recliner. SLP repositioned pt, applied cold washcloth and provided Total A physical and verbal stimulation. Despite this, pt only grunted and didn't demonstrate any alertness level. Pt left in recliner, safety belt donned and all needs within reach. Continue per current plan of care.      Function:  Eating Eating                 Cognition Comprehension Comprehension assist level: Understands basic less than 25% of the time/ requires cueing >75% of the time  Expression   Expression assist level: Expresses basis less than 25% of the time/requires cueing >75% of the time.  Social Interaction Social Interaction assist level: Interacts appropriately less than 25% of the time. May be withdrawn or combative.   Problem Solving Problem solving assist level: Solves basic less than 25% of the time - needs direction nearly all the time or does not effectively solve problems and may need a restraint for safety  Memory Memory assist level: Recognizes or recalls less than 25% of the time/requires cueing greater than 75% of the time    Pain    Therapy/Group: Individual Therapy  Lorenza Winkleman 02/22/2018, 11:16 AM

## 2018-02-22 NOTE — Progress Notes (Signed)
Occupational Therapy Session Note  Patient Details  Name: LETINA LUCKETT MRN: [id number] Date of Birth: October 01, 1935  Today's Date: 02/22/2018 OT Individual Time: 1100-1115 OT Individual Time Calculation (min): 15 min   Short Term Goals: Week 2:  OT Short Term Goal 1 (Week 2): Pt will safely transfer from w/c to toilet with consistent mod A OT Short Term Goal 2 (Week 2): Pt will complete oral hygiene with mod multimodal cues OT Short Term Goal 3 (Week 2): Pt will complete seated level LB dressing with mod  Skilled Therapeutic Interventions/Progress Updates:    Pt greeted asleep in recliner snoring. Brought pt to the sink in recliner, lowered legs, and raised head of recliner. Tried to wake pt with environmental changes. Applied cold wash cloth to face to try to engage pt in face washing task, but pt would not wake up to participate. Pt left reclined in recliner with safety belt and chair alarm on.   Therapy Documentation Precautions:  Precautions Precautions: Fall Restrictions Weight Bearing Restrictions: No General: General OT Amount of Missed Time: 41 Minutes OT Missed Treatment Reason: Patient fatigue  See Function Navigator for Current Functional Status.   Therapy/Group: Individual Therapy  Valma Cava 02/22/2018, 12:50 PM

## 2018-02-22 NOTE — Progress Notes (Signed)
Subjective/Complaints:  No issues overnite, sleep wake disrupted    ROS- cannot obtain due to aphasia Objective: Vital Signs: Blood pressure 130/81, pulse 71, temperature 99.9 F (37.7 C), temperature source Axillary, resp. rate 16, height _0  (1.6 m), weight 60.1 kg (132 lb 7.9 oz), SpO2 98 %. No results found. Results for orders placed or performed during the hospital encounter of 02/13/18 (from the past 72 hour(s))  Glucose, capillary     Status: Abnormal   Collection Time: 02/19/18  8:14 AM  Result Value Ref Range   Glucose-Capillary 244 (H) 65 - 99 mg/dL  Glucose, capillary     Status: Abnormal   Collection Time: 02/19/18  9:55 AM  Result Value Ref Range   Glucose-Capillary 288 (H) 65 - 99 mg/dL  Glucose, capillary     Status: Abnormal   Collection Time: 02/19/18 11:35 AM  Result Value Ref Range   Glucose-Capillary 282 (H) 65 - 99 mg/dL  Glucose, capillary     Status: Abnormal   Collection Time: 02/19/18  4:38 PM  Result Value Ref Range   Glucose-Capillary 214 (H) 65 - 99 mg/dL  Basic metabolic panel     Status: Abnormal   Collection Time: 02/19/18  5:16 PM  Result Value Ref Range   Sodium 134 (L) 135 - 145 mmol/L   Potassium 5.2 (H) 3.5 - 5.1 mmol/L   Chloride 102 101 - 111 mmol/L   CO2 23 22 - 32 mmol/L   Glucose, Bld 210 (H) 65 - 99 mg/dL   BUN 25 (H) 6 - 20 mg/dL   Creatinine, Ser 1.27 (H) 0.44 - 1.00 mg/dL   Calcium 9.0 8.9 - 10.3 mg/dL   GFR calc non Af Amer 39 (L) >60 mL/min   GFR calc Af Amer 45 (L) >60 mL/min    Comment: (NOTE) The eGFR has been calculated using the CKD EPI equation. This calculation has not been validated in all clinical situations. eGFR's persistently <60 mL/min signify possible Chronic Kidney Disease.    Anion gap 9 5 - 15    Comment: Performed at [facility] [address], [place], [place] [address]  Glucose, capillary     Status: Abnormal   Collection Time: 02/19/18  8:40 PM  Result Value Ref Range   Glucose-Capillary  165 (H) 65 - 99 mg/dL  CBC with Differential/Platelet     Status: None   Collection Time: 02/20/18  4:40 AM  Result Value Ref Range   WBC 9.6 4.0 - 10.5 K/uL   RBC 4.25 3.87 - 5.11 MIL/uL   Hemoglobin 13.7 12.0 - 15.0 g/dL   HCT 40.4 36.0 - 46.0 %   MCV 95.1 78.0 - 100.0 fL   MCH 32.2 26.0 - 34.0 pg   MCHC 33.9 30.0 - 36.0 g/dL   RDW 12.9 11.5 - 15.5 %   Platelets 204 150 - 400 K/uL   Neutrophils Relative % 67 %   Neutro Abs 6.5 1.7 - 7.7 K/uL   Lymphocytes Relative 26 %   Lymphs Abs 2.5 0.7 - 4.0 K/uL   Monocytes Relative 6 %   Monocytes Absolute 0.6 0.1 - 1.0 K/uL   Eosinophils Relative 1 %   Eosinophils Absolute 0.1 0.0 - 0.7 K/uL   Basophils Relative 0 %   Basophils Absolute 0.0 0.0 - 0.1 K/uL    Comment: Performed at [facility], [place] [address] [place], [place] [address]  Basic metabolic panel     Status: Abnormal  Collection Time: 02/20/18  4:40 AM  Result Value Ref Range   Sodium 136 135 - 145 mmol/L   Potassium 4.7 3.5 - 5.1 mmol/L   Chloride 104 101 - 111 mmol/L   CO2 23 22 - 32 mmol/L   Glucose, Bld 193 (H) 65 - 99 mg/dL   BUN 22 (H) 6 - 20 mg/dL   Creatinine, Ser 1.20 (H) 0.44 - 1.00 mg/dL   Calcium 8.7 (L) 8.9 - 10.3 mg/dL   GFR calc non Af Amer 42 (L) >60 mL/min   GFR calc Af Amer 48 (L) >60 mL/min    Comment: (NOTE) The eGFR has been calculated using the CKD EPI equation. This calculation has not been validated in all clinical situations. eGFR's persistently <60 mL/min signify possible Chronic Kidney Disease.    Anion gap 9 5 - 15    Comment: Performed at [facility] [address], [place], [place] [address]  Glucose, capillary     Status: Abnormal   Collection Time: 02/20/18  6:28 AM  Result Value Ref Range   Glucose-Capillary 180 (H) 65 - 99 mg/dL  Glucose, capillary     Status: Abnormal   Collection Time: 02/20/18 11:30 AM  Result Value Ref Range   Glucose-Capillary 199 (H) 65 - 99 mg/dL  Glucose, capillary     Status: Abnormal    Collection Time: 02/20/18  4:54 PM  Result Value Ref Range   Glucose-Capillary 233 (H) 65 - 99 mg/dL  Glucose, capillary     Status: Abnormal   Collection Time: 02/20/18  8:39 PM  Result Value Ref Range   Glucose-Capillary 283 (H) 65 - 99 mg/dL  Glucose, capillary     Status: Abnormal   Collection Time: 02/21/18  6:47 AM  Result Value Ref Range   Glucose-Capillary 214 (H) 65 - 99 mg/dL  Glucose, capillary     Status: Abnormal   Collection Time: 02/21/18 11:35 AM  Result Value Ref Range   Glucose-Capillary 195 (H) 65 - 99 mg/dL  Glucose, capillary     Status: Abnormal   Collection Time: 02/21/18  4:18 PM  Result Value Ref Range   Glucose-Capillary 153 (H) 65 - 99 mg/dL  Glucose, capillary     Status: Abnormal   Collection Time: 02/21/18  8:29 PM  Result Value Ref Range   Glucose-Capillary 159 (H) 65 - 99 mg/dL  Basic metabolic panel     Status: Abnormal   Collection Time: 02/22/18  5:04 AM  Result Value Ref Range   Sodium 136 135 - 145 mmol/L   Potassium 4.4 3.5 - 5.1 mmol/L   Chloride 105 101 - 111 mmol/L   CO2 20 (L) 22 - 32 mmol/L   Glucose, Bld 172 (H) 65 - 99 mg/dL   BUN 21 (H) 6 - 20 mg/dL   Creatinine, Ser 1.17 (H) 0.44 - 1.00 mg/dL   Calcium 9.3 8.9 - 10.3 mg/dL   GFR calc non Af Amer 43 (L) >60 mL/min   GFR calc Af Amer 50 (L) >60 mL/min    Comment: (NOTE) The eGFR has been calculated using the CKD EPI equation. This calculation has not been validated in all clinical situations. eGFR's persistently <60 mL/min signify possible Chronic Kidney Disease.    Anion gap 11 5 - 15    Comment: Performed at [facility] [address] [place], [place] [address]  Glucose, capillary     Status: Abnormal   Collection Time: 02/22/18  6:23 AM  Result Value Ref Range   Glucose-Capillary 173 (H) 65 - 99 mg/dL     HEENT: normal Cardio: RRR and no murmur Resp: CTA B/L and no wheezes GI: BS positive and NT, ND Extremity:  No Edema Skin:   Bruise forearms Neuro:  Abnormal Sensory reduced sensation to pinch Right Upper and Lower limb, Abnormal Motor 3/5 RUE and RLE, Abnormal FMC Ataxic/ dec FMC and Aphasic Musc/Skel:  Other no pain with UE or LE ROM Gen NAD   Assessment/Plan: 1. Functional deficits secondary to Left thalamic and Left parietal infarct which require 3+ hours per day of interdisciplinary therapy in a comprehensive inpatient rehab setting. Physiatrist is providing close team supervision and 24 hour management of active medical problems listed below. Physiatrist and rehab team continue to assess barriers to discharge/monitor patient progress toward functional and medical goals. FIM: Function - Bathing Position: Shower Body parts bathed by patient: Right arm, Chest, Left upper leg Body parts bathed by helper: Left arm, Abdomen, Front perineal area, Buttocks, Right upper leg, Right lower leg, Left lower leg, Back Bathing not applicable: Buttocks Assist Level: Touching or steadying assistance(Pt > 75%)  Function- Upper Body Dressing/Undressing What is the patient wearing?: Pull over shirt/dress Pull over shirt/dress - Perfomed by patient: Thread/unthread right sleeve, Thread/unthread left sleeve, Put head through opening, Pull shirt over trunk Pull over shirt/dress - Perfomed by helper: Thread/unthread right sleeve, Thread/unthread left sleeve, Pull shirt over trunk, Put head through opening Assist Level: Touching or steadying assistance(Pt > 75%) Function - Lower Body Dressing/Undressing What is the patient wearing?: Non-skid slipper socks Position: Wheelchair/chair at sink Non-skid slipper socks- Performed by patient: Don/doff right sock Non-skid slipper socks- Performed by helper: Don/doff right sock, Don/doff left sock Assist for footwear: Dependant Assist for lower body dressing: Touching or steadying assistance (Pt > 75%)  Function - Toileting Toileting activity did not occur: No continent bowel/bladder event Toileting steps  completed by patient: Performs perineal hygiene Toileting steps completed by helper: Adjust clothing prior to toileting, Performs perineal hygiene, Adjust clothing after toileting Toileting Assistive Devices: Grab bar or rail Assist level: Touching or steadying assistance (Pt.75%)  Function - Air cabin crew transfer activity did not occur: Safety/medical concerns Toilet transfer assistive device: Grab bar Assist level to toilet: Maximal assist (Pt 25 - 49%/lift and lower) Assist level from toilet: Maximal assist (Pt 25 - 49%/lift and lower) Assist level to bedside commode (at bedside): Maximal assist (Pt 25 - 49%/lift and lower) Assist level from bedside commode (at bedside): Maximal assist (Pt 25 - 49%/lift and lower)  Function - Chair/bed transfer Chair/bed transfer method: Squat pivot Chair/bed transfer assist level: Total assist (Pt < 25%) Chair/bed transfer assistive device: Armrests Chair/bed transfer details: Manual facilitation for placement, Manual facilitation for weight shifting, Tactile cues for sequencing, Tactile cues for initiation, Tactile cues for placement  Function - Locomotion: Wheelchair Will patient use wheelchair at discharge?: No Assist Level: Dependent (Pt equals 0%)(for transport) Function - Locomotion: Ambulation Assistive device: Hand held assist Max distance: 100 ft Assist level: 2 helpers Assist level: 2 helpers Walk 50 feet with 2 turns activity did not occur: Safety/medical concerns Assist level: 2 helpers Walk 150 feet activity did not occur: Safety/medical concerns Walk 10 feet on uneven surfaces activity did not occur: Safety/medical concerns  Function - Comprehension Comprehension: Auditory Comprehension assist level: Understands basic less than 25% of the time/ requires cueing >75% of the time  Function - Expression Expression: Verbal Expression assist level: Expresses basis less than  25% of the time/requires cueing >75% of the  time.  Function - Social Interaction Social Interaction assist level: Interacts appropriately less than 25% of the time. May be withdrawn or combative.  Function - Problem Solving Problem solving assist level: Solves basic less than 25% of the time - needs direction nearly all the time or does not effectively solve problems and may need a restraint for safety  Function - Memory Memory assist level: Recognizes or recalls less than 25% of the time/requires cueing greater than 75% of the time Patient normally able to recall (first 3 days only): None of the above  Medical Problem List and Plan:  1. Functional deficits secondary to infarcts in left thalamus and left parietal periventricular white matter  -admit to inpatient rehab  PT, OT, SLP CIR level,2. DVT Prophylaxis/Anticoagulation: Pharmaceutical: Lovenox and Other (comment)  3. Pain Management: tylenol prn  4. Mood: Patient currently with poor awareness of deficits. LCSW to follow for evaluation when appropriate.  -pt has demonstrating increased confusion and agitation at night, poor sleep patterns  -check sleep chart  -begin scheduled, low dose seroquel  5. Neuropsych: This patient is not capable of making decisions on her own behalf.  6. Skin/Wound Care: routine pressure relief measures  7. Fluids/Electrolytes/Nutrition: encourage fluids, K is corrected 8. Chronic combined systolic/diastolic CHF/chemo induced CM: Heart healthy diet. Monitor for signs of overload and check weights daily. BB discontinued due to bradycardia. Continue Lisinopril daily. Used lasix prn edema at home.  Vitals:   02/21/18 1500 02/22/18 0111  BP: 136/83 130/81  Pulse: 66 71  Resp:  16  Temp: 97.8 F (36.6 C) 99.9 F (37.7 C)  SpO2: 98% 98%  BP controlled 4/4 9. CAD w/p PTCA: On ASA and Zetia.  10. CKD: Avoid nephrotoxic medications. Monitor with serial checks.  11.T2DM: Hgb A1C- 8.6. Blood sugars poorly controlled.--Used Lantus 80 units with metformin  at nights. Increase Lantus to 15 units daily and titrate as needed. Monitor BS ac/hs.fair control 4/3, increase lantus to 30U, improved CBG (last 3)  Recent Labs    02/21/18 1618 02/21/18 2029 02/22/18 0623  GLUCAP 153* 159* 173*  12. Hypothyroid: On supplement.   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  Low grade temp ACharlett Blake4/02/2018, 7:50 AM

## 2018-02-23 ENCOUNTER — Inpatient Hospital Stay (HOSPITAL_COMMUNITY): Payer: Medicare Other | Admitting: Occupational Therapy

## 2018-02-23 ENCOUNTER — Inpatient Hospital Stay (HOSPITAL_COMMUNITY): Payer: Medicare Other | Admitting: Physical Therapy

## 2018-02-23 ENCOUNTER — Inpatient Hospital Stay (HOSPITAL_COMMUNITY): Payer: Medicare Other | Admitting: Speech Pathology

## 2018-02-23 LAB — GLUCOSE, CAPILLARY
GLUCOSE-CAPILLARY: 223 mg/dL — AB (ref 65–99)
GLUCOSE-CAPILLARY: 203 mg/dL — AB (ref 65–99)
GLUCOSE-CAPILLARY: 191 mg/dL — AB (ref 65–99)
Glucose-Capillary: 170 mg/dL — ABNORMAL HIGH (ref 65–99)

## 2018-02-23 NOTE — Progress Notes (Signed)
Subjective/Complaints: Severe  Receptive> exp aphasia    ROS- cannot obtain due to aphasia Objective: Vital Signs: Blood pressure 135/85, pulse 86, temperature 97.8 F (36.6 C), temperature source Oral, resp. rate 17, height 5' 3"  (1.6 m), weight 60.1 kg (132 lb 7.9 oz), SpO2 91 %. No results found. Results for orders placed or performed during the hospital encounter of 02/13/18 (from the past 72 hour(s))  Glucose, capillary     Status: Abnormal   Collection Time: 02/20/18 11:30 AM  Result Value Ref Range   Glucose-Capillary 199 (H) 65 - 99 mg/dL  Glucose, capillary     Status: Abnormal   Collection Time: 02/20/18  4:54 PM  Result Value Ref Range   Glucose-Capillary 233 (H) 65 - 99 mg/dL  Glucose, capillary     Status: Abnormal   Collection Time: 02/20/18  8:39 PM  Result Value Ref Range   Glucose-Capillary 283 (H) 65 - 99 mg/dL  Glucose, capillary     Status: Abnormal   Collection Time: 02/21/18  6:47 AM  Result Value Ref Range   Glucose-Capillary 214 (H) 65 - 99 mg/dL  Glucose, capillary     Status: Abnormal   Collection Time: 02/21/18 11:35 AM  Result Value Ref Range   Glucose-Capillary 195 (H) 65 - 99 mg/dL  Glucose, capillary     Status: Abnormal   Collection Time: 02/21/18  4:18 PM  Result Value Ref Range   Glucose-Capillary 153 (H) 65 - 99 mg/dL  Glucose, capillary     Status: Abnormal   Collection Time: 02/21/18  8:29 PM  Result Value Ref Range   Glucose-Capillary 159 (H) 65 - 99 mg/dL  Basic metabolic panel     Status: Abnormal   Collection Time: 02/22/18  5:04 AM  Result Value Ref Range   Sodium 136 135 - 145 mmol/L   Potassium 4.4 3.5 - 5.1 mmol/L   Chloride 105 101 - 111 mmol/L   CO2 20 (L) 22 - 32 mmol/L   Glucose, Bld 172 (H) 65 - 99 mg/dL   BUN 21 (H) 6 - 20 mg/dL   Creatinine, Ser 1.17 (H) 0.44 - 1.00 mg/dL   Calcium 9.3 8.9 - 10.3 mg/dL   GFR calc non Af Amer 43 (L) >60 mL/min   GFR calc Af Amer 50 (L) >60 mL/min    Comment: (NOTE) The eGFR has  been calculated using the CKD EPI equation. This calculation has not been validated in all clinical situations. eGFR's persistently <60 mL/min signify possible Chronic Kidney Disease.    Anion gap 11 5 - 15    Comment: Performed at [facility] [address] [place], [place] [address]  Glucose, capillary     Status: Abnormal   Collection Time: 02/22/18  6:23 AM  Result Value Ref Range   Glucose-Capillary 173 (H) 65 - 99 mg/dL  Glucose, capillary     Status: Abnormal   Collection Time: 02/22/18  8:03 AM  Result Value Ref Range   Glucose-Capillary 164 (H) 65 - 99 mg/dL  Glucose, capillary     Status: Abnormal   Collection Time: 02/22/18 11:45 AM  Result Value Ref Range   Glucose-Capillary 163 (H) 65 - 99 mg/dL  Glucose, capillary     Status: Abnormal   Collection Time: 02/22/18 12:02 PM  Result Value Ref Range   Glucose-Capillary 153 (H) 65 - 99 mg/dL  Glucose, capillary     Status: Abnormal   Collection Time: 02/22/18  4:39 PM  Result  Value Ref Range   Glucose-Capillary 175 (H) 65 - 99 mg/dL  Glucose, capillary     Status: Abnormal   Collection Time: 02/22/18  9:16 PM  Result Value Ref Range   Glucose-Capillary 222 (H) 65 - 99 mg/dL  Glucose, capillary     Status: Abnormal   Collection Time: 02/23/18  5:56 AM  Result Value Ref Range   Glucose-Capillary 170 (H) 65 - 99 mg/dL     HEENT: normal Cardio: RRR and no murmur Resp: CTA B/L and no wheezes GI: BS positive and NT, ND Extremity:  No Edema Skin:   Bruise forearms Neuro: Abnormal Sensory reduced sensation to pinch Right Upper and Lower limb, Abnormal Motor 3/5 RUE and RLE, Abnormal FMC Ataxic/ dec FMC and Aphasic Musc/Skel:  Other no pain with UE or LE ROM Gen NAD   Assessment/Plan: 1. Functional deficits secondary to Left thalamic and Left parietal infarct which require 3+ hours per day of interdisciplinary therapy in a comprehensive inpatient rehab setting. Physiatrist is providing close team supervision  and 24 hour management of active medical problems listed below. Physiatrist and rehab team continue to assess barriers to discharge/monitor patient progress toward functional and medical goals. FIM: Function - Bathing Position: Shower Body parts bathed by patient: Right arm, Chest, Left upper leg Body parts bathed by helper: Left arm, Abdomen, Front perineal area, Buttocks, Right upper leg, Right lower leg, Left lower leg, Back Bathing not applicable: Buttocks Assist Level: Touching or steadying assistance(Pt > 75%)  Function- Upper Body Dressing/Undressing What is the patient wearing?: Pull over shirt/dress Pull over shirt/dress - Perfomed by patient: Thread/unthread right sleeve, Thread/unthread left sleeve, Put head through opening, Pull shirt over trunk Pull over shirt/dress - Perfomed by helper: Thread/unthread right sleeve, Thread/unthread left sleeve, Pull shirt over trunk, Put head through opening Assist Level: Touching or steadying assistance(Pt > 75%) Function - Lower Body Dressing/Undressing What is the patient wearing?: Non-skid slipper socks Position: Wheelchair/chair at sink Non-skid slipper socks- Performed by patient: Don/doff right sock Non-skid slipper socks- Performed by helper: Don/doff right sock, Don/doff left sock Assist for footwear: Dependant Assist for lower body dressing: Touching or steadying assistance (Pt > 75%)  Function - Toileting Toileting activity did not occur: No continent bowel/bladder event Toileting steps completed by patient: Performs perineal hygiene Toileting steps completed by helper: Adjust clothing prior to toileting, Performs perineal hygiene, Adjust clothing after toileting Toileting Assistive Devices: Grab bar or rail Assist level: Touching or steadying assistance (Pt.75%)  Function - Air cabin crew transfer activity did not occur: Safety/medical concerns Toilet transfer assistive device: Grab bar Assist level to toilet:  Maximal assist (Pt 25 - 49%/lift and lower) Assist level from toilet: Maximal assist (Pt 25 - 49%/lift and lower) Assist level to bedside commode (at bedside): Maximal assist (Pt 25 - 49%/lift and lower) Assist level from bedside commode (at bedside): Maximal assist (Pt 25 - 49%/lift and lower)  Function - Chair/bed transfer Chair/bed transfer method: Stand pivot Chair/bed transfer assist level: Maximal assist (Pt 25 - 49%/lift and lower) Chair/bed transfer assistive device: Armrests Chair/bed transfer details: Manual facilitation for placement, Manual facilitation for weight shifting, Tactile cues for sequencing, Tactile cues for initiation, Tactile cues for placement, Verbal cues for sequencing, Verbal cues for technique, Manual facilitation for weight bearing, Verbal cues for precautions/safety, Tactile cues for posture  Function - Locomotion: Wheelchair Will patient use wheelchair at discharge?: No Assist Level: Dependent (Pt equals 0%)(for transport) Function - Locomotion: Ambulation Assistive device: Hand held assist  Max distance: 52' Assist level: 2 helpers Assist level: 2 helpers Walk 50 feet with 2 turns activity did not occur: Safety/medical concerns Assist level: 2 helpers Walk 150 feet activity did not occur: Safety/medical concerns Walk 10 feet on uneven surfaces activity did not occur: Safety/medical concerns  Function - Comprehension Comprehension: Auditory Comprehension assist level: Understands basic less than 25% of the time/ requires cueing >75% of the time  Function - Expression Expression: Verbal Expression assist level: Expresses basis less than 25% of the time/requires cueing >75% of the time.  Function - Social Interaction Social Interaction assist level: Interacts appropriately less than 25% of the time. May be withdrawn or combative.  Function - Problem Solving Problem solving assist level: Solves basic less than 25% of the time - needs direction nearly  all the time or does not effectively solve problems and may need a restraint for safety  Function - Memory Memory assist level: Recognizes or recalls less than 25% of the time/requires cueing greater than 75% of the time Patient normally able to recall (first 3 days only): None of the above  Medical Problem List and Plan:  1. Functional deficits secondary to infarcts in left thalamus and left parietal periventricular white matter  -admit to inpatient rehab  PT, OT, SLP CIR level,2. DVT Prophylaxis/Anticoagulation: Pharmaceutical: Lovenox and Other (comment)  3. Pain Management: tylenol prn  4. Mood: Patient currently with poor awareness of deficits. LCSW to follow for evaluation when appropriate.  -pt has demonstrating increased confusion and agitation at night, poor sleep patterns  -check sleep chart  -begin scheduled, low dose seroquel  5. Neuropsych: This patient is not capable of making decisions on her own behalf.  6. Skin/Wound Care: routine pressure relief measures  7. Fluids/Electrolytes/Nutrition: encourage fluids, K is corrected intake fluctuates with level of alertness    8. Chronic combined systolic/diastolic CHF/chemo induced CM: Heart healthy diet. Monitor for signs of overload and check weights daily. BB discontinued due to bradycardia. Continue Lisinopril daily. Used lasix prn edema at home.  Vitals:   02/23/18 0100 02/23/18 0821  BP: 103/67 135/85  Pulse: 86   Resp: 17   Temp: 97.8 F (36.6 C)   SpO2: 91%   BP controlled 4/5 9. CAD w/p PTCA: On ASA and Zetia.  10. CKD: Avoid nephrotoxic medications. Monitor with serial checks.  11.T2DM: Hgb A1C- 8.6. Blood sugars poorly controlled.--Used Lantus 80 units with metformin at nights. Increase Lantus to 15 units daily and titrate as needed. Monitor BS ac/hs.fair control 4/3, increase lantus to 30U, improved CBG (last 3)  Recent Labs    02/22/18 1639 02/22/18 2116 02/23/18 0556  GLUCAP 175* 222* 170*  12. Hypothyroid:  On supplement.   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  Low grade temp ACharlett Blake4/03/2018, 9:31 AM

## 2018-02-23 NOTE — Progress Notes (Signed)
Speech Language Pathology Daily Session Note  Patient Details  Name: [name] MRN: [id number] Date of Birth: [birth year]  Today's Date: 02/23/2018 SLP Individual Time: 1430-1500 SLP Individual Time Calculation (min): 30 min  Short Term Goals: Week 2: SLP Short Term Goal 1 (Week 2): Patient will identify functional items from a field of 2 with 50% accuracy with Max A multimodal cues.  SLP Short Term Goal 2 (Week 2): Patient will answer basic yes/no questions in regards to biographical information with 50% accuracy and Max A multimodal cues.  SLP Short Term Goal 3 (Week 2): Patient will follow basic 1-step directions during functional tasks with Max A multimodal cues.  SLP Short Term Goal 4 (Week 2): Patient will complete basic and functional problem solving tasks with Max A multimodal cues.  SLP Short Term Goal 5 (Week 2): Patient will demonstrate sustained attention for 5-minute intervals during functional tasks with Max A verbal cues.  SLP Short Term Goal 6 (Week 2): Patient will self-monitor verbal errors x2 within a 45-minute session with Max A multimodal cues.   Skilled Therapeutic Interventions: Skilled treatment session focused on cognitive-linguistic goals. Upon arrival, patient was asleep while supine in bed but easily awakened to voice. However, patient did not open her eyes. Patient was transferred to EOB with total +2 assist for safety in which patient began to open her eyes. Patient ambulated to the bathroom with +2 assist for safety and successfully voided with extra time. Total A was needed for hygiene and patient was transferred to the recliner. Patient performed basic self-care tasks with Max A verbal and tactile cues and answered basic yes/no questions in 50% of opportunities. Overall, patient demonstrated less jargon this session with increased social and automatic appropriate responses. Patient left upright in recliner with chair alarm on, quick release belt in place and all  needs within reach. Continue with current plan of care.      Function:   Cognition Comprehension Comprehension assist level: Understands basic less than 25% of the time/ requires cueing >75% of the time  Expression   Expression assist level: Expresses basis less than 25% of the time/requires cueing >75% of the time.  Social Interaction Social Interaction assist level: Interacts appropriately less than 25% of the time. May be withdrawn or combative.  Problem Solving Problem solving assist level: Solves basic less than 25% of the time - needs direction nearly all the time or does not effectively solve problems and may need a restraint for safety  Memory Memory assist level: Recognizes or recalls less than 25% of the time/requires cueing greater than 75% of the time    Pain No/Denies Pain   Therapy/Group: Individual Therapy  Jaelyn Cloninger 02/23/2018, 3:08 PM

## 2018-02-23 NOTE — Progress Notes (Addendum)
Physical Therapy Session Note  Patient Details  Name: [name] MRN: [id number] Date of Birth: February 07, 1935  Today's Date: 02/23/2018 PT Individual Time: 4098-1191 AND 1600-1705 PT Individual Time Calculation (min): 45 min  AND 65 min (20 min make up time)  and Today's Date: 02/23/2018 PT Missed Time: 15 Minutes Missed Time Reason: Patient fatigue  Short Term Goals: Week 2:  PT Short Term Goal 1 (Week 2): STG=LTG due to ELOS  Skilled Therapeutic Interventions/Progress Updates:   Session 1:  Pt in recliner and appeared agreeable to therapy, eyes open and speaking non-fluent jargon to son and daughter-in-law. No evidence of pain. Focused on tolerance to upright activity and functional tasks this session. Pt eating breakfast and initiated 2-3 bites of oatmeal w/ encouragement. Verbal and tactile cues to initiate lifting spoon w/ LUE. Mod assist to don clothing, total assist to thread pants and RUE into shirt, otherwise able to initiate putting on clothes w/o verbal cues once in standing. Max assist x1 to stand while 2nd helper assisted w/ clothing management for safety. Transferred to w/c via stand pivot w/ mod assist and total assist w/c transport to/from gym. Son present while in gym. Ambulated 50' w/ max assist x2 HHA. Minimal verbal cues to initiate gait, however unable to safely initiate a 2nd bout of gait after a brief rest break. Transferred to w/c max assist and pt w/ decreased eye-opening at this time, resisting any upright/out-of-chair activity. Returned to room and transferred to recliner, total assist stand pivot, ended session in recliner and in care of family members, all needs met. Quick release belt and chair alarm activated.   Session 2:  Pt in recliner and appeared agreeable to therapy, no evidence of pain. Session focused on tolerance to upright activity and functional tasks as well as initiation w/ functional tasks. Pt tugging at pants and brief. Ambulated to/from toilet in  bathroom w/ max assist x1 HHA. Verbal/manual/tactile cues for turning technique to sit as well as emphasizing that pt was able to void as she was on toilet, unsuccessful attempt. Total assist w/c transport to/from gym for time management. Offered pt 2 choices for snack, she immediately reached for one and initiated opening lid w/o prompting. Set up at table in day room and assisted w/ eating yogurt, providing min-mod cues for initiation of task and occasional hand-over-hand assist to use spoon w/ LUE vs RUE for safety. Pt w/ increased initiation of performing functional eating task and increased tolerance to activity w/ this therapist compared to previous sessions. Pt able to appropriately cease talking when taking bites and finished swallowing prior to initiating conversation and able to attend to task of eating for 3-4 bites at a time w/o cues. During this time, provided skilled education to daughter [name]) regarding CLOF, d/c recommendations, family education sessions, and expected recovery of stroke impairments as they related to weakness and motor planning. [name] planning to arrange for 24/7 physical assist at home from herself, close family friend Jackelyn Poling), or paid caregiver. Discussed coming in for family education for herself, Jackelyn Poling, pt's son, and pt's daughter-in-law as those will most likely be the individuals providing care for pt. Pt's husband is unable to provide assist at this time. Will make treatment team aware of family edu times that work for pt's family. Ended session in w/c and in care of daughter, all needs met. Quick release belt engaged and NT present.   Therapy Documentation Precautions:  Precautions Precautions: Fall Restrictions Weight Bearing Restrictions: No General: PT Amount  of Missed Time (min): 15 Minutes PT Missed Treatment Reason: Patient fatigue Vital Signs: Therapy Vitals BP: 135/85 Pain: Pain Assessment Pain Scale: Faces Faces Pain Scale: No hurt  See  Function Navigator for Current Functional Status.   Therapy/Group: Individual Therapy  Zoye Chandra K Arnette 02/23/2018, 8:54 AM

## 2018-02-23 NOTE — Progress Notes (Addendum)
Social Work Patient ID: [name], female   DOB: 06-23-1935, 83 y.o.   MRN: [id number] Daughter-[name] feels pt lost tow days when she had a medical issues-seizure and feels she needs to stay a few extra days since she is truly benefiting from therapies here. Will ask MD about this. Family is here daily to participate in therapies with. Texted MD who is in agreement with extending her two extra days for family training and loss of two days from medical issues. [name] made aware and is happy about this she feels Mom is benefiting from being here.

## 2018-02-23 NOTE — Progress Notes (Signed)
Occupational Therapy Session Note  Patient Details  Name: [name] MRN: [id number] Date of Birth: 05-17-1935  Today's Date: 02/23/2018 OT Individual Time: 1100-1155 OT Individual Time Calculation (min): 55 min   Short Term Goals: Week 2:  OT Short Term Goal 1 (Week 2): Pt will safely transfer from w/c to toilet with consistent mod A OT Short Term Goal 2 (Week 2): Pt will complete oral hygiene with mod multimodal cues OT Short Term Goal 3 (Week 2): Pt will complete seated level LB dressing with mod  Skilled Therapeutic Interventions/Progress Updates:    Pt greeted sitting in recliner with family present, awake and talking although nonsensical. Pt followed command to stand and transfer to wc with Mod A. Pt brought to therapy apartment and worked on sit<>stands x4 at the counter. Tried to engage pt in dish washing task in standing, but pt unable to tolerate standing activity. Pt brought to table in kitchen and worked on  B UE coordination with folding task. OT provided hand over hand A to R UE to facilitate appropriate movements. Attempted to engage pt in visual scanning and name finding task using plastic fruit. Pt unable to correctly locate fruit said by therapist and began nodding off again. Tried to engage pt in another sit<>stand to increase alertness but pt refused. Pt returned to room and was set-up for lunch with family present, safety belt on, and needs met.    Therapy Documentation Precautions:  Precautions Precautions: Fall Restrictions Weight Bearing Restrictions: No Pain:   denies pain See Function Navigator for Current Functional Status.   Therapy/Group: Individual Therapy  Valma Cava 02/23/2018, 1:02 PM

## 2018-02-24 ENCOUNTER — Inpatient Hospital Stay (HOSPITAL_COMMUNITY): Payer: Medicare Other | Admitting: Physical Therapy

## 2018-02-24 LAB — GLUCOSE, CAPILLARY
GLUCOSE-CAPILLARY: 154 mg/dL — AB (ref 65–99)
GLUCOSE-CAPILLARY: 467 mg/dL — AB (ref 65–99)
Glucose-Capillary: 146 mg/dL — ABNORMAL HIGH (ref 65–99)
Glucose-Capillary: 113 mg/dL — ABNORMAL HIGH (ref 65–99)
Glucose-Capillary: 336 mg/dL — ABNORMAL HIGH (ref 65–99)
Glucose-Capillary: 369 mg/dL — ABNORMAL HIGH (ref 65–99)

## 2018-02-24 MED ORDER — INSULIN ASPART 100 UNIT/ML ~~LOC~~ SOLN
12.0000 [IU] | Freq: Once | SUBCUTANEOUS | Status: AC
  Administered 2018-02-24: 12 [IU] via SUBCUTANEOUS

## 2018-02-24 NOTE — Progress Notes (Signed)
Pt has been sleeping throughout shift. Hasn't had much intake. meds havent been given due to pt unable to awaken. VS stable. No s&S pain noted. No distress breathing noted. Daughter came to visit this morning and left around 2. Called Dr Selina Cooley to notify of pt being sleepy. Order to hold ativan dose at 2000. Monitor for any changes.

## 2018-02-24 NOTE — Progress Notes (Signed)
Patient ID: [name], female   DOB: 1935-03-31, 83 y.o.   MRN: [id number]   [name] is a 83 y.o. female who is admitted for CIR with functional deficits secondary to a left thalamic stroke with a aphasia    Subjective: No new complaints.  Sleeping in a recliner.  Arousable but nonverbal  Past Medical History:  Diagnosis Date  . Atherosclerosis of autologous vein coronary artery bypass graft with unstable angina pectoris ([place])    CATH: 80% prox SVG-D1, 95% anastomotic SVG-D1 lesion  . CAD (coronary artery disease), native coronary artery 2004   a) 2/'70: Complicated PCI-of pLAD wtih PTCA  (failed redo PTCA) --> CABG 06/2003 for ISR LAD & D1 (LIMA-LAD, SVG-dRCA, SVG-D1);; b) 4/'07: Patent grafts, Normal RHC pressures; c) NSTEMI 9/'10: No culprit; 1/'12: recanalized LAD stent, patent grafts  . CAD S/P percutaneous coronary angioplasty 06/05/2014   a) 1/04: BMS x 2 LAD, PTCA D1 followed by unusuccessful redo PTCA D1 --> initially turned down CABG initially then CABG in 06/2003;; b) 05/2014: DES to Prox Cx & SVG-D1; PTCA of Anastomotic SVG-D1 lesion  . Cardiomyopathy secondary to chemotherapy ([place]) 07/2009   a) Combined Ischemic & Non-ischemic (Adriamycin from [facility])EF ~40% by Cath; by Echo in 08/2010 35-40%;; b) followup echo August 2013: EF 50-55%, aortic sclerosis without stenosis. Grade 1 diastolic dysfunction  . Chronic combined systolic and diastolic congestive heart failure, NYHA class 2 ([place]) 07/2009   Mixed. 2D-echocardiogram (08/2010) - 35% to 40%. Diffuse hypokinesis. Grade 2 diastolic dysfunction.  . CKD (chronic kidney disease) stage 2, GFR 60-89 ml/min    BL Cr 1.6  . Dementia   . DJD (degenerative joint disease) of knee    left  . DM (diabetes mellitus), type 2, uncontrolled with complications ([place]) DX: 3500   Stopped of Metformin in 04/2011 in setting of AKI (Cr 3), now on insulin.  . Essential hypertension 03/12/2012  . History of breast cancer    Primary oncologist  Dr. Brigitte Pulse. // First diagnosed 2000, recurrence in 06/2010. S/P left mastectomy with radiation therapy (2000), following recurrence, started chemothearpy, completed course (06/2010.)  . History of CVA (cerebrovascular accident) 11/2009; 8& 04/2009   left frontal lobe stroke (11/2009), TIA (06/2009, 04/2009) with multiple prior TIAs manifested by aphasia. H/o CVA in 1988 involving right eye.  [name] History of DVT of lower extremity 07/2003   in setting of saphenous vein grafting for CABG  . History of renal artery stenosis 2010   Left 95% stenosis s/p left renal artery stenting (09/2009), right renal artery with 50% stenosis.  [name] Hx of adenomatous colonic polyps    remote history. Last colonoscopy (03/2004) - Normal colonoscopy to the cecum, rec 5 year follow-up colonoscopy.  . Hyperlipidemia with target LDL less than 70   . Hypothyroidism   . S/P CABG x 3 06/2003   LIMA-LAD, SVG-D1, SVG-dRCA     Objective: Vital signs in last 24 hours: Temp:  [96.8 F (36 C)-97.8 F (36.6 C)] 97.8 F (36.6 C) (04/06 0300) Pulse Rate:  [66-73] 73 (04/06 0300) Resp:  [16-17] 17 (04/06 0300) BP: (103-116)/(62-63) 103/63 (04/06 0300) SpO2:  [98 %-99 %] 98 % (04/06 0300) Weight change:  Last BM Date: 02/23/18  Intake/Output from previous day: 04/05 0701 - 04/06 0700 In: 600 [P.O.:600] Out: -  Last cbgs: CBG (last 3)  Recent Labs    02/23/18 1704 02/23/18 2109 02/24/18 0717  GLUCAP 203* 191* 154*   Patient Vitals for the  past 24 hrs:  BP Temp Temp src Pulse Resp SpO2  02/24/18 0300 103/63 97.8 F (36.6 C) Oral 73 17 98 %  02/23/18 1415 116/62 (!) 96.8 F (36 C) Axillary 66 16 99 %    Physical Exam General: No apparent distress a phasic HEENT: not dry.  Not well visualized Lungs: Normal effort. Lungs clear to auscultation, no crackles or wheezes. Cardiovascular: Regular rate and rhythm, no edema Abdomen: S/NT/ND; BS(+) Musculoskeletal:  unchanged Neurological: No new neurological  deficits; aphasic with right-sided weakness Wounds: N/A    Extremities.  No edema Mental state: No distress; somnolent  CBG (last 3)  Recent Labs    02/23/18 1704 02/23/18 2109 02/24/18 0717  GLUCAP 203* 191* 154*     Lab Results: BMET    Component Value Date/Time   NA 136 02/22/2018 0504   K 4.4 02/22/2018 0504   CL 105 02/22/2018 0504   CO2 20 (L) 02/22/2018 0504   GLUCOSE 172 (H) 02/22/2018 0504   BUN 21 (H) 02/22/2018 0504   CREATININE 1.17 (H) 02/22/2018 0504   CALCIUM 9.3 02/22/2018 0504   GFRNONAA 43 (L) 02/22/2018 0504   GFRAA 50 (L) 02/22/2018 0504   CBC    Component Value Date/Time   WBC 9.6 02/20/2018 0440   RBC 4.25 02/20/2018 0440   HGB 13.7 02/20/2018 0440   HGB 10.9 (L) 01/25/2011 1256   HCT 40.4 02/20/2018 0440   HCT 32.0 (L) 01/25/2011 1256   PLT 204 02/20/2018 0440   PLT 206 01/25/2011 1256   MCV 95.1 02/20/2018 0440   MCV 90.5 01/25/2011 1256   MCH 32.2 02/20/2018 0440   MCHC 33.9 02/20/2018 0440   RDW 12.9 02/20/2018 0440   RDW 15.3 (H) 01/25/2011 1256   LYMPHSABS 2.5 02/20/2018 0440   LYMPHSABS 1.2 01/25/2011 1256   MONOABS 0.6 02/20/2018 0440   MONOABS 0.6 01/25/2011 1256   EOSABS 0.1 02/20/2018 0440   EOSABS 0.2 01/25/2011 1256   BASOSABS 0.0 02/20/2018 0440   BASOSABS 0.0 01/25/2011 1256    Medications: I have reviewed the patient's current medications.  Assessment/Plan:  Functional deficits secondary to left thalamic stroke.  Continue PT OT and SLP Essential hypertension.  Blood pressure low normal Diabetes mellitus.  Continue Lantus and mealtime coverage.  Fasting blood sugar 154 this morning   Length of stay, days: [place] , MD 02/24/2018, 10:46 AM

## 2018-02-24 NOTE — Progress Notes (Addendum)
Physical Therapy Session Note  Patient Details  Name: [name] MRN: [id number] Date of Birth: 1935/06/27  Today's Date: 02/24/2018 PT Individual Time: 0258-5277 PT Individual Time Calculation (min): 30 min  and Today's Date: 02/24/2018 PT Missed Time: 30 Minutes Missed Time Reason: Patient fatigue  Short Term Goals: Week 2:  PT Short Term Goal 1 (Week 2): STG=LTG due to ELOS  Skilled Therapeutic Interventions/Progress Updates:   Pt asleep in supine and difficult to arouse, attempted multiple times to arouse however pt opened eyes only a few times and vocalized non-fluent jargon occasionally. Transferred to EOB total assist and maintained static sitting w/ mod-max assist for postural control while providing w/ wet wash cloth to wash face and changing clothes. Max-total assist to don pants and shirt, tactile and verbal cues for initiation. Pt minimally initiating tasks. Transferred to recliner via total assist x1, pt assisting 10% of transfer and able to boost into standing once therapist provided manual cues to initiate. Transported to day room to attempt to arouse pt w/ breakfast, was unsuccessful. Returned to room and ended session in recliner, chair alarm activated and quick release belt engaged. Missed 30 min of skilled PT 2/2 fatigue. Will reattempt to make up time later this date.   Addendum: Unable to arouse patient at other attempts later in day. Per staff, has been sleeping most of day and has not been able to arouse enough to take medicines.  Therapy Documentation Precautions:  Precautions Precautions: Fall Restrictions Weight Bearing Restrictions: No  See Function Navigator for Current Functional Status.   Therapy/Group: Individual Therapy  [place] Austad K Arnette 02/24/2018, 8:24 AM

## 2018-02-24 NOTE — Progress Notes (Signed)
Pt's blood sugar was 369. No Novolog sliding scale for bedtime. Dr. Asa Lente informed with order to administer 12 units of Novolog x 1. Will monitor.

## 2018-02-25 ENCOUNTER — Inpatient Hospital Stay (HOSPITAL_COMMUNITY): Payer: Medicare Other | Admitting: Physical Therapy

## 2018-02-25 LAB — GLUCOSE, CAPILLARY
Glucose-Capillary: 137 mg/dL — ABNORMAL HIGH (ref 65–99)
Glucose-Capillary: 113 mg/dL — ABNORMAL HIGH (ref 65–99)
Glucose-Capillary: 258 mg/dL — ABNORMAL HIGH (ref 65–99)
Glucose-Capillary: 247 mg/dL — ABNORMAL HIGH (ref 65–99)
Glucose-Capillary: 223 mg/dL — ABNORMAL HIGH (ref 65–99)

## 2018-02-25 MED ORDER — LORAZEPAM 0.5 MG PO TABS
0.5000 mg | ORAL_TABLET | Freq: Every day | ORAL | Status: DC
  Administered 2018-02-25: 0.5 mg via ORAL
  Filled 2018-02-25: qty 1

## 2018-02-25 NOTE — Progress Notes (Signed)
Physical Therapy Session Note  Patient Details  Name: [name] MRN: [id number] Date of Birth: Sep 13, 1935  Today's Date: 02/25/2018 PT Individual Time: 0830(30 min make up from missed time previous date)-1000 PT Individual Time Calculation (min): 90 min   Short Term Goals: Week 2:  PT Short Term Goal 1 (Week 2): STG=LTG due to ELOS  Skilled Therapeutic Interventions/Progress Updates:   Pt in supine and seemed agreeable to therapy, no evidence of pain. Awake and more alert than previous date. Session focused on tolerance to upright/OOB activity and family education, daughter present halfway through session. Transferred to recliner via stand pivot w/ mod-max assist w/ manual and verbal cues for technique. Engaged in eating breakfast w/ max verbal, manual (hand-over-hand), and tactile cues to attend to task, sequencing, initiation, and safety. Pt able to initiate self-feeding w/ LUE if provided w/ spoon/fork ~50% of time, reached for drink ~50% of time w/o prompting using LUE, and required hand-over-hand assist for safety using RUE <25% of time. Successful w/ eating 1/3rd of breakfast w/ increased time and encouragement. Daughter present and continued conversation regarding home set-up and d/c plan, encouraged daughter [name]) to follow up w/ SW on Monday regarding plans w/ hired caregiver and she has questions regarding insurance approval for hospital bed and other DME. Instructed and educated daughter on safe ambulation assistance for short distance as we provided HHA x2 while ambulating to/from toilet in bathroom. Pt required mod assist overall from therapist for upright balance and initiation and lateral weight shifting during gait. Performed multiple sit<>stands while at toilet and successful w/ voiding w/ encouragement and increased time. No incontinence noted in brief. Educated [name] on changing brief in seated position for safety and appropriate verbal and manual cues for sit<>stands from  raised toilet set and usefulness of having 2nd helper to manage LE garments. Returned to supine and ended session in supine and in care of daughter, all needs met.   Therapy Documentation Precautions:  Precautions Precautions: Fall Restrictions Weight Bearing Restrictions: No Pain: Pain Assessment Pain Scale: 0-10 Pain Score: 0-No pain  See Function Navigator for Current Functional Status.   Therapy/Group: Individual Therapy  [name] K Arnette 02/25/2018, 12:23 PM

## 2018-02-25 NOTE — Plan of Care (Signed)
  Problem: RH BLADDER ELIMINATION Goal: RH STG MANAGE BLADDER WITH ASSISTANCE Description STG Manage Bladder With  Min Assistance  Outcome: Not Progressing Note:  Pt remains incontinent requiring max assist from staff   Problem: RH SAFETY Goal: RH STG ADHERE TO SAFETY PRECAUTIONS W/ASSISTANCE/DEVICE Description STG Adhere to Safety Precautions With  Mod cues/reminders/ Assistance/Device.   Outcome: Not Progressing Note:  Pt remains impulsive, telesitter utilized.Relocated to nurses station for close observation.

## 2018-02-25 NOTE — Progress Notes (Signed)
Patient ID: [name], female   DOB: 1935-08-10, 83 y.o.   MRN: [id number]   [name] is a 83 y.o. female  Who was admitted for CIR following a left thalamic stroke with secondary functional deficits and a aphasia  Subjective:  Patient was quite somnolent yesterday on morning rounds.  She remains somnolent through much of the day and lorazepam has been discontinued.  Today much more alert and eating breakfast with assistance  Objective: Vital signs in last 24 hours: Temp:  [97.8 F (36.6 C)] 97.8 F (36.6 C) (04/06 1409) Pulse Rate:  [67-70] 70 (04/07 0339) Resp:  [16] 16 (04/07 0339) BP: (102-146)/(68-83) 102/68 (04/07 0339) SpO2:  [100 %] 100 % (04/07 0339) Weight change:  Last BM Date: 02/23/18  Intake/Output from previous day: No intake/output data recorded. Last cbgs: CBG (last 3)  Recent Labs    02/24/18 2222 02/25/18 0235 02/25/18 0624  GLUCAP 369* 137* 113*     Physical Exam General: No apparent distress   HEENT: Poor dental hygiene Lungs: Normal effort. Lungs clear to auscultation, no crackles or wheezes. Cardiovascular: Regular rate and rhythm, no edema Abdomen: S/NT/ND; BS(+) Musculoskeletal:  unchanged Neurological: No new neurological deficits with right-sided weakness Wounds: N/A    Skin: clear  Aging changes Mental state: Alert, confused and slightly agitated    Lab Results: BMET    Component Value Date/Time   NA 136 02/22/2018 0504   K 4.4 02/22/2018 0504   CL 105 02/22/2018 0504   CO2 20 (L) 02/22/2018 0504   GLUCOSE 172 (H) 02/22/2018 0504   BUN 21 (H) 02/22/2018 0504   CREATININE 1.17 (H) 02/22/2018 0504   CALCIUM 9.3 02/22/2018 0504   GFRNONAA 43 (L) 02/22/2018 0504   GFRAA 50 (L) 02/22/2018 0504   CBC    Component Value Date/Time   WBC 9.6 02/20/2018 0440   RBC 4.25 02/20/2018 0440   HGB 13.7 02/20/2018 0440   HGB 10.9 (L) 01/25/2011 1256   HCT 40.4 02/20/2018 0440   HCT 32.0 (L) 01/25/2011 1256   PLT 204  02/20/2018 0440   PLT 206 01/25/2011 1256   MCV 95.1 02/20/2018 0440   MCV 90.5 01/25/2011 1256   MCH 32.2 02/20/2018 0440   MCHC 33.9 02/20/2018 0440   RDW 12.9 02/20/2018 0440   RDW 15.3 (H) 01/25/2011 1256   LYMPHSABS 2.5 02/20/2018 0440   LYMPHSABS 1.2 01/25/2011 1256   MONOABS 0.6 02/20/2018 0440   MONOABS 0.6 01/25/2011 1256   EOSABS 0.1 02/20/2018 0440   EOSABS 0.2 01/25/2011 1256   BASOSABS 0.0 02/20/2018 0440   BASOSABS 0.0 01/25/2011 1256    Medications: I have reviewed the patient's current medications.  Assessment/Plan:  Status post left thalamic infarct with functional deficits Somnolence.  Much improved with discontinuation of lorazepam.  Remains confused with slight agitation.  We will continue to observe off anxiolytics  Diabetes mellitus.  Blood sugars a bit labile but fasting blood sugar this morning 113.  Continue basal bolus insulin Seizure disorder status post stroke.  Continue Keppra   Length of stay, days: [place] , MD 02/25/2018, 9:34 AM

## 2018-02-26 ENCOUNTER — Inpatient Hospital Stay (HOSPITAL_COMMUNITY): Payer: Medicare Other

## 2018-02-26 ENCOUNTER — Inpatient Hospital Stay (HOSPITAL_COMMUNITY): Payer: Medicare Other | Admitting: Speech Pathology

## 2018-02-26 ENCOUNTER — Inpatient Hospital Stay (HOSPITAL_COMMUNITY): Payer: Medicare Other | Admitting: Occupational Therapy

## 2018-02-26 DIAGNOSIS — R569 Unspecified convulsions: Secondary | ICD-10-CM

## 2018-02-26 DIAGNOSIS — G8191 Hemiplegia, unspecified affecting right dominant side: Secondary | ICD-10-CM

## 2018-02-26 LAB — GLUCOSE, CAPILLARY
GLUCOSE-CAPILLARY: 141 mg/dL — AB (ref 65–99)
GLUCOSE-CAPILLARY: 182 mg/dL — AB (ref 65–99)
GLUCOSE-CAPILLARY: 279 mg/dL — AB (ref 65–99)
Glucose-Capillary: 133 mg/dL — ABNORMAL HIGH (ref 65–99)

## 2018-02-26 LAB — CBC
HEMATOCRIT: 43 % (ref 36.0–46.0)
HEMOGLOBIN: 14.6 g/dL (ref 12.0–15.0)
MCH: 31.7 pg (ref 26.0–34.0)
MCHC: 34 g/dL (ref 30.0–36.0)
MCV: 93.3 fL (ref 78.0–100.0)
Platelets: 262 10*3/uL (ref 150–400)
RBC: 4.61 MIL/uL (ref 3.87–5.11)
RDW: 12.8 % (ref 11.5–15.5)
WBC: 7.8 10*3/uL (ref 4.0–10.5)

## 2018-02-26 LAB — BASIC METABOLIC PANEL
ANION GAP: 11 (ref 5–15)
BUN: 25 mg/dL — ABNORMAL HIGH (ref 6–20)
CO2: 23 mmol/L (ref 22–32)
Calcium: 9.4 mg/dL (ref 8.9–10.3)
Chloride: 103 mmol/L (ref 101–111)
Creatinine, Ser: 1.4 mg/dL — ABNORMAL HIGH (ref 0.44–1.00)
GFR, EST AFRICAN AMERICAN: 40 mL/min — AB (ref >60)
GFR, EST NON AFRICAN AMERICAN: 34 mL/min — AB (ref >60)
Glucose, Bld: 126 mg/dL — ABNORMAL HIGH (ref 65–99)
POTASSIUM: 4.2 mmol/L (ref 3.5–5.1)
SODIUM: 137 mmol/L (ref 135–145)

## 2018-02-26 MED ORDER — SODIUM CHLORIDE 0.45 % IV SOLN
INTRAVENOUS | Status: DC
  Administered 2018-02-27 – 2018-03-01 (×3): via INTRAVENOUS

## 2018-02-26 MED ORDER — ALPRAZOLAM 0.25 MG PO TABS: 0.2500 mg | ORAL_TABLET | Freq: Every evening | ORAL | Status: DC | PRN

## 2018-02-26 MED ORDER — FLUCONAZOLE 100 MG PO TABS
100.0000 mg | ORAL_TABLET | Freq: Every day | ORAL | Status: DC
  Administered 2018-02-27: 100 mg via ORAL
  Filled 2018-02-26 (×2): qty 1

## 2018-02-26 MED ORDER — MICONAZOLE NITRATE 100 MG VA SUPP
100.0000 mg | Freq: Every day | VAGINAL | Status: DC
  Administered 2018-02-26: 100 mg via VAGINAL
  Filled 2018-02-26: qty 7

## 2018-02-26 MED ORDER — MEGESTROL ACETATE 400 MG/10ML PO SUSP
200.0000 mg | Freq: Two times a day (BID) | ORAL | Status: DC
  Administered 2018-02-26 – 2018-03-02 (×8): 200 mg via ORAL
  Filled 2018-02-26 (×10): qty 10

## 2018-02-26 NOTE — Progress Notes (Signed)
Social Work Patient ID: [name], female   DOB: 17-Dec-1934, 83 y.o.   MRN: [id number]  Spoke with [name]-daughter via telephone to discuss and schedule family education. Tomorrow at 2:00 with speech and one other therapy and on Wed at 3:00 with the one therapy did npt see on Tuesday. Concerns raised by other family members regarding taking pt home and if feasible. Will address with [name] when here tomorrow. May need to raise the hospice issue. See tomorrow afternoon.

## 2018-02-26 NOTE — Progress Notes (Signed)
Pt spouse, Jeneen Rinks and daughter, [name] would like to start the IV tomorrow, if need. Pt ate 30% lunch and 75% dinner. Pt left eye noted with some redness and itchiness. Daughter states there is some drainage. Staff will continue to monitor and meet needs.

## 2018-02-26 NOTE — Progress Notes (Signed)
Subjective/Complaints: Very somnolent this am, vitals stable, received Ativan yest pm, long discussion with pt's daughter regarding appetite, somnolence, agitation, cognition and speech as well as other issues    ROS- cannot obtain due to aphasia Objective: Vital Signs: Blood pressure 109/90, pulse 66, temperature 97.9 F (36.6 C), temperature source Oral, resp. rate 18, height 5' 3" (1.6 m), weight 60.1 kg (132 lb 7.9 oz), SpO2 100 %. No results found. Results for orders placed or performed during the hospital encounter of 02/13/18 (from the past 72 hour(s))  Glucose, capillary     Status: Abnormal   Collection Time: 02/23/18 11:49 AM  Result Value Ref Range   Glucose-Capillary 223 (H) 65 - 99 mg/dL  Glucose, capillary     Status: Abnormal   Collection Time: 02/23/18  5:04 PM  Result Value Ref Range   Glucose-Capillary 203 (H) 65 - 99 mg/dL  Glucose, capillary     Status: Abnormal   Collection Time: 02/23/18  9:09 PM  Result Value Ref Range   Glucose-Capillary 191 (H) 65 - 99 mg/dL  Glucose, capillary     Status: Abnormal   Collection Time: 02/24/18  7:17 AM  Result Value Ref Range   Glucose-Capillary 154 (H) 65 - 99 mg/dL  Glucose, capillary     Status: Abnormal   Collection Time: 02/24/18 11:30 AM  Result Value Ref Range   Glucose-Capillary 146 (H) 65 - 99 mg/dL  Glucose, capillary     Status: Abnormal   Collection Time: 02/24/18  4:45 PM  Result Value Ref Range   Glucose-Capillary 113 (H) 65 - 99 mg/dL   Comment 1 Notify RN   Glucose, capillary     Status: Abnormal   Collection Time: 02/24/18  9:09 PM  Result Value Ref Range   Glucose-Capillary 336 (H) 65 - 99 mg/dL  Glucose, capillary     Status: Abnormal   Collection Time: 02/24/18  9:10 PM  Result Value Ref Range   Glucose-Capillary 467 (H) 65 - 99 mg/dL  Glucose, capillary     Status: Abnormal   Collection Time: 02/24/18 10:22 PM  Result Value Ref Range   Glucose-Capillary 369 (H) 65 - 99 mg/dL  Glucose,  capillary     Status: Abnormal   Collection Time: 02/25/18  2:35 AM  Result Value Ref Range   Glucose-Capillary 137 (H) 65 - 99 mg/dL  Glucose, capillary     Status: Abnormal   Collection Time: 02/25/18  6:24 AM  Result Value Ref Range   Glucose-Capillary 113 (H) 65 - 99 mg/dL  Glucose, capillary     Status: Abnormal   Collection Time: 02/25/18 11:49 AM  Result Value Ref Range   Glucose-Capillary 258 (H) 65 - 99 mg/dL   Comment 1 Notify RN   Glucose, capillary     Status: Abnormal   Collection Time: 02/25/18  4:43 PM  Result Value Ref Range   Glucose-Capillary 247 (H) 65 - 99 mg/dL  Glucose, capillary     Status: Abnormal   Collection Time: 02/25/18  9:11 PM  Result Value Ref Range   Glucose-Capillary 223 (H) 65 - 99 mg/dL   Comment 1 Notify RN   Basic metabolic panel     Status: Abnormal   Collection Time: 02/26/18  5:24 AM  Result Value Ref Range   Sodium 137 135 - 145 mmol/L   Potassium 4.2 3.5 - 5.1 mmol/L   Chloride 103 101 - 111 mmol/L   CO2 23 22 - 32 mmol/L     Glucose, Bld 126 (H) 65 - 99 mg/dL   BUN 25 (H) 6 - 20 mg/dL   Creatinine, Ser 1.40 (H) 0.44 - 1.00 mg/dL   Calcium 9.4 8.9 - 10.3 mg/dL   GFR calc non Af Amer 34 (L) >60 mL/min   GFR calc Af Amer 40 (L) >60 mL/min    Comment: (NOTE) The eGFR has been calculated using the CKD EPI equation. This calculation has not been validated in all clinical situations. eGFR's persistently <60 mL/min signify possible Chronic Kidney Disease.    Anion gap 11 5 - 15    Comment: Performed at [facility], [address] [place], [place] [address]  CBC     Status: None   Collection Time: 02/26/18  5:24 AM  Result Value Ref Range   WBC 7.8 4.0 - 10.5 K/uL   RBC 4.61 3.87 - 5.11 MIL/uL   Hemoglobin 14.6 12.0 - 15.0 g/dL   HCT 43.0 36.0 - 46.0 %   MCV 93.3 78.0 - 100.0 fL   MCH 31.7 26.0 - 34.0 pg   MCHC 34.0 30.0 - 36.0 g/dL   RDW 12.8 11.5 - 15.5 %   Platelets 262 150 - 400 K/uL    Comment: Performed at [facility], [address] [place], [place] [address]  Glucose, capillary     Status: Abnormal   Collection Time: 02/26/18  6:16 AM  Result Value Ref Range   Glucose-Capillary 133 (H) 65 - 99 mg/dL     HEENT: normal Cardio: RRR and no murmur Resp: CTA B/L and no wheezes GI: BS positive and NT, ND Extremity:  No Edema Skin:   Bruise forearms Neuro: Abnormal Sensory reduced sensation to pinch Right Upper and Lower limb, Abnormal Motor 3/5 RUE and RLE, Abnormal FMC Ataxic/ dec FMC and Aphasic Musc/Skel:  Other no pain with UE or LE ROM Gen NAD   Assessment/Plan: 1. Functional deficits secondary to Left thalamic and Left parietal infarct which require 3+ hours per day of interdisciplinary therapy in a comprehensive inpatient rehab setting. Physiatrist is providing close team supervision and 24 hour management of active medical problems listed below. Physiatrist and rehab team continue to assess barriers to discharge/monitor patient progress toward functional and medical goals. FIM: Function - Bathing Position: Shower Body parts bathed by patient: Right arm, Chest, Left upper leg Body parts bathed by helper: Left arm, Abdomen, Front perineal area, Buttocks, Right upper leg, Right lower leg, Left lower leg, Back Bathing not applicable: Buttocks Assist Level: Touching or steadying assistance(Pt > 75%)  Function- Upper Body Dressing/Undressing What is the patient wearing?: Pull over shirt/dress Pull over shirt/dress - Perfomed by patient: Thread/unthread right sleeve, Thread/unthread left sleeve, Put head through opening, Pull shirt over trunk Pull over shirt/dress - Perfomed by helper: Thread/unthread right sleeve, Thread/unthread left sleeve, Put head through opening, Pull shirt over trunk Assist Level: 2 helpers Function - Lower Body Dressing/Undressing What is the patient wearing?: Non-skid slipper socks Position: Wheelchair/chair at sink Non-skid slipper socks- Performed by  patient: Don/doff right sock Non-skid slipper socks- Performed by helper: Don/doff right sock, Don/doff left sock Assist for footwear: Dependant Assist for lower body dressing: Touching or steadying assistance (Pt > 75%)  Function - Toileting Toileting activity did not occur: No continent bowel/bladder event Toileting steps completed by patient: Performs perineal hygiene Toileting steps completed by helper: Adjust clothing prior to toileting, Performs perineal hygiene, Adjust clothing after toileting Toileting Assistive Devices: Grab bar or rail Assist   level: Two helpers  Function - Toilet Transfers Toilet transfer activity did not occur: Safety/medical concerns Toilet transfer assistive device: Elevated toilet seat/BSC over toilet Assist level to toilet: 2 helpers Assist level from toilet: 2 helpers Assist level to bedside commode (at bedside): Maximal assist (Pt 25 - 49%/lift and lower) Assist level from bedside commode (at bedside): Maximal assist (Pt 25 - 49%/lift and lower)  Function - Chair/bed transfer Chair/bed transfer method: Stand pivot Chair/bed transfer assist level: Maximal assist (Pt 25 - 49%/lift and lower) Chair/bed transfer assistive device: Armrests, Bedrails Chair/bed transfer details: Manual facilitation for placement, Manual facilitation for weight shifting, Tactile cues for sequencing, Tactile cues for initiation, Tactile cues for placement, Verbal cues for sequencing, Verbal cues for technique, Manual facilitation for weight bearing, Verbal cues for precautions/safety, Tactile cues for posture  Function - Locomotion: Wheelchair Will patient use wheelchair at discharge?: No Assist Level: Dependent (Pt equals 0%)(for transport) Function - Locomotion: Ambulation Assistive device: Hand held assist Max distance: 15' Assist level: 2 helpers Assist level: 2 helpers Walk 50 feet with 2 turns activity did not occur: Safety/medical concerns Assist level: 2  helpers Walk 150 feet activity did not occur: Safety/medical concerns Walk 10 feet on uneven surfaces activity did not occur: Safety/medical concerns  Function - Comprehension Comprehension: Auditory Comprehension assist level: Understands basic less than 25% of the time/ requires cueing >75% of the time  Function - Expression Expression: Verbal Expression assist level: Expresses basis less than 25% of the time/requires cueing >75% of the time.  Function - Social Interaction Social Interaction assist level: Interacts appropriately less than 25% of the time. May be withdrawn or combative.  Function - Problem Solving Problem solving assist level: Solves basic less than 25% of the time - needs direction nearly all the time or does not effectively solve problems and may need a restraint for safety  Function - Memory Memory assist level: Recognizes or recalls less than 25% of the time/requires cueing greater than 75% of the time Patient normally able to recall (first 3 days only): None of the above  Medical Problem List and Plan:  1. Functional deficits secondary to infarcts in left thalamus and left parietal periventricular white matter  -admit to inpatient rehab  PT, OT, SLP CIR level,2. DVT Prophylaxis/Anticoagulation: Pharmaceutical: Lovenox and Other (comment)  3. Pain Management: tylenol prn  4. Mood: Patient currently with poor awareness of deficits. LCSW to follow for evaluation when appropriate.  -pt has demonstrating increased confusion and agitation at night, poor sleep patterns  -check sleep chart  -begin scheduled, low dose seroquel  5. Neuropsych: This patient is not capable of making decisions on her own behalf.  6. Skin/Wound Care: routine pressure relief measures  7. Fluids/Electrolytes/Nutrition: encourage fluids, IVF overnite  Discussed Palliative consult if appetite not improving  8. Chronic combined systolic/diastolic CHF/chemo induced CM: Heart healthy diet. Monitor  for signs of overload and check weights daily. BB discontinued due to bradycardia. Continue Lisinopril daily. Used lasix prn edema at home.  Vitals:   02/25/18 1634 02/26/18 0228  BP: 132/61 109/90  Pulse: 63 66  Resp: 16 18  Temp:  97.9 F (36.6 C)  SpO2: 99% 100%  BP controlled 4/8 9. CAD w/p PTCA: On ASA and Zetia.  10. CKD: Avoid nephrotoxic medications. Monitor with serial checks.  11.T2DM: Hgb A1C- 8.6. Blood sugars poorly controlled.--Used Lantus 80 units with metformin at nights.. Monitor BS ac/hs.fair control 4/3, increase lantus to 30U, improved CBG (last 3)  Recent   Labs    02/25/18 1643 02/25/18 2111 02/26/18 0616  GLUCAP 247* 223* 133*  12. Hypothyroid: On supplement.   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  Low grade temp ACharlett Blake4/06/2018, 8:46 AM

## 2018-02-26 NOTE — Progress Notes (Signed)
Speech Language Pathology Daily Session Note  Patient Details  Name: [name] MRN: [id number] Date of Birth: [birth year]  Today's Date: 02/26/2018 SLP Individual Time: 1245-8099 SLP Individual Time Calculation (min): 45 min  Short Term Goals: Week 2: SLP Short Term Goal 1 (Week 2): Patient will identify functional items from a field of 2 with 50% accuracy with Max A multimodal cues.  SLP Short Term Goal 2 (Week 2): Patient will answer basic yes/no questions in regards to biographical information with 50% accuracy and Max A multimodal cues.  SLP Short Term Goal 3 (Week 2): Patient will follow basic 1-step directions during functional tasks with Max A multimodal cues.  SLP Short Term Goal 4 (Week 2): Patient will complete basic and functional problem solving tasks with Max A multimodal cues.  SLP Short Term Goal 5 (Week 2): Patient will demonstrate sustained attention for 5-minute intervals during functional tasks with Max A verbal cues.  SLP Short Term Goal 6 (Week 2): Patient will self-monitor verbal errors x2 within a 45-minute session with Max A multimodal cues.   Skilled Therapeutic Interventions: Skilled treatment session focused on cognitive-linguistic goals. Upon arrival, pt was asleep while supine in bed with visitor present. SLP educated visitor and daughter who was on the phone in regards to pt's current deficits and goals of skilled SLP intervention to maximize overall functional independence. All questions were answered to their satisfaction at this time. Pt extremely lethargic and required Total A physical and verbal stimulation for arousal; however, pt without any response. Pt's vitals checked and all WNL. MD present and applied noxious stimuli that pt withdrew from; therefore, pt with increased arousal. Pt was transferred from bed to recliner with total +2 assist for safety to maximize alertness. Throughout session, pt demonstrated decreased verbal output due to lethargy; however,  able to verbalize basic wants/needs x1. Pt consumed minimal amounts of thin liquids with Max A verbal and tactile cues for problem solving in regards to holding the cup. Pt left upright in recliner with chair alarm on, quick release belt in place, and RN present. Continue with current plan of care.      Function:  Cognition Comprehension Comprehension assist level: Understands basic less than 25% of the time/ requires cueing >75% of the time  Expression   Expression assist level: Expresses basis less than 25% of the time/requires cueing >75% of the time.  Social Interaction Social Interaction assist level: Interacts appropriately less than 25% of the time. May be withdrawn or combative.  Problem Solving Problem solving assist level: Solves basic less than 25% of the time - needs direction nearly all the time or does not effectively solve problems and may need a restraint for safety  Memory Memory assist level: Recognizes or recalls less than 25% of the time/requires cueing greater than 75% of the time    Pain Pain Assessment Pain Score: 0-No pain  Therapy/Group: Individual Therapy  Meredeth Ide  SLP - Student 02/26/2018, 10:00 AM

## 2018-02-26 NOTE — Progress Notes (Signed)
Physical Therapy Session Note  Patient Details  Name: [name] MRN: [id number] Date of Birth: March 01, 1935  Today's Date: 02/26/2018 PT Individual Time: 1300-1410 PT Individual Time Calculation (min): 70 min   Short Term Goals: Week 2:  PT Short Term Goal 1 (Week 2): STG=LTG due to ELOS  Skilled Therapeutic Interventions/Progress Updates:    Pt sitting up in bed with RN upon PT arrival. Pt requiring verbal/tactile stimulation to stay alert. Pt working on eating and taking medication with stimulation and cues as needed. Pt transferred from supine>sitting EOB with total assist. Pt performed stand pivot transfer from bed>w/c with max assist +2. Pt sitting in w/c at sink required max assist to wash face and brush hair. Pt performed stand pivot transfer with max assist +2 from w/c<>toilet, +2 assist for standing balance and clothing management. Pt transported outside, worked on attention, pt continues to remain lethargic and falling back to sleep. Pt taken back to room and transferred back to bed, max assist +2. Pt transferred to supine with max assist, left supine in care of family. Educated pt's son and daughter in law regarding current status.    Therapy Documentation Precautions:  Precautions Precautions: Fall Restrictions Weight Bearing Restrictions: No   See Function Navigator for Current Functional Status.   Therapy/Group: Individual Therapy  Netta Corrigan, PT, DPT 02/26/2018, 7:50 AM

## 2018-02-26 NOTE — Progress Notes (Signed)
Occupational Therapy Session Note  Patient Details  Name: [name] MRN: [id number] Date of Birth: [birth year]  Today's Date: 02/26/2018 OT Individual Time: 1105-1200 OT Individual Time Calculation (min): 55 min   Short Term Goals: Week 2:  OT Short Term Goal 1 (Week 2): Pt will safely transfer from w/c to toilet with consistent mod A OT Short Term Goal 2 (Week 2): Pt will complete oral hygiene with mod multimodal cues OT Short Term Goal 3 (Week 2): Pt will complete seated level LB dressing with mod  Skilled Therapeutic Interventions/Progress Updates:    OT treatment session focused on family education. Pt asleep in recliner through most of session. Attempted to wake pt by changing position of recliner and environmental changes, but unsuccessful.  Total A to wash face sitting in recliner. Discussed with pt's son and daughter-in law  OT goals and lack of progression with therapy. Pt's son has concerns about pt going home and getting the care she needs. Inquired about what a SNF could offer pt and educated provided education.  Also re-iterated thatt this would have to be a family decision and could not be made by staff here. Social work has set-up family training this week and well will continue to educate family on caring for pt, transfers, and ADLs if she is to go home. Son request pt return to bed so OT transferred pt back to bed with total A. Pt left semi-reclined in bed with needs met with family present.   Therapy Documentation Precautions:  Precautions Precautions: Fall Restrictions Weight Bearing Restrictions: No Pain: Pain Assessment Pain Score: 0-No pain  See Function Navigator for Current Functional Status.   Therapy/Group: Individual Therapy  Valma Cava 02/26/2018, 12:26 PM

## 2018-02-26 NOTE — Progress Notes (Signed)
SLP Cancellation Note  Patient Details Name: [name] MRN: [id number] DOB: 08-Dec-1934   Cancelled treatment:       Patient missed 30 minutes of skilled SLP intervention due to fatigue and lethargy. Will re-attempt as able.                                                                                                 Ryder Man 02/26/2018, 2:39 PM

## 2018-02-27 ENCOUNTER — Inpatient Hospital Stay (HOSPITAL_COMMUNITY): Payer: Medicare Other | Admitting: Physical Therapy

## 2018-02-27 ENCOUNTER — Inpatient Hospital Stay (HOSPITAL_COMMUNITY): Payer: Medicare Other | Admitting: Speech Pathology

## 2018-02-27 ENCOUNTER — Inpatient Hospital Stay (HOSPITAL_COMMUNITY): Payer: Medicare Other

## 2018-02-27 ENCOUNTER — Inpatient Hospital Stay (HOSPITAL_COMMUNITY): Payer: Medicare Other | Admitting: Occupational Therapy

## 2018-02-27 LAB — GLUCOSE, CAPILLARY
Glucose-Capillary: 189 mg/dL — ABNORMAL HIGH (ref 65–99)
Glucose-Capillary: 292 mg/dL — ABNORMAL HIGH (ref 65–99)
Glucose-Capillary: 188 mg/dL — ABNORMAL HIGH (ref 65–99)

## 2018-02-27 MED ORDER — NITROGLYCERIN 0.4 MG SL SUBL: 0.4000 mg | SUBLINGUAL_TABLET | SUBLINGUAL | 12 refills | Status: AC | PRN

## 2018-02-27 MED ORDER — LEVETIRACETAM 250 MG PO TABS
250.0000 mg | ORAL_TABLET | Freq: Two times a day (BID) | ORAL | Status: DC
  Administered 2018-02-27 – 2018-03-02 (×6): 250 mg via ORAL
  Filled 2018-02-27 (×7): qty 1

## 2018-02-27 MED ORDER — BISACODYL 10 MG RE SUPP
10.0000 mg | Freq: Every day | RECTAL | Status: DC | PRN
  Administered 2018-02-28: 10 mg via RECTAL
  Filled 2018-02-27: qty 1

## 2018-02-27 MED ORDER — FLUCONAZOLE 100 MG PO TABS
100.0000 mg | ORAL_TABLET | Freq: Once | ORAL | Status: AC
  Administered 2018-03-01: 100 mg via ORAL
  Filled 2018-02-27: qty 1

## 2018-02-27 MED ORDER — SENNA 8.6 MG PO TABS
2.0000 | ORAL_TABLET | Freq: Every day | ORAL | Status: DC
  Administered 2018-02-27 – 2018-03-02 (×4): 17.2 mg via ORAL
  Filled 2018-02-27 (×4): qty 2

## 2018-02-27 MED ORDER — ALPRAZOLAM 0.25 MG PO TABS: 0.1250 mg | ORAL_TABLET | Freq: Every evening | ORAL | Status: DC | PRN

## 2018-02-27 MED ORDER — KETOTIFEN FUMARATE 0.025 % OP SOLN
1.0000 [drp] | Freq: Two times a day (BID) | OPHTHALMIC | Status: DC
  Administered 2018-02-27 – 2018-03-02 (×7): 1 [drp] via OPHTHALMIC
  Filled 2018-02-27 (×2): qty 5

## 2018-02-27 MED ORDER — INSULIN GLARGINE 100 UNIT/ML ~~LOC~~ SOLN
35.0000 [IU] | Freq: Every day | SUBCUTANEOUS | Status: DC
  Administered 2018-02-27 – 2018-03-01 (×3): 35 [IU] via SUBCUTANEOUS
  Filled 2018-02-27 (×3): qty 0.35

## 2018-02-27 MED ORDER — FLUCONAZOLE 100 MG PO TABS: 100.0000 mg | ORAL_TABLET | Freq: Once | ORAL | Status: DC

## 2018-02-27 NOTE — Progress Notes (Signed)
Subjective/Complaints: Remains aphasic, per family hx of dementia     ROS- cannot obtain due to aphasia Objective: Vital Signs: Blood pressure 101/90, pulse 69, temperature 97.6 F (36.4 C), temperature source Axillary, resp. rate 18, height 5' 3" (1.6 m), weight 60.1 kg (132 lb 7.9 oz), SpO2 100 %. No results found. Results for orders placed or performed during the hospital encounter of 02/13/18 (from the past 72 hour(s))  Glucose, capillary     Status: Abnormal   Collection Time: 02/24/18 11:30 AM  Result Value Ref Range   Glucose-Capillary 146 (H) 65 - 99 mg/dL  Glucose, capillary     Status: Abnormal   Collection Time: 02/24/18  4:45 PM  Result Value Ref Range   Glucose-Capillary 113 (H) 65 - 99 mg/dL   Comment 1 Notify RN   Glucose, capillary     Status: Abnormal   Collection Time: 02/24/18  9:09 PM  Result Value Ref Range   Glucose-Capillary 336 (H) 65 - 99 mg/dL  Glucose, capillary     Status: Abnormal   Collection Time: 02/24/18  9:10 PM  Result Value Ref Range   Glucose-Capillary 467 (H) 65 - 99 mg/dL  Glucose, capillary     Status: Abnormal   Collection Time: 02/24/18 10:22 PM  Result Value Ref Range   Glucose-Capillary 369 (H) 65 - 99 mg/dL  Glucose, capillary     Status: Abnormal   Collection Time: 02/25/18  2:35 AM  Result Value Ref Range   Glucose-Capillary 137 (H) 65 - 99 mg/dL  Glucose, capillary     Status: Abnormal   Collection Time: 02/25/18  6:24 AM  Result Value Ref Range   Glucose-Capillary 113 (H) 65 - 99 mg/dL  Glucose, capillary     Status: Abnormal   Collection Time: 02/25/18 11:49 AM  Result Value Ref Range   Glucose-Capillary 258 (H) 65 - 99 mg/dL   Comment 1 Notify RN   Glucose, capillary     Status: Abnormal   Collection Time: 02/25/18  4:43 PM  Result Value Ref Range   Glucose-Capillary 247 (H) 65 - 99 mg/dL  Glucose, capillary     Status: Abnormal   Collection Time: 02/25/18  9:11 PM  Result Value Ref Range   Glucose-Capillary 223 (H)  65 - 99 mg/dL   Comment 1 Notify RN   Basic metabolic panel     Status: Abnormal   Collection Time: 02/26/18  5:24 AM  Result Value Ref Range   Sodium 137 135 - 145 mmol/L   Potassium 4.2 3.5 - 5.1 mmol/L   Chloride 103 101 - 111 mmol/L   CO2 23 22 - 32 mmol/L   Glucose, Bld 126 (H) 65 - 99 mg/dL   BUN 25 (H) 6 - 20 mg/dL   Creatinine, Ser 1.40 (H) 0.44 - 1.00 mg/dL   Calcium 9.4 8.9 - 10.3 mg/dL   GFR calc non Af Amer 34 (L) >60 mL/min   GFR calc Af Amer 40 (L) >60 mL/min    Comment: (NOTE) The eGFR has been calculated using the CKD EPI equation. This calculation has not been validated in all clinical situations. eGFR's persistently <60 mL/min signify possible Chronic Kidney Disease.    Anion gap 11 5 - 15    Comment: Performed at [facility] [address], [place], [place] [address]  CBC     Status: None   Collection Time: 02/26/18  5:24 AM  Result Value Ref Range   WBC 7.8 4.0 -  10.5 K/uL   RBC 4.61 3.87 - 5.11 MIL/uL   Hemoglobin 14.6 12.0 - 15.0 g/dL   HCT 43.0 36.0 - 46.0 %   MCV 93.3 78.0 - 100.0 fL   MCH 31.7 26.0 - 34.0 pg   MCHC 34.0 30.0 - 36.0 g/dL   RDW 12.8 11.5 - 15.5 %   Platelets 262 150 - 400 K/uL    Comment: Performed at [facility], [place] [address] [place], [place] [address]  Glucose, capillary     Status: Abnormal   Collection Time: 02/26/18  6:16 AM  Result Value Ref Range   Glucose-Capillary 133 (H) 65 - 99 mg/dL  Glucose, capillary     Status: Abnormal   Collection Time: 02/26/18 11:52 AM  Result Value Ref Range   Glucose-Capillary 141 (H) 65 - 99 mg/dL  Glucose, capillary     Status: Abnormal   Collection Time: 02/26/18  4:17 PM  Result Value Ref Range   Glucose-Capillary 182 (H) 65 - 99 mg/dL   Comment 1 Notify RN   Glucose, capillary     Status: Abnormal   Collection Time: 02/26/18  9:29 PM  Result Value Ref Range   Glucose-Capillary 279 (H) 65 - 99 mg/dL  Glucose, capillary     Status: Abnormal   Collection Time:  02/27/18  6:38 AM  Result Value Ref Range   Glucose-Capillary 189 (H) 65 - 99 mg/dL     HEENT: normal Cardio: RRR and no murmur Resp: CTA B/L and no wheezes GI: BS positive and NT, ND Extremity:  No Edema Skin:   Bruise forearms Neuro: Abnormal Sensory reduced sensation to pinch Right Upper and Lower limb, Abnormal Motor 3/5 RUE and RLE, Abnormal FMC Ataxic/ dec FMC and Aphasic Musc/Skel:  Other no pain with UE or LE ROM Gen NAD   Assessment/Plan: 1. Functional deficits secondary to Left thalamic and Left parietal infarct which require 3+ hours per day of interdisciplinary therapy in a comprehensive inpatient rehab setting. Physiatrist is providing close team supervision and 24 hour management of active medical problems listed below. Physiatrist and rehab team continue to assess barriers to discharge/monitor patient progress toward functional and medical goals. FIM: Function - Bathing Position: Shower Body parts bathed by patient: Right arm, Chest, Left upper leg Body parts bathed by helper: Left arm, Abdomen, Front perineal area, Buttocks, Right upper leg, Right lower leg, Left lower leg, Back Bathing not applicable: Buttocks Assist Level: Touching or steadying assistance(Pt > 75%)  Function- Upper Body Dressing/Undressing What is the patient wearing?: Pull over shirt/dress Pull over shirt/dress - Perfomed by patient: Thread/unthread right sleeve, Thread/unthread left sleeve, Put head through opening, Pull shirt over trunk Pull over shirt/dress - Perfomed by helper: Thread/unthread right sleeve, Thread/unthread left sleeve, Put head through opening, Pull shirt over trunk Assist Level: 2 helpers Function - Lower Body Dressing/Undressing What is the patient wearing?: Non-skid slipper socks Position: Wheelchair/chair at sink Non-skid slipper socks- Performed by patient: Don/doff right sock Non-skid slipper socks- Performed by helper: Don/doff right sock, Don/doff left sock Assist  for footwear: Dependant Assist for lower body dressing: Touching or steadying assistance (Pt > 75%)  Function - Toileting Toileting activity did not occur: No continent bowel/bladder event Toileting steps completed by patient: Performs perineal hygiene Toileting steps completed by helper: Adjust clothing prior to toileting, Performs perineal hygiene, Adjust clothing after toileting Toileting Assistive Devices: Grab bar or rail Assist level: Two helpers  Function - Air cabin crew transfer activity did not  occur: Safety/medical concerns Toilet transfer assistive device: Elevated toilet seat/BSC over toilet Assist level to toilet: 2 helpers Assist level from toilet: 2 helpers Assist level to bedside commode (at bedside): Maximal assist (Pt 25 - 49%/lift and lower) Assist level from bedside commode (at bedside): Maximal assist (Pt 25 - 49%/lift and lower)  Function - Chair/bed transfer Chair/bed transfer method: Stand pivot Chair/bed transfer assist level: 2 helpers Chair/bed transfer assistive device: Armrests, Bedrails Chair/bed transfer details: Manual facilitation for placement, Manual facilitation for weight shifting, Tactile cues for sequencing, Tactile cues for initiation, Tactile cues for placement, Verbal cues for sequencing, Verbal cues for technique, Manual facilitation for weight bearing, Verbal cues for precautions/safety, Tactile cues for posture  Function - Locomotion: Wheelchair Will patient use wheelchair at discharge?: No Assist Level: Dependent (Pt equals 0%)(for transport) Function - Locomotion: Ambulation Assistive device: Hand held assist Max distance: 70' Assist level: 2 helpers Assist level: 2 helpers Walk 50 feet with 2 turns activity did not occur: Safety/medical concerns Assist level: 2 helpers Walk 150 feet activity did not occur: Safety/medical concerns Walk 10 feet on uneven surfaces activity did not occur: Safety/medical concerns  Function -  Comprehension Comprehension: Auditory Comprehension assist level: Understands basic less than 25% of the time/ requires cueing >75% of the time  Function - Expression Expression: Verbal Expression assist level: Expresses basis less than 25% of the time/requires cueing >75% of the time.  Function - Social Interaction Social Interaction assist level: Interacts appropriately less than 25% of the time. May be withdrawn or combative.  Function - Problem Solving Problem solving assist level: Solves basic less than 25% of the time - needs direction nearly all the time or does not effectively solve problems and may need a restraint for safety  Function - Memory Memory assist level: Recognizes or recalls less than 25% of the time/requires cueing greater than 75% of the time Patient normally able to recall (first 3 days only): None of the above  Medical Problem List and Plan:  1. Functional deficits secondary to infarcts in left thalamus and left parietal periventricular white matter  -admit to inpatient rehab  PT, OT, SLP CIR level,2. DVT Prophylaxis/Anticoagulation: Pharmaceutical: Lovenox and Other (comment)  3. Pain Management: tylenol prn  4. Mood: Patient currently with poor awareness of deficits. LCSW to follow for evaluation when appropriate.  -Trial of amantadine for arousal not helpful have d/ced -check sleep chart  -begin scheduled, low dose seroquel  5. Neuropsych: This patient is not capable of making decisions on her own behalf.  6. Skin/Wound Care: routine pressure relief measures  7. Fluids/Electrolytes/Nutrition: encourage fluids, IVF overnite  Discussed Palliative consult if appetite not improving- now on Megace trial  8. Chronic combined systolic/diastolic CHF/chemo induced CM: Heart healthy diet. Monitor for signs of overload and check weights daily. BB discontinued due to bradycardia. Continue Lisinopril daily. Used lasix prn edema at home.  Vitals:   02/26/18 1445 02/27/18  0300  BP: 113/63 101/90  Pulse: 72 69  Resp: 17 18  Temp: 98.4 F (36.9 C) 97.6 F (36.4 C)  SpO2: 99% 100%  BP controlled 4/8 9. CAD w/p PTCA: On ASA and Zetia.  10. CKD: Avoid nephrotoxic medications. Monitor with serial checks.  11.T2DM: Hgb A1C- 8.6. Blood sugars poorly controlled.--Used Lantus 80 units with metformin at nights.. Monitor BS ac/hs.elevated am CBG increase Lantus to 35U CBG (last 3)  Recent Labs    02/26/18 1617 02/26/18 2129 02/27/18 0638  GLUCAP 182* 279* 189*  12.  Hypothyroid: On supplement.   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

## 2018-02-27 NOTE — Progress Notes (Signed)
Speech Language Pathology Daily Session Note  Patient Details  Name: [name] MRN: [id number] Date of Birth: 1935/07/01  Today's Date: 02/27/2018 SLP Individual Time: 1345-1430 SLP Individual Time Calculation (min): 45 min  Short Term Goals: Week 2: SLP Short Term Goal 1 (Week 2): Patient will identify functional items from a field of 2 with 50% accuracy with Max A multimodal cues.  SLP Short Term Goal 2 (Week 2): Patient will answer basic yes/no questions in regards to biographical information with 50% accuracy and Max A multimodal cues.  SLP Short Term Goal 3 (Week 2): Patient will follow basic 1-step directions during functional tasks with Max A multimodal cues.  SLP Short Term Goal 4 (Week 2): Patient will complete basic and functional problem solving tasks with Max A multimodal cues.  SLP Short Term Goal 5 (Week 2): Patient will demonstrate sustained attention for 5-minute intervals during functional tasks with Max A verbal cues.  SLP Short Term Goal 6 (Week 2): Patient will self-monitor verbal errors x2 within a 45-minute session with Max A multimodal cues.   Skilled Therapeutic Interventions: Skilled treatment session focused on completion of patient and family education with the patient's husband, daughter, and a variety of family members. SLP educated family in regards to patient's current cognitive-linguistic functioning and strategies to utilize to maximize basic auditory comprehension, verbal expression in regards to yes/no responses and use of multimodal communication into express wants/needs. Family was also educated on the importance of 24 hour supervision and utilizing a routine in order to maximize the patient's overall safety. All verbalized understanding of information and all questions were answered to their satisfaction at this time. Patient left supine in bed with family present. Continue with current plan of care.      Function:   Cognition Comprehension  Comprehension assist level: Understands basic 25 - 49% of the time/ requires cueing 50 - 75% of the time  Expression   Expression assist level: Expresses basis less than 25% of the time/requires cueing >75% of the time.  Social Interaction Social Interaction assist level: Interacts appropriately less than 25% of the time. May be withdrawn or combative.  Problem Solving Problem solving assist level: Solves basic less than 25% of the time - needs direction nearly all the time or does not effectively solve problems and may need a restraint for safety  Memory Memory assist level: Recognizes or recalls less than 25% of the time/requires cueing greater than 75% of the time    Pain No/Denies Pain   Therapy/Group: Individual Therapy  Maansi Wike 02/27/2018, 3:11 PM

## 2018-02-27 NOTE — Progress Notes (Signed)
Social Work Patient ID: [name], female   DOB: [birth year], 83 y.o.   MRN: [id number]  Met with husband, two daughter's and son who was here to go through therapies with pt and learn her care and discuss discharge plans. All in agreement with referral to hospice services and are aware of their services. Discussed equipment needs. [name] having wifi installed so she can see them at all times. All want to take pt home and provide 24 hr physical care. Aware if not awake to leave her in the bed. Coming back tomorrow for more education. Work on toward discharge Friday.

## 2018-02-27 NOTE — Progress Notes (Signed)
Physical Therapy Session Note  Patient Details  Name: [name] MRN: [id number] Date of Birth: 18-Jun-1935  Today's Date: 02/27/2018 PT Individual Time: 1100-1200 PT Individual Time Calculation (min): 60 min   Short Term Goals: Week 2:  PT Short Term Goal 1 (Week 2): STG=LTG due to ELOS  Skilled Therapeutic Interventions/Progress Updates:   Session focused on skilled education for family members (daughter, [name], and daughter-in-law, Fraser Din) regarding functional mobility including bed mobility, stand pivot transfers, and general tolerance upright/OOB activity including arousal techniques. Pt sleeping in supine and unable to arouse w/ sternal rub and calling name. Transferred to EOB w/ total assist and maintained static sitting w/ max support in attempts to increase arousal. Family members providing bites of ice cream. Pt vocalizing more non-fluent jargon at this point, but not opening eyes. Responding appropriately to requests to open mouth to take bite of ice cream and she was chewing/swallowing w/o prompting. After 20 min, pt did open eyes and seem more alert and awake. Cautioned family members when feeding pt while she is still sleepy 2/2 risk of aspiration and choking. Transferred to w/c via stand pivot w/ max-total assist x1. Continued w/ attempts at arousing pt more, within ~10 min, pt awake and alert. Using LUE to reaching for spoon and interacting more with family members. Educated daughter on using techniques to increase arousal, however only to perform OOB activity when pt is safe to do so. Safety decreases when pt is lethargic, did not recommend that family perform max-total assist transfer 2/2 safety concerns. Performed transfer to bedside commode w/ min assist via stand pivot while educating family on cues therapist was using. Daughter [name]) returned demonstration safely back to w/c. Daughter-in-law and son, Ludwig Clarks, resistant to performing transfer themselves and did not participate  further despite therapists' requests. Ended session in w/c w/ quick release belt engaged, all needs met.   Therapy Documentation Precautions:  Precautions Precautions: Fall Restrictions Weight Bearing Restrictions: No Vital Signs: Therapy Vitals Temp: 97.8 F (36.6 C) Temp Source: Axillary Pulse Rate: (!) 58 BP: (!) 120/59 Patient Position (if appropriate): Lying Oxygen Therapy SpO2: 100 % O2 Device: Room Air  See Function Navigator for Current Functional Status.   Therapy/Group: Individual Therapy  [name] 02/27/2018, 2:56 PM

## 2018-02-27 NOTE — Plan of Care (Signed)
  Problem: RH Dressing Goal: LTG Patient will perform lower body dressing w/assist (OT) Description LTG: Patient will perform lower body dressing with assist, with/without cues in positioning using equipment (OT) Outcome: Not Applicable Flowsheets (Taken 02/27/2018 0857) LTG: Pt will perform lower body dressing with assistance level of: -- (dc goal 4/9 ESD) Note:  D/c goal 4/9-ESD 2/2 lack of progress   Problem: RH Toileting Goal: LTG Patient will perform toileting w/assist, cues/equip (OT) Description LTG: Patient will perform toiletiing (clothes management/hygiene) with assist, with/without cues using equipment (OT) Outcome: Not Applicable Flowsheets (Taken 02/27/2018 0857) LTG: Pt will perform toileting with assistance level of: -- (dc goal 4/9) Note:  D/c goal 4/9 2/2 lack of progress   Problem: RH Tub/Shower Transfers Goal: LTG Patient will perform tub/shower transfers w/assist (OT) Description LTG: Patient will perform tub/shower transfers with assist, with/without cues using equipment (OT) Outcome: Not Applicable Flowsheets (Taken 02/27/2018 0857) LTG: Pt will perform tub/shower stall transfers with assistance level of: -- (dc goal 4/9-ESD) Note:  D/c goal 4/9 2/2 lack of progress and unsafe to complete at home

## 2018-02-27 NOTE — Progress Notes (Signed)
Physical Therapy Session Note  Patient Details  Name: [name] MRN: [id number] Date of Birth: 12/06/1934  Today's Date: 02/27/2018 PT Individual Time: 1530-1600 PT Individual Time Calculation (min): 30 min   Short Term Goals: Week 2:  PT Short Term Goal 1 (Week 2): STG=LTG due to ELOS  Skilled Therapeutic Interventions/Progress Updates:    Pt seated in w/c upon PT arrival, no evidence of pain. Pt lethargic and does not open eyes to stimuli. Pt's family (daughter-[name], husband and family friend present for family training). Pt transported to gym in w/c, dependent. Therapist demonstrated and instructed family in stair bumping techniques, family members performed x 2. Pt transported back to room, therapist instructed family on +2 transfer technique for stand pivot using gait belt for safety, therapist emphasized that the pt should only be transferred like this (not alert/asleep) if necessary such as on day of discharge, otherwise the family should only perform transfers if pt is awake and engaging. Pt seated EOB with max assist for sitting balance, pt grasped cup and took a few sips of water. Pt transferred back to supine total assist and left supine in bed with needs in reach, bed alarm set.    Therapy Documentation Precautions:  Precautions Precautions: Fall Restrictions Weight Bearing Restrictions: No   See Function Navigator for Current Functional Status.   Therapy/Group: Individual Therapy  Netta Corrigan, PT, DPT 02/27/2018, 4:13 PM

## 2018-02-27 NOTE — Progress Notes (Signed)
Occupational Therapy Session Note  Patient Details  Name: [name] MRN: [id number] Date of Birth: 1934/11/27  Today's Date: 02/27/2018 OT Individual Time: 1430-1530 OT Individual Time Calculation (min): 60 min   Short Term Goals: Week 2:  OT Short Term Goal 1 (Week 2): Pt will safely transfer from w/c to toilet with consistent mod A OT Short Term Goal 2 (Week 2): Pt will complete oral hygiene with mod multimodal cues OT Short Term Goal 3 (Week 2): Pt will complete seated level LB dressing with mod  Skilled Therapeutic Interventions/Progress Updates:    OT treatment session focused on family education. Pt's daughter, husband, and two other family members present. Talked with family about high level of care pt will be at home. OT does not recommend pt dependently transfer pt when she is lethargic and not awake-pt will need to be provided bed level care including sponge baths only at this time.  Family states they understand the burden of care and are willing to take her home despite this. Had pt's daughter dependently sit pt EOB to try to wake her up. Pt did open eyes briefly but needed 2nd person to sit behind pt to keep her up. OT educated pt's daughter on use of gait belt and postitioning. Had daughter get into position as if she were going to transfer pt. Re-iterated that OT does not recommend family completing total A transfer, but OT complete skilled Total A transfer bed>wc. Once in wc, pt briefly awake, but quickly returned to sleep. Educated family on use of shower cap and utilized shower cap with pt and family as sponge baths recommended at this time. Pt left seated in wc with family present in preparation for next PT session.   Therapy Documentation Precautions:  Precautions Precautions: Fall Restrictions Weight Bearing Restrictions: No     See Function Navigator for Current Functional Status.   Therapy/Group: Individual Therapy  Valma Cava 02/27/2018, 3:31 PM

## 2018-02-28 ENCOUNTER — Inpatient Hospital Stay (HOSPITAL_COMMUNITY): Payer: Medicare Other

## 2018-02-28 ENCOUNTER — Inpatient Hospital Stay (HOSPITAL_COMMUNITY): Payer: Medicare Other | Admitting: Occupational Therapy

## 2018-02-28 ENCOUNTER — Inpatient Hospital Stay (HOSPITAL_COMMUNITY): Payer: Medicare Other | Admitting: Speech Pathology

## 2018-02-28 DIAGNOSIS — Z515 Encounter for palliative care: Secondary | ICD-10-CM

## 2018-02-28 DIAGNOSIS — Z7189 Other specified counseling: Secondary | ICD-10-CM

## 2018-02-28 LAB — GLUCOSE, CAPILLARY
GLUCOSE-CAPILLARY: 157 mg/dL — AB (ref 65–99)
GLUCOSE-CAPILLARY: 130 mg/dL — AB (ref 65–99)
GLUCOSE-CAPILLARY: 248 mg/dL — AB (ref 65–99)
Glucose-Capillary: 274 mg/dL — ABNORMAL HIGH (ref 65–99)

## 2018-02-28 MED ORDER — FLAVOXATE HCL 100 MG PO TABS
100.0000 mg | ORAL_TABLET | Freq: Three times a day (TID) | ORAL | Status: DC | PRN
Start: 2018-02-28 — End: 2018-03-02
  Filled 2018-02-28: qty 1

## 2018-02-28 MED ORDER — PHENAZOPYRIDINE HCL 100 MG PO TABS
100.0000 mg | ORAL_TABLET | Freq: Once | ORAL | Status: AC
  Administered 2018-02-28: 100 mg via ORAL
  Filled 2018-02-28 (×2): qty 1

## 2018-02-28 MED ORDER — PHENAZOPYRIDINE HCL 100 MG PO TABS
100.0000 mg | ORAL_TABLET | Freq: Three times a day (TID) | ORAL | Status: DC
  Administered 2018-02-28 – 2018-03-02 (×3): 100 mg via ORAL
  Filled 2018-02-28 (×6): qty 1

## 2018-02-28 MED ORDER — LIDOCAINE HCL 2 % EX GEL
1.0000 "application " | Freq: Once | CUTANEOUS | Status: AC
  Administered 2018-02-28: 1 via URETHRAL
  Filled 2018-02-28: qty 5

## 2018-02-28 NOTE — Progress Notes (Signed)
Occupational Therapy Session Note  Patient Details  Name: [name] MRN: [id number] Date of Birth: Dec 03, 1934  Today's Date: 02/28/2018 OT Individual Time: 1132-1202 OT Individual Time Calculation (min): 30 min   Short Term Goals: Week 2:  OT Short Term Goal 1 (Week 2): Pt will safely transfer from w/c to toilet with consistent mod A OT Short Term Goal 2 (Week 2): Pt will complete oral hygiene with mod multimodal cues OT Short Term Goal 3 (Week 2): Pt will complete seated level LB dressing with mod  Skilled Therapeutic Interventions/Progress Updates:    Pt greeted semi-reclined in bed with daughter-in-law present, pt restless and pulling at diaper, assumed pt needed to go to the bathroom.  Pt came to sitting EOB with mod A. Stand-pivot to [facility] with max A to lift and mod A to pivot- 2nd person needed for clothing management. Pt already incontinent of BM, but had some continent BM in commode. Had pt's daughter in law place gait belt and then educated her on body mechanics to assist with sit<>stand. While pt's daughter-in-law stood pt, OT completed peri-care and brief change with total A. Returned to sitting on lid of commode and reviewed stand-pivot transfer with daughter-in law. She then demonstrated stand-pivot with pt-mod A for transfer to wc. Pt fell asleep again once sitting in wc-safety belt on and daughter-in-law present.   Therapy Documentation Precautions:  Precautions Precautions: Fall Restrictions Weight Bearing Restrictions: No  See Function Navigator for Current Functional Status.  Therapy/Group: Individual Therapy  Valma Cava 02/28/2018, 12:09 PM

## 2018-02-28 NOTE — Progress Notes (Signed)
Occupational Therapy Note  Patient Details  Name: [name] MRN: [id number] Date of Birth: 07/15/1935  Today's Date: 02/28/2018 OT Individual Time: 1415-1445 OT Individual Time Calculation (min): 30 min   Upon entering the room, pt sleeping soundly in bed with caregiver present in room. OT discussing discharge set up and equipment recommendations with caregiver. OT recommending ambulance transport secondary to pt's current level of fatigue. Caregiver declines at this time. Caregiver with several concerns regarding bladder and possible UTI. RN arrived to provided education on possible foley placement. OT exited the room with pt continuing to sleep soundly at this time.    Darleen Crocker P 02/28/2018, 4:30 PM

## 2018-02-28 NOTE — Plan of Care (Signed)
  Problem: Consults Goal: RH STROKE PATIENT EDUCATION Description See Patient Education module for education specifics  Outcome: Progressing   Problem: RH BOWEL ELIMINATION Goal: RH STG MANAGE BOWEL WITH ASSISTANCE Description STG Manage Bowel with mod Assistance.   Outcome: Progressing Goal: RH STG MANAGE BOWEL W/MEDICATION W/ASSISTANCE Description STG Manage Bowel with Medication with mod Assistance.   Outcome: Progressing   Problem: RH BLADDER ELIMINATION Goal: RH STG MANAGE BLADDER WITH ASSISTANCE Description STG Manage Bladder With  mod Assistance   Outcome: Progressing   Problem: RH SAFETY Goal: RH STG ADHERE TO SAFETY PRECAUTIONS W/ASSISTANCE/DEVICE Description STG Adhere to Safety Precautions With  Mod cues/reminders/ Assistance/Device.   Outcome: Progressing   Problem: RH PAIN MANAGEMENT Goal: RH STG PAIN MANAGED AT OR BELOW PT'S PAIN GOAL Description At or below level 4  Outcome: Progressing   Problem: RH KNOWLEDGE DEFICIT Goal: RH STG INCREASE KNOWLEDGE OF DIABETES Description Family will be able to verbalize understanding of diabetes management, CBG monitoring and medications indepently with cues/reminders/resources provided  Outcome: Progressing Goal: RH STG INCREASE KNOWLEDGE OF HYPERTENSION Description Family will be able to state and demo understanding of HTN, management, medications and dietary restrictions independendly using cues/resources and handouts provided  Outcome: Progressing   Problem: Skin Integrity: Goal: No redness or skin breakdown Description No redness or skin breakdown with mod assist  Outcome: Progressing

## 2018-02-28 NOTE — Consult Note (Addendum)
Consultation Note   Patient Name: [name]       Date: 02/28/2018 DOB: 05/13/1935  Age: 83 y.o. MRN#: [id number] Attending Physician: Charlett Blake, MD Primary Care Physician: Cyndi Bender, PA-C Admit Date: 02/13/2018  Reason for Consultation  : Establishing goals of care and Hospice Evaluation  Subjective: Received request for  "follow up and any additional recommendations" from Physical Medicine and Rehab PA- Reesa Chew. Noted patient has been referred to and accepted by [facility]. Home equipment being delivered tomorrow.  Chart reviewed. Patient becoming more lethargic and increasingly difficult to arouse. Family has requested to take patient home with Hospice. Decreasing po intake. Not showing significant improvements in meaningful functional status with multi-modal aggressive rehab therapies. She is retaining urine, requiring frequent in and out catheterization.  On examination, she does not open her eyes to my voice or touch. Daughter in law and family friend at bedside note she was difficult to arouse with cold compress earlier, but did eat a few bites a lunch.  Review of Systems  Unable to perform ROS: Mental status change    Length of Stay: 15  Current Medications: Scheduled Meds:  . apixaban  2.5 mg Oral BID  . ezetimibe  10 mg Oral Daily  . [START ON 03/01/2018] fluconazole  100 mg Oral Once  . insulin aspart  0-9 Units Subcutaneous TID WC  . insulin glargine  35 Units Subcutaneous QHS  . ketotifen  1 drop Both Eyes BID  . levETIRAcetam  250 mg Oral BID  . levothyroxine  75 mcg Oral QAC breakfast  . lidocaine  1 application Urethral Once  . lisinopril  5 mg Oral Daily  . megestrol  200 mg Oral BID  . polyethylene glycol  17 g Oral Daily  . senna  2  tablet Oral Daily  . sodium polystyrene  15 g Rectal Once   Or  . sodium polystyrene  15 g Oral Once    Continuous Infusions: . sodium chloride Stopped (02/28/18 0900)    PRN Meds: acetaminophen **OR** acetaminophen, alum & mag hydroxide-simeth, bisacodyl, guaiFENesin-dextromethorphan, lip balm, MUSCLE RUB, nitroGLYCERIN, polyvinyl alcohol, prochlorperazine **OR** prochlorperazine **OR** prochlorperazine, sodium chloride flush, sorbitol  Physical Exam  Constitutional: She appears well-developed. No distress.  Frail, ill appearing  Cardiovascular: Normal rate.  Murmur heard. Pulmonary/Chest: Effort  normal.  Abdominal: Soft.  Neurological:  UTA orientaion, nonarouseable  Skin: She is not diaphoretic. There is pallor.  Nursing note and vitals reviewed.           Vital Signs: BP (!) 148/84 (BP Location: Right Arm)   Pulse 69   Temp 97.9 F (36.6 C) (Oral)   Resp 17   Ht _0  (1.6 m) Comment: per record  Wt 60.1 kg (132 lb 7.9 oz)   SpO2 100%   BMI 23.47 kg/m  SpO2: SpO2: 100 % O2 Device: O2 Device: Room Air O2 Flow Rate:    Intake/output summary:   Intake/Output Summary (Last 24 hours) at 02/28/2018 1429 Last data filed at 02/28/2018 1230 Gross per 24 hour  Intake 1150 ml  Output 1739 ml  Net -589 ml   LBM: Last BM Date: 02/28/18 Baseline Weight: Weight: 60 kg (132 lb 4.4 oz) Most recent weight: Weight: 60.1 kg (132 lb 7.9 oz)       Palliative Assessment/Data: PPS: 20%      Patient Active Problem List   Diagnosis Date Noted  . Seizures ([place])   . Acute ischemic left middle cerebral artery (MCA) stroke ([place]) 02/13/2018  . DNR (do not resuscitate) discussion   . Palliative care by specialist   . Goals of care, counseling/discussion   . Sundowning   . Aphasia   . Coronary artery disease involving native coronary artery of native heart without angina pectoris   . History of CVA (cerebrovascular accident)   . Poorly controlled type 2 diabetes mellitus ([place])    . AKI (acute kidney injury) ([place])   . Agitation   . Expressive aphasia   . Stroke ([place]) 02/09/2018  . Ischemic cardiomyopathy 06/27/2014  . Weakness 06/11/2014  . CAD S/P DES to Prox Cx & SVG-D1; PTCA of Anastomotic SVG-D1 lesion 06/05/2014  . NSTEMI (non-ST elevated myocardial infarction) ([place]) 06/04/2014  . Gout 03/13/2012  . History of breast cancer   . Chronic combined systolic and diastolic congestive heart failure, NYHA class 2 ([place]) 03/12/2012  . 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 Renal artery stenosis ([place]) 03/12/2012  . Essential hypertension 03/12/2012  . CVA (cerebral vascular accident) ([place]) 03/12/2012  . DM (diabetes mellitus), type 2, uncontrolled with complications ([place]) 22/97/9892  . Chronic kidney disease (CKD), stage II (mild) 03/12/2012  . Hypothyroidism- (TSH low, Synthroid held) 03/12/2012  . Hyperlipidemia with target LDL less than 70; and statin intolerant 03/12/2012  . Bradycardia 03/12/2012  . Syncope 03/12/2012  . History of DVT (deep vein thrombosis) 03/12/2012  . DJD (degenerative joint disease) of knee   . Hx of adenomatous colonic polyps     Palliative Care Assessment & Plan   Patient Profile: 83 y.o. female  with past medical history of CVA, hypothyroidism, dementia, CAD s/p CABG, HTN, DJD, breast CA, CHF, CKD, and cardiomyopathy admitted on 02/09/2018 with new expressive aphasia and facial droop. Workup revealed L MCA frotoparietal infarct, due to a. Fib and low EF- resulting in aphasia, R hemiparesis, R neglect. Echo revealed newly reduced EF to 25% from 40-45%. Palliative medicine met with patient during admission to [facility] on 3/26 and GOC were determined to be "treat the treatable" and patient was admitted to continuous inpatient rehab. She has been having focal seizures, appears to be declining. Palliative medicine consulted for ongoing [place] and additional  recommendations.   Assessment/Recommendations/Plan   Per discussion with Reesa Chew, PA  and review of chart family does not desire continued aggressive care, wishes to take patient home and has been referred to and accepted by [facility] for at home services. Unable to reach family today and discuss.  After examining patient and reviewing chart, I agree that Hospice is appropriate for patient if Hospice philosophy of support through end of life process with comfort and dignity, focus on symptom management aligns with family's GOC- I have contacted patient's daughter [name] to confirm [place] - left a message requesting return call  Would recommend a foley catheter placed for urinary retention for symptom management  Comfort medication recommendations for discharge:   Oxycodone concentrated solution 80m q2hr prn SOB, Pain (consider giving when patient is agitated as she may be having pain she cannot communicate)  Lorazepam solution 163mq4hr prn for seizure activity, agitation  Goals of Care and Additional Recommendations:  Limitations on Scope of Treatment: Avoid Hospitalization  Code Status:  DNR  Prognosis:   < 6 weeks due to CVA, seizures, significant decline in functional status limited PO intake   Discharge Planning:  Home with Hospice  Care plan was discussed with PaReesa ChewNP.   Thank you for allowing the Palliative Medicine Team to assist in the care of this patient.   Time In: 1430 Time Out: 1530 Total Time 60 minutes Prolonged Time Billed NO      Greater than 50%  of this time was spent counseling and coordinating care related to the above assessment and plan.  KaMariana KaufmanAGNP-C Palliative Medicine   Please contact Palliative Medicine Team phone at [phone] questions and concerns.

## 2018-02-28 NOTE — Progress Notes (Signed)
Social Work Patient ID: [name], female   DOB: [birth year], 83 y.o.   MRN: [id number] referral made to [facility] and health and they have accepted the referral and will meet with family Friday once discharged home. Equipment has been ordered and to be delivered tomorrow. On-going family education today with family. Work toward discharge Friday. Aware of team conference update.

## 2018-02-28 NOTE — Patient Care Conference (Signed)
Inpatient RehabilitationTeam Conference and Plan of Care Update Date: 02/28/2018   Time: 11:20 AM    Patient Name: [name]      Medical Record Number: [id number]  Date of Birth: [birth year] Sex: Female         Room/Bed: 4W18C/4W18C-01 Payor Info: Payor: [employer] park manager MEDICARE / Plan: [employer] MEDICARE / Product Type: *No Product type* /    Admitting Diagnosis: Stroke  Admit Date/Time:  02/13/2018  4:57 PM Admission Comments: No comment available   Primary Diagnosis:  <principal problem not specified> Principal Problem: <principal problem not specified>  Patient Active Problem List   Diagnosis Date Noted  . Seizures ([place])   . Acute ischemic left middle cerebral artery (MCA) stroke ([place]) 02/13/2018  . DNR (do not resuscitate) discussion   . Palliative care by specialist   . Goals of care, counseling/discussion   . Sundowning   . Aphasia   . Coronary artery disease involving native coronary artery of native heart without angina pectoris   . History of CVA (cerebrovascular accident)   . Poorly controlled type 2 diabetes mellitus ([place])   . AKI (acute kidney injury) ([place])   . Agitation   . Expressive aphasia   . Stroke ([place]) 02/09/2018  . Ischemic cardiomyopathy 06/27/2014  . Weakness 06/11/2014  . CAD S/P DES to Prox Cx & SVG-D1; PTCA of Anastomotic SVG-D1 lesion 06/05/2014  . NSTEMI (non-ST elevated myocardial infarction) ([place]) 06/04/2014  . Gout 03/13/2012  . History of breast cancer   . Chronic combined systolic and diastolic congestive heart failure, NYHA class 2 ([place]) 03/12/2012  . 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 Renal artery stenosis ([place]) 03/12/2012  . Essential hypertension 03/12/2012  . CVA (cerebral vascular accident) ([place]) 03/12/2012  . DM (diabetes mellitus), type 2, uncontrolled with complications ([place]) 51/88/4166  . Chronic kidney disease (CKD), stage II (mild) 03/12/2012  .  Hypothyroidism- (TSH low, Synthroid held) 03/12/2012  . Hyperlipidemia with target LDL less than 70; and statin intolerant 03/12/2012  . Bradycardia 03/12/2012  . Syncope 03/12/2012  . History of DVT (deep vein thrombosis) 03/12/2012  . DJD (degenerative joint disease) of knee   . Hx of adenomatous colonic polyps     Expected Discharge Date: Expected Discharge Date: 03/02/18  Team Members Present: Physician leading conference: Dr. Alysia Penna Social Worker Present: Ovidio Kin, LCSW Nurse Present: Benjie Karvonen, RN PT Present: Michaelene Song, PT OT Present: Cherylynn Ridges, OT PPS Coordinator present : Daiva Nakayama, RN, CRRN     Current Status/Progress Goal Weekly Team Focus  Medical   global aphasia, urinary retention , global aphasia  reduce risk of seizures  d/c planning   Bowel/Bladder   incont b/b; lbm 4/5  timed toileting q2-3 hr while awake  q6 cath if no void or scan over 347ml; assess q shift and prn; admin bisacodyl suppository   Swallow/Nutrition/ Hydration             ADL's   Mod/Max A overall, R UE apraxia, lethargic, continues to be inconsistent  Goals downgraded to Mod A  pt/family education, dc planning, attention, initiation, safety awareness,   Mobility   mod assist bed mobility and transfers at best, max-total assist most of the time 2/2 lethargy and fatigue, gait is non-functional (requires +2 skilled assist)  min-mod overall, downgraded 1 week ago  functional transfers, awareness, engaging in functional upright/OOB activity, safety, family education and discharge planning  Communication   Max-Total  Max A  family Education   Safety/Cognition/ Behavioral Observations  Max   Max A  family education    Pain   faces=0  <2 on faces pain scale  assess q shift and prn   Skin   R breast MASD--powder; ecchymosis BUE  free from infection/breakdown during rehab stay  assess q shift and prn      *See Care Plan and progress notes for long and short-term  goals.     Barriers to Discharge  Current Status/Progress Possible Resolutions Date Resolved   Physician    Medical stability     slow progress  Caregiver training      Nursing                  PT  Home environment access/layout;Behavior;Decreased caregiver support;Lack of/limited family support  Husband unable to physically assist, children have hired caregiver 6 hrs/day but have not yet committed to spending the night, pt requires skilled care for all OOB mobility              OT                  SLP                SW                Discharge Planning/Teaching Needs:  On-going family education this week, plan to take her home and provide 24 hr care. Have hired a Quarry manager during the day. Agreeable to hospice services      Team Discussion:  Continues to flucuate in her level of alertness and lethargy. On-going family education in preparation for home this Friday. Aware will require 24 hr physical care. Po intake variable. Retaining urine now and needed to be cathed. Questions need for foley to go home with. Hospice referral being made. Downgraded goals to mod/max level  Revisions to Treatment Plan:  DC 4/12    Continued Need for Acute Rehabilitation Level of Care: The patient requires daily medical management by a physician with specialized training in physical medicine and rehabilitation for the following conditions: Daily direction of a multidisciplinary physical rehabilitation program to ensure safe treatment while eliciting the highest outcome that is of practical value to the patient.: Yes Daily medical management of patient stability for increased activity during participation in an intensive rehabilitation regime.: Yes Daily analysis of laboratory values and/or radiology reports with any subsequent need for medication adjustment of medical intervention for : Neurological problems;Blood pressure problems;Mood/behavior problems  Janeen Watson, Gardiner Rhyme 02/28/2018, 1:44 PM

## 2018-02-28 NOTE — Progress Notes (Signed)
Speech Language Pathology Daily Session Note  Patient Details  Name: [name] MRN: [id number] Date of Birth: 1935/04/01  Today's Date: 02/28/2018 SLP Individual Time: 1035-1105 SLP Individual Time Calculation (min): 30 min  Short Term Goals: Week 2: SLP Short Term Goal 1 (Week 2): Patient will identify functional items from a field of 2 with 50% accuracy with Max A multimodal cues.  SLP Short Term Goal 2 (Week 2): Patient will answer basic yes/no questions in regards to biographical information with 50% accuracy and Max A multimodal cues.  SLP Short Term Goal 3 (Week 2): Patient will follow basic 1-step directions during functional tasks with Max A multimodal cues.  SLP Short Term Goal 4 (Week 2): Patient will complete basic and functional problem solving tasks with Max A multimodal cues.  SLP Short Term Goal 5 (Week 2): Patient will demonstrate sustained attention for 5-minute intervals during functional tasks with Max A verbal cues.  SLP Short Term Goal 6 (Week 2): Patient will self-monitor verbal errors x2 within a 45-minute session with Max A multimodal cues.   Skilled Therapeutic Interventions:  Pt was seen for skilled ST targeting communication goals.  Pt was received supine in bed, asleep and difficult to arouse despite sternal rub and cold compress.  Pt did awaken when repositioned to sitting upright in bed but remained lethargic.  Once sitting upright, pt began moving about bed restlessly and pulling off her covers.  Therapist asked if pt had to use the bathroom but pt was too distracted to sufficiently answer yes/no questions appropriately.  Brief was checked and pt noted to have been incontinent of stool.  Pt needed max to total cues for following directions during bed level hygiene and donning clean brief.  Once pt was cleaned and repositioned, therapist offered pt simple of choices of food and liquids as she had not eaten yet.  Pt would either accept PO briefly (2-3 sips of  water, 1 bite of banana) or would push away items that she did not want.  Pt continued to be lethargic which limited any meaningful participation in therapies.  As a result, session was ended early.  Pt was left in bed with bed alarm set and family at bedside.  Continue per current plan of care.    Function:  Eating Eating     Eating Assist Level: Helper brings food to mouth;Help managing cup/glass;Set up assist for;Supervision or verbal cues   Eating Set Up Assist For: Opening containers   Helper Brings Food to Mouth: Occasionally   Cognition Comprehension Comprehension assist level: Understands basic 25 - 49% of the time/ requires cueing 50 - 75% of the time  Expression   Expression assist level: Expresses basis less than 25% of the time/requires cueing >75% of the time.  Social Interaction Social Interaction assist level: Interacts appropriately less than 25% of the time. May be withdrawn or combative.  Problem Solving Problem solving assist level: Solves basic less than 25% of the time - needs direction nearly all the time or does not effectively solve problems and may need a restraint for safety  Memory Memory assist level: Recognizes or recalls less than 25% of the time/requires cueing greater than 75% of the time    Pain Pain Assessment Pain Scale: 0-10 Pain Score: 0-No pain Faces Pain Scale: No hurt  Therapy/Group: Individual Therapy  [name], [name] 02/28/2018, 11:19 AM

## 2018-02-28 NOTE — Progress Notes (Signed)
Subjective/Complaints: Remains aphasic, occ      ROS- cannot obtain due to aphasia Objective: Vital Signs: Blood pressure 134/85, pulse 60, temperature 98.2 F (36.8 C), temperature source Axillary, resp. rate 18, height 5' 3"  (1.6 m), weight 60.1 kg (132 lb 7.9 oz), SpO2 99 %. No results found. Results for orders placed or performed during the hospital encounter of 02/13/18 (from the past 72 hour(s))  Glucose, capillary     Status: Abnormal   Collection Time: 02/25/18 11:49 AM  Result Value Ref Range   Glucose-Capillary 258 (H) 65 - 99 mg/dL   Comment 1 Notify RN   Glucose, capillary     Status: Abnormal   Collection Time: 02/25/18  4:43 PM  Result Value Ref Range   Glucose-Capillary 247 (H) 65 - 99 mg/dL  Glucose, capillary     Status: Abnormal   Collection Time: 02/25/18  9:11 PM  Result Value Ref Range   Glucose-Capillary 223 (H) 65 - 99 mg/dL   Comment 1 Notify RN   Basic metabolic panel     Status: Abnormal   Collection Time: 02/26/18  5:24 AM  Result Value Ref Range   Sodium 137 135 - 145 mmol/L   Potassium 4.2 3.5 - 5.1 mmol/L   Chloride 103 101 - 111 mmol/L   CO2 23 22 - 32 mmol/L   Glucose, Bld 126 (H) 65 - 99 mg/dL   BUN 25 (H) 6 - 20 mg/dL   Creatinine, Ser 1.40 (H) 0.44 - 1.00 mg/dL   Calcium 9.4 8.9 - 10.3 mg/dL   GFR calc non Af Amer 34 (L) >60 mL/min   GFR calc Af Amer 40 (L) >60 mL/min    Comment: (NOTE) The eGFR has been calculated using the CKD EPI equation. This calculation has not been validated in all clinical situations. eGFR's persistently <60 mL/min signify possible Chronic Kidney Disease.    Anion gap 11 5 - 15    Comment: Performed at [facility] [address] [place] [address]  CBC     Status: None   Collection Time: 02/26/18  5:24 AM  Result Value Ref Range   WBC 7.8 4.0 - 10.5 K/uL   RBC 4.61 3.87 - 5.11 MIL/uL   Hemoglobin 14.6 12.0 - 15.0 g/dL   HCT 43.0 36.0 - 46.0 %   MCV 93.3 78.0 - 100.0 fL   MCH 31.7 26.0 -  34.0 pg   MCHC 34.0 30.0 - 36.0 g/dL   RDW 12.8 11.5 - 15.5 %   Platelets 262 150 - 400 K/uL    Comment: Performed at [facility] [address], [place], [place] [address]  Glucose, capillary     Status: Abnormal   Collection Time: 02/26/18  6:16 AM  Result Value Ref Range   Glucose-Capillary 133 (H) 65 - 99 mg/dL  Glucose, capillary     Status: Abnormal   Collection Time: 02/26/18 11:52 AM  Result Value Ref Range   Glucose-Capillary 141 (H) 65 - 99 mg/dL  Glucose, capillary     Status: Abnormal   Collection Time: 02/26/18  4:17 PM  Result Value Ref Range   Glucose-Capillary 182 (H) 65 - 99 mg/dL   Comment 1 Notify RN   Glucose, capillary     Status: Abnormal   Collection Time: 02/26/18  9:29 PM  Result Value Ref Range   Glucose-Capillary 279 (H) 65 - 99 mg/dL  Glucose, capillary     Status: Abnormal  Collection Time: 02/27/18  6:38 AM  Result Value Ref Range   Glucose-Capillary 189 (H) 65 - 99 mg/dL  Glucose, capillary     Status: Abnormal   Collection Time: 02/27/18  4:27 PM  Result Value Ref Range   Glucose-Capillary 292 (H) 65 - 99 mg/dL   Comment 1 Notify RN   Glucose, capillary     Status: Abnormal   Collection Time: 02/27/18  9:11 PM  Result Value Ref Range   Glucose-Capillary 188 (H) 65 - 99 mg/dL   Comment 1 Notify RN   Glucose, capillary     Status: Abnormal   Collection Time: 02/28/18  6:22 AM  Result Value Ref Range   Glucose-Capillary 157 (H) 65 - 99 mg/dL     HEENT: normal Cardio: RRR and no murmur Resp: CTA B/L and no wheezes GI: BS positive and NT, ND Extremity:  No Edema Skin:   Bruise forearms Neuro: Abnormal Sensory reduced sensation to pinch Right Upper and Lower limb, Abnormal Motor 3/5 RUE and RLE, Abnormal FMC Ataxic/ dec FMC and Aphasic Musc/Skel:  Other no pain with UE or LE ROM Gen NAD   Assessment/Plan: 1. Functional deficits secondary to Left thalamic and Left parietal infarct which require 3+ hours per day of  interdisciplinary therapy in a comprehensive inpatient rehab setting. Physiatrist is providing close team supervision and 24 hour management of active medical problems listed below. Physiatrist and rehab team continue to assess barriers to discharge/monitor patient progress toward functional and medical goals. FIM: Function - Bathing Position: Shower Body parts bathed by patient: Right arm, Chest, Left upper leg Body parts bathed by helper: Left arm, Abdomen, Front perineal area, Buttocks, Right upper leg, Right lower leg, Left lower leg, Back Bathing not applicable: Buttocks Assist Level: Touching or steadying assistance(Pt > 75%)  Function- Upper Body Dressing/Undressing What is the patient wearing?: Pull over shirt/dress Pull over shirt/dress - Perfomed by patient: Thread/unthread right sleeve, Thread/unthread left sleeve, Put head through opening, Pull shirt over trunk Pull over shirt/dress - Perfomed by helper: Thread/unthread right sleeve, Thread/unthread left sleeve, Put head through opening, Pull shirt over trunk Assist Level: 2 helpers Function - Lower Body Dressing/Undressing What is the patient wearing?: Non-skid slipper socks Position: Wheelchair/chair at sink Non-skid slipper socks- Performed by patient: Don/doff right sock Non-skid slipper socks- Performed by helper: Don/doff right sock, Don/doff left sock Assist for footwear: Dependant Assist for lower body dressing: Touching or steadying assistance (Pt > 75%)  Function - Toileting Toileting activity did not occur: No continent bowel/bladder event Toileting steps completed by patient: Performs perineal hygiene Toileting steps completed by helper: Adjust clothing prior to toileting, Performs perineal hygiene, Adjust clothing after toileting Toileting Assistive Devices: Grab bar or rail Assist level: Two helpers  Function - Air cabin crew transfer activity did not occur: Safety/medical concerns Toilet transfer  assistive device: Elevated toilet seat/BSC over toilet Assist level to toilet: 2 helpers Assist level from toilet: 2 helpers Assist level to bedside commode (at bedside): Maximal assist (Pt 25 - 49%/lift and lower) Assist level from bedside commode (at bedside): Maximal assist (Pt 25 - 49%/lift and lower)  Function - Chair/bed transfer Chair/bed transfer method: Stand pivot Chair/bed transfer assist level: 2 helpers Chair/bed transfer assistive device: Armrests, Bedrails Chair/bed transfer details: Manual facilitation for placement, Manual facilitation for weight shifting, Tactile cues for sequencing, Tactile cues for initiation, Tactile cues for placement, Verbal cues for sequencing, Verbal cues for technique, Manual facilitation for weight bearing, Verbal cues for precautions/safety,  Tactile cues for posture  Function - Locomotion: Wheelchair Will patient use wheelchair at discharge?: Yes Type: Manual Max wheelchair distance: 150 ft Assist Level: Dependent (Pt equals 0%)(transport) Assist Level: Dependent (Pt equals 0%) Assist Level: Dependent (Pt equals 0%) Turns around,maneuvers to table,bed, and toilet,negotiates 3% grade,maneuvers on rugs and over doorsills: No Function - Locomotion: Ambulation Assistive device: Hand held assist Max distance: 15' Assist level: 2 helpers Assist level: 2 helpers Walk 50 feet with 2 turns activity did not occur: Safety/medical concerns Assist level: 2 helpers Walk 150 feet activity did not occur: Safety/medical concerns Walk 10 feet on uneven surfaces activity did not occur: Safety/medical concerns  Function - Comprehension Comprehension: Auditory Comprehension assist level: Understands basic 25 - 49% of the time/ requires cueing 50 - 75% of the time  Function - Expression Expression: Verbal Expression assist level: Expresses basis less than 25% of the time/requires cueing >75% of the time.  Function - Social Interaction Social Interaction  assist level: Interacts appropriately less than 25% of the time. May be withdrawn or combative.  Function - Problem Solving Problem solving assist level: Solves basic less than 25% of the time - needs direction nearly all the time or does not effectively solve problems and may need a restraint for safety  Function - Memory Memory assist level: Recognizes or recalls less than 25% of the time/requires cueing greater than 75% of the time Patient normally able to recall (first 3 days only): None of the above  Medical Problem List and Plan:  1. Functional deficits secondary to infarcts in left thalamus and left parietal periventricular white matter  -admit to inpatient rehab  PT, OT, SLP CIR level,2. DVT Prophylaxis/Anticoagulation: Pharmaceutical: Lovenox and Other (comment)  3. Pain Management: tylenol prn  4. Mood: Patient currently with poor awareness of deficits. LCSW to follow for evaluation when appropriate.  -Trial of amantadine for arousal not helpful have d/ced -check sleep chart  -begin scheduled, low dose seroquel  5. Neuropsych: This patient is not capable of making decisions on her own behalf.  6. Skin/Wound Care: routine pressure relief measures  7. Fluids/Electrolytes/Nutrition: encourage fluids, IVF overnite  Discussed Palliative consult if appetite not improving- now on Megace trial  8. Chronic combined systolic/diastolic CHF/chemo induced CM: Heart healthy diet. Monitor for signs of overload and check weights daily. BB discontinued due to bradycardia. Continue Lisinopril daily. Used lasix prn edema at home.  Vitals:   02/27/18 1349 02/28/18 0228  BP: (!) 120/59 134/85  Pulse: (!) 58 60  Resp:  18  Temp: 97.8 F (36.6 C) 98.2 F (36.8 C)  SpO2: 100% 99%  BP controlled 4/10 9. CAD w/p PTCA: On ASA and Zetia.  10. CKD: Avoid nephrotoxic medications. Monitor with serial checks.  11.T2DM: Hgb A1C- 8.6. Blood sugars poorly controlled.--Used Lantus 80 units with metformin at  nights.. Monitor BS ac/hs.elevated am CBG increase Lantus to 35U CBG (last 3)  Recent Labs    02/27/18 1627 02/27/18 2111 02/28/18 0622  GLUCAP 292* 188* 157*  12. Hypothyroid: On supplement.   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  Urinary retention intermittent, may have some cognitive aspect, no anticholinergic meds identified       A FACE TO FACE EVALUATION WAS PERFORMED  ACharlett Blake4/08/2018, 8:08 AM

## 2018-02-28 NOTE — Progress Notes (Signed)
Physical Therapy Session Note  Patient Details  Name: [name] MRN: [id number] Date of Birth: Mar 31, 1935  Today's Date: 02/28/2018 PT Individual Time: 0900-0938, 1500-1600 PT Individual Time Calculation (min): 38 min and 60 min and Today's Date: 02/28/2018 PT Missed Time: 22 Minutes Missed Time Reason: Patient fatigue  Short Term Goals: Week 2:  PT Short Term Goal 1 (Week 2): STG=LTG due to ELOS  Skilled Therapeutic Interventions/Progress Updates:    Session 1: Pt supine in bed upon PT arrival, asleep but arouses to verbal and tactile stimuli. Pt opens eyes briefly and begins to vocalize. Therapist transfers pt from supine>sitting EOB total assist, pt requiring max assist for sitting balance. Pt transferred from bed>w/c with max assist +2 for safety. Pt incontinent of bowel. Pt transferred back to bed max assist +2 stand pivot. Total assist for bed mobility, rolling in each direction to change brief and clean peri area. Therapist attempted to sit pt back up at EOB with max assist, pt begins vocalizing and trying to lay back down, keeping eyes closed. Therapist assisted pt back to supine max assist. Therapist performed gentle PROM to B LEs for mobility/stretching and for R LE tone. Pt left supine in bed with bed alarm set.  Pt missed 22 minutes of skilled therapy tx this session.   Session 2: Pt supine in bed upon PT arrival, asleep and does not respond to verbal/tactile stimuli. Pt's daughter [name]) and friend present for family training. Therapist discussed d/c planning and that the pt may be at this dependent level during transfers to go home tomorrow, therapist would recommend non-emergency ambulance home hover family is persistent in taking pt home in car. Family transfers pt from supine>sitting EOB total assist with instructions from PT for techniques. Pt transferred from bed>w/ stand pivot total assist +2 (therapist as plus 2), verbal cues for techniques. Therapist emphasized that  upon d/c Friday if the pt remains lethargic at this level she will require +2 assist for all transfers to get home. Pt sitting in w/c worked on attention, pt grasps cup when given to her and drink water when therapist assists in bringing straw to mouth. Pt transferred back to bed with total assist +2. Pt's daughter eager to try bedside commode this session, total assist +2 bed<>commode, total assist for clothing management and to clean peri area. RN present towards end of session assisting with commode transfer, pt left supine in bed at end of session in care of family, RN and NT.    Therapy Documentation Precautions:  Precautions Precautions: Fall Restrictions Weight Bearing Restrictions: No   See Function Navigator for Current Functional Status.   Therapy/Group: Individual Therapy  Netta Corrigan, PT, DPT 02/28/2018, 7:48 AM

## 2018-03-01 ENCOUNTER — Inpatient Hospital Stay (HOSPITAL_COMMUNITY): Payer: Medicare Other | Admitting: Physical Therapy

## 2018-03-01 ENCOUNTER — Inpatient Hospital Stay (HOSPITAL_COMMUNITY): Payer: Medicare Other | Admitting: Occupational Therapy

## 2018-03-01 ENCOUNTER — Inpatient Hospital Stay (HOSPITAL_COMMUNITY): Payer: Medicare Other | Admitting: Speech Pathology

## 2018-03-01 LAB — GLUCOSE, CAPILLARY
GLUCOSE-CAPILLARY: 140 mg/dL — AB (ref 65–99)
GLUCOSE-CAPILLARY: 190 mg/dL — AB (ref 65–99)
GLUCOSE-CAPILLARY: 360 mg/dL — AB (ref 65–99)

## 2018-03-01 MED ORDER — ACETAMINOPHEN 325 MG PO TABS
650.0000 mg | ORAL_TABLET | Freq: Three times a day (TID) | ORAL | Status: DC
  Administered 2018-03-01 – 2018-03-02 (×3): 650 mg via ORAL
  Filled 2018-03-01 (×2): qty 2

## 2018-03-01 MED ORDER — INSULIN GLARGINE 100 UNIT/ML ~~LOC~~ SOLN: 35.0000 [IU] | Freq: Every day | SUBCUTANEOUS | 11 refills | Status: AC

## 2018-03-01 MED ORDER — POLYETHYLENE GLYCOL 3350 17 G PO PACK: 17.0000 g | PACK | Freq: Two times a day (BID) | ORAL | 0 refills | Status: AC

## 2018-03-01 MED ORDER — EZETIMIBE 10 MG PO TABS: 10.0000 mg | ORAL_TABLET | Freq: Every day | ORAL | 0 refills | Status: AC

## 2018-03-01 MED ORDER — LEVOTHYROXINE SODIUM 75 MCG PO TABS: 75.0000 ug | ORAL_TABLET | Freq: Every day | ORAL | 0 refills | Status: AC

## 2018-03-01 MED ORDER — LISINOPRIL 5 MG PO TABS: 5.0000 mg | ORAL_TABLET | Freq: Every day | ORAL | 0 refills | Status: AC

## 2018-03-01 MED ORDER — KETOTIFEN FUMARATE 0.025 % OP SOLN: 1.0000 [drp] | Freq: Two times a day (BID) | OPHTHALMIC | 0 refills | Status: AC

## 2018-03-01 MED ORDER — LEVETIRACETAM 250 MG PO TABS: 250.0000 mg | ORAL_TABLET | Freq: Two times a day (BID) | ORAL | 0 refills | Status: AC

## 2018-03-01 MED ORDER — APIXABAN 2.5 MG PO TABS: 2.5000 mg | ORAL_TABLET | Freq: Two times a day (BID) | ORAL | 0 refills | Status: AC

## 2018-03-01 MED ORDER — SENNA 8.6 MG PO TABS: 2.0000 | ORAL_TABLET | Freq: Every day | ORAL | 0 refills | Status: AC

## 2018-03-01 MED ORDER — FLAVOXATE HCL 100 MG PO TABS: 100.0000 mg | ORAL_TABLET | Freq: Three times a day (TID) | ORAL | 0 refills | Status: AC | PRN

## 2018-03-01 NOTE — Progress Notes (Signed)
Speech Language Pathology Discharge Summary  Patient Details  Name: [name] MRN: [id number] Date of Birth: June 28, 1935  Today's Date: 03/01/2018 SLP Individual Time: 2774-1287 SLP Individual Time Calculation (min): 32 min   Skilled Therapeutic Interventions:  Pt was seen for skilled ST targeting cognitive-linguistic goals.  Pt sitting up in wheelchair upon therapist's arrival, awake but lethargic.  Daughter in law was present at bedside.  SLP facilitated the session with hand over hand assist for initiation of self feeding of breakfast.  Pt consumed ~2 bites of grits, ~3 bites of banana, and ~3 sips of coffee.  Pt's only indication of whether or not she wanted to continue eating was her initiation or lack thereof once her hand was placed on food items.  Pt did not answer yes/no questions meaningfully to indicate her immediate needs/wants.  SLP provided skilled education regarding comfort feeds and not forcing pt to eat.  Discussed how POs would likely be for comfort going forward, rather than for maintaining nutrition and hydration.  SLP attempted to elicit automatic sequence with singing tasks.  Pt listened quietly while therapist sang and subjectively appeared less restless; however, she was unable to initiate verbalization to sing in unison.   Pt indicated that she wanted to return to bed at the end of therapy session via head nod with max assist gestural and visual cues.  She was left in bed with daughter in law at bedside.  All questions answered to their satisfaction at this time.   Pt is discharging home with hospice tomorrow.      Patient has met 2 of 6 long term goals.  Patient to discharge at [facility] Max;Total level.  Reasons goals not met:     Clinical Impression/Discharge Summary:   Pt has made poor functional gains while inpatient due to decline in functional status in the setting of disease progression (dementia and stroke sequelae).  Pt has met 2 out of 6 long term goals,  although goals were downgraded to account for decline in function.  Pt is currently max-total assist for tasks due to severe receptive>expressive aphasia and significant cognitive impairment. .Family education is complete at this time.   Pt is discharging home with hospice and recommendations for ST follow up at next level of care to maximize functional communication in the hopes of improving quality of life.    Care Partner:  Caregiver Able to Provide Assistance: Yes  Type of Caregiver Assistance: Physical;Cognitive  Recommendation:  24 hour supervision/assistance;Home Health SLP;Other (comment)(hospice )  Rationale for SLP Follow Up: Maximize functional communication;Other (comment)(maximize quality of life )   Equipment: none recommended by SLP    Reasons for discharge: Discharged from hospital   Patient/Family Agrees with Progress Made and Goals Achieved: Yes   Function:  Eating Eating     Eating Assist Level: Help managing cup/glass;Set up assist for;Supervision or verbal cues;Helper scoops food on utensil;Hand over hand assist   Eating Set Up Assist For: Opening containers Helper [place] on Utensil: Every scoop     Cognition Comprehension Comprehension assist level: Understands basic less than 25% of the time/ requires cueing >75% of the time  Expression   Expression assist level: Expresses basis less than 25% of the time/requires cueing >75% of the time.  Social Interaction Social Interaction assist level: Interacts appropriately less than 25% of the time. May be withdrawn or combative.  Problem Solving Problem solving assist level: Solves basic less than 25% of the time - needs direction nearly all the  time or does not effectively solve problems and may need a restraint for safety  Memory Memory assist level: Recognizes or recalls less than 25% of the time/requires cueing greater than 75% of the time   Emilio Math 03/01/2018, 3:59 PM

## 2018-03-01 NOTE — Progress Notes (Signed)
Occupational Therapy Session Note  Patient Details  Name: [name] MRN: [id number] Date of Birth: 1934/11/24  Today's Date: 03/01/2018 OT Individual Time: 3532-9924 OT Individual Time Calculation (min): 47 min    Short Term Goals: Week 2:  OT Short Term Goal 1 (Week 2): Pt will safely transfer from w/c to toilet with consistent mod A OT Short Term Goal 2 (Week 2): Pt will complete oral hygiene with mod multimodal cues OT Short Term Goal 3 (Week 2): Pt will complete seated level LB dressing with mod  Skilled Therapeutic Interventions/Progress Updates:    Pt in bed asleep to start session.  Therapist and daughter-in-law were able to wake pt up easily with light stimulation and use of the washcloth.  She was able to wash her face using the LUE with min assist after washcloth was placed on her face.  Proceeded with supine to sit with max assist as therapist was not assisting pt with transfer.   Once sitting she was able to maintain her balance once sitting with min guard assist.  Worked on donning slip over gown and gripper socks on the EOB.  Pt needed max demonstrational cueing and overall mod assist to donn gown, with therapist assisting with donning the right and left sleeves secondary to motor planning issues and pt not understanding the sequence.  When gripper sock was placed in her left hand she proceeded to cross the right LE over the left knee and donn it using one handed technique and min assist.  Max assist for donning the left sock secondary to attempting to use the right hand only.  Max assist for stand pivot transfer to the wheelchair from the bed.  Once in the chair she brushed her hair at the sink with max hand over hand using the RUE.  Finished session with short distance mobility in the room and hallway with pt ambulating 30-35 ft with bilateral and single hand held max assist from therapist.  Daughter-in-law followed with wheelchair for safety.  Pt was left in wheelchair with  safety belt in place and daughter-in-law assisting with breakfast.    Therapy Documentation Precautions:  Precautions Precautions: Fall Precaution Comments: motor planning, expressive aphasia Restrictions Weight Bearing Restrictions: No   Pain: Pain Assessment Pain Scale: 0-10 Pain Score: 0-No pain ADL: See Function Navigator for Current Functional Status.   Therapy/Group: Individual Therapy  Merleen Picazo OTR/L 03/01/2018, 10:28 AM

## 2018-03-01 NOTE — Plan of Care (Signed)
  Problem: RH Eating Goal: LTG Patient will perform eating w/assist, cues/equip (OT) Description LTG: Patient will perform eating with assist, with/without cues using equipment (OT) Outcome: Not Met (add Reason) Note:  Pt requires max/total A for self-feeding   Problem: RH Bathing Goal: LTG Patient will bathe with assist, cues/equipment (OT) Description LTG: Patient will bathe specified number of body parts with assist with/without cues using equipment (position)  (OT) Outcome: Not Met (add Reason) Note:  Pt needs max A for bathing tasks   Problem: RH Balance Goal: LTG: Patient will maintain dynamic sitting balance (OT) Description LTG:  Patient will maintain dynamic sitting balance with assistance during activities of daily living (OT) Outcome: Completed/Met   Problem: RH Dressing Goal: LTG Patient will perform upper body dressing (OT) Description LTG Patient will perform upper body dressing with assist, with/without cues (OT). Outcome: Completed/Met   Problem: RH Functional Use of Upper Extremity Goal: LTG Patient will use RT/LT upper extremity as a (OT) Description LTG: Patient will use right/left upper extremity as a stabilizer/gross assist/diminished/nondominant/dominant level with assist, with/without cues during functional activity (OT) Outcome: Completed/Met

## 2018-03-01 NOTE — Progress Notes (Signed)
Physical Therapy Discharge Summary  Patient Details  Name: [name] MRN: [id number] Date of Birth: [birth year]  Today's Date: 03/01/2018 PT Individual Time: 9678-9381 AND 1600-1630 PT Individual Time Calculation (min): 30 min  AND 30 min  and Today's Date: 03/01/2018 PT Missed Time: (30) AND 30 min Missed Time Reason: Patient fatigue  Session 1:  Pt in supine and responding to voice, transferred to EOB w/ mod assist and maintained static sitting for 2-3 min w/ min guard while therapist set-up for w/c transfer. Transferred to w/c w/ min assist via stand pivot. Performed multiple sit<>stand transfers to don LE garments w/ min assist and 2nd helper (daughter) providing appropriate assist w/ LE garments. Total assist to thread pants and pull over hips and don UE shirt. Pt minimally initiated dressing, but following 1-step commands consistently. Transported pt to day room to increase arousal and attempt feeding task. However once in dayroom, pt unresponsive to auditory/tactile stimulation and unresponsive to sternal rub. Vitals WNL however and pt appeared to be sleeping. Transported back to room and daughter-in-law present to sit w/ pt until next therapy session or until pt wakes up. Pt posed no safety risk sitting in w/c sleeping calmly, quick release belt donned and all needs met. NT made aware of pt's status. Missed 20 min of skilled PT 2/2 fatigue.   Session 2:  Pt in supine and awake/alert, no evidence of pain. Transferred to EOB w/ max assist. Provided mod-max assist to maintain static sitting while pt participated in self-feeding yogurt w/ verbal and manual cues for initiation 50% of the time. Daughter [name]) present and stating that she had a procedure done at the doctors office today and was instructed not to do any lifting this evening. Deferred performing car transfer education w/ daughter as she has already assisted pt w/ stand pivot transfers both at pt's best and at pt's worst  functional level. Educated on car transfer technique and that it is the same technique as stand pivots from bed<>chair. Cautioned daughter on paying close attention pt's level of arousal when getting in and out of car for safety. Daughter verbalized understanding and in agreement. Answered daughter's last few questions regarding discharge recommendations and ended session in recliner and in care of daughter, all needs met. Missed 30 min of skilled PT 2/2 fatigue limiting functional participation at end of session.   Patient has met 4 of 5 long term goals due to improved activity tolerance, improved postural control, ability to compensate for deficits and improved coordination.  Patient to discharge at a wheelchair level [place].   Patient's care partner unavailable to provide the necessary physical and cognitive assistance at discharge. Pt's husband is unable to physically assist. Pt's children have hired caregiver to be present during the workday, they plan to assist w/ physical/cognitive care when caregiver is not present. All children have been educated on providing hands-on care including education regarding level of assistance needed when pt is not awake (total to dependent), energy conservation techniques to maximize participation, and w/c bumping and car transfers.  Reasons goals not met: Pt continues to consistently require max assist for bed mobility 2/2 frequent bouts of lethargy, fatigue, and impaired initiation.  Recommendation:  Patient will benefit from ongoing skilled PT services in home health setting/hospice to continue to advance safe functional mobility, address ongoing impairments in cognition, safety, functional transfers and bed mobility, endurance, and tolerance to upright activity, and minimize fall risk.  Equipment: hospital bed, w/c  Reasons for discharge: treatment  goals met and discharge from hospital  Patient/family agrees with progress made and goals achieved: Yes  PT  Discharge Precautions/Restrictions Precautions Precautions: Fall Precaution Comments: motor planning, expressive aphasia Restrictions Weight Bearing Restrictions: No Vital Signs Therapy Vitals Pulse Rate: 63 Resp: 16 BP: 117/68 Patient Position (if appropriate): Lying Oxygen Therapy SpO2: 100 % O2 Device: Room Air Pain Pain Assessment Pain Scale: Faces Faces Pain Scale: No hurt Vision/Perception  Perception Perception: Impaired Praxis Praxis: Impaired Praxis Impairment Details: Initiation;Ideomotor;Motor planning;Ideation  Cognition Overall Cognitive Status: History of cognitive impairments - at baseline Arousal/Alertness: Lethargic Orientation Level: Disoriented X4 Attention: Focused Focused Attention: Impaired Focused Attention Impairment: Functional basic;Verbal basic Sustained Attention: Impaired Selective Attention: Impaired Memory: Impaired Memory Impairment: Storage deficit;Retrieval deficit;Decreased recall of new information;Decreased short term memory Awareness: Impaired Awareness Impairment: Intellectual impairment Problem Solving: Impaired Problem Solving Impairment: Verbal basic;Functional basic Executive Function: (all impaired due to lower level deficits ) Behaviors: Restless;Impulsive;Perseveration Safety/Judgment: Impaired Sensation Sensation Light Touch: Impaired by gross assessment(difficult to assess 2/2 cognition, suspect RLE sensation deficits 2/2 diminished response to stimuli) Stereognosis: Impaired by gross assessment Proprioception: Impaired by gross assessment(impaired RLE) Proprioception Impaired Details: Impaired RUE Coordination Gross Motor Movements are Fluid and Coordinated: No Fine Motor Movements are Fluid and Coordinated: No Coordination and Movement Description: 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" Motor  Motor Motor: Hemiplegia Motor - Skilled Clinical Observations: R hemi, generalized  weakness Motor - Discharge Observations: R hemi, generalized weakness  Mobility Bed Mobility Bed Mobility: Rolling Right;Rolling Left;Supine to Sit;Sit to Supine Rolling Right: 2: Max assist Rolling Right Details: Manual facilitation for placement;Manual facilitation for weight shifting;Manual facilitation for weight bearing;Verbal cues for precautions/safety;Verbal cues for technique;Tactile cues for placement;Tactile cues for initiation Rolling Left: 2: Max assist Rolling Left Details: Tactile cues for initiation;Manual facilitation for weight shifting;Manual facilitation for weight bearing;Manual facilitation for placement;Verbal cues for precautions/safety;Tactile cues for placement;Verbal cues for technique Supine to Sit: 2: Max assist Supine to Sit Details: Manual facilitation for weight shifting;Manual facilitation for placement;Manual facilitation for weight bearing;Tactile cues for initiation;Verbal cues for technique;Verbal cues for sequencing;Tactile cues for placement Sit to Supine: 2: Max assist Sit to Supine - Details: Tactile cues for initiation;Verbal cues for technique;Verbal cues for sequencing;Tactile cues for placement;Manual facilitation for weight shifting;Manual facilitation for placement;Manual facilitation for weight bearing Transfers Transfers: Yes Sit to Stand: 4: Min assist Sit to Stand Details: Manual facilitation for placement;Tactile cues for sequencing;Tactile cues for initiation;Tactile cues for weight shifting;Tactile cues for placement;Verbal cues for safe use of DME/AE Stand to Sit: 4: Min assist Stand to Sit Details (indicate cue type and reason): Tactile cues for initiation;Manual facilitation for placement;Tactile cues for placement;Tactile cues for sequencing;Tactile cues for weight shifting Stand Pivot Transfers: 3: Mod assist Stand Pivot Transfer Details: Tactile cues for initiation;Manual facilitation for weight shifting;Manual facilitation for  placement;Manual facilitation for weight bearing;Verbal cues for sequencing;Tactile cues for placement Locomotion  Ambulation Ambulation: Yes Ambulation/Gait Assistance: 2: Max assist Ambulation Distance (Feet): 30 Feet Assistive device: 1 person hand held assist Ambulation/Gait Assistance Details: Manual facilitation for weight shifting;Manual facilitation for placement;Manual facilitation for weight bearing;Verbal cues for technique;Verbal cues for precautions/safety;Tactile cues for initiation Gait Gait velocity: decreased Stairs / Additional Locomotion Stairs: No Wheelchair Mobility Wheelchair Mobility: No  Trunk/Postural Assessment  Cervical Assessment Cervical Assessment: Exceptions to WFL(forward head) Thoracic Assessment Thoracic Assessment: Exceptions to WFL(kyphotic) Lumbar Assessment Lumbar Assessment: Exceptions to WFL(posterior pelvic tilt) Postural Control Postural Control: Deficits on evaluation(impaired 2/2 lethargy)  Balance Static  Sitting Balance Static Sitting - Level of Assistance: 2: Max assist Dynamic Sitting Balance Dynamic Sitting - Level of Assistance: 2: Max assist Static Standing Balance Static Standing - Level of Assistance: 2: Max assist Dynamic Standing Balance Dynamic Standing - Level of Assistance: 2: Max assist Extremity Assessment  RUE Assessment RUE Assessment: Exceptions to [facility] RUE AROM (degrees) Overall AROM Right Upper Extremity: Deficits;Unable to assess;Due to impaired cognition RUE Strength RUE Overall Strength Comments: Unable to fully assess 2/2 cognition, deficits in coordination, tone, sensory and proprioception-"alien arm syndrome" unable to utilize functionally  LUE Assessment LUE Assessment: Within Functional Limits RLE Assessment RLE Assessment: Exceptions to WFL(unable to formally assess 2/2 cognition, pt moving extremity against gravity) LLE Assessment LLE Assessment: Exceptions to WFL(unable to formally assess 2/2  cognition, pt moving extremity against gravity)   See Function Navigator for Current Functional Status.  Bubber Rothert K Arnette 03/01/2018, 5:11 PM

## 2018-03-01 NOTE — Progress Notes (Signed)
Subjective/Complaints: Appreciate palliative note    ROS- cannot obtain due to aphasia Objective: Vital Signs: Blood pressure 120/77, pulse 67, temperature 97.7 F (36.5 C), temperature source Oral, resp. rate 12, height 5\' 3"  (1.6 m), weight 60.1 kg (132 lb 7.9 oz), SpO2 94 %. No results found. Results for orders placed or performed during the hospital encounter of 02/13/18 (from the past 72 hour(s))  Glucose, capillary     Status: Abnormal   Collection Time: 02/26/18 11:52 AM  Result Value Ref Range   Glucose-Capillary 141 (H) 65 - 99 mg/dL  Glucose, capillary     Status: Abnormal   Collection Time: 02/26/18  4:17 PM  Result Value Ref Range   Glucose-Capillary 182 (H) 65 - 99 mg/dL   Comment 1 Notify RN   Glucose, capillary     Status: Abnormal   Collection Time: 02/26/18  9:29 PM  Result Value Ref Range   Glucose-Capillary 279 (H) 65 - 99 mg/dL  Glucose, capillary     Status: Abnormal   Collection Time: 02/27/18  6:38 AM  Result Value Ref Range   Glucose-Capillary 189 (H) 65 - 99 mg/dL  Glucose, capillary     Status: Abnormal   Collection Time: 02/27/18  4:27 PM  Result Value Ref Range   Glucose-Capillary 292 (H) 65 - 99 mg/dL   Comment 1 Notify RN   Glucose, capillary     Status: Abnormal   Collection Time: 02/27/18  9:11 PM  Result Value Ref Range   Glucose-Capillary 188 (H) 65 - 99 mg/dL   Comment 1 Notify RN   Glucose, capillary     Status: Abnormal   Collection Time: 02/28/18  6:22 AM  Result Value Ref Range   Glucose-Capillary 157 (H) 65 - 99 mg/dL  Glucose, capillary     Status: Abnormal   Collection Time: 02/28/18 12:07 PM  Result Value Ref Range   Glucose-Capillary 130 (H) 65 - 99 mg/dL   Comment 1 Notify RN   Glucose, capillary     Status: Abnormal   Collection Time: 02/28/18  4:41 PM  Result Value Ref Range   Glucose-Capillary 248 (H) 65 - 99 mg/dL  Glucose, capillary     Status: Abnormal   Collection Time: 02/28/18  9:18 PM  Result Value Ref Range   Glucose-Capillary 274 (H) 65 - 99 mg/dL  Glucose, capillary     Status: Abnormal   Collection Time: 03/01/18  6:44 AM  Result Value Ref Range   Glucose-Capillary 140 (H) 65 - 99 mg/dL     HEENT: normal Cardio: RRR and no murmur Resp: CTA B/L and no wheezes GI: BS positive and NT, ND Extremity:  No Edema Skin:   Bruise forearms Neuro: Abnormal Sensory reduced sensation to pinch Right Upper and Lower limb, Abnormal Motor 3/5 RUE and RLE, Abnormal FMC Ataxic/ dec Increased tone R elbow flexor  FMC and Aphasic Musc/Skel:  Other no pain with UE or LE ROM Gen NAD   Assessment/Plan: 1. Functional deficits secondary to Left thalamic and Left parietal infarct which require 3+ hours per day of interdisciplinary therapy in a comprehensive inpatient rehab setting. Physiatrist is providing close team supervision and 24 hour management of active medical problems listed below. Physiatrist and rehab team continue to assess barriers to discharge/monitor patient progress toward functional and medical goals. FIM: Function - Bathing Position: Bed Body parts bathed by patient: Right arm, Chest, Left upper leg Body parts bathed by helper: Right arm, Left arm, Chest, Abdomen, Front  perineal area, Buttocks, Right upper leg, Left upper leg, Right lower leg, Back, Left lower leg Bathing not applicable: Buttocks Assist Level: 2 helpers  Function- Upper Body Dressing/Undressing What is the patient wearing?: Pull over shirt/dress Pull over shirt/dress - Perfomed by patient: Thread/unthread right sleeve, Thread/unthread left sleeve, Put head through opening, Pull shirt over trunk Pull over shirt/dress - Perfomed by helper: Thread/unthread right sleeve, Thread/unthread left sleeve, Put head through opening, Pull shirt over trunk Assist Level: 2 helpers Function - Lower Body Dressing/Undressing What is the patient wearing?: Non-skid slipper socks Position: Wheelchair/chair at sink Non-skid slipper socks-  Performed by patient: Don/doff right sock Non-skid slipper socks- Performed by helper: Don/doff right sock, Don/doff left sock Assist for footwear: Dependant Assist for lower body dressing: Touching or steadying assistance (Pt > 75%)  Function - Toileting Toileting activity did not occur: No continent bowel/bladder event Toileting steps completed by patient: Performs perineal hygiene Toileting steps completed by helper: Adjust clothing prior to toileting, Performs perineal hygiene, Adjust clothing after toileting Toileting Assistive Devices: Grab bar or rail Assist level: Two helpers  Function - Air cabin crew transfer activity did not occur: Safety/medical concerns Toilet transfer assistive device: Elevated toilet seat/BSC over toilet Assist level to toilet: 2 helpers Assist level from toilet: 2 helpers Assist level to bedside commode (at bedside): Maximal assist (Pt 25 - 49%/lift and lower) Assist level from bedside commode (at bedside): Maximal assist (Pt 25 - 49%/lift and lower)  Function - Chair/bed transfer Chair/bed transfer method: Stand pivot Chair/bed transfer assist level: 2 helpers Chair/bed transfer assistive device: Armrests, Bedrails Chair/bed transfer details: Manual facilitation for placement, Manual facilitation for weight shifting, Tactile cues for sequencing, Tactile cues for initiation, Tactile cues for placement, Verbal cues for sequencing, Verbal cues for technique, Manual facilitation for weight bearing, Verbal cues for precautions/safety, Tactile cues for posture  Function - Locomotion: Wheelchair Will patient use wheelchair at discharge?: Yes Type: Manual Max wheelchair distance: 150 ft Assist Level: Dependent (Pt equals 0%)(transport) Assist Level: Dependent (Pt equals 0%) Assist Level: Dependent (Pt equals 0%) Turns around,maneuvers to table,bed, and toilet,negotiates 3% grade,maneuvers on rugs and over doorsills: No Function - Locomotion:  Ambulation Assistive device: Hand held assist Max distance: 15' Assist level: 2 helpers Assist level: 2 helpers Walk 50 feet with 2 turns activity did not occur: Safety/medical concerns Assist level: 2 helpers Walk 150 feet activity did not occur: Safety/medical concerns Walk 10 feet on uneven surfaces activity did not occur: Safety/medical concerns  Function - Comprehension Comprehension: Auditory Comprehension assist level: Understands basic 25 - 49% of the time/ requires cueing 50 - 75% of the time  Function - Expression Expression: Verbal Expression assist level: Expresses basis less than 25% of the time/requires cueing >75% of the time.  Function - Social Interaction Social Interaction assist level: Interacts appropriately less than 25% of the time. May be withdrawn or combative.  Function - Problem Solving Problem solving assist level: Solves basic less than 25% of the time - needs direction nearly all the time or does not effectively solve problems and may need a restraint for safety  Function - Memory Memory assist level: Recognizes or recalls less than 25% of the time/requires cueing greater than 75% of the time Patient normally able to recall (first 3 days only): None of the above  Medical Problem List and Plan:  1. Functional deficits secondary to infarcts in left thalamus and left parietal periventricular white matter  -admit to inpatient rehab  PT, OT, SLP  CIR level,change to 15/7  D/c to home Hospice in am    2. DVT Prophylaxis/Anticoagulation: Pharmaceutical: Lovenox and Other (comment)  3. Pain Management: tylenol prn  4. Mood: Patient currently with poor awareness of deficits. LCSW to follow for evaluation when appropriate.  -Trial of amantadine for arousal not helpful have d/ced -check sleep chart  -begin scheduled, low dose seroquel  5. Neuropsych: This patient is not capable of making decisions on her own behalf.  6. Skin/Wound Care: routine pressure relief  measures  7. Fluids/Electrolytes/Nutrition: encourage fluids, IVF overnite, until D/C   now on Megace trial 0-40% meals 8. Chronic combined systolic/diastolic CHF/chemo induced CM: Heart healthy diet. Monitor for signs of overload and check weights daily. BB discontinued due to bradycardia. Continue Lisinopril daily. Used lasix prn edema at home.  Vitals:   02/28/18 1411 03/01/18 0340  BP: (!) 148/84 120/77  Pulse: 69 67  Resp: 17 12  Temp: 97.9 F (36.6 C) 97.7 F (36.5 C)  SpO2: 100% 94%  BP controlled 4/11 9. CAD w/p PTCA: On ASA and Zetia.  10. CKD: Avoid nephrotoxic medications. Monitor with serial checks.  11.T2DM: Hgb A1C- 8.6. Blood sugars poorly controlled.--Used Lantus 80 units with metformin at nights.. Monitor BS ac/hs.Cont  Lantus  35U CBG (last 3)  Recent Labs    02/28/18 1641 02/28/18 2118 03/01/18 0644  GLUCAP 248* 274* 140*  12. Hypothyroid: On supplement.   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  Urinary retention intermittent, may have some cognitive aspect, no anticholinergic meds identified       A FACE TO FACE EVALUATION WAS PERFORMED  Charlett Blake 03/01/2018, 7:45 AM

## 2018-03-01 NOTE — Discharge Summary (Addendum)
Physician Discharge Summary  Patient ID: [name] MRN: [id number] DOB/AGE: [birth year] 83 y.o.  Admit date: 02/13/2018 Discharge date: 03/02/2018  Discharge Diagnoses:  Principal Problem:   Acute ischemic left middle cerebral artery (MCA) stroke (HCC) Active Problems:   DM (diabetes mellitus), type 2, uncontrolled with complications (HCC)   Aphasia   AKI (acute kidney injury) ([place])   Palliative care by specialist   Sundowning   Seizures ([place])   Discharged Condition:  stable  Significant Diagnostic Studies: Ct Angio Head W Or Wo Contrast  Result Date: 02/10/2018 CLINICAL DATA:  Initial evaluation for acute stroke, garbled speech. EXAM: CT ANGIOGRAPHY HEAD AND NECK TECHNIQUE: Multidetector CT imaging of the head and neck was performed using the standard protocol during bolus administration of intravenous contrast. Multiplanar CT image reconstructions and MIPs were obtained to evaluate the vascular anatomy. Carotid stenosis measurements (when applicable) are obtained utilizing NASCET criteria, using the distal internal carotid diameter as the denominator. CONTRAST:  47mL ISOVUE-370 IOPAMIDOL (ISOVUE-370) INJECTION 76% COMPARISON:  Prior CT from earlier the same day. FINDINGS: CTA NECK FINDINGS Aortic arch: Visualized aortic arch of normal caliber with normal 3 vessel morphology. Moderate atheromatous plaque throughout the visualized arch. No high-grade stenosis at the origin of the great vessels. Short-segment approximate 50% stenosis at the proximal left subclavian artery (series 10, image 273). Visualized subclavian arteries otherwise patent. Note made of an additional short-segment moderate stenosis at the left axillary artery (series 10, image 290). Right carotid system: Right common carotid artery widely patent from its origin to the bifurcation without stenosis. Atheromatous plaque about the proximal left ICA right ICA with associated stenosis of up to approximately 60% by NASCET  criteria. Exact delineation somewhat difficult given motion artifact through this region. Right ICA widely patent distally to the skull base without stenosis, dissection or occlusion. Left carotid system: Left common carotid artery patent from its origin to the bifurcation without significant stenosis. Bulky calcified plaque about the left bifurcation with associated stenosis of up to approximately 60% by NASCET criteria. Again, evaluation somewhat limited by motion artifact. Left ICA patent distally to the skull base without stenosis, dissection, or occlusion. Vertebral arteries: Both of the vertebral arteries arise from the subclavian arteries. Focal plaque at the origin of the vertebral arteries bilaterally with secondary moderate narrowing on the left and more mild narrowing on the right. Right vertebral artery demonstrates scattered atheromatous irregularity but is patent within the neck without high-grade stenosis. There is focal severe proximal left V2 stenosis at the level of C6-7 (series 10, image 236). Multifocal irregularity with additional severe stenosis just distally at the level of C5 (series 10, image 222). This is likely due to extrinsic compression from uncovertebral disease. Left vertebral artery otherwise patent to the skull base. Skeleton: No acute osseus abnormality. No worrisome lytic or blastic osseous lesions identified. Moderate degenerate spondylolysis with facet arthrosis noted within cervical spine. Other neck: No acute soft tissue abnormality within the neck. No adenopathy. Salivary glands normal. Thyroid within normal limits. Upper chest: Visualized upper chest demonstrates no acute abnormality. Scattered atelectatic changes seen throughout the lungs. Advanced 3 vessel coronary artery calcifications noted. Median sternotomy wires noted. Review of the MIP images confirms the above findings CTA HEAD FINDINGS Anterior circulation: Pre cavernous petrous segments widely patent bilaterally.  Smooth atheromatous plaque throughout the cavernous/supraclinoid ICAs with moderate diffuse narrowing, most pronounced at the supraclinoid right ICA. ICA termini widely patent. A1 segments patent bilaterally. Normal anterior communicating artery. Atheromatous irregularity within the anterior  cerebral arteries without high-grade stenosis. M1 segments patent without high-grade stenosis. No proximal M2 occlusion. Distal left MCA branches are attenuated as compared to the right, likely related to chronic left MCA territory infarct. Extensive small vessel atheromatous irregularity noted. Posterior circulation: Vertebral arteries demonstrate multifocal atheromatous irregularity without high-grade flow-limiting stenosis. Right vertebral artery slightly dominant. Partially visualized posterior inferior cerebral arteries patent bilaterally. Atheromatous irregularity throughout the basilar artery without high-grade stenosis. Superior cerebral arteries patent proximally. Both of the posterior cerebral arteries primarily supplied via the basilar. PCAs demonstrate extensive atheromatous irregularity bilaterally. Multifocal moderate to severe P1 and P2 stenoses, greater on the right. PCAs are patent to their distal aspects. Venous sinuses: Grossly patent, although not well evaluated due to timing of the contrast bolus. Anatomic variants: None significant. No intracranial aneurysm or vascular abnormality. Delayed phase: No pathologic enhancement. Review of the MIP images confirms the above findings IMPRESSION: 1. Negative CTA for large vessel occlusion. 2. Atheromatous stenoses of approximately 60% about the carotid bifurcations/proximal ICAs bilaterally. 3. Attenuation of the distal left MCA branches, most likely related to chronic left MCA territory infarct. 4. Moderate to severe left V1 and V2 stenoses as above. 5. Advanced intracranial atherosclerotic disease throughout the remaining intracranial circulation, most notable  within the carotid siphons, right worse than left. Electronically Signed   By: Jeannine Boga M.D.   On: 02/10/2018 02:50   Dg Chest 1 View  Result Date: 02/19/2018 CLINICAL DATA:  Recent seizure activity EXAM: CHEST  1 VIEW COMPARISON:  02/15/2018 FINDINGS: Cardiac shadow is stable. Postoperative changes are again seen. The lungs are well aerated bilaterally. No focal infiltrate or sizable effusion is seen. No bony abnormality is noted. IMPRESSION: No active disease. Electronically Signed   By: Inez Catalina M.D.   On: 02/19/2018 12:42   Dg Chest 1 View  Result Date: 02/15/2018 CLINICAL DATA:  Fever and confusion. The patient was unable to tolerate positioning for the lateral view. EXAM: CHEST  1 VIEW COMPARISON:  PA and lateral chest x-ray of February 09, 2018. FINDINGS: The lungs are less well inflated today. The interstitial markings are coarse. There is mild elevation of the left hemidiaphragm with gaseous distention of bowel or the stomach deep to it. The heart is normal in size. The pulmonary vascularity is not clearly engorged. There are post CABG changes. There is calcification in the wall of the aortic arch. IMPRESSION: Mild bilateral hypoinflation which accentuates the interstitial markings. I cannot exclude a pneumonitis type process or low-grade interstitial edema. No alveolar pneumonia. Electronically Signed   By: David  Martinique M.D.   On: 02/15/2018 12:35    Labs:  Basic Metabolic Panel: BMP Latest Ref Rng & Units 02/26/2018 02/22/2018 02/20/2018  Glucose 65 - 99 mg/dL 126(H) 172(H) 193(H)  BUN 6 - 20 mg/dL 25(H) 21(H) 22(H)  Creatinine 0.44 - 1.00 mg/dL 1.40(H) 1.17(H) 1.20(H)  Sodium 135 - 145 mmol/L 137 136 136  Potassium 3.5 - 5.1 mmol/L 4.2 4.4 4.7  Chloride 101 - 111 mmol/L 103 105 104  CO2 22 - 32 mmol/L 23 20(L) 23  Calcium 8.9 - 10.3 mg/dL 9.4 9.3 8.7(L)    CBC: CBC Latest Ref Rng & Units 02/26/2018 02/20/2018 02/19/2018  WBC 4.0 - 10.5 K/uL 7.8 9.6 9.3  Hemoglobin 12.0 - 15.0  g/dL 14.6 13.7 15.3(H)  Hematocrit 36.0 - 46.0 % 43.0 40.4 44.4  Platelets 150 - 400 K/uL 262 204 208    CBG: Recent Labs  Lab 02/28/18 2118 03/01/18 0644 03/01/18  1138 03/01/18 2148 03/02/18 0657  GLUCAP 274* 140* 190* 360* 150*    Brief HPI:   CHARLA CRISCIONE is a 83 y.o. female with history of CAD, chemo induced CM, prior stroke, dementia who was admitted on 02/09/18 with confusion and right sided weakness.  MRI brain showing limited by motion artifact but showed possible acute early subacute infarcts in left thalamus and left parietal periventricular white matter. 2D echo showed EF 25% with septal, Mild AS and apical akinesis of inferior wall. EEG done due to ongoing issues with confusion and agitation a and showed intermittent frontal slowing. Dr. Erlinda Hong recommended starting [place] on 3/26 with repeat echo in 2-3 months and anticoagulation can be d/ced if EF > 35%. BLE dopplers were negative for DVT. She was treated with IV antibiotics X 3 days due to concerns of UTI. Has required sitters due to fall risk and safety concerns. Has issues with day night disruption. Palliative care consulted for [place] and family elected on DNR. Patient with resultant global aphasia, bouts of lethargy with agitation, left gaze preference, right sided weakness affecting mobility and self care tasks. CIR recommended due to functional deficits.    Hospital Course: GENOLA YUILLE was admitted to rehab 02/13/2018 for inpatient therapies to consist of PT, ST and OT at least three hours five days a week. Past admission physiatrist, therapy team and rehab RN have worked together to provide customized collaborative inpatient rehab. Eliquis 2.5 mg bid was started on 3/27 per family input. She continued to be limited by sleep wake disruption and sundowning. Low dose Seroquel was added to help with sleep cycle. She was making some progress till 04/01 when she developed 2 episodes of focal seizure activity. Follow up CT head was  negative for bleed or new changes. EEG showed moderate diffuse slowing with focal slowing over left temporal region but no clear epileptiform discharges. She was started on low dose Keppra and has been seizure free on this.  She was noted to be post ictal and lethargic and was treated with IVF for hydration due to poor intake.    She continued to have lethargy with short episodes of activation. Megace was added to help stimulate appetite. Her blood sugars have varied according to intake and Lantus has been titrated to 35 units. Trial of amantadine was ineffective therefore this was discontinued. She has had issues with urinary retention therefore indewelling foley was placed to prevent trauma from I/O caths.  Hospice was reconsulted to help family establish GOC and for follow up after discharge. Family declined SNF and opted to take patient home with Hospice care. She will continue to benefit from follow up [place] [place] and [place] after discharge. She was discharged to home at mod to max assist.    Rehab course: During patient's stay in rehab weekly team conferences were held to monitor patient's progress, set goals and discuss barriers to discharge. At admission,  patient required Mod assist with mobility and min assist with ADL tasks. She exhibited Wernicke's aphasia along with  severe receptive and expressive deficit with inability to answer Y/N questions and verbal output was limited to jargon without awareness of errors.  She has had functional decline during her stay since seizure activity. Length of stay was extended to help complete family education. She requires max assist with ADL tasks and able to feed self with verbal and manual cues. She is able to stand with min assist and needs mod to max assist transfers. Family education was  completed regarding all aspects of care. She requires max to total assist with tasks due to severe aphasia and significant cognitive impairment. Family has hired Astronomer for  assistance during the day and family will assist at nights. d improvement in activity tolerance, balance, postural control as well as ability to compensate for deficits.    Disposition:  Home   Diet: As tolerated--offer supplements throughout the day when alert.   Special Instructions: 1. Foley care twice a day. 2. [facility] to provide PT, OT, ST, Aide, Therapist, sports and SW.    Allergies as of 03/02/2018      Reactions   Brilinta [ticagrelor] Shortness Of Breath   Plavix [clopidogrel Bisulfate] Other (See Comments)   NOT A RESPONDER- P2Y12 ASSAY WAS 224 (09/02/14)   Statins Other (See Comments)   Lethargy, muscle pain      Medication List    STOP taking these medications   aspirin 325 MG EC tablet   insulin aspart 100 UNIT/ML injection Commonly known as:  novoLOG     TAKE these medications   acetaminophen 325 MG tablet Commonly known as:  TYLENOL Take 2 tablets (650 mg total) by mouth every 6 (six) hours as needed for mild pain or headache. What changed:    when to take this  reasons to take this   apixaban 2.5 MG Tabs tablet Commonly known as:  ELIQUIS Take 1 tablet (2.5 mg total) by mouth 2 (two) times daily.   ezetimibe 10 MG tablet Commonly known as:  ZETIA Take 1 tablet (10 mg total) by mouth daily.   flavoxATE 100 MG tablet Commonly known as:  URISPAS Take 1 tablet (100 mg total) by mouth 3 (three) times daily as needed for bladder spasms.   insulin glargine 100 UNIT/ML injection Commonly known as:  LANTUS Inject 0.35 mLs (35 Units total) into the skin at bedtime.   ketotifen 0.025 % ophthalmic solution Commonly known as:  ZADITOR Place 1 drop into both eyes 2 (two) times daily.   levETIRAcetam 250 MG tablet Commonly known as:  KEPPRA Take 1 tablet (250 mg total) by mouth 2 (two) times daily.   levothyroxine 75 MCG tablet Commonly known as:  SYNTHROID, LEVOTHROID Take 1 tablet (75 mcg total) by mouth daily before breakfast.   lip balm  Oint Apply 1 application topically as needed for lip care.   lisinopril 5 MG tablet Commonly known as:  PRINIVIL,ZESTRIL Take 1 tablet (5 mg total) by mouth daily.   MUSCLE RUB 10-15 % Crea Apply 1 application topically as needed for muscle pain.   nitroGLYCERIN 0.4 MG SL tablet Commonly known as:  NITROSTAT Place 1 tablet (0.4 mg total) under the tongue every 5 (five) minutes x 3 doses as needed for chest pain.   polyethylene glycol packet Commonly known as:  MIRALAX / GLYCOLAX Take 17 g by mouth 2 (two) times daily. Can adjust to one to two times a day for constipation   polyvinyl alcohol 1.4 % ophthalmic solution Commonly known as:  LIQUIFILM TEARS Place 1 drop into both eyes as needed for dry eyes.   senna 8.6 MG Tabs tablet Commonly known as:  SENOKOT Take 2 tablets (17.2 mg total) by mouth daily.      Follow-up Information    Kirsteins, Luanna Salk, MD. Call.   Specialty:  Physical Medicine and Rehabilitation Why:  as needed Contact information: [place] [address] [phone]        Rosalin Hawking,  MD. Call.   Specialty:  Neurology Why:  as needed Contact information: [address] [place] [address] [phone]        Leonie Man, MD. Call.   Specialty:  Cardiology Why:  as needed Contact information: [address] [place] [address] [phone]        Cyndi Bender, PA-C Follow up on 03/13/2018.   Specialty:  Physician Assistant Why:  Appointment @ 2:30 PM Contact information: [place] [address] [place], [place] Follow up.   Specialty:  [place] Why:  will follow up for assistance Contact information: [place] [address] [phone]           Signed: Bary Leriche 03/03/2018, 10:51 AM

## 2018-03-01 NOTE — Progress Notes (Signed)
Occupational Therapy Note  Patient Details  Name: AHTZIRY SAATHOFF MRN: [id number] Date of Birth: 1935/03/18  Today's Date: 03/01/2018 OT Missed Time: 15 Minutes Missed Time Reason: Patient fatigue  Pt missed 45 mins of skilled OT 2/2 lethargy and difficulty waking pt. No family present to provide family education regarding d.c tomorrow. OT will follow up per plan of care.   Daneen Schick Atif Chapple 03/01/2018, 3:35 PM

## 2018-03-01 NOTE — Progress Notes (Signed)
Occupational Therapy Discharge Summary  Patient Details  Name: [name] MRN: [id number] Date of Birth: [birth year]  Patient has met 3 of 6 long term goals. Patient to discharge at [facility] Max Assist level.  Patient's care partner is independent to provide the necessary physical and cognitive assistance at discharge. Plan for hired CNA, hospice care, and family to assist at [place].    Reasons goals not met: Patient experienced functional decline during rehab stay 2/2 seizure activity that greatly affected pt's ability to progress in therapy as originally expected upon evaluation. Family has opted to take pt home on hospice where she will still benefit from continued therapy to help family training within home environment.    Pt requires max/total A for self-feeding. Pt requires Max A for grooming tasks. Pt requires Max A for bathing tasks.  Recommendation:  Patient will benefit from ongoing skilled OT services in home health setting to continue to advance functional skills in the area of Reduce care partner burden.  Equipment: Hospital bed, wheelchair (see PT note for measurements), 3-in-1 BSC, shower chair  Reasons for discharge: discharge from hospital  Patient/family agrees with progress made and goals achieved: Yes  OT Discharge Precautions/Restrictions  Precautions Precautions: Fall Precaution Comments: motor planning, expressive aphasia Restrictions Weight Bearing Restrictions: No Pain: none ADL ADL ADL Comments: Please see functional navigator Perception  Perception: Impaired Praxis Praxis: Impaired Praxis Impairment Details: Initiation;Ideomotor;Motor planning;Ideation Cognition Overall Cognitive Status: History of cognitive impairments - at baseline Arousal/Alertness: Lethargic Orientation Level: Disoriented X4 Safety/Judgment: Impaired Sensation Sensation Light Touch: Impaired by gross assessment Stereognosis: Impaired by gross assessment Proprioception:  Impaired Detail Proprioception Impaired Details: Impaired RUE Coordination Gross Motor Movements are Fluid and Coordinated: No Fine Motor Movements are Fluid and Coordinated: No Coordination and Movement Description: 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" Motor  Motor Motor: Hemiplegia;Abnormal tone Motor - Discharge Observations: Continues to have R hemiparesis with R coordination deficts  Mobility  Transfers Sit to Stand: 2: Max assist Stand to Sit: 2: Max assist  Balance Static Sitting Balance Static Sitting - Level of Assistance: 5: Stand by assistance Dynamic Sitting Balance Dynamic Sitting - Level of Assistance: 4: Min assist Static Standing Balance Static Standing - Level of Assistance: 2: Max assist Dynamic Standing Balance Dynamic Standing - Level of Assistance: 2: Max assist Extremity/Trunk Assessment RUE Assessment RUE Assessment: Exceptions to [facility] RUE AROM (degrees) Overall AROM Right Upper Extremity: Deficits;Unable to assess;Due to impaired cognition RUE Strength RUE Overall Strength Comments: Unable to fully assess 2/2 cognition, deficits in coordination, tone, sensory and proprioception-"alien arm syndrome" unable to utilize functionally  LUE Assessment LUE Assessment: Within Functional Limits  See Function Navigator for Current Functional Status.  [name] [name] 03/01/2018, 3:33 PM

## 2018-03-02 LAB — GLUCOSE, CAPILLARY: GLUCOSE-CAPILLARY: 150 mg/dL — AB (ref 65–99)

## 2018-03-02 NOTE — Progress Notes (Signed)
Subjective/Complaints: Family excited to take patient home.  Patient is awake and alert this morning.  They have not tried car transfers.  They have tried bumping the wheelchair up to steps which they have at home    ROS- cannot obtain due to aphasia Objective: Vital Signs: Blood pressure 130/74, pulse 68, temperature 97.8 F (36.6 C), temperature source Oral, resp. rate 18, height 5\' 3"  (1.6 m), weight 60.1 kg (132 lb 7.9 oz), SpO2 100 %. No results found. Results for orders placed or performed during the hospital encounter of 02/13/18 (from the past 72 hour(s))  Glucose, capillary     Status: Abnormal   Collection Time: 02/27/18  4:27 PM  Result Value Ref Range   Glucose-Capillary 292 (H) 65 - 99 mg/dL   Comment 1 Notify RN   Glucose, capillary     Status: Abnormal   Collection Time: 02/27/18  9:11 PM  Result Value Ref Range   Glucose-Capillary 188 (H) 65 - 99 mg/dL   Comment 1 Notify RN   Glucose, capillary     Status: Abnormal   Collection Time: 02/28/18  6:22 AM  Result Value Ref Range   Glucose-Capillary 157 (H) 65 - 99 mg/dL  Glucose, capillary     Status: Abnormal   Collection Time: 02/28/18 12:07 PM  Result Value Ref Range   Glucose-Capillary 130 (H) 65 - 99 mg/dL   Comment 1 Notify RN   Glucose, capillary     Status: Abnormal   Collection Time: 02/28/18  4:41 PM  Result Value Ref Range   Glucose-Capillary 248 (H) 65 - 99 mg/dL  Glucose, capillary     Status: Abnormal   Collection Time: 02/28/18  9:18 PM  Result Value Ref Range   Glucose-Capillary 274 (H) 65 - 99 mg/dL  Glucose, capillary     Status: Abnormal   Collection Time: 03/01/18  6:44 AM  Result Value Ref Range   Glucose-Capillary 140 (H) 65 - 99 mg/dL  Glucose, capillary     Status: Abnormal   Collection Time: 03/01/18 11:38 AM  Result Value Ref Range   Glucose-Capillary 190 (H) 65 - 99 mg/dL  Glucose, capillary     Status: Abnormal   Collection Time: 03/01/18  9:48 PM  Result Value Ref Range    Glucose-Capillary 360 (H) 65 - 99 mg/dL  Glucose, capillary     Status: Abnormal   Collection Time: 03/02/18  6:57 AM  Result Value Ref Range   Glucose-Capillary 150 (H) 65 - 99 mg/dL   Comment 1 Notify RN      HEENT: normal Cardio: RRR and no murmur Resp: CTA B/L and no wheezes GI: BS positive and NT, ND Extremity:  No Edema Skin:   Bruise forearms Neuro: Abnormal Sensory reduced sensation to pinch Right Upper and Lower limb, Abnormal Motor 3/5 RUE and RLE, Abnormal FMC Ataxic/ dec Increased tone R elbow flexor  FMC and Aphasic Musc/Skel:  Other no pain with UE or LE ROM Gen NAD   Assessment/Plan: 1. Functional deficits secondary to Left thalamic and Left parietal infarct  Stable for D/C today F/u PCP in 3-4 weeks F/u PM&R 2 weeks See D/C summary See D/C instructions FIM: Function - Bathing Position: Bed Body parts bathed by patient: Right arm, Chest, Left upper leg Body parts bathed by helper: Right arm, Left arm, Chest, Abdomen, Front perineal area, Buttocks, Right upper leg, Left upper leg, Right lower leg, Back, Left lower leg Bathing not applicable: Buttocks Assist Level: 2 helpers  Function- Upper Body Dressing/Undressing What is the patient wearing?: Pull over shirt/dress Pull over shirt/dress - Perfomed by patient: Put head through opening, Pull shirt over trunk Pull over shirt/dress - Perfomed by helper: Thread/unthread right sleeve, Thread/unthread left sleeve Assist Level: 2 helpers Function - Lower Body Dressing/Undressing What is the patient wearing?: Non-skid slipper socks Position: Sitting EOB Non-skid slipper socks- Performed by patient: Don/doff right sock Non-skid slipper socks- Performed by helper: Don/doff right sock, Don/doff left sock Assist for footwear: Partial/moderate assist Assist for lower body dressing: Touching or steadying assistance (Pt > 75%)  Function - Toileting Toileting activity did not occur: No continent bowel/bladder  event Toileting steps completed by patient: Performs perineal hygiene Toileting steps completed by helper: Performs perineal hygiene, Adjust clothing prior to toileting, Adjust clothing after toileting Toileting Assistive Devices: Grab bar or rail Assist level: Two helpers  Function - Air cabin crew transfer activity did not occur: Safety/medical concerns Toilet transfer assistive device: Bedside commode Assist level to toilet: 2 helpers Assist level from toilet: 2 helpers Assist level to bedside commode (at bedside): Maximal assist (Pt 25 - 49%/lift and lower) Assist level from bedside commode (at bedside): Maximal assist (Pt 25 - 49%/lift and lower)  Function - Chair/bed transfer Chair/bed transfer method: Stand pivot Chair/bed transfer assist level: Moderate assist (Pt 50 - 74%/lift or lower) Chair/bed transfer assistive device: Armrests, Bedrails Chair/bed transfer details: Manual facilitation for placement, Manual facilitation for weight shifting, Tactile cues for sequencing, Tactile cues for initiation, Tactile cues for placement, Verbal cues for sequencing, Verbal cues for technique, Manual facilitation for weight bearing, Verbal cues for precautions/safety, Tactile cues for posture  Function - Locomotion: Wheelchair Will patient use wheelchair at discharge?: Yes(Dependent use of w/c) Type: Manual Wheelchair activity did not occur: Safety/medical concerns Max wheelchair distance: 150 ft Assist Level: Dependent (Pt equals 0%)(transport) Wheel 50 feet with 2 turns activity did not occur: Safety/medical concerns Assist Level: Dependent (Pt equals 0%) Wheel 150 feet activity did not occur: Safety/medical concerns Assist Level: Dependent (Pt equals 0%) Turns around,maneuvers to table,bed, and toilet,negotiates 3% grade,maneuvers on rugs and over doorsills: No Function - Locomotion: Ambulation Assistive device: Hand held assist Max distance: 30' Assist level: Maximal  assist (Pt 25 - 49%) Assist level: Maximal assist (Pt 25 - 49%) Walk 50 feet with 2 turns activity did not occur: Safety/medical concerns Assist level: 2 helpers Walk 150 feet activity did not occur: Safety/medical concerns Walk 10 feet on uneven surfaces activity did not occur: Safety/medical concerns  Function - Comprehension Comprehension: Auditory Comprehension assist level: Understands basic less than 25% of the time/ requires cueing >75% of the time  Function - Expression Expression: Verbal Expression assist level: Expresses basis less than 25% of the time/requires cueing >75% of the time.  Function - Social Interaction Social Interaction assist level: Interacts appropriately less than 25% of the time. May be withdrawn or combative.  Function - Problem Solving Problem solving assist level: Solves basic less than 25% of the time - needs direction nearly all the time or does not effectively solve problems and may need a restraint for safety  Function - Memory Memory assist level: Recognizes or recalls less than 25% of the time/requires cueing greater than 75% of the time Patient normally able to recall (first 3 days only): None of the above  Medical Problem List and Plan:  1. Functional deficits secondary to infarcts in left thalamus and left parietal periventricular white matter  -admit to inpatient rehab  PT, OT,  SLP CIR level,change to 15/7  D/c to home Hospice today   2. DVT Prophylaxis/Anticoagulation: Pharmaceutical: Lovenox and Other (comment)  3. Pain Management: tylenol prn  4. Mood: Patient currently with poor awareness of deficits. LCSW to follow for evaluation when appropriate.  -Trial of amantadine for arousal not helpful have d/ced -check sleep chart  -begin scheduled, low dose seroquel  5. Neuropsych: This patient is not capable of making decisions on her own behalf.  6. Skin/Wound Care: routine pressure relief measures  7. Fluids/Electrolytes/Nutrition: DC IV  fluids, family will try to encourage p.o. they do not desire a feeding tube based on conversations with palliative care 8. Chronic combined systolic/diastolic CHF/chemo induced CM: Heart healthy diet. Monitor for signs of overload and check weights daily. BB discontinued due to bradycardia. Continue Lisinopril daily. Used lasix prn edema at home.  Vitals:   03/01/18 1357 03/02/18 0432  BP: 117/68 130/74  Pulse: 63 68  Resp: 16 18  Temp:  97.8 F (36.6 C)  SpO2: 100% 100%  BP controlled 4/11 9. CAD w/p PTCA: On ASA and Zetia.  10. CKD: Avoid nephrotoxic medications. Monitor with serial checks.  11.T2DM: Hgb A1C- 8.6. Blood sugars poorly controlled.--Used Lantus 80 units with metformin at nights.. Monitor BS ac/hs.Cont  Lantus  35U CBG (last 3)  Recent Labs    03/01/18 1138 03/01/18 2148 03/02/18 0657  GLUCAP 190* 360* 150*  12. Hypothyroid: On supplement.   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  Urinary retention intermittent, may have some cognitive aspect, no anticholinergic meds identified       A FACE TO FACE EVALUATION WAS PERFORMED  Charlett Blake 03/02/2018, 9:11 AM

## 2018-03-02 NOTE — Progress Notes (Signed)
Social Work  Discharge Note  The overall goal for the admission was met for:   Discharge location: [place]  Length of Stay: Yes-17 DAYS  Discharge activity level: No-MOD/MAX ASSIST  Home/community participation: Yes  Services provided included: MD, RD, PT, OT, SLP, RN, CM, Pharmacy and SW  Financial Services: Private Insurance: [employer]  Follow-up services arranged: DME: [facility], WHEELCHAIR, [place], Other: [facility] and Patient/Family request agency HH: PREF AGENCY, DME: HOSPICE CONTRACTED WITH  Comments (or additional information):FAMILY TO HONOR PT AND HUSBAND WISHES TO [place]. AWARE WILL REQUIRE 24 HR CARE AND [name] HAS ARRANGED FOR THIS. FAMILY TRAINING COMPLETED AND PREPARED FOR GOING HOME.  Patient/Family verbalized understanding of follow-up arrangements: Yes  Individual responsible for coordination of the follow-up plan: [name]-DAUGHTER  Confirmed correct DME delivered: Elease Hashimoto 03/02/2018    Teo Moede, [place]

## 2018-04-03 ENCOUNTER — Ambulatory Visit: Payer: Medicare Other | Admitting: Neurology

## 2018-06-21 DEATH — deceased

## 2019-06-10 IMAGING — CT CT ANGIO NECK
1 of 8 series · 6 of 33 positions shown · IV contrast (OMNI 350)
Comparison: Prior CT from earlier the same day.

CLINICAL DATA: Initial evaluation for acute stroke, garbled speech.

EXAM:
CT ANGIOGRAPHY HEAD AND NECK
TECHNIQUE: Multidetector CT imaging of the head and neck was performed using
the standard protocol during bolus administration of intravenous
contrast. Multiplanar CT image reconstructions and MIPs were
obtained to evaluate the vascular anatomy. Carotid stenosis
measurements (when applicable) are obtained utilizing NASCET
criteria, using the distal internal carotid diameter as the
denominator.
CONTRAST:  50mL DAT8QP-XLW IOPAMIDOL (DAT8QP-XLW) INJECTION 76%

[Series 10: cta neck axial · axial · 0.51mm/px · z∈[-299,-41]mm · 6 of 362 slices shown]
[im 52/362  soft-tissue]
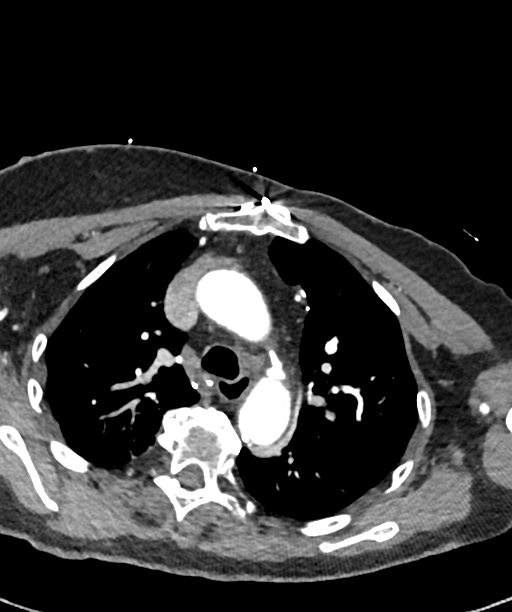
[im 104/362  bone]
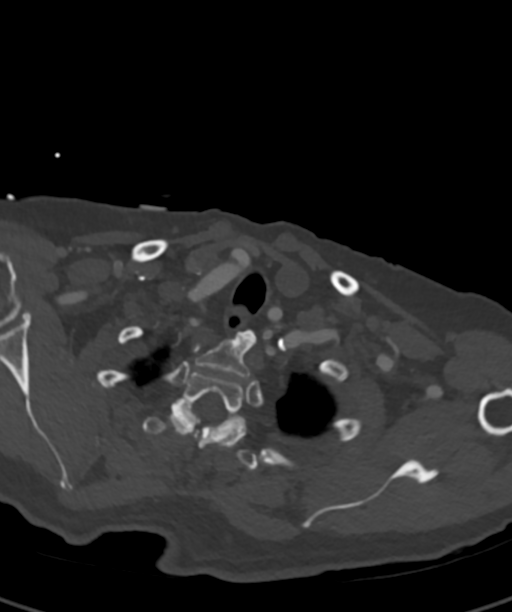
[im 155/362  soft-tissue]
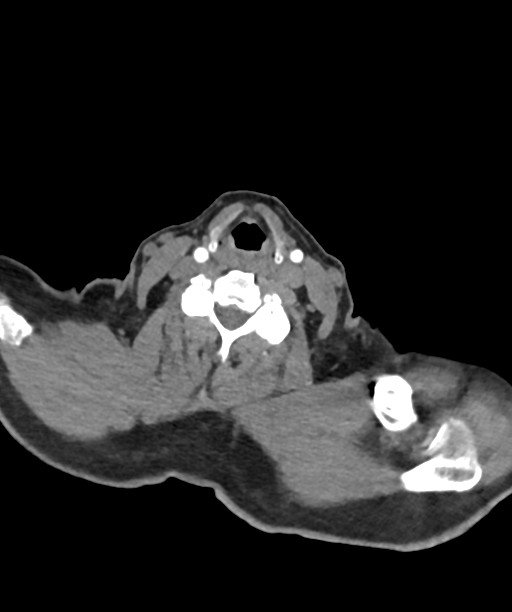
[im 207/362  bone]
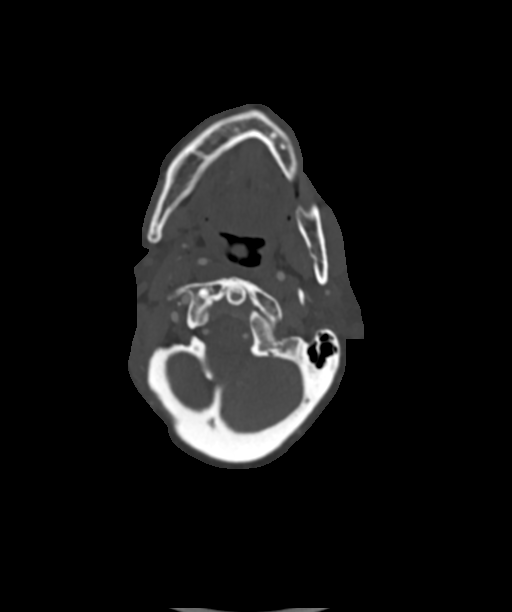
[im 258/362  soft-tissue]
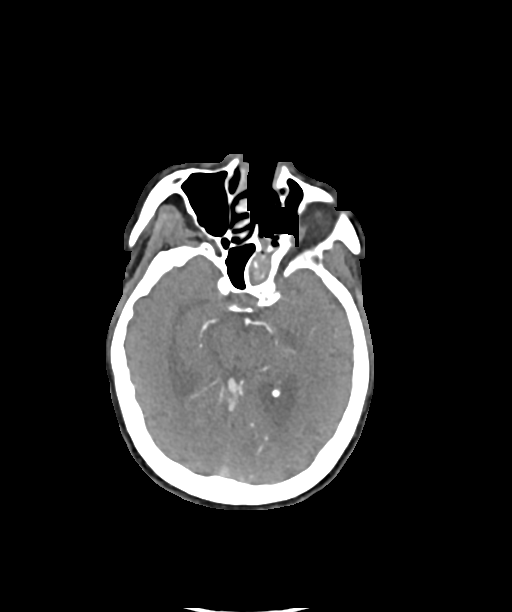
[im 310/362  bone]
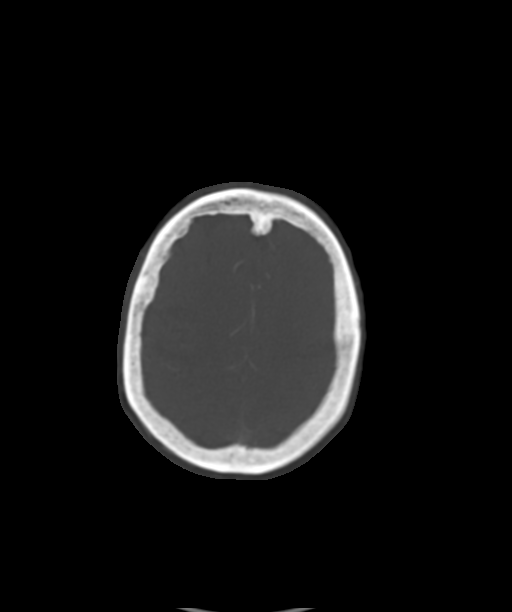

[6 of 33 positions shown; findings below may reference images not displayed]

FINDINGS: CTA NECK FINDINGS

Aortic arch: Visualized aortic arch of normal caliber with normal 3
vessel morphology. Moderate atheromatous plaque throughout the
visualized arch. No high-grade stenosis at the origin of the great
vessels. Short-segment approximate 50% stenosis at the proximal left
subclavian artery (series 10, image 273). Visualized subclavian
arteries otherwise patent. Note made of an additional short-segment
moderate stenosis at the left axillary artery (series 10, image
290).

Right carotid system: Right common carotid artery widely patent from
its origin to the bifurcation without stenosis. Atheromatous plaque
about the proximal left ICA right ICA with associated stenosis of up
to approximately 60% by NASCET criteria. Exact delineation somewhat
difficult given motion artifact through this region. Right ICA
widely patent distally to the skull base without stenosis,
dissection or occlusion.

Left carotid system: Left common carotid artery patent from its
origin to the bifurcation without significant stenosis. Bulky
calcified plaque about the left bifurcation with associated stenosis
of up to approximately 60% by NASCET criteria. Again, evaluation
somewhat limited by motion artifact. Left ICA patent distally to the
skull base without stenosis, dissection, or occlusion.

Vertebral arteries: Both of the vertebral arteries arise from the
subclavian arteries. Focal plaque at the origin of the vertebral
arteries bilaterally with secondary moderate narrowing on the left
and more mild narrowing on the right. Right vertebral artery
demonstrates scattered atheromatous irregularity but is patent
within the neck without high-grade stenosis. There is focal severe
proximal left V2 stenosis at the level of C6-7 (series 10, image
236). Multifocal irregularity with additional severe stenosis just
distally at the level of C5 (series 10, image 222). This is likely
due to extrinsic compression from uncovertebral disease. Left
vertebral artery otherwise patent to the skull base..

Skeleton: No acute osseus abnormality. No worrisome lytic or blastic
osseous lesions identified. Moderate degenerate spondylolysis with
facet arthrosis noted within cervical spine.

Other neck: No acute soft tissue abnormality within the neck. No
adenopathy. Salivary glands normal. Thyroid within normal limits.

Upper chest: Visualized upper chest demonstrates no acute
abnormality. Scattered atelectatic changes seen throughout the
lungs. Advanced 3 vessel coronary artery calcifications noted.
Median sternotomy wires noted.

Review of the MIP images confirms the above findings

CTA HEAD FINDINGS

Anterior circulation: Pre cavernous petrous segments widely patent
bilaterally. Smooth atheromatous plaque throughout the
cavernous/supraclinoid ICAs with moderate diffuse narrowing, most
pronounced at the supraclinoid right ICA. ICA termini widely patent.
A1 segments patent bilaterally. Normal anterior communicating
artery. Atheromatous irregularity within the anterior cerebral
arteries without high-grade stenosis.

M1 segments patent without high-grade stenosis. No proximal M2
occlusion. Distal left MCA branches are attenuated as compared to
the right, likely related to chronic left MCA territory infarct.
Extensive small vessel atheromatous irregularity noted.

Posterior circulation: Vertebral arteries demonstrate multifocal
atheromatous irregularity without high-grade flow-limiting stenosis.
Right vertebral artery slightly dominant. Partially visualized
posterior inferior cerebral arteries patent bilaterally.
Atheromatous irregularity throughout the basilar artery without
high-grade stenosis. Superior cerebral arteries patent proximally.
Both of the posterior cerebral arteries primarily supplied via the
basilar. PCAs demonstrate extensive atheromatous irregularity
bilaterally. Multifocal moderate to severe P1 and P2 stenoses,
greater on the right. PCAs are patent to their distal aspects.

Venous sinuses: Grossly patent, although not well evaluated due to
timing of the contrast bolus.

Anatomic variants: None significant. No intracranial aneurysm or
vascular abnormality.

Delayed phase: No pathologic enhancement.

Review of the MIP images confirms the above findings
IMPRESSION: 1. Negative CTA for large vessel occlusion.
2. Atheromatous stenoses of approximately 60% about the carotid
bifurcations/proximal ICAs bilaterally.
3. Attenuation of the distal left MCA branches, most likely related
to chronic left MCA territory infarct.
4. Moderate to severe left V1 and V2 stenoses as above.
5. Advanced intracranial atherosclerotic disease throughout the
remaining intracranial circulation, most notable within the carotid
siphons, right worse than left.
